# Patient Record
Sex: Female | Born: 1940
Health system: Southern US, Community
[De-identification: ages and names within clinical notes are randomized; demographics above are authoritative.]

## PROBLEM LIST (undated history)

## (undated) DIAGNOSIS — L570 Actinic keratosis: Secondary | ICD-10-CM

## (undated) DIAGNOSIS — IMO0001 Reserved for inherently not codable concepts without codable children: Secondary | ICD-10-CM

## (undated) DIAGNOSIS — R011 Cardiac murmur, unspecified: Secondary | ICD-10-CM

## (undated) DIAGNOSIS — Z794 Long term (current) use of insulin: Secondary | ICD-10-CM

## (undated) DIAGNOSIS — E119 Type 2 diabetes mellitus without complications: Secondary | ICD-10-CM

## (undated) DIAGNOSIS — D649 Anemia, unspecified: Secondary | ICD-10-CM

## (undated) DIAGNOSIS — E11319 Type 2 diabetes mellitus with unspecified diabetic retinopathy without macular edema: Secondary | ICD-10-CM

## (undated) DIAGNOSIS — M48062 Spinal stenosis, lumbar region with neurogenic claudication: Secondary | ICD-10-CM

## (undated) DIAGNOSIS — H409 Unspecified glaucoma: Secondary | ICD-10-CM

## (undated) DIAGNOSIS — I779 Disorder of arteries and arterioles, unspecified: Secondary | ICD-10-CM

## (undated) DIAGNOSIS — Z7982 Long term (current) use of aspirin: Secondary | ICD-10-CM

## (undated) DIAGNOSIS — D126 Benign neoplasm of colon, unspecified: Secondary | ICD-10-CM

## (undated) DIAGNOSIS — E669 Obesity, unspecified: Secondary | ICD-10-CM

## (undated) DIAGNOSIS — I700 Atherosclerosis of aorta: Secondary | ICD-10-CM

## (undated) DIAGNOSIS — E039 Hypothyroidism, unspecified: Secondary | ICD-10-CM

## (undated) DIAGNOSIS — Z952 Presence of prosthetic heart valve: Secondary | ICD-10-CM

## (undated) DIAGNOSIS — I350 Nonrheumatic aortic (valve) stenosis: Secondary | ICD-10-CM

## (undated) DIAGNOSIS — N189 Chronic kidney disease, unspecified: Secondary | ICD-10-CM

## (undated) DIAGNOSIS — K219 Gastro-esophageal reflux disease without esophagitis: Secondary | ICD-10-CM

## (undated) DIAGNOSIS — I10 Essential (primary) hypertension: Secondary | ICD-10-CM

## (undated) DIAGNOSIS — Z9842 Cataract extraction status, left eye: Secondary | ICD-10-CM

## (undated) DIAGNOSIS — E785 Hyperlipidemia, unspecified: Secondary | ICD-10-CM

## (undated) DIAGNOSIS — E1165 Type 2 diabetes mellitus with hyperglycemia: Secondary | ICD-10-CM

## (undated) DIAGNOSIS — L821 Other seborrheic keratosis: Secondary | ICD-10-CM

## (undated) DIAGNOSIS — M8580 Other specified disorders of bone density and structure, unspecified site: Secondary | ICD-10-CM

## (undated) HISTORY — DX: Type 2 diabetes mellitus with unspecified diabetic retinopathy without macular edema: E11.319

## (undated) HISTORY — DX: Obesity, unspecified: E66.9

## (undated) HISTORY — PX: DILATION AND CURETTAGE OF UTERUS: SHX78

## (undated) HISTORY — PX: CHOLECYSTECTOMY: SHX55

## (undated) HISTORY — PX: TUBAL LIGATION: SHX77

## (undated) HISTORY — DX: Hyperlipidemia, unspecified: E78.5

## (undated) HISTORY — PX: TONSILLECTOMY: SUR1361

## (undated) HISTORY — PX: LAPAROSCOPIC TUBAL LIGATION: SUR803

## (undated) HISTORY — DX: Anemia, unspecified: D64.9

## (undated) HISTORY — DX: Essential (primary) hypertension: I10

## (undated) HISTORY — DX: Unspecified glaucoma: H40.9

## (undated) HISTORY — PX: EYE SURGERY: SHX253

## (undated) HISTORY — DX: Hypothyroidism, unspecified: E03.9

## (undated) HISTORY — PX: CATARACT EXTRACTION: SUR2

---

## 1965-12-04 HISTORY — PX: CHOLECYSTECTOMY: SHX55

## 1972-12-04 HISTORY — PX: TONSILLECTOMY: SUR1361

## 2004-12-08 ENCOUNTER — Ambulatory Visit: Payer: Self-pay | Admitting: Unknown Physician Specialty

## 2005-04-21 ENCOUNTER — Ambulatory Visit: Payer: Self-pay | Admitting: Unknown Physician Specialty

## 2005-12-28 ENCOUNTER — Ambulatory Visit: Payer: Self-pay | Admitting: Unknown Physician Specialty

## 2007-01-04 ENCOUNTER — Ambulatory Visit: Payer: Self-pay | Admitting: Unknown Physician Specialty

## 2008-01-31 ENCOUNTER — Ambulatory Visit: Payer: Self-pay | Admitting: Unknown Physician Specialty

## 2008-08-01 ENCOUNTER — Emergency Department: Payer: Self-pay | Admitting: Emergency Medicine

## 2008-08-01 ENCOUNTER — Other Ambulatory Visit: Payer: Self-pay

## 2008-12-04 HISTORY — PX: CATARACT EXTRACTION: SUR2

## 2009-01-14 ENCOUNTER — Ambulatory Visit: Payer: Self-pay | Admitting: Family Medicine

## 2009-02-04 ENCOUNTER — Ambulatory Visit: Payer: Self-pay | Admitting: Unknown Physician Specialty

## 2009-06-30 ENCOUNTER — Emergency Department: Payer: Self-pay | Admitting: Emergency Medicine

## 2009-10-20 ENCOUNTER — Ambulatory Visit: Payer: Self-pay | Admitting: Ophthalmology

## 2010-03-04 ENCOUNTER — Ambulatory Visit: Payer: Self-pay | Admitting: Unknown Physician Specialty

## 2010-03-31 ENCOUNTER — Ambulatory Visit: Payer: Self-pay | Admitting: Family Medicine

## 2010-08-25 ENCOUNTER — Ambulatory Visit: Payer: Self-pay | Admitting: Family Medicine

## 2010-09-02 ENCOUNTER — Ambulatory Visit: Payer: Self-pay | Admitting: Unknown Physician Specialty

## 2010-09-02 LAB — HM COLONOSCOPY

## 2010-09-21 ENCOUNTER — Ambulatory Visit: Payer: Self-pay | Admitting: Family Medicine

## 2011-03-01 ENCOUNTER — Ambulatory Visit: Payer: Self-pay

## 2011-03-02 ENCOUNTER — Emergency Department: Payer: Self-pay | Admitting: Emergency Medicine

## 2011-03-28 ENCOUNTER — Ambulatory Visit: Payer: Self-pay | Admitting: Family Medicine

## 2011-05-11 ENCOUNTER — Emergency Department: Payer: Self-pay | Admitting: Unknown Physician Specialty

## 2011-08-18 ENCOUNTER — Ambulatory Visit: Payer: Self-pay | Admitting: Family Medicine

## 2013-09-18 ENCOUNTER — Ambulatory Visit: Payer: Self-pay | Admitting: Family Medicine

## 2014-02-17 DIAGNOSIS — E039 Hypothyroidism, unspecified: Secondary | ICD-10-CM | POA: Insufficient documentation

## 2014-09-24 LAB — BASIC METABOLIC PANEL
BUN: 18 mg/dL (ref 4–21)
Creatinine: 1.2 mg/dL — AB (ref 0.5–1.1)
GLUCOSE: 134 mg/dL
SODIUM: 140 mmol/L (ref 137–147)

## 2014-09-24 LAB — LIPID PANEL
Cholesterol: 147 mg/dL (ref 0–200)
HDL: 40 mg/dL (ref 35–70)
LDL CALC: 66 mg/dL
LDL/HDL RATIO: 1.7
Triglycerides: 203 mg/dL — AB (ref 40–160)

## 2014-09-24 LAB — TSH: TSH: 4.09 u[IU]/mL (ref 0.41–5.90)

## 2014-11-19 DIAGNOSIS — H3500 Unspecified background retinopathy: Secondary | ICD-10-CM | POA: Insufficient documentation

## 2014-11-19 DIAGNOSIS — N183 Chronic kidney disease, stage 3 unspecified: Secondary | ICD-10-CM | POA: Insufficient documentation

## 2014-11-19 DIAGNOSIS — Z794 Long term (current) use of insulin: Secondary | ICD-10-CM

## 2014-11-19 HISTORY — DX: Chronic kidney disease, stage 3 unspecified: N18.30

## 2014-11-19 HISTORY — DX: Long term (current) use of insulin: Z79.4

## 2014-11-19 HISTORY — DX: Unspecified background retinopathy: H35.00

## 2014-12-08 DIAGNOSIS — E11329 Type 2 diabetes mellitus with mild nonproliferative diabetic retinopathy without macular edema: Secondary | ICD-10-CM | POA: Diagnosis not present

## 2014-12-08 DIAGNOSIS — H4011X2 Primary open-angle glaucoma, moderate stage: Secondary | ICD-10-CM | POA: Diagnosis not present

## 2014-12-17 DIAGNOSIS — I10 Essential (primary) hypertension: Secondary | ICD-10-CM | POA: Diagnosis not present

## 2014-12-17 DIAGNOSIS — E782 Mixed hyperlipidemia: Secondary | ICD-10-CM | POA: Diagnosis not present

## 2014-12-17 DIAGNOSIS — I350 Nonrheumatic aortic (valve) stenosis: Secondary | ICD-10-CM | POA: Diagnosis not present

## 2014-12-17 DIAGNOSIS — R0602 Shortness of breath: Secondary | ICD-10-CM | POA: Diagnosis not present

## 2015-01-07 DIAGNOSIS — R0602 Shortness of breath: Secondary | ICD-10-CM | POA: Diagnosis not present

## 2015-01-07 DIAGNOSIS — E782 Mixed hyperlipidemia: Secondary | ICD-10-CM | POA: Diagnosis not present

## 2015-01-07 DIAGNOSIS — I10 Essential (primary) hypertension: Secondary | ICD-10-CM | POA: Diagnosis not present

## 2015-01-07 DIAGNOSIS — I350 Nonrheumatic aortic (valve) stenosis: Secondary | ICD-10-CM | POA: Diagnosis not present

## 2015-01-08 DIAGNOSIS — J40 Bronchitis, not specified as acute or chronic: Secondary | ICD-10-CM | POA: Diagnosis not present

## 2015-01-08 DIAGNOSIS — E119 Type 2 diabetes mellitus without complications: Secondary | ICD-10-CM | POA: Diagnosis not present

## 2015-01-13 ENCOUNTER — Ambulatory Visit: Payer: Self-pay | Admitting: Family Medicine

## 2015-01-13 DIAGNOSIS — R05 Cough: Secondary | ICD-10-CM | POA: Diagnosis not present

## 2015-01-13 DIAGNOSIS — I350 Nonrheumatic aortic (valve) stenosis: Secondary | ICD-10-CM | POA: Diagnosis not present

## 2015-01-13 DIAGNOSIS — J40 Bronchitis, not specified as acute or chronic: Secondary | ICD-10-CM | POA: Diagnosis not present

## 2015-01-13 DIAGNOSIS — E119 Type 2 diabetes mellitus without complications: Secondary | ICD-10-CM | POA: Diagnosis not present

## 2015-01-13 DIAGNOSIS — R062 Wheezing: Secondary | ICD-10-CM | POA: Diagnosis not present

## 2015-01-21 ENCOUNTER — Ambulatory Visit: Payer: Self-pay | Admitting: Internal Medicine

## 2015-01-21 DIAGNOSIS — E785 Hyperlipidemia, unspecified: Secondary | ICD-10-CM | POA: Diagnosis not present

## 2015-01-21 DIAGNOSIS — E039 Hypothyroidism, unspecified: Secondary | ICD-10-CM | POA: Diagnosis not present

## 2015-01-21 DIAGNOSIS — Z833 Family history of diabetes mellitus: Secondary | ICD-10-CM | POA: Diagnosis not present

## 2015-01-21 DIAGNOSIS — Z7982 Long term (current) use of aspirin: Secondary | ICD-10-CM | POA: Diagnosis not present

## 2015-01-21 DIAGNOSIS — E669 Obesity, unspecified: Secondary | ICD-10-CM | POA: Diagnosis not present

## 2015-01-21 DIAGNOSIS — I350 Nonrheumatic aortic (valve) stenosis: Secondary | ICD-10-CM | POA: Diagnosis not present

## 2015-01-21 DIAGNOSIS — Z885 Allergy status to narcotic agent status: Secondary | ICD-10-CM | POA: Diagnosis not present

## 2015-01-21 DIAGNOSIS — Z8249 Family history of ischemic heart disease and other diseases of the circulatory system: Secondary | ICD-10-CM | POA: Diagnosis not present

## 2015-01-21 DIAGNOSIS — E11319 Type 2 diabetes mellitus with unspecified diabetic retinopathy without macular edema: Secondary | ICD-10-CM | POA: Diagnosis not present

## 2015-01-21 DIAGNOSIS — Z818 Family history of other mental and behavioral disorders: Secondary | ICD-10-CM | POA: Diagnosis not present

## 2015-01-21 DIAGNOSIS — Z794 Long term (current) use of insulin: Secondary | ICD-10-CM | POA: Diagnosis not present

## 2015-01-21 DIAGNOSIS — I209 Angina pectoris, unspecified: Secondary | ICD-10-CM | POA: Diagnosis not present

## 2015-01-21 DIAGNOSIS — N289 Disorder of kidney and ureter, unspecified: Secondary | ICD-10-CM | POA: Diagnosis not present

## 2015-01-21 DIAGNOSIS — Z9049 Acquired absence of other specified parts of digestive tract: Secondary | ICD-10-CM | POA: Diagnosis not present

## 2015-01-21 DIAGNOSIS — Z881 Allergy status to other antibiotic agents status: Secondary | ICD-10-CM | POA: Diagnosis not present

## 2015-01-21 DIAGNOSIS — I10 Essential (primary) hypertension: Secondary | ICD-10-CM | POA: Diagnosis not present

## 2015-01-21 DIAGNOSIS — Z882 Allergy status to sulfonamides status: Secondary | ICD-10-CM | POA: Diagnosis not present

## 2015-01-21 DIAGNOSIS — Z79899 Other long term (current) drug therapy: Secondary | ICD-10-CM | POA: Diagnosis not present

## 2015-01-21 DIAGNOSIS — Z9849 Cataract extraction status, unspecified eye: Secondary | ICD-10-CM | POA: Diagnosis not present

## 2015-01-21 DIAGNOSIS — Z803 Family history of malignant neoplasm of breast: Secondary | ICD-10-CM | POA: Diagnosis not present

## 2015-01-26 DIAGNOSIS — I2510 Atherosclerotic heart disease of native coronary artery without angina pectoris: Secondary | ICD-10-CM

## 2015-01-26 HISTORY — DX: Atherosclerotic heart disease of native coronary artery without angina pectoris: I25.10

## 2015-02-01 DIAGNOSIS — I2510 Atherosclerotic heart disease of native coronary artery without angina pectoris: Secondary | ICD-10-CM | POA: Diagnosis not present

## 2015-02-01 DIAGNOSIS — E782 Mixed hyperlipidemia: Secondary | ICD-10-CM | POA: Diagnosis not present

## 2015-02-01 DIAGNOSIS — I350 Nonrheumatic aortic (valve) stenosis: Secondary | ICD-10-CM | POA: Diagnosis not present

## 2015-02-01 DIAGNOSIS — I10 Essential (primary) hypertension: Secondary | ICD-10-CM | POA: Diagnosis not present

## 2015-02-05 DIAGNOSIS — N183 Chronic kidney disease, stage 3 (moderate): Secondary | ICD-10-CM | POA: Diagnosis not present

## 2015-02-05 DIAGNOSIS — E1122 Type 2 diabetes mellitus with diabetic chronic kidney disease: Secondary | ICD-10-CM | POA: Diagnosis not present

## 2015-02-12 DIAGNOSIS — Z794 Long term (current) use of insulin: Secondary | ICD-10-CM | POA: Diagnosis not present

## 2015-02-12 DIAGNOSIS — E1122 Type 2 diabetes mellitus with diabetic chronic kidney disease: Secondary | ICD-10-CM | POA: Diagnosis not present

## 2015-02-12 DIAGNOSIS — N183 Chronic kidney disease, stage 3 (moderate): Secondary | ICD-10-CM | POA: Diagnosis not present

## 2015-02-25 DIAGNOSIS — Z23 Encounter for immunization: Secondary | ICD-10-CM | POA: Diagnosis not present

## 2015-02-25 DIAGNOSIS — Z0000 Encounter for general adult medical examination without abnormal findings: Secondary | ICD-10-CM | POA: Diagnosis not present

## 2015-03-18 DIAGNOSIS — N2581 Secondary hyperparathyroidism of renal origin: Secondary | ICD-10-CM | POA: Diagnosis not present

## 2015-03-18 DIAGNOSIS — E1122 Type 2 diabetes mellitus with diabetic chronic kidney disease: Secondary | ICD-10-CM | POA: Diagnosis not present

## 2015-03-18 DIAGNOSIS — N183 Chronic kidney disease, stage 3 (moderate): Secondary | ICD-10-CM | POA: Diagnosis not present

## 2015-03-18 DIAGNOSIS — D631 Anemia in chronic kidney disease: Secondary | ICD-10-CM | POA: Diagnosis not present

## 2015-03-18 DIAGNOSIS — I10 Essential (primary) hypertension: Secondary | ICD-10-CM | POA: Diagnosis not present

## 2015-04-02 DIAGNOSIS — H01001 Unspecified blepharitis right upper eyelid: Secondary | ICD-10-CM | POA: Diagnosis not present

## 2015-04-29 DIAGNOSIS — I10 Essential (primary) hypertension: Secondary | ICD-10-CM | POA: Insufficient documentation

## 2015-05-06 DIAGNOSIS — E782 Mixed hyperlipidemia: Secondary | ICD-10-CM | POA: Diagnosis not present

## 2015-05-06 DIAGNOSIS — I10 Essential (primary) hypertension: Secondary | ICD-10-CM | POA: Diagnosis not present

## 2015-05-06 DIAGNOSIS — I350 Nonrheumatic aortic (valve) stenosis: Secondary | ICD-10-CM | POA: Diagnosis not present

## 2015-05-06 DIAGNOSIS — I2510 Atherosclerotic heart disease of native coronary artery without angina pectoris: Secondary | ICD-10-CM | POA: Diagnosis not present

## 2015-05-10 DIAGNOSIS — I350 Nonrheumatic aortic (valve) stenosis: Secondary | ICD-10-CM | POA: Insufficient documentation

## 2015-05-11 ENCOUNTER — Other Ambulatory Visit: Payer: Self-pay | Admitting: *Deleted

## 2015-05-11 ENCOUNTER — Encounter: Payer: Self-pay | Admitting: Thoracic Surgery (Cardiothoracic Vascular Surgery)

## 2015-05-11 ENCOUNTER — Encounter: Payer: Self-pay | Admitting: *Deleted

## 2015-05-11 ENCOUNTER — Institutional Professional Consult (permissible substitution) (INDEPENDENT_AMBULATORY_CARE_PROVIDER_SITE_OTHER): Payer: Medicare Other | Admitting: Thoracic Surgery (Cardiothoracic Vascular Surgery)

## 2015-05-11 VITALS — BP 176/68 | HR 71 | Resp 16 | Ht 66.0 in | Wt 189.0 lb

## 2015-05-11 DIAGNOSIS — I350 Nonrheumatic aortic (valve) stenosis: Secondary | ICD-10-CM | POA: Diagnosis not present

## 2015-05-11 DIAGNOSIS — R0602 Shortness of breath: Secondary | ICD-10-CM

## 2015-05-11 NOTE — Patient Instructions (Signed)
The patient should continue all previous medications without changes at this time  Schedule echocardiogram, dobutamine stress echocardiogram and cardiac gated CT angiogram of the heart as soon as practical

## 2015-05-11 NOTE — Progress Notes (Signed)
[address]       [place] [id number]             [phone]     CARDIOTHORACIC SURGERY CONSULTATION REPORT  Referring Provider is Corey Skains, MD PCP is Wilhemena Durie, MD  Chief Complaint  Patient presents with  . Aortic Stenosis    Surgical eval, Cardiac Cath 01/21/15, ECHO 01/07/15    HPI:  Patient is a 74 year old moderately obese white female with aortic stenosis, hypertension, long-standing type II diabetes mellitus with complications, hyperlipidemia, and early stage II chronic kidney disease who has been referred to discuss treatment options for management of aortic stenosis.  The patient has been reported to have known about a heart murmur for many years. She has been followed for several years by Dr. Nehemiah Massed using serial transthoracic echocardiograms that have demonstrated the presence of aortic stenosis. Follow-up transthoracic echocardiogram performed 01/07/2015 revealed the presence of moderate to severe aortic stenosis. Peak velocity across the aortic valve was reported 3.3 m/s corresponding to a mean transvalvular gradient of 26 mmHg. There was moderate aortic insufficiency, moderate mitral regurgitation. The patient has moderate left ventricular hypertrophy with normal left ventricular systolic function and ejection fraction estimated greater than 55%. The patient reportedly underwent a stress test and was felt to have relatively poor exercise tolerance. Diagnostic cardiac catheterization was performed 01/26/2015 and was notable for the presence of diffuse mild nonobstructive coronary artery disease. Transvalvular gradient across the aortic valve was reported 30.1 mmHg corresponding to an estimated aortic valve area of 1.49 cm. The patient has been referred to discuss possible elective aortic valve replacement.  The patient is married and lives in [place] with her husband.  She has lived in the [place] area all of her life. 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 She has lived an independent lifestyle all of her life. She does not exercise regularly although she enjoys bowling once a week.  She reports no specific physical limitations and she states that her exercise tolerance has not changed from her perspective for several years. She admits to stable symptoms of chronic exertional shortness of breath and fatigue that seemed to be relatively mild, although she admittedly is not very active physically. She states that with normal activities around the house she experiences no significant dyspnea. She gets short of breath entire with more strenuous activity such as walking for prolonged periods of time or walking at a brisk pace. She denies any history of chest pain or chest tightness either with activity or at rest. She denies any history of PND, orthopnea, palpitations, or syncope. She sometimes feels dizzy when she bends over at the waist. She experiences no lower extremity edema.  Past Medical History  Diagnosis Date  . Diabetes mellitus   . Hyperlipidemia   . Hypertension   . Hypothyroidism   . Aortic stenosis   . DR (diabetic retinopathy)   . Glaucoma   . Obesity   . Long-term insulin use   . Microalbuminuria   . Renal insufficiency     Past Surgical History  Procedure Laterality Date  . Dilation and curettage of uterus    . Laparoscopic tubal ligation    . Cholecystectomy      1967  . Tonsillectomy      1974  . Cataract extraction      left 2010    Family History  Problem Relation Age of  Onset  . Alzheimer's disease Mother   . Heart attack Father   . Coronary artery disease Father   . Diabetes Father   . Breast cancer Sister     History   Social History  . Marital Status: Married    Spouse Name: N/A  . Number of Children: N/A  . Years of Education: N/A   Occupational History  . Not on file.   Social History Main Topics  . Smoking status:  Never Smoker   . Smokeless tobacco: Never Used  . Alcohol Use: No  . Drug Use: No  . Sexual Activity: Not on file   Other Topics Concern  . Not on file   Social History Narrative    Current Outpatient Prescriptions  Medication Sig Dispense Refill  . amitriptyline (ELAVIL) 10 MG tablet Take 10 mg by mouth at bedtime.    Marland Kitchen amLODipine (NORVASC) 5 MG tablet Take 5 mg by mouth daily.    Marland Kitchen aspirin 81 MG tablet Take 81 mg by mouth daily.    Marland Kitchen atorvastatin (LIPITOR) 20 MG tablet Take 20 mg by mouth daily. Received from external pharmacy.    . carvedilol (COREG) 25 MG tablet     . dorzolamide-timolol (COSOPT) 22.3-6.8 MG/ML ophthalmic solution     . insulin detemir (LEVEMIR) 100 UNIT/ML injection Inject into the skin 2 (two) times daily. 30 units in the am and 80 units at hs    . latanoprost (XALATAN) 0.005 % ophthalmic solution 1 drop at bedtime.    Marland Kitchen levothyroxine (SYNTHROID, LEVOTHROID) 100 MCG tablet Take 100 mcg by mouth daily before breakfast.    . losartan (COZAAR) 100 MG tablet Take 100 mg by mouth daily.    . metFORMIN (GLUCOPHAGE) 1000 MG tablet Take 2,500 mg by mouth at bedtime.     Marland Kitchen NOVOLOG 100 UNIT/ML injection 30 Units. Am and pm  5  . omeprazole (PRILOSEC) 20 MG capsule Take 20 mg by mouth daily.    . diphenhydrAMINE (BENADRYL) 25 MG tablet Take 25 mg by mouth every 6 (six) hours as needed.     No current facility-administered medications for this visit.    Allergies  Allergen Reactions  . Azithromycin   . Codeine   . Penicillins   . Sulfa Antibiotics       Review of Systems:   General:  normal appetite, normal energy, no weight gain, no weight loss, no fever  Cardiac:  no chest pain with exertion, no chest pain at rest, + SOB with strenuous exertion, no resting SOB, no PND, no orthopnea, no palpitations, no arrhythmia, no atrial fibrillation, no LE edema, occasional dizzy spells, no syncope  Respiratory:  no shortness of breath, no home oxygen, no productive  cough, intermittent dry cough in the mornings, no bronchitis, no wheezing, no hemoptysis, no asthma, no pain with inspiration or cough, no sleep apnea, no CPAP at night  GI:   no difficulty swallowing, + reflux, no frequent heartburn, no hiatal hernia, no abdominal pain, no constipation, no diarrhea, no hematochezia, no hematemesis, no melena  GU:   no dysuria,  no frequency, no urinary tract infection, no hematuria, no kidney stones, mild chronic kidney disease  Vascular:  no pain suggestive of claudication, + chronic pain in feet worse at night, no leg cramps, no varicose veins, no DVT, no non-healing foot ulcer  Neuro:   no stroke, no TIA's, no seizures, no headaches, no temporary blindness one eye,  no slurred speech, + peripheral neuropathy, +  chronic pain, no instability of gait, no memory/cognitive dysfunction  Musculoskeletal: no arthritis, no joint swelling, no myalgias, no difficulty walking, normal mobility   Skin:   no rash, no itching, no skin infections, no pressure sores or ulcerations  Psych:   no anxiety, no depression, no nervousness, no unusual recent stress  Eyes:   + blurry vision, no floaters, no recent vision changes, + wears glasses   ENT:   no hearing loss, no loose or painful teeth, no dentures, last saw dentist within the past year  Hematologic:  no easy bruising, no abnormal bleeding, no clotting disorder, no frequent epistaxis  Endocrine:  + diabetes, routinely checks CBG's at home, most recent Hgb a1c 7.4     Physical Exam:   BP 176/68 mmHg  Pulse 71  Resp 16  Ht 5\' 6"  (1.676 m)  Wt 189 lb (85.73 kg)  BMI 30.52 kg/m2  SpO2 98%  General:  Moderately obese but o/w  well-appearing  HEENT:  Unremarkable   Neck:   no JVD, no bruits, no adenopathy   Chest:   clear to auscultation, symmetrical breath sounds, no wheezes, no rhonchi   CV:   RRR, grade III/VI crescendo/decrescendo systolic murmur   Abdomen:  soft, non-tender, no masses   Extremities:  warm,  well-perfused, pulses diminished but palpable, no LE edema  Rectal/GU  Deferred  Neuro:   Grossly non-focal and symmetrical throughout  Skin:   Clean and dry, no rashes, no breakdown   Diagnostic Tests:  TRANSTHORACIC ECHOCARDIOGRAM  Both the report and images from transthoracic echocardiogram performed 01/07/2015 at the [facility] are reviewed. The patient is reported to have severe aortic valve stenosis. Peak velocity across the aortic valve was reported 3.34 m/s corresponding to a mean transvalvular gradient of 26 mmHg respectively.  Ejection fraction was reported greater than 55%. There was reported moderate aortic insufficiency and moderate mitral regurgitation.   CARDIAC CATHETERIZATION  Both the report and images from diagnostic cardiac catheterization performed 01/26/2015 are reviewed. The patient has diffuse nonobstructive coronary artery disease. There is left dominant coronary circulation.  The transvalvular gradient across the aortic valve was reported 30 mmHg corresponding to a calculated aortic valve area of 1.49 cm. Baseline thick cardiac output was 7.2 L/m. Pulmonary artery pressure measured 38/16 with a mean pulmonary capillary wedge pressure 17 and central venous pressure 9    Impression:  Patient has at least moderate and possibly severe aortic stenosis. I have personally reviewed the patient's most recent transthoracic echocardiogram and diagnostic cardiac catheterization. Images from the patient's most recent transthoracic echocardiogram are suboptimal due to marginal sound transmission.  However, there is some degree of thickening and fibrosis involving all 3 leaflets of the aortic valve. Leaflet mobility appears somewhat restricted, although in some views the leaflets seem to be moving modestly well. Peak velocity across the aortic valve measured between 3.3 and 3.5 m/s. After reviewing all of the transvalvular Doppler spectra the highest peak velocity was recorded  3.5 m/s corresponding to peak and mean transvalvular gradient estimated at 49 and 28 mmHg, respectively. The left ventricular output tract diameter was somewhat small, measuring 1.8 cm. The peak velocity across the left ventricular outflow tract was somewhat high, measured 0.86 m/s.  Although not reported, to my calculation the dimensionless velocity ratio from LV outflow tract to aortic valve would be 0.25. The patient has moderate left ventricular hypertrophy with normal left ventricular systolic function.  Cardiac catheterization is notable for the presence of diffuse mild nonobstructive  coronary artery disease. Transvalvular gradient across the aortic valve was reported 30 mmHg at catheterization with calculated aortic valve area 1.49 cm.    The patient describes stable symptoms of mild exertional shortness of breath and fatigue consistent with chronic diastolic congestive heart failure, New York Heart Association functional class I-II.  The patient reportedly demonstrated relatively poor exercise tolerance on recent stress test, although the official report from this test is not currently available for review.  I agree that the patient might benefit from elective aortic valve replacement. However, I am concerned by the fact that her transvalvular gradients are more consistent with moderate aortic stenosis and her left ventricular outflow tract diameter may be relatively small.  Replacement of her aortic valve using conventional or minimally invasive surgical techniques without aortic root enlargement might leave her without much improvement in her transvalvular gradient.    Plan:  The patient and her family were counseled at length regarding treatment alternatives for management of severe aortic stenosis including continued medical therapy versus proceeding with aortic valve replacement in the near future.  The natural history of aortic stenosis was reviewed, as was long term prognosis with medical  therapy alone.  Surgical options were discussed at length including conventional surgical aortic valve replacement using either a mechanical prosthesis or a bioprosthetic tissue valve through either a conventional median sternotomy or using minimally invasive techniques.  Other alternatives including transcatheter aortic valve replacement were discussed.    We will obtain a follow-up transthoracic echocardiogram and dobutamine stress echocardiogram to reevaluate the severity and functional significance of the patient's aortic stenosis. We will also obtain a cardiac gated CT angiogram of the heart to more precisely evaluate the size of the aortic root and aortic annulus.  The patient will return in 3 weeks to review the results of these tests and discuss treatment options further.   I spent in excess of 90 minutes during the conduct of this office consultation and >50% of this time involved direct face-to-face encounter with the patient for counseling and/or coordination of their care.   Valentina Gu. Roxy Manns, MD 05/11/2015 12:52 PM

## 2015-05-12 ENCOUNTER — Other Ambulatory Visit: Payer: Self-pay | Admitting: *Deleted

## 2015-05-12 DIAGNOSIS — I350 Nonrheumatic aortic (valve) stenosis: Secondary | ICD-10-CM | POA: Diagnosis not present

## 2015-05-12 DIAGNOSIS — I359 Nonrheumatic aortic valve disorder, unspecified: Secondary | ICD-10-CM | POA: Diagnosis not present

## 2015-05-12 DIAGNOSIS — N289 Disorder of kidney and ureter, unspecified: Secondary | ICD-10-CM | POA: Diagnosis not present

## 2015-05-13 ENCOUNTER — Other Ambulatory Visit: Payer: Self-pay | Admitting: *Deleted

## 2015-05-13 ENCOUNTER — Telehealth: Payer: Self-pay

## 2015-05-13 DIAGNOSIS — N289 Disorder of kidney and ureter, unspecified: Secondary | ICD-10-CM

## 2015-05-13 NOTE — Telephone Encounter (Signed)
Per Dr. Aundra Dubin, "Please arrange for this patient to get a full echo + low dose dobutamine stress echo for aortic stenosis. Would like it done by [facility]. Please have me paged to read echo when she is getting started with it."  Patient scheduled for ECHO 6/27 at 1400. Patient scheduled for dobutamine stress ECHO 6/27 at 1500. Lorriane Shire will do both. Will have her page Dr. Aundra Dubin to read when starting.

## 2015-05-14 DIAGNOSIS — N183 Chronic kidney disease, stage 3 (moderate): Secondary | ICD-10-CM | POA: Diagnosis not present

## 2015-05-14 DIAGNOSIS — E1122 Type 2 diabetes mellitus with diabetic chronic kidney disease: Secondary | ICD-10-CM | POA: Diagnosis not present

## 2015-05-15 LAB — COMPLETE METABOLIC PANEL WITH GFR
ALT: 15 U/L (ref 0–35)
AST: 18 U/L (ref 0–37)
Albumin: 3.9 g/dL (ref 3.5–5.2)
Alkaline Phosphatase: 72 U/L (ref 39–117)
BUN: 17 mg/dL (ref 6–23)
CHLORIDE: 105 meq/L (ref 96–112)
CO2: 19 meq/L (ref 19–32)
CREATININE: 1.08 mg/dL (ref 0.50–1.10)
Calcium: 9.7 mg/dL (ref 8.4–10.5)
GFR, EST AFRICAN AMERICAN: 58 mL/min — AB
GFR, EST NON AFRICAN AMERICAN: 50 mL/min — AB
Glucose, Bld: 138 mg/dL — ABNORMAL HIGH (ref 70–99)
Potassium: 4.7 mEq/L (ref 3.5–5.3)
SODIUM: 144 meq/L (ref 135–145)
Total Bilirubin: 0.3 mg/dL (ref 0.2–1.2)
Total Protein: 6.5 g/dL (ref 6.0–8.3)

## 2015-05-20 ENCOUNTER — Ambulatory Visit (HOSPITAL_COMMUNITY)
Admission: RE | Admit: 2015-05-20 | Discharge: 2015-05-20 | Disposition: A | Discharge status: Home / Self Care | Payer: Medicare Other | Source: Ambulatory Visit | Attending: Thoracic Surgery (Cardiothoracic Vascular Surgery) | Admitting: Thoracic Surgery (Cardiothoracic Vascular Surgery)

## 2015-05-20 ENCOUNTER — Other Ambulatory Visit (HOSPITAL_COMMUNITY): Payer: Medicare Other

## 2015-05-20 ENCOUNTER — Other Ambulatory Visit: Payer: Self-pay | Admitting: Cardiology

## 2015-05-20 VITALS — BP 191/52 | HR 62

## 2015-05-20 DIAGNOSIS — I350 Nonrheumatic aortic (valve) stenosis: Secondary | ICD-10-CM | POA: Diagnosis not present

## 2015-05-20 DIAGNOSIS — N289 Disorder of kidney and ureter, unspecified: Secondary | ICD-10-CM | POA: Diagnosis not present

## 2015-05-20 LAB — GLUCOSE, CAPILLARY: Glucose-Capillary: 159 mg/dL — ABNORMAL HIGH (ref 65–99)

## 2015-05-20 MED ORDER — SODIUM BICARBONATE 8.4 % IV SOLN
INTRAVENOUS | Status: DC
  Filled 2015-05-20: qty 500

## 2015-05-20 MED ORDER — SODIUM BICARBONATE BOLUS VIA INFUSION
INTRAVENOUS | Status: DC
  Filled 2015-05-20: qty 1

## 2015-05-21 DIAGNOSIS — E1122 Type 2 diabetes mellitus with diabetic chronic kidney disease: Secondary | ICD-10-CM | POA: Diagnosis not present

## 2015-05-21 DIAGNOSIS — R809 Proteinuria, unspecified: Secondary | ICD-10-CM | POA: Diagnosis not present

## 2015-05-21 DIAGNOSIS — I10 Essential (primary) hypertension: Secondary | ICD-10-CM | POA: Diagnosis not present

## 2015-05-21 DIAGNOSIS — H3500 Unspecified background retinopathy: Secondary | ICD-10-CM | POA: Diagnosis not present

## 2015-05-24 ENCOUNTER — Other Ambulatory Visit: Payer: Self-pay | Admitting: *Deleted

## 2015-05-24 DIAGNOSIS — N289 Disorder of kidney and ureter, unspecified: Secondary | ICD-10-CM

## 2015-05-24 DIAGNOSIS — I350 Nonrheumatic aortic (valve) stenosis: Secondary | ICD-10-CM

## 2015-05-24 MED ORDER — SODIUM BICARBONATE 8.4 % IV SOLN: INTRAVENOUS | Status: AC

## 2015-05-24 MED ORDER — SODIUM BICARBONATE BOLUS VIA INFUSION: INTRAVENOUS | Status: AC

## 2015-05-26 ENCOUNTER — Telehealth (HOSPITAL_COMMUNITY): Payer: Self-pay | Admitting: *Deleted

## 2015-05-26 NOTE — Telephone Encounter (Signed)
Left message on voicemail in reference to upcoming appointment scheduled for 05/31/15. Phone number given for a call back so details instructions can be given. Hubbard Robinson, RN

## 2015-05-28 ENCOUNTER — Other Ambulatory Visit: Payer: Self-pay | Admitting: Family Medicine

## 2015-05-28 ENCOUNTER — Telehealth (HOSPITAL_COMMUNITY): Payer: Self-pay

## 2015-05-28 NOTE — Telephone Encounter (Signed)
Left message on voicemail and spoke with husband in reference to upcoming appointment scheduled for 05-31-2015. Phone number given for a call back so details instructions can be given. Stacey Pierce, [name] A

## 2015-05-29 ENCOUNTER — Other Ambulatory Visit: Payer: Self-pay | Admitting: Family Medicine

## 2015-05-31 ENCOUNTER — Ambulatory Visit (HOSPITAL_COMMUNITY): Payer: Medicare Other | Attending: Thoracic Surgery (Cardiothoracic Vascular Surgery)

## 2015-05-31 ENCOUNTER — Ambulatory Visit (HOSPITAL_COMMUNITY): Payer: Medicare Other

## 2015-05-31 ENCOUNTER — Ambulatory Visit (HOSPITAL_BASED_OUTPATIENT_CLINIC_OR_DEPARTMENT_OTHER): Payer: Medicare Other

## 2015-05-31 ENCOUNTER — Other Ambulatory Visit: Payer: Self-pay

## 2015-05-31 DIAGNOSIS — I350 Nonrheumatic aortic (valve) stenosis: Secondary | ICD-10-CM

## 2015-05-31 DIAGNOSIS — R5383 Other fatigue: Secondary | ICD-10-CM | POA: Diagnosis not present

## 2015-05-31 DIAGNOSIS — I359 Nonrheumatic aortic valve disorder, unspecified: Secondary | ICD-10-CM | POA: Diagnosis not present

## 2015-05-31 DIAGNOSIS — R0602 Shortness of breath: Secondary | ICD-10-CM

## 2015-05-31 DIAGNOSIS — I2510 Atherosclerotic heart disease of native coronary artery without angina pectoris: Secondary | ICD-10-CM | POA: Insufficient documentation

## 2015-05-31 DIAGNOSIS — E669 Obesity, unspecified: Secondary | ICD-10-CM | POA: Diagnosis not present

## 2015-05-31 DIAGNOSIS — Z6830 Body mass index (BMI) 30.0-30.9, adult: Secondary | ICD-10-CM | POA: Insufficient documentation

## 2015-05-31 DIAGNOSIS — R42 Dizziness and giddiness: Secondary | ICD-10-CM | POA: Insufficient documentation

## 2015-05-31 DIAGNOSIS — I10 Essential (primary) hypertension: Secondary | ICD-10-CM | POA: Insufficient documentation

## 2015-05-31 DIAGNOSIS — E119 Type 2 diabetes mellitus without complications: Secondary | ICD-10-CM | POA: Insufficient documentation

## 2015-05-31 MED ORDER — DOBUTAMINE INFUSION FOR EP/ECHO/NUC (1000 MCG/ML)
20.0000 ug/kg/min | Freq: Once | INTRAVENOUS | Status: AC
  Administered 2015-05-31: 20 ug/kg/min via INTRAVENOUS

## 2015-06-02 ENCOUNTER — Ambulatory Visit: Payer: Medicare Other | Admitting: Thoracic Surgery (Cardiothoracic Vascular Surgery)

## 2015-06-03 ENCOUNTER — Ambulatory Visit: Payer: Medicare Other | Admitting: Thoracic Surgery (Cardiothoracic Vascular Surgery)

## 2015-06-03 ENCOUNTER — Ambulatory Visit (HOSPITAL_COMMUNITY)
Admission: RE | Admit: 2015-06-03 | Discharge: 2015-06-03 | Disposition: A | Discharge status: Home / Self Care | Payer: Medicare Other | Source: Ambulatory Visit | Attending: Cardiology | Admitting: Cardiology

## 2015-06-03 ENCOUNTER — Encounter (HOSPITAL_COMMUNITY)
Admission: RE | Admit: 2015-06-03 | Discharge: 2015-06-03 | Disposition: A | Discharge status: Home / Self Care | Payer: Medicare Other | Source: Ambulatory Visit | Attending: Thoracic Surgery (Cardiothoracic Vascular Surgery) | Admitting: Thoracic Surgery (Cardiothoracic Vascular Surgery)

## 2015-06-03 ENCOUNTER — Encounter (HOSPITAL_COMMUNITY): Payer: Self-pay

## 2015-06-03 ENCOUNTER — Other Ambulatory Visit: Payer: Self-pay | Admitting: Cardiology

## 2015-06-03 ENCOUNTER — Ambulatory Visit (HOSPITAL_COMMUNITY)
Admission: RE | Admit: 2015-06-03 | Discharge: 2015-06-03 | Disposition: A | Discharge status: Home / Self Care | Payer: Medicare Other | Source: Ambulatory Visit | Attending: Thoracic Surgery (Cardiothoracic Vascular Surgery) | Admitting: Thoracic Surgery (Cardiothoracic Vascular Surgery)

## 2015-06-03 VITALS — BP 171/54 | HR 62 | Resp 17

## 2015-06-03 DIAGNOSIS — I517 Cardiomegaly: Secondary | ICD-10-CM | POA: Insufficient documentation

## 2015-06-03 DIAGNOSIS — N289 Disorder of kidney and ureter, unspecified: Secondary | ICD-10-CM

## 2015-06-03 DIAGNOSIS — I350 Nonrheumatic aortic (valve) stenosis: Secondary | ICD-10-CM

## 2015-06-03 DIAGNOSIS — I739 Peripheral vascular disease, unspecified: Secondary | ICD-10-CM | POA: Diagnosis not present

## 2015-06-03 DIAGNOSIS — Q231 Congenital insufficiency of aortic valve: Secondary | ICD-10-CM | POA: Diagnosis not present

## 2015-06-03 MED ORDER — SODIUM BICARBONATE 8.4 % IV SOLN
INTRAVENOUS | Status: AC
  Administered 2015-06-03: 09:00:00 via INTRAVENOUS
  Filled 2015-06-03: qty 500

## 2015-06-03 MED ORDER — SODIUM BICARBONATE BOLUS VIA INFUSION
INTRAVENOUS | Status: DC
  Filled 2015-06-03: qty 1

## 2015-06-03 MED ORDER — IOHEXOL 350 MG/ML SOLN
80.0000 mL | Freq: Once | INTRAVENOUS | Status: AC | PRN
  Administered 2015-06-03: 80 mL via INTRAVENOUS

## 2015-06-03 MED ORDER — LIDOCAINE HCL 1 % IJ SOLN
INTRAMUSCULAR | Status: AC
  Filled 2015-06-03: qty 20

## 2015-06-03 NOTE — Procedures (Signed)
Successful ultrasound-guided right basilic vein approach vascular sheath placement for temporary venous access.  Vascular sheath is ready for immediate use.

## 2015-06-03 NOTE — Progress Notes (Signed)
Right upper arm vascular sheath removed intact; area unremarkable

## 2015-06-04 ENCOUNTER — Other Ambulatory Visit: Payer: Self-pay | Admitting: Family Medicine

## 2015-06-08 ENCOUNTER — Other Ambulatory Visit: Payer: Self-pay | Admitting: *Deleted

## 2015-06-08 DIAGNOSIS — N289 Disorder of kidney and ureter, unspecified: Secondary | ICD-10-CM

## 2015-06-08 DIAGNOSIS — I350 Nonrheumatic aortic (valve) stenosis: Secondary | ICD-10-CM

## 2015-06-08 DIAGNOSIS — I359 Nonrheumatic aortic valve disorder, unspecified: Secondary | ICD-10-CM | POA: Diagnosis not present

## 2015-06-08 LAB — COMPLETE METABOLIC PANEL WITH GFR
ALT: 17 U/L (ref 0–35)
AST: 16 U/L (ref 0–37)
Albumin: 4 g/dL (ref 3.5–5.2)
Alkaline Phosphatase: 67 U/L (ref 39–117)
BILIRUBIN TOTAL: 0.3 mg/dL (ref 0.2–1.2)
BUN: 20 mg/dL (ref 6–23)
CO2: 23 meq/L (ref 19–32)
CREATININE: 1.13 mg/dL — AB (ref 0.50–1.10)
Calcium: 9.3 mg/dL (ref 8.4–10.5)
Chloride: 102 mEq/L (ref 96–112)
GFR, EST NON AFRICAN AMERICAN: 48 mL/min — AB
GFR, Est African American: 55 mL/min — ABNORMAL LOW
GLUCOSE: 144 mg/dL — AB (ref 70–99)
Potassium: 4.2 mEq/L (ref 3.5–5.3)
Sodium: 135 mEq/L (ref 135–145)
Total Protein: 6.6 g/dL (ref 6.0–8.3)

## 2015-06-10 DIAGNOSIS — H4011X2 Primary open-angle glaucoma, moderate stage: Secondary | ICD-10-CM | POA: Diagnosis not present

## 2015-06-14 ENCOUNTER — Encounter: Payer: Self-pay | Admitting: Thoracic Surgery (Cardiothoracic Vascular Surgery)

## 2015-06-14 ENCOUNTER — Ambulatory Visit (INDEPENDENT_AMBULATORY_CARE_PROVIDER_SITE_OTHER): Payer: Medicare Other | Admitting: Thoracic Surgery (Cardiothoracic Vascular Surgery)

## 2015-06-14 VITALS — BP 152/70 | HR 62 | Resp 16 | Ht 66.0 in | Wt 189.0 lb

## 2015-06-14 DIAGNOSIS — I350 Nonrheumatic aortic (valve) stenosis: Secondary | ICD-10-CM | POA: Diagnosis not present

## 2015-06-14 NOTE — Patient Instructions (Signed)
The patient should continue all previous medications without changes at this time  The patient should call her cardiologist and schedule f/u appointment and echocardiogram in 6 months or sooner should she develop symptoms of shortness of breath or fatigue with exertion.  The patient is reminded to make every effort to keep their diabetes under very tight control.  They should follow up closely with their primary care physician or endocrinologist and strive to keep their hemoglobin A1c levels as low as possible, preferably near or below 6.0

## 2015-06-14 NOTE — Progress Notes (Signed)
[address]       [place] [address]             [phone]     CARDIOTHORACIC SURGERY OFFICE NOTE  Referring Provider is Corey Skains, MD PCP is Wilhemena Durie, MD   HPI:  Patient returns for follow-up of aortic stenosis. She was originally seen in consultation on 05/11/2015. Since then she underwent follow-up echocardiogram with dobutamine stress echocardiography and cardiac gated CT angiogram of the heart. She returns to the office today with her family to discuss the results of these tests and further discuss treatment options. She reports no new problems or complaints over the last month. Specifically, the patient continues to deny any significant symptoms of exertional shortness of breath, chest discomfort, or fatigue. She does not exert herself strenuously on a regular basis but overall she reports no developing problems with exercise intolerance or limitations to her day-to-day activities. She states that she only rarely gets short of breath when she bends over at the waist.  The remainder of her review of systems are unchanged from previously.   Current Outpatient Prescriptions  Medication Sig Dispense Refill  . amitriptyline (ELAVIL) 10 MG tablet Take 10 mg by mouth at bedtime.    Marland Kitchen amLODipine (NORVASC) 5 MG tablet 1 TABLET TABLET, ORAL, DAILY 30 tablet 5  . aspirin 81 MG tablet Take 81 mg by mouth daily.    Marland Kitchen atorvastatin (LIPITOR) 20 MG tablet Take 20 mg by mouth daily. Received from external pharmacy.    . carvedilol (COREG) 25 MG tablet     . diphenhydrAMINE (BENADRYL) 25 MG tablet Take 25 mg by mouth every 6 (six) hours as needed.    . dorzolamide-timolol (COSOPT) 22.3-6.8 MG/ML ophthalmic solution     . hydrochlorothiazide (HYDRODIURIL) 25 MG tablet Take 25 mg by mouth daily.    . insulin detemir (LEVEMIR) 100 UNIT/ML injection Inject into the skin 2 (two) times daily. 30 units in the am and 80 units at hs    . latanoprost (XALATAN)  0.005 % ophthalmic solution 1 drop at bedtime.    Marland Kitchen levothyroxine (SYNTHROID, LEVOTHROID) 100 MCG tablet TAKE ONE TABLET BY MOUTH ONCE DAILY 30 tablet 0  . losartan (COZAAR) 100 MG tablet Take 100 mg by mouth daily.    . metFORMIN (GLUCOPHAGE) 1000 MG tablet Take 2,500 mg by mouth at bedtime.     Marland Kitchen NOVOLOG 100 UNIT/ML injection 30 Units. Am and pm  5  . omeprazole (PRILOSEC) 20 MG capsule Take 20 mg by mouth daily.     No current facility-administered medications for this visit.      Physical Exam:   BP 152/70 mmHg  Pulse 62  Resp 16  Ht 5\' 6"  (1.676 m)  Wt 189 lb (85.73 kg)  BMI 30.52 kg/m2  SpO2 98%  General:  Well-appearing  Chest:   clear  CV:   Regular rate and rhythm with systolic murmur heard best along the sternal border  Incisions:  n/a  Abdomen:  Soft and nontender  Extremities:  Warm and well-perfused  Diagnostic Tests:  Transthoracic Echocardiography  Patient:  Stacey Pierce, Stacey Pierce MR #:    [id number] Study Date: 05/31/2015 Gender:   F Age:    74 Height:   167.6 cm Weight:   85.9 kg BSA:    2.03 m^2 Pt. Status: Room:  SONOGRAPHER Victorio Palm, RDCS ATTENDING  Darylene Price, M.D. ORDERING   Darylene Price, M.D. PERFORMING  [facility]  cc:  ------------------------------------------------------------------- LV EF: 55% -  60%  ------------------------------------------------------------------- Indications:   AVD (I35.9).  ------------------------------------------------------------------- History:  PMH: Acquired from the patient and from the patient&'s chart. Fatigue, exertional dyspnea, and dizziness. Borderline significant aortic stenosis. Risk factors: Hypertension. Diabetes mellitus. Obese.  ------------------------------------------------------------------- Study Conclusions  - Left ventricle: The cavity size was normal. There was moderate concentric hypertrophy. Systolic function was normal.  The estimated ejection fraction was in the range of 55% to 60%. Wall motion was normal; there were no regional wall motion abnormalities. - Aortic valve: There was moderate to severe stenosis. Mean gradient (S): 35 mm Hg. Peak gradient (S): 58 mm Hg. Valve area (VTI): 1.04 cm^2. Valve area (Vmax): 0.96 cm^2. Valve area (Vmean): 0.98 cm^2. - Mitral valve: Moderately calcified annulus. - Left atrium: The atrium was mildly dilated.  ------------------------------------------------------------------- Labs, prior tests, procedures, and surgery: Echocardiography (February 2016).  The mitral valve showed moderate regurgitation. The aortic valve showed moderate regurgitation. EF was 55%. Aortic valve: mean gradient of 26 mm Hg.  Catheterization (February 2016).  The study demonstrated non-obstructive coronary artery disease. Transvalvular gradient of 30.1 mmHg across the aortic valve. Transthoracic echocardiography. M-mode, complete 2D, spectral Doppler, and color Doppler. Birthdate: Patient birthdate: 07-17-1941. Age: Patient is 74 yr old. Sex: Gender: female. BMI: 30.6 kg/m^2. Blood pressure:   130/54 Patient status: Outpatient. Study date: Study date: 05/31/2015. Study time: 02:08 PM. Location: [facility]  -------------------------------------------------------------------  ------------------------------------------------------------------- Left ventricle: The cavity size was normal. There was moderate concentric hypertrophy. Systolic function was normal. The estimated ejection fraction was in the range of 55% to 60%. Wall motion was normal; there were no regional wall motion abnormalities. Abnormal relaxation with increased filling pressures.  ------------------------------------------------------------------- Aortic valve: Poorly visualized. Moderately thickened, moderately calcified leaflets. Mobility was not restricted. Doppler:   There was moderate to severe stenosis.  There was no significant regurgitation.  VTI ratio of LVOT to aortic valve: 0.29. Valve area (VTI): 1.04 cm^2. Indexed valve area (VTI): 0.51 cm^2/m^2. Peak velocity ratio of LVOT to aortic valve: 0.27. Valve area (Vmax): 0.96 cm^2. Indexed valve area (Vmax): 0.47 cm^2/m^2. Mean velocity ratio of LVOT to aortic valve: 0.27. Valve area (Vmean): 0.98 cm^2. Indexed valve area (Vmean): 0.48 cm^2/m^2.  Mean gradient (S): 35 mm Hg. Peak gradient (S): 58 mm Hg.  ------------------------------------------------------------------- Aorta: Aortic root: The aortic root was normal in size. Ascending aorta: The ascending aorta was normal in size.  ------------------------------------------------------------------- Mitral valve:  Moderately calcified annulus. Leaflet separation was normal. Mobility was not restricted. Doppler: Transvalvular velocity was within the normal range. There was no evidence for stenosis. There was no regurgitation.  Peak gradient (D): 5 mm Hg.  ------------------------------------------------------------------- Left atrium: The atrium was mildly dilated.  ------------------------------------------------------------------- Right ventricle: The cavity size was normal. Wall thickness was normal. Systolic function was normal.  ------------------------------------------------------------------- Pulmonic valve:  Poorly visualized. Doppler: Transvalvular velocity was within the normal range. There was no evidence for stenosis. There was no significant regurgitation.  ------------------------------------------------------------------- Tricuspid valve:  Structurally normal valve.  Leaflet separation was normal. Doppler: Transvalvular velocity was within the normal range. There was no regurgitation.  ------------------------------------------------------------------- Pulmonary artery:  The main pulmonary artery was  normal-sized. Systolic pressure could not be accurately estimated.  ------------------------------------------------------------------- Right atrium: The atrium was normal in size.  ------------------------------------------------------------------- Pericardium: There was no pericardial effusion.  ------------------------------------------------------------------- Systemic veins: Inferior vena cava: The vessel was normal in size. The respirophasic diameter changes were in the normal range (>= 50%),  consistent with normal central venous pressure.  ------------------------------------------------------------------- Measurements  Left ventricle              Value      Reference LV ID, ED, PLAX chordal     (L)   39.3  mm    43 - 52 LV ID, ES, PLAX chordal         28   mm    23 - 38 LV fx shortening, PLAX chordal      29   %    >=29 LV PW thickness, ED           15.8  mm    --------- IVS/LV PW ratio, ED           0.84      <=1.3 Stroke volume, 2D            80   ml    --------- Stroke volume/bsa, 2D          39   ml/m^2  --------- LV e&', lateral              4.93  cm/s   --------- LV E/e&', lateral             22.52      --------- LV e&', medial              4.72  cm/s   --------- LV E/e&', medial             23.52      --------- LV e&', average              4.83  cm/s   --------- LV E/e&', average             23.01      ---------  Ventricular septum            Value      Reference IVS thickness, ED            13.3  mm    ---------  LVOT                   Value      Reference LVOT ID, S                21.4  mm    --------- LVOT area                3.6  cm^2    --------- LVOT ID                 20   mm    --------- LVOT peak velocity, S          101.56 cm/s   --------- LVOT mean velocity, S          77.7  cm/s   --------- LVOT VTI, S               28.42 cm    --------- Stroke volume (SV), LVOT DP       102.2 ml    --------- Stroke index (SV/bsa), LVOT DP      50.4  ml/m^2  ---------  Aortic valve               Value      Reference Aortic valve peak velocity, S      382  cm/s   --------- Aortic valve mean velocity, S      285  cm/s   ---------  Aortic valve VTI, S           103  cm    --------- Aortic mean gradient, S         35   mm Hg  --------- Aortic peak gradient, S         58   mm Hg  --------- VTI ratio, LVOT/AV            0.29      --------- Aortic valve area, VTI          1.04  cm^2   --------- Aortic valve area/bsa, VTI        0.51  cm^2/m^2 --------- Velocity ratio, peak, LVOT/AV      0.27      --------- Aortic valve area, peak velocity     0.96  cm^2   --------- Aortic valve area/bsa, peak       0.47  cm^2/m^2 --------- velocity Velocity ratio, mean, LVOT/AV      0.27      --------- Aortic valve area, mean velocity     0.98  cm^2   --------- Aortic valve area/bsa, mean       0.48  cm^2/m^2 --------- velocity  Aorta                  Value      Reference Aortic root ID, ED            32   mm    --------- Ascending aorta ID, A-P, S        33   mm    ---------  Left atrium               Value      Reference LA ID, A-P, ES              42   mm    --------- LA ID/bsa, A-P              2.07  cm/m^2  <=2.2 LA volume, S               63    ml    --------- LA volume/bsa, S             31.1  ml/m^2  --------- LA volume, ES, 1-p A4C          60   ml    --------- LA volume/bsa, ES, 1-p A4C        29.6  ml/m^2  --------- LA volume, ES, 1-p A2C          65   ml    --------- LA volume/bsa, ES, 1-p A2C        32.1  ml/m^2  ---------  Mitral valve               Value      Reference Mitral E-wave peak velocity       111  cm/s   --------- Mitral A-wave peak velocity       128  cm/s   --------- Mitral deceleration time     (H)   261  ms    150 - 230 Mitral peak gradient, D         5   mm Hg  --------- Mitral E/A ratio, peak          0.9       ---------  Right ventricle             Value  Reference RV s&', lateral, S            12.1  cm/s   ---------  Legend: (L) and (H) mark values outside specified reference range.  ------------------------------------------------------------------- Prepared and Electronically Authenticated by  Sanda Klein, MD 2016-06-27T18:18:34    Stress Echocardiography  Patient:  Stacey Pierce, Stacey Pierce MR #:    [id number] Study Date: 05/31/2015 Gender:   F Age:    43 Height:   167.6 cm Weight:   85.9 kg BSA:    2.03 m^2 Pt. Status: Room:  SONOGRAPHER Victorio Palm, RDCS ATTENDING  Darylene Price, M.D. ORDERING   Darylene Price, M.D. PERFORMING  [facility]  cc:  -------------------------------------------------------------------  ------------------------------------------------------------------- Indications:   AVD (I35.9).  ------------------------------------------------------------------- History:  PMH: Acquired from the patient and from the patient&'s chart. Fatigue, exertional dyspnea, and dizziness. Borderline significant aortic stenosis. Risk  factors: Hypertension. Diabetes mellitus. Obese.  ------------------------------------------------------------------- Study Conclusions  - Aortic valve: Valve area (VTI): 0.85 cm^2. Valve area (Vmax): 0.85 cm^2. Valve area (Vmean): 0.89 cm^2. - Stress ECG conclusions: The stress ECG was negative for ischemia.  Impressions:  - At baseline the mean pressure gradient across the stenotic aortic valve was 35 mm Hg and the stroke volume was 72 ml.  At dobutamine of 5 mcg/kg the mean aortic valve gradient was 33 mm Hg and the stroke volume was 73 ml  At dobutamine of 10 mcg/kg the mean aortic valve gradient was 42 mmHg and the stroke volume was 78 ml  At dobutamine of 20 mcg/kg the mean aortic valve gradient was 48 mmHg and the stroke volume was 91 ml  With recovery the mean aortic gradient was 34 mmHg and the stroke volume was 73 ml  Impression: Minimal change in gradient with dobutamine stress. Moderate to severe Aortic Stenosis.  ------------------------------------------------------------------- Labs, prior tests, procedures, and surgery: Echocardiography (05/31/2015).  Dobutamine. Stress echocardiography. 2D. Birthdate: Patient birthdate: 1941/08/02. Age: Patient is 74 yr old. Sex: Gender: female.  BMI: 30.6 kg/m^2. Blood pressure:   130/54 Patient status: Outpatient. Study date: Study date: 05/31/2015. Study time: 02:47 PM.  -------------------------------------------------------------------  ------------------------------------------------------------------- Aortic valve:  Doppler:   VTI ratio of LVOT to aortic valve: 0.27. Valve area (VTI): 0.85 cm^2. Indexed valve area (VTI): 0.42 cm^2/m^2. Peak velocity ratio of LVOT to aortic valve: 0.27. Valve area (Vmax): 0.85 cm^2. Indexed valve area (Vmax): 0.42 cm^2/m^2. Mean velocity ratio of LVOT to aortic valve: 0.28. Valve area (Vmean): 0.89 cm^2. Indexed valve area (Vmean):  0.44 cm^2/m^2. Mean gradient (S): 35 mm Hg. Peak gradient (S): 62 mm Hg.  ------------------------------------------------------------------- Stress protocol:  +-----------------------+--+-------------+---+--------+ Stage         HRBP (mmHg)  SatSymptoms +-----------------------+--+-------------+---+--------+ Baseline        67130/54 (79) 94%None   +-----------------------+--+-------------+---+--------+ Dobutamine 5 ug/kg/min 68-------------92%None   +-----------------------+--+-------------+---+--------+ Dobutamine 10 ug/kg/min66159/69 (99) 93%None   +-----------------------+--+-------------+---+--------+ Dobutamine 15 ug/kg/min---------------92%None   +-----------------------+--+-------------+---+--------+ Dobutamine 20 ug/kg/min71177/71 (106) 94%None   +-----------------------+--+-------------+---+--------+ Recovery; 1 min    81-------------95%None   +-----------------------+--+-------------+---+--------+ Recovery; 2 min    82-------------94%None   +-----------------------+--+-------------+---+--------+ Recovery; 3 min    79-------------94%None   +-----------------------+--+-------------+---+--------+ Recovery; 4 min    75-------------94%None   +-----------------------+--+-------------+---+--------+ Recovery; 5 min    76-------------94%None   +-----------------------+--+-------------+---+--------+ Recovery; 6 min    [id number] (systolic)92%None   +-----------------------+--+-------------+---+--------+ Recovery; 7 min    71-------------92%None   +-----------------------+--+-------------+---+--------+  ------------------------------------------------------------------- Stress results:  Maximal heart rate during stress was 82 bpm (57% of maximal predicted heart rate). The maximal predicted heart rate was 145  bpm.The target heart rate  was achieved. The heart rate response to stress was normal. There was a normal resting blood pressure. Normal blood pressure response to dobutamine. The rate-pressure product for the peak heart rate and blood pressure was 12567 mm Hg/min. The patient experienced no chest pain during stress.  Functional capacity was normal.  ------------------------------------------------------------------- Stress ECG:  Normal sinus rhythm. The stress ECG was negative for ischemia.  Maximal heart rate achieved was 82 which represents 57% of PMHR.  ------------------------------------------------------------------- Baseline:  - LV size was normal. - LV global systolic function was normal.  Stroke volume: 72 ml. Low dose: LV global systolic function was normal. Stroke volume: 73 ml. Peak stress: LV global systolic function was normal. Stroke volume: 91 ml. Recovery: LV global systolic function was normal. Stroke volume: 73 ml.  ------------------------------------------------------------------- Measurements  Left ventricle              Value Stroke volume, 2D            73  ml Stroke volume/bsa, 2D          36  ml/m^2  LVOT                   Value LVOT ID, S                20  mm LVOT area                3.14 cm^2 LVOT peak velocity, S          107  cm/s LVOT mean velocity, S          79.5 cm/s LVOT VTI, S               23.1 cm LVOT peak gradient, S          5   mm Hg  Aortic valve               Value Aortic valve peak velocity, S      393  cm/s Aortic valve mean velocity, S      281  cm/s Aortic valve VTI, S           85.7 cm Aortic mean gradient, S         35  mm Hg Aortic peak gradient, S         62  mm Hg VTI ratio, LVOT/AV            0.27 Aortic  valve area, VTI          0.85 cm^2 Aortic valve area/bsa, VTI        0.42 cm^2/m^2 Velocity ratio, peak, LVOT/AV      0.27 Aortic valve area, peak velocity     0.85 cm^2 Aortic valve area/bsa, peak velocity   0.42 cm^2/m^2 Velocity ratio, mean, LVOT/AV      0.28 Aortic valve area, mean velocity     0.89 cm^2 Aortic valve area/bsa, mean velocity   0.44 cm^2/m^2  Legend: (L) and (H) mark values outside specified reference range.  ------------------------------------------------------------------- Prepared and Electronically Authenticated by  Darlin Coco 2016-06-27T20:13:01   Cardiac TAVR CT  TECHNIQUE: The patient was scanned on a Philips 256 scanner. A 120 kV retrospective scan was triggered in the descending thoracic aorta at 111 HU's. Gantry rotation speed was 270 msecs and collimation was .9 mm. No beta blockade or nitro were given. The 3D data set was reconstructed in 5% intervals of the R-R cycle.  Systolic and diastolic phases were analyzed on a dedicated work station using MPR, MIP and VRT modes. The patient received 80 cc of contrast.  FINDINGS: Aortic Valve: Bicuspid aortic valve is moderately thickened and calcified with restricted leaflet opening. Minimal subvalvular calcifications.  Aorta: Normal caliber. No dissection. There are minimal calcifications in the ascending aorta and moderate diffuse calcifications in the aortic arch and predominantly in the descending aorta.  Sinotubular Junction: 30 x 30 mm  Ascending Thoracic Aorta: 36 x 36 mm  Aortic Arch: 26 x 25 mm  Descending Thoracic Aorta: 20 x 19 mm  Sinus of Valsalva Measurements: 33 x 28 mm  Virtual Basal Annulus Measurements:  Maximum/Minimum Diameter: 24 x 21 mm  Perimeter: 85 mm  Area: 423 mm2  Coronary Artery Height above Annulus:  Left Main: 14 mm  Right Coronary: 11 mm  Optimum Fluoroscopic  Angle for Delivery: LAO 13 CRA 0  Other findings: Normal pulmonary veins drainage into the left atrium (3 on the right, 2 on the left).  Mildly dilated left atrium, large left atrial appendage with no filling defect.  Moderate concentric left ventricular hypertrophy.  Mitral annular calcifications.  No ASD or VSD seen.  IMPRESSION: 1. Bicuspid aortic valve with normal size of the aortic root and ascending aorta. The annular measurements suitable for deployment of 23 mm Edward-SAPIEN 3 TAVR valve.  2. Sufficient annulus to coronary distance.  3. Optimum Fluoroscopic Angle for Delivery: LAO 53 CRA 0  Ena Dawley   Electronically Signed  By: Ena Dawley  On: 06/03/2015 18:46   STS Risk Calculator  Procedure    AVR  Risk of Mortality   3.0% Morbidity or Mortality  16.2% Prolonged LOS   7.2% Short LOS    31.3% Permanent Stroke   1.5% Prolonged Vent Support  10.0% DSW Infection    0.5% Renal Failure    4.9% Reoperation    7.0%    Impression:  The patient has what appears to be a bicuspid aortic valve with stage C1 severe aortic stenosis.  The patient reports that she remains asymptomatic, although she admits that she does not exercise much on a regular basis.  She reportedly only exercised for 3-4 minutes during a standard Bruce protocol exercise treadmill test recently performed at Dr. Alveria Apley office, although there apparently was no associated drop in blood pressure during exercise.  I have personally reviewed the patient's recent transthoracic echocardiogram, dobutamine stress echocardiogram, and cardiac gated CT angiogram of the heart.  By all criteria the patient has aortic stenosis that is bordering on severe aortic stenosis with peak velocity across aortic valve measuring 3.7 to 3.8 m/s and calculated aortic valve area estimated less than 1 m/s.  The patient had normal blood pressure response with stress during dobutamine  echocardiography, and both stroke volume and mean transvalvular gradient increased appropriately during dobutamine infusion, reaching 48 mmHg at peak stress.  These findings are affected by the presence of relatively small size aortic annulus, although the dimensionless velocity ratio between LVOT and aortic root measures 0.27.  I think it would be reasonable to proceed with elective aortic valve replacement in the near future.  Risks associated with conventional surgical aortic valve replacement would only be mild to moderately elevated, although aortic root enlargement might be required to avoid the development of patient prosthesis mismatch.  Alternatively, continued medical therapy with very close follow-up would be reasonable as well.   Plan:  The patient and her family were counseled at length regarding treatment  alternatives for management of severe aortic stenosis including continued medical therapy versus proceeding with aortic valve replacement in the near future.  The natural history of aortic stenosis was reviewed, as was long term prognosis with medical therapy alone.  Surgical options were discussed at length including conventional surgical aortic valve replacement using either a mechanical prosthesis or a bioprosthetic tissue valve through either a conventional median sternotomy or using minimally invasive techniques.  Other alternatives including transcatheter aortic valve replacement were discussed.  At present the patient desires to hold off on proceeding with surgery in the immediate future. She understands that close medical follow-up will be necessary. She has been instructed to contact Dr. Alveria Apley office to make certain that follow-up appointment will be scheduled within the next 6 months. We will plan to see her back after she has been seen by Dr. Nehemiah Massed.  All of her questions have been addressed.   I spent in excess of 30 minutes during the conduct of this office consultation and  >50% of this time involved direct face-to-face encounter with the patient for counseling and/or coordination of their care.    Valentina Gu. Roxy Manns, MD 06/14/2015 10:20 AM

## 2015-07-13 ENCOUNTER — Other Ambulatory Visit: Payer: Self-pay | Admitting: Emergency Medicine

## 2015-07-13 ENCOUNTER — Telehealth: Payer: Self-pay | Admitting: Family Medicine

## 2015-07-13 MED ORDER — LEVOTHYROXINE SODIUM 100 MCG PO TABS
100.0000 ug | ORAL_TABLET | Freq: Every day | ORAL | Status: DC
Start: 2015-07-13 — End: 2016-07-28

## 2015-07-13 NOTE — Telephone Encounter (Signed)
Med sent in, LM for pt.

## 2015-07-13 NOTE — Telephone Encounter (Signed)
Pt contacted office for refill request on the following medications: levothyroxine (SYNTHROID, LEVOTHROID) 100 MCG tablet be sent to [facility]. It looks like last refill was 06/04/15 and last OV was 02/25/15. Pt is requesting a 90 day supply to be sent in. Thanks TNP

## 2015-07-26 DIAGNOSIS — Z86010 Personal history of colonic polyps: Secondary | ICD-10-CM | POA: Diagnosis not present

## 2015-08-20 DIAGNOSIS — E1122 Type 2 diabetes mellitus with diabetic chronic kidney disease: Secondary | ICD-10-CM | POA: Diagnosis not present

## 2015-08-20 DIAGNOSIS — N183 Chronic kidney disease, stage 3 (moderate): Secondary | ICD-10-CM | POA: Diagnosis not present

## 2015-08-26 ENCOUNTER — Encounter: Payer: Self-pay | Admitting: Family Medicine

## 2015-08-26 ENCOUNTER — Ambulatory Visit (INDEPENDENT_AMBULATORY_CARE_PROVIDER_SITE_OTHER): Payer: Medicare Other | Admitting: Family Medicine

## 2015-08-26 VITALS — BP 120/58 | HR 62 | Temp 97.6°F | Resp 16 | Wt 193.0 lb

## 2015-08-26 DIAGNOSIS — J309 Allergic rhinitis, unspecified: Secondary | ICD-10-CM | POA: Insufficient documentation

## 2015-08-26 DIAGNOSIS — R011 Cardiac murmur, unspecified: Secondary | ICD-10-CM | POA: Insufficient documentation

## 2015-08-26 DIAGNOSIS — Z23 Encounter for immunization: Secondary | ICD-10-CM | POA: Diagnosis not present

## 2015-08-26 DIAGNOSIS — I10 Essential (primary) hypertension: Secondary | ICD-10-CM

## 2015-08-26 DIAGNOSIS — I2510 Atherosclerotic heart disease of native coronary artery without angina pectoris: Secondary | ICD-10-CM

## 2015-08-26 DIAGNOSIS — E118 Type 2 diabetes mellitus with unspecified complications: Secondary | ICD-10-CM

## 2015-08-26 DIAGNOSIS — G8929 Other chronic pain: Secondary | ICD-10-CM | POA: Insufficient documentation

## 2015-08-26 DIAGNOSIS — J45909 Unspecified asthma, uncomplicated: Secondary | ICD-10-CM | POA: Insufficient documentation

## 2015-08-26 DIAGNOSIS — H409 Unspecified glaucoma: Secondary | ICD-10-CM | POA: Insufficient documentation

## 2015-08-26 DIAGNOSIS — M5137 Other intervertebral disc degeneration, lumbosacral region: Secondary | ICD-10-CM | POA: Insufficient documentation

## 2015-08-26 DIAGNOSIS — M51379 Other intervertebral disc degeneration, lumbosacral region without mention of lumbar back pain or lower extremity pain: Secondary | ICD-10-CM

## 2015-08-26 DIAGNOSIS — E119 Type 2 diabetes mellitus without complications: Secondary | ICD-10-CM | POA: Insufficient documentation

## 2015-08-26 DIAGNOSIS — E785 Hyperlipidemia, unspecified: Secondary | ICD-10-CM

## 2015-08-26 DIAGNOSIS — K219 Gastro-esophageal reflux disease without esophagitis: Secondary | ICD-10-CM | POA: Insufficient documentation

## 2015-08-26 DIAGNOSIS — E669 Obesity, unspecified: Secondary | ICD-10-CM | POA: Insufficient documentation

## 2015-08-26 DIAGNOSIS — M549 Dorsalgia, unspecified: Secondary | ICD-10-CM

## 2015-08-26 HISTORY — DX: Other intervertebral disc degeneration, lumbosacral region without mention of lumbar back pain or lower extremity pain: M51.379

## 2015-08-26 HISTORY — DX: Allergic rhinitis, unspecified: J30.9

## 2015-08-26 NOTE — Progress Notes (Signed)
Patient ID: Stacey Pierce, female   DOB: 1941-04-29, 74 y.o.   MRN: [id number]    Subjective:  HPI  Hypertension, follow-up:  BP Readings from Last 3 Encounters:  08/26/15 120/58  02/25/15 138/80  06/14/15 152/70    She was last seen for hypertension 6 months ago.  BP at that visit was 138/60. Management since that visit includes Pt reports that Dr. Gabriel Carina put her HCTZ because her BP was elevated. She reports good compliance with treatment. She is not having side effects.  She is exercising. Bowling, once a week.  She is adherent to low salt diet.   Outside blood pressures are not being checked. She is experiencing none.   Wt Readings from Last 3 Encounters:  08/26/15 193 lb (87.544 kg)  02/25/15 194 lb (87.998 kg)  06/14/15 189 lb (85.73 kg)   ------------------------------------------------------------------------   Pt reports that she is having some shortness of breath when she bends over but says that it is due to a heart valve that needs to be be replaced. She is seeing Dr. Nehemiah Massed and a surgeon in [place], Dr. Ricard Dillon.  Prior to Admission medications   Medication Sig Start Date End Date Taking? Authorizing Provider  amitriptyline (ELAVIL) 10 MG tablet Take 10 mg by mouth at bedtime.   Yes Historical Provider, MD  amLODipine (NORVASC) 5 MG tablet 1 TABLET TABLET, ORAL, DAILY 05/29/15  Yes Jerrol Banana., MD  aspirin 81 MG tablet Take 81 mg by mouth daily.   Yes Historical Provider, MD  aspirin 81 MG tablet Take by mouth. 09/30/11  Yes Historical Provider, MD  atorvastatin (LIPITOR) 20 MG tablet Take 20 mg by mouth daily. Received from external pharmacy.   Yes Historical Provider, MD  carvedilol (COREG) 25 MG tablet  03/22/15  Yes Historical Provider, MD  diphenhydrAMINE (BENADRYL) 25 MG tablet Take 25 mg by mouth every 6 (six) hours as needed.   Yes Historical Provider, MD  dorzolamide-timolol (COSOPT) 22.3-6.8 MG/ML ophthalmic solution  05/10/15  Yes Historical  Provider, MD  hydrochlorothiazide (HYDRODIURIL) 25 MG tablet Take 25 mg by mouth daily.   Yes Historical Provider, MD  insulin detemir (LEVEMIR) 100 UNIT/ML injection Inject into the skin 2 (two) times daily. 30 units in the am and 80 units at hs   Yes Historical Provider, MD  latanoprost (XALATAN) 0.005 % ophthalmic solution 1 drop at bedtime.   Yes Historical Provider, MD  levothyroxine (SYNTHROID, LEVOTHROID) 100 MCG tablet Take 1 tablet (100 mcg total) by mouth daily. 07/13/15  Yes Ireland Chagnon Maceo Pro., MD  losartan (COZAAR) 100 MG tablet Take 100 mg by mouth daily.   Yes Historical Provider, MD  metFORMIN (GLUCOPHAGE) 1000 MG tablet Take 2,500 mg by mouth at bedtime.    Yes Historical Provider, MD  MULTIPLE VITAMIN PO Take by mouth.   Yes Historical Provider, MD  NOVOLOG 100 UNIT/ML injection 30 Units. Am and pm 02/12/15  Yes Historical Provider, MD  omeprazole (PRILOSEC OTC) 20 MG tablet Take by mouth. 09/30/11  Yes Historical Provider, MD  timolol (TIMOPTIC) 0.25 % ophthalmic solution Apply to eye. 09/30/11  Yes Historical Provider, MD  traMADol Veatrice Bourbon) 50 MG tablet Take by mouth. 09/18/13  Yes Historical Provider, MD    Patient Active Problem List   Diagnosis Date Noted  . Allergic rhinitis 08/26/2015  . Airway hyperreactivity 08/26/2015  . Back pain, chronic 08/26/2015  . Diabetes 08/26/2015  . Degeneration of lumbar or lumbosacral intervertebral disc 08/26/2015  . Acid reflux  08/26/2015  . Glaucoma 08/26/2015  . Cardiac murmur 08/26/2015  . HLD (hyperlipidemia) 08/26/2015  . Adiposity 08/26/2015  . Severe aortic stenosis 05/10/2015  . Benign essential HTN 04/29/2015  . Arteriosclerosis of coronary artery 01/26/2015  . Retinopathy 11/19/2014  . Chronic kidney disease (CKD), stage III (moderate) 11/19/2014    Past Medical History  Diagnosis Date  . Hyperlipidemia   . Hypertension   . Hypothyroidism   . Aortic stenosis   . DR (diabetic retinopathy)   . Glaucoma   .  Obesity   . Long-term insulin use   . Microalbuminuria   . Diabetes mellitus     Takes metformin  . Renal insufficiency     diabetic related, "starting to fail"-per patient.(6.30.16)  . Airway hyperreactivity 08/26/2015    Social History   Social History  . Marital Status: Married    Spouse Name: N/A  . Number of Children: N/A  . Years of Education: N/A   Occupational History  . Not on file.   Social History Main Topics  . Smoking status: Never Smoker   . Smokeless tobacco: Never Used  . Alcohol Use: No  . Drug Use: No  . Sexual Activity: Not on file   Other Topics Concern  . Not on file   Social History Narrative    Allergies  Allergen Reactions  . Azithromycin   . Codeine   . Penicillins   . Sulfa Antibiotics   . Erythromycin Rash    Review of Systems  Constitutional: Negative.   HENT: Negative.   Eyes: Negative.   Respiratory: Positive for shortness of breath (Pt has a heart valve that needs replacement. Dr. Nehemiah Massed and Dr. Roxy Manns in [place].).   Cardiovascular: Negative.   Gastrointestinal: Negative.   Genitourinary: Negative.   Musculoskeletal: Negative.   Skin: Negative.   Neurological: Negative.   Endo/Heme/Allergies: Negative.   Psychiatric/Behavioral: Negative.     Immunization History  Administered Date(s) Administered  . Pneumococcal Conjugate-13 02/25/2015  . Pneumococcal Polysaccharide-23 10/06/2013   Objective:  BP 120/58 mmHg  Pulse 62  Temp(Src) 97.6 F (36.4 C) (Oral)  Resp 16  Wt 193 lb (87.544 kg)  Physical Exam  Constitutional: She is oriented to person, place, and time and well-developed, well-nourished, and in no distress.  HENT:  Head: Normocephalic and atraumatic.  Right Ear: External ear normal.  Left Ear: External ear normal.  Nose: Nose normal.  Eyes: Conjunctivae are normal.  Neck: Neck supple.  Cardiovascular: Normal rate, regular rhythm and normal heart sounds.   3/6 systolic murmur at the right upper  sternal border  Pulmonary/Chest: Effort normal and breath sounds normal.  Abdominal: Soft.  Neurological: She is alert and oriented to person, place, and time.  Skin: Skin is warm and dry.  Psychiatric: Mood, memory, affect and judgment normal.    Lab Results  Component Value Date   GLUCOSE 144* 06/08/2015   CHOL 147 09/24/2014   TRIG 203* 09/24/2014   HDL 40 09/24/2014   LDLCALC 66 09/24/2014   TSH 4.09 09/24/2014    CMP     Component Value Date/Time   NA 135 06/08/2015 1442   NA 140 09/24/2014   K 4.2 06/08/2015 1442   CL 102 06/08/2015 1442   CO2 23 06/08/2015 1442   GLUCOSE 144* 06/08/2015 1442   BUN 20 06/08/2015 1442   BUN 18 09/24/2014   CREATININE 1.13* 06/08/2015 1442   CREATININE 1.2* 09/24/2014   CALCIUM 9.3 06/08/2015 1442   PROT 6.6 06/08/2015  1442   ALBUMIN 4.0 06/08/2015 1442   AST 16 06/08/2015 1442   ALT 17 06/08/2015 1442   ALKPHOS 67 06/08/2015 1442   BILITOT 0.3 06/08/2015 1442   GFRNONAA 48* 06/08/2015 1442   GFRAA 55* 06/08/2015 1442    Assessment and Plan :  Benign essential HTN - Plan: CBC with Differential/Platelet, TSH  Type 2 diabetes mellitus with complication - Plan: Comprehensive metabolic panel  HLD (hyperlipidemia) - Plan: Lipid Panel With LDL/HDL Ratio, Comprehensive metabolic panel  Arteriosclerosis of coronary artery  Need for influenza vaccination - Plan: Flu vaccine HIGH DOSE PF  Aortic Valve Disease/Stenosis   [facility] 08/26/2015 11:16 AM

## 2015-08-27 DIAGNOSIS — I10 Essential (primary) hypertension: Secondary | ICD-10-CM | POA: Diagnosis not present

## 2015-08-27 DIAGNOSIS — E1122 Type 2 diabetes mellitus with diabetic chronic kidney disease: Secondary | ICD-10-CM | POA: Diagnosis not present

## 2015-08-27 DIAGNOSIS — R809 Proteinuria, unspecified: Secondary | ICD-10-CM | POA: Diagnosis not present

## 2015-08-27 DIAGNOSIS — N183 Chronic kidney disease, stage 3 (moderate): Secondary | ICD-10-CM | POA: Diagnosis not present

## 2015-08-27 DIAGNOSIS — Z794 Long term (current) use of insulin: Secondary | ICD-10-CM | POA: Diagnosis not present

## 2015-09-02 DIAGNOSIS — I10 Essential (primary) hypertension: Secondary | ICD-10-CM | POA: Diagnosis not present

## 2015-09-02 DIAGNOSIS — E118 Type 2 diabetes mellitus with unspecified complications: Secondary | ICD-10-CM | POA: Diagnosis not present

## 2015-09-02 DIAGNOSIS — E785 Hyperlipidemia, unspecified: Secondary | ICD-10-CM | POA: Diagnosis not present

## 2015-09-03 LAB — CBC WITH DIFFERENTIAL/PLATELET
BASOS: 0 %
Basophils Absolute: 0 10*3/uL (ref 0.0–0.2)
EOS (ABSOLUTE): 0.3 10*3/uL (ref 0.0–0.4)
Eos: 5 %
Hematocrit: 32.2 % — ABNORMAL LOW (ref 34.0–46.6)
Hemoglobin: 10.6 g/dL — ABNORMAL LOW (ref 11.1–15.9)
Immature Grans (Abs): 0 10*3/uL (ref 0.0–0.1)
Immature Granulocytes: 0 %
Lymphocytes Absolute: 2 10*3/uL (ref 0.7–3.1)
Lymphs: 30 %
MCH: 28 pg (ref 26.6–33.0)
MCHC: 32.9 g/dL (ref 31.5–35.7)
MCV: 85 fL (ref 79–97)
MONOS ABS: 0.6 10*3/uL (ref 0.1–0.9)
Monocytes: 9 %
NEUTROS ABS: 3.7 10*3/uL (ref 1.4–7.0)
Neutrophils: 56 %
PLATELETS: 254 10*3/uL (ref 150–379)
RBC: 3.79 x10E6/uL (ref 3.77–5.28)
RDW: 14.3 % (ref 12.3–15.4)
WBC: 6.7 10*3/uL (ref 3.4–10.8)

## 2015-09-03 LAB — LIPID PANEL WITH LDL/HDL RATIO
Cholesterol, Total: 145 mg/dL (ref 100–199)
HDL: 41 mg/dL (ref >39)
LDL Calculated: 61 mg/dL (ref 0–99)
LDl/HDL Ratio: 1.5 ratio units (ref 0.0–3.2)
Triglycerides: 217 mg/dL — ABNORMAL HIGH (ref 0–149)
VLDL Cholesterol Cal: 43 mg/dL — ABNORMAL HIGH (ref 5–40)

## 2015-09-03 LAB — COMPREHENSIVE METABOLIC PANEL
ALT: 19 IU/L (ref 0–32)
AST: 16 IU/L (ref 0–40)
Albumin/Globulin Ratio: 1.9 (ref 1.1–2.5)
Albumin: 4.1 g/dL (ref 3.5–4.8)
Alkaline Phosphatase: 79 IU/L (ref 39–117)
BUN/Creatinine Ratio: 15 (ref 11–26)
BUN: 16 mg/dL (ref 8–27)
CALCIUM: 9.4 mg/dL (ref 8.7–10.3)
CHLORIDE: 100 mmol/L (ref 97–108)
CO2: 21 mmol/L (ref 18–29)
Creatinine, Ser: 1.09 mg/dL — ABNORMAL HIGH (ref 0.57–1.00)
GFR, EST AFRICAN AMERICAN: 57 mL/min/{1.73_m2} — AB (ref >59)
GFR, EST NON AFRICAN AMERICAN: 50 mL/min/{1.73_m2} — AB (ref >59)
GLUCOSE: 206 mg/dL — AB (ref 65–99)
Globulin, Total: 2.2 g/dL (ref 1.5–4.5)
Potassium: 4.8 mmol/L (ref 3.5–5.2)
Sodium: 138 mmol/L (ref 134–144)
TOTAL PROTEIN: 6.3 g/dL (ref 6.0–8.5)

## 2015-09-03 LAB — TSH: TSH: 1.63 u[IU]/mL (ref 0.450–4.500)

## 2015-09-07 ENCOUNTER — Telehealth: Payer: Self-pay

## 2015-09-07 NOTE — Telephone Encounter (Signed)
-----   Message from Jerrol Banana., MD sent at 09/07/2015  8:40 AM EDT ----- Mildly anemic. Would repeat CBC  in 3-4 weeks. Please print out any CBC from 2015 or 2014 labs. Thank you

## 2015-09-07 NOTE — Telephone Encounter (Signed)
Advised patient as below. CBC from 2014 was printed and placed on your desk. Thanks!

## 2015-09-13 ENCOUNTER — Other Ambulatory Visit: Payer: Self-pay | Admitting: Family Medicine

## 2015-09-16 DIAGNOSIS — N183 Chronic kidney disease, stage 3 (moderate): Secondary | ICD-10-CM | POA: Diagnosis not present

## 2015-09-16 DIAGNOSIS — D631 Anemia in chronic kidney disease: Secondary | ICD-10-CM | POA: Diagnosis not present

## 2015-09-16 DIAGNOSIS — I10 Essential (primary) hypertension: Secondary | ICD-10-CM | POA: Diagnosis not present

## 2015-09-16 DIAGNOSIS — E1122 Type 2 diabetes mellitus with diabetic chronic kidney disease: Secondary | ICD-10-CM | POA: Diagnosis not present

## 2015-09-24 ENCOUNTER — Other Ambulatory Visit: Payer: Self-pay

## 2015-09-24 ENCOUNTER — Other Ambulatory Visit: Payer: Self-pay | Admitting: Family Medicine

## 2015-09-24 DIAGNOSIS — D649 Anemia, unspecified: Secondary | ICD-10-CM | POA: Diagnosis not present

## 2015-09-25 LAB — CBC WITH DIFFERENTIAL/PLATELET
BASOS ABS: 0 10*3/uL (ref 0.0–0.2)
BASOS: 1 %
EOS (ABSOLUTE): 0.3 10*3/uL (ref 0.0–0.4)
Eos: 4 %
Hematocrit: 32.4 % — ABNORMAL LOW (ref 34.0–46.6)
Hemoglobin: 10.9 g/dL — ABNORMAL LOW (ref 11.1–15.9)
IMMATURE GRANS (ABS): 0 10*3/uL (ref 0.0–0.1)
Immature Granulocytes: 0 %
LYMPHS ABS: 2.4 10*3/uL (ref 0.7–3.1)
LYMPHS: 32 %
MCH: 28.5 pg (ref 26.6–33.0)
MCHC: 33.6 g/dL (ref 31.5–35.7)
MCV: 85 fL (ref 79–97)
MONOCYTES: 10 %
Monocytes Absolute: 0.8 10*3/uL (ref 0.1–0.9)
NEUTROS ABS: 4.1 10*3/uL (ref 1.4–7.0)
Neutrophils: 53 %
Platelets: 281 10*3/uL (ref 150–379)
RBC: 3.83 x10E6/uL (ref 3.77–5.28)
RDW: 14.1 % (ref 12.3–15.4)
WBC: 7.5 10*3/uL (ref 3.4–10.8)

## 2015-09-27 DIAGNOSIS — H2511 Age-related nuclear cataract, right eye: Secondary | ICD-10-CM | POA: Diagnosis not present

## 2015-10-07 DIAGNOSIS — H2511 Age-related nuclear cataract, right eye: Secondary | ICD-10-CM | POA: Diagnosis not present

## 2015-10-11 ENCOUNTER — Encounter: Payer: Self-pay | Admitting: *Deleted

## 2015-10-12 NOTE — Discharge Instructions (Signed)

## 2015-10-13 ENCOUNTER — Ambulatory Visit: Payer: Medicare Other | Admitting: Anesthesiology

## 2015-10-13 ENCOUNTER — Encounter
Admission: RE | Disposition: A | Discharge status: Home / Self Care | Payer: Self-pay | Source: Ambulatory Visit | Attending: Ophthalmology

## 2015-10-13 ENCOUNTER — Ambulatory Visit
Admission: RE | Admit: 2015-10-13 | Discharge: 2015-10-13 | Disposition: A | Discharge status: Home / Self Care | Payer: Medicare Other | Source: Ambulatory Visit | Attending: Ophthalmology | Admitting: Ophthalmology

## 2015-10-13 DIAGNOSIS — Z794 Long term (current) use of insulin: Secondary | ICD-10-CM | POA: Insufficient documentation

## 2015-10-13 DIAGNOSIS — E785 Hyperlipidemia, unspecified: Secondary | ICD-10-CM | POA: Insufficient documentation

## 2015-10-13 DIAGNOSIS — E039 Hypothyroidism, unspecified: Secondary | ICD-10-CM | POA: Diagnosis not present

## 2015-10-13 DIAGNOSIS — M5136 Other intervertebral disc degeneration, lumbar region: Secondary | ICD-10-CM | POA: Insufficient documentation

## 2015-10-13 DIAGNOSIS — H5703 Miosis: Secondary | ICD-10-CM | POA: Diagnosis not present

## 2015-10-13 DIAGNOSIS — I1310 Hypertensive heart and chronic kidney disease without heart failure, with stage 1 through stage 4 chronic kidney disease, or unspecified chronic kidney disease: Secondary | ICD-10-CM | POA: Insufficient documentation

## 2015-10-13 DIAGNOSIS — I350 Nonrheumatic aortic (valve) stenosis: Secondary | ICD-10-CM | POA: Diagnosis not present

## 2015-10-13 DIAGNOSIS — H409 Unspecified glaucoma: Secondary | ICD-10-CM | POA: Diagnosis not present

## 2015-10-13 DIAGNOSIS — E11319 Type 2 diabetes mellitus with unspecified diabetic retinopathy without macular edema: Secondary | ICD-10-CM | POA: Insufficient documentation

## 2015-10-13 DIAGNOSIS — Z8371 Family history of colonic polyps: Secondary | ICD-10-CM | POA: Insufficient documentation

## 2015-10-13 DIAGNOSIS — N183 Chronic kidney disease, stage 3 (moderate): Secondary | ICD-10-CM | POA: Diagnosis not present

## 2015-10-13 DIAGNOSIS — H2511 Age-related nuclear cataract, right eye: Secondary | ICD-10-CM | POA: Diagnosis not present

## 2015-10-13 DIAGNOSIS — K219 Gastro-esophageal reflux disease without esophagitis: Secondary | ICD-10-CM | POA: Insufficient documentation

## 2015-10-13 DIAGNOSIS — Z6830 Body mass index (BMI) 30.0-30.9, adult: Secondary | ICD-10-CM | POA: Insufficient documentation

## 2015-10-13 DIAGNOSIS — Z803 Family history of malignant neoplasm of breast: Secondary | ICD-10-CM | POA: Insufficient documentation

## 2015-10-13 DIAGNOSIS — J309 Allergic rhinitis, unspecified: Secondary | ICD-10-CM | POA: Diagnosis not present

## 2015-10-13 DIAGNOSIS — Z9851 Tubal ligation status: Secondary | ICD-10-CM | POA: Insufficient documentation

## 2015-10-13 DIAGNOSIS — Z8249 Family history of ischemic heart disease and other diseases of the circulatory system: Secondary | ICD-10-CM | POA: Insufficient documentation

## 2015-10-13 DIAGNOSIS — T7840XA Allergy, unspecified, initial encounter: Secondary | ICD-10-CM | POA: Diagnosis not present

## 2015-10-13 DIAGNOSIS — Z9049 Acquired absence of other specified parts of digestive tract: Secondary | ICD-10-CM | POA: Diagnosis not present

## 2015-10-13 DIAGNOSIS — Z833 Family history of diabetes mellitus: Secondary | ICD-10-CM | POA: Diagnosis not present

## 2015-10-13 DIAGNOSIS — E1122 Type 2 diabetes mellitus with diabetic chronic kidney disease: Secondary | ICD-10-CM | POA: Diagnosis not present

## 2015-10-13 DIAGNOSIS — Z9842 Cataract extraction status, left eye: Secondary | ICD-10-CM | POA: Diagnosis not present

## 2015-10-13 DIAGNOSIS — I2510 Atherosclerotic heart disease of native coronary artery without angina pectoris: Secondary | ICD-10-CM | POA: Diagnosis not present

## 2015-10-13 DIAGNOSIS — Z818 Family history of other mental and behavioral disorders: Secondary | ICD-10-CM | POA: Insufficient documentation

## 2015-10-13 DIAGNOSIS — E669 Obesity, unspecified: Secondary | ICD-10-CM | POA: Insufficient documentation

## 2015-10-13 DIAGNOSIS — H269 Unspecified cataract: Secondary | ICD-10-CM | POA: Diagnosis present

## 2015-10-13 HISTORY — DX: Gastro-esophageal reflux disease without esophagitis: K21.9

## 2015-10-13 HISTORY — PX: CATARACT EXTRACTION W/PHACO: SHX586

## 2015-10-13 LAB — GLUCOSE, CAPILLARY
Glucose-Capillary: 199 mg/dL — ABNORMAL HIGH (ref 65–99)
Glucose-Capillary: 193 mg/dL — ABNORMAL HIGH (ref 65–99)

## 2015-10-13 SURGERY — PHACOEMULSIFICATION, CATARACT, WITH IOL INSERTION
Anesthesia: Monitor Anesthesia Care | Laterality: Right | Wound class: Clean

## 2015-10-13 MED ORDER — NA HYALUR & NA CHOND-NA HYALUR 0.4-0.35 ML IO KIT
PACK | INTRAOCULAR | Status: DC | PRN
  Administered 2015-10-13: 1 mL via INTRAOCULAR

## 2015-10-13 MED ORDER — TIMOLOL MALEATE 0.5 % OP SOLN
OPHTHALMIC | Status: DC | PRN
  Administered 2015-10-13: 1 [drp] via OPHTHALMIC

## 2015-10-13 MED ORDER — EPINEPHRINE HCL 1 MG/ML IJ SOLN
INTRAOCULAR | Status: DC | PRN
  Administered 2015-10-13: 86 mL via OPHTHALMIC

## 2015-10-13 MED ORDER — POVIDONE-IODINE 5 % OP SOLN
1.0000 "application " | OPHTHALMIC | Status: DC | PRN
  Administered 2015-10-13: 1 via OPHTHALMIC

## 2015-10-13 MED ORDER — CEFUROXIME OPHTHALMIC INJECTION 1 MG/0.1 ML
INJECTION | OPHTHALMIC | Status: DC | PRN
  Administered 2015-10-13: 0.1 mL via OPHTHALMIC

## 2015-10-13 MED ORDER — FENTANYL CITRATE (PF) 100 MCG/2ML IJ SOLN
INTRAMUSCULAR | Status: DC | PRN
  Administered 2015-10-13: 50 ug via INTRAVENOUS

## 2015-10-13 MED ORDER — MIDAZOLAM HCL 2 MG/2ML IJ SOLN
INTRAMUSCULAR | Status: DC | PRN
  Administered 2015-10-13: 2 mg via INTRAVENOUS

## 2015-10-13 MED ORDER — ARMC OPHTHALMIC DILATING GEL
1.0000 "application " | OPHTHALMIC | Status: DC | PRN
  Administered 2015-10-13 (×2): 1 via OPHTHALMIC

## 2015-10-13 MED ORDER — TETRACAINE HCL 0.5 % OP SOLN
1.0000 [drp] | OPHTHALMIC | Status: DC | PRN
  Administered 2015-10-13: 1 [drp] via OPHTHALMIC

## 2015-10-13 MED ORDER — BRIMONIDINE TARTRATE 0.2 % OP SOLN
OPHTHALMIC | Status: DC | PRN
  Administered 2015-10-13: 1 [drp] via OPHTHALMIC

## 2015-10-13 MED ORDER — BALANCED SALT IO SOLN
INTRAOCULAR | Status: DC | PRN
  Administered 2015-10-13: 1 mL via OPHTHALMIC

## 2015-10-13 SURGICAL SUPPLY — 26 items
CANNULA ANT/CHMB 27GA (MISCELLANEOUS) ×2 IMPLANT
GLOVE SURG LX 7.5 STRW (GLOVE) ×1
GLOVE SURG LX STRL 7.5 STRW (GLOVE) ×1 IMPLANT
GLOVE SURG TRIUMPH 8.0 PF LTX (GLOVE) ×2 IMPLANT
GOWN STRL REUS W/ TWL LRG LVL3 (GOWN DISPOSABLE) ×2 IMPLANT
GOWN STRL REUS W/TWL LRG LVL3 (GOWN DISPOSABLE) ×2
LENS IOL ACRSF IQ PC 20.5 (Intraocular Lens) ×1 IMPLANT
LENS IOL ACRYSOF IQ POST 20.5 (Intraocular Lens) ×2 IMPLANT
MARKER SKIN SURG W/RULER VIO (MISCELLANEOUS) ×2 IMPLANT
NDL RETROBULBAR .5 NSTRL (NEEDLE) IMPLANT
NEEDLE FILTER BLUNT 18X 1/2SAF (NEEDLE) ×1
NEEDLE FILTER BLUNT 18X1 1/2 (NEEDLE) ×1 IMPLANT
PACK CATARACT BRASINGTON (MISCELLANEOUS) ×2 IMPLANT
PACK EYE AFTER SURG (MISCELLANEOUS) ×2 IMPLANT
PACK OPTHALMIC (MISCELLANEOUS) ×2 IMPLANT
RING MALYGIN 7.0 (MISCELLANEOUS) ×2 IMPLANT
SUT ETHILON 10-0 CS-B-6CS-B-6 (SUTURE)
SUT VICRYL  9 0 (SUTURE)
SUT VICRYL 9 0 (SUTURE) IMPLANT
SUTURE EHLN 10-0 CS-B-6CS-B-6 (SUTURE) IMPLANT
SYR 3ML LL SCALE MARK (SYRINGE) ×2 IMPLANT
SYR 5ML LL (SYRINGE) IMPLANT
SYR TB 1ML LUER SLIP (SYRINGE) ×2 IMPLANT
WATER STERILE IRR 250ML POUR (IV SOLUTION) ×2 IMPLANT
WATER STERILE IRR 500ML POUR (IV SOLUTION) IMPLANT
WIPE NON LINTING 3.25X3.25 (MISCELLANEOUS) ×2 IMPLANT

## 2015-10-13 NOTE — Anesthesia Postprocedure Evaluation (Signed)
  Anesthesia Post-op Note  Patient: Stacey Pierce  Procedure(s) Performed: Procedure(s) with comments: CATARACT EXTRACTION PHACO AND INTRAOCULAR LENS PLACEMENT (IOC) (Right) - DIABETIC - insulin and oral meds [place]  Anesthesia type:MAC  Patient location: PACU  Post pain: Pain level controlled  Post assessment: Post-op Vital signs reviewed, Patient's Cardiovascular Status Stable, Respiratory Function Stable, Patent Airway and No signs of Nausea or vomiting  Post vital signs: Reviewed and stable  Last Vitals:  Filed Vitals:   10/13/15 0815  BP: 131/45  Pulse: 66  Temp:   Resp: 12    Level of consciousness: awake, alert  and patient cooperative  Complications: No apparent anesthesia complications

## 2015-10-13 NOTE — H&P (Signed)
  The History and Physical notes are on paper, have been signed, and are to be scanned. The patient remains stable and unchanged from the H&P.   Previous H&P reviewed, patient examined, and there are no changes.  Stacey Pierce 10/13/2015 7:35 AM

## 2015-10-13 NOTE — Transfer of Care (Signed)
Immediate Anesthesia Transfer of Care Note  Patient: Stacey Pierce  Procedure(s) Performed: Procedure(s) with comments: CATARACT EXTRACTION PHACO AND INTRAOCULAR LENS PLACEMENT (IOC) (Right) - DIABETIC - insulin and oral meds [place]  Patient Location: PACU  Anesthesia Type: MAC  Level of Consciousness: awake, alert  and patient cooperative  Airway and Oxygen Therapy: Patient Spontanous Breathing and Patient connected to supplemental oxygen  Post-op Assessment: Post-op Vital signs reviewed, Patient's Cardiovascular Status Stable, Respiratory Function Stable, Patent Airway and No signs of Nausea or vomiting  Post-op Vital Signs: Reviewed and stable  Complications: No apparent anesthesia complications

## 2015-10-13 NOTE — Op Note (Signed)
OPERATIVE NOTE  Stacey Pierce [id number] 10/13/2015   PREOPERATIVE DIAGNOSIS:    Nuclear Sclerotic Cataract Right eye with miotic pupil.        H25.11  POSTOPERATIVE DIAGNOSIS: Nuclear Sclerotic Cataract Right eye with miotic pupil.          PROCEDURE:  Phacoemusification with posterior chamber intraocular lens placement of the right eye which required pupil stretching with the Malyugin pupil expansion device.  LENS:   Implant Name Type Inv. Item Serial No. Manufacturer Lot No. LRB No. Used  IMPLANT LENS - [id number] Intraocular Lens IMPLANT LENS [id number] ALCON   Right 1    [id number] 20.5 D PCIOL   ULTRASOUND TIME: 10 % of 2 minutes 6 seconds, CDE 12.6  SURGEON:  Wyonia Hough, MD   ANESTHESIA:  Topical with tetracaine drops and 2% Xylocaine jelly, augmented with 1% preservative-free intracameral lidocaine.   COMPLICATIONS:  None.   DESCRIPTION OF PROCEDURE:  The patient was identified in the holding room and transported to the operating room and placed in the supine position under the operating microscope. Theright eye was identified as the operative eye and it was prepped and draped in the usual sterile ophthalmic fashion.   A 1 millimeter clear-corneal paracentesis was made at the 12:00 position.  0.5 ml of preservative-free 1% lidocaine was injected into the anterior chamber. The anterior chamber was filled with Viscoat viscoelastic.  A 2.4 millimeter keratome was used to make a near-clear corneal incision at the 9:00 position. A Malyugin pupil expander was then placed through the main incision and into the anterior chamber of the eye.  The edge of the iris was secured on the lip of the pupil expander and it was released, thereby expanding the pupil to approximately 7 millimeters for completion of the cataract surgery.  Additional Viscoat was placed in the anterior chamber.  A cystotome and capsulorrhexis forceps were used to make a curvilinear capsulorrhexis.   Balanced  salt solution was used to hydrodissect and hydrodelineate the lens nucleus.   Phacoemulsification was used in stop and chop fashion to remove the lens, nucleus and epinucleus.  The remaining cortex was aspirated using the irrigation aspiration handpiece.  Additional Provisc was placed into the eye to distend the capsular bag for lens placement.  A lens was then injected into the capsular bag.  The pupil expanding ring was removed using a Kuglen hook and insertion device. The remaining viscoelastic was aspirated from the capsular bag and the anterior chamber.  The anterior chamber was filled with balanced salt solution to inflate to a physiologic pressure.  Wounds were hydrated with balanced salt solution.  The anterior chamber was inflated to a physiologic pressure with balanced salt solution.  No wound leaks were noted.Cefuroxime 0.1 ml of a 10mg /ml solution was injected into the anterior chamber for a dose of 1 mg of intracameral antibiotic at the completion of the case. Timolol and Brimonidine drops were applied to the eye.  The patient was taken to the recovery room in stable condition without complications of anesthesia or surgery.  Stacey Pierce 10/13/2015, 8:09 AM

## 2015-10-13 NOTE — Anesthesia Preprocedure Evaluation (Addendum)
Anesthesia Evaluation  Patient identified by MRN, date of birth, ID band Patient awake    Reviewed: Allergy & Precautions, NPO status , Patient's Chart, lab work & pertinent test results, reviewed documented beta blocker date and time   History of Anesthesia Complications Negative for: history of anesthetic complications  Airway Mallampati: II  TM Distance: >3 FB Neck ROM: Full    Dental no notable dental hx.    Pulmonary neg pulmonary ROS,    Pulmonary exam normal        Cardiovascular hypertension, + CAD  + Valvular Problems/Murmurs AS  Rhythm:Regular Rate:Normal + Systolic murmurs TTE [id number]:  - Left ventricle: The cavity size was normal. There was moderate concentric hypertrophy. Systolic function was normal. The estimated ejection fraction was in the range of 55% to 60%. Wall motion was normal; there were no regional wall motion abnormalities. - Aortic valve: There was moderate to severe stenosis. Mean gradient (S): 35 mm Hg. Peak gradient (S): 58 mm Hg. Valve area (VTI): 1.04 cm^2. Valve area (Vmax): 0.96 cm^2. Valve area (Vmean): 0.98 cm^2. - Mitral valve: Moderately calcified annulus. - Left atrium: The atrium was mildly dilated.    Neuro/Psych negative neurological ROS  negative psych ROS   GI/Hepatic Neg liver ROS, GERD  ,  Endo/Other  diabetes, Insulin DependentHypothyroidism   Renal/GU CRFRenal disease     Musculoskeletal  (+) Arthritis , Osteoarthritis,    Abdominal   Peds  Hematology negative hematology ROS (+)   Anesthesia Other Findings   Reproductive/Obstetrics                            Anesthesia Physical Anesthesia Plan  ASA: III  Anesthesia Plan: MAC   Post-op Pain Management:    Induction: Intravenous  Airway Management Planned:   Additional Equipment:   Intra-op Plan:   Post-operative Plan:   Informed Consent: I have reviewed the  patients History and Physical, chart, labs and discussed the procedure including the risks, benefits and alternatives for the proposed anesthesia with the patient or authorized representative who has indicated his/her understanding and acceptance.     Plan Discussed with: CRNA  Anesthesia Plan Comments:         Anesthesia Quick Evaluation

## 2015-10-14 ENCOUNTER — Encounter: Payer: Self-pay | Admitting: Ophthalmology

## 2015-10-25 ENCOUNTER — Other Ambulatory Visit: Payer: Self-pay | Admitting: Family Medicine

## 2015-10-27 ENCOUNTER — Other Ambulatory Visit: Payer: Self-pay | Admitting: Family Medicine

## 2015-11-19 DIAGNOSIS — I350 Nonrheumatic aortic (valve) stenosis: Secondary | ICD-10-CM | POA: Diagnosis not present

## 2015-11-25 DIAGNOSIS — Z961 Presence of intraocular lens: Secondary | ICD-10-CM | POA: Diagnosis not present

## 2015-12-20 ENCOUNTER — Encounter: Payer: Medicare Other | Admitting: Thoracic Surgery (Cardiothoracic Vascular Surgery)

## 2015-12-20 ENCOUNTER — Ambulatory Visit: Payer: Medicare Other | Admitting: Thoracic Surgery (Cardiothoracic Vascular Surgery)

## 2015-12-21 ENCOUNTER — Other Ambulatory Visit: Payer: Self-pay | Admitting: Family Medicine

## 2015-12-24 DIAGNOSIS — N183 Chronic kidney disease, stage 3 (moderate): Secondary | ICD-10-CM | POA: Diagnosis not present

## 2015-12-24 DIAGNOSIS — E1122 Type 2 diabetes mellitus with diabetic chronic kidney disease: Secondary | ICD-10-CM | POA: Diagnosis not present

## 2015-12-24 DIAGNOSIS — Z794 Long term (current) use of insulin: Secondary | ICD-10-CM | POA: Diagnosis not present

## 2015-12-27 ENCOUNTER — Encounter: Payer: Self-pay | Admitting: Thoracic Surgery (Cardiothoracic Vascular Surgery)

## 2015-12-27 ENCOUNTER — Ambulatory Visit (INDEPENDENT_AMBULATORY_CARE_PROVIDER_SITE_OTHER): Payer: Medicare Other | Admitting: Thoracic Surgery (Cardiothoracic Vascular Surgery)

## 2015-12-27 VITALS — BP 166/69 | HR 77 | Resp 16 | Ht 66.0 in | Wt 189.0 lb

## 2015-12-27 DIAGNOSIS — I350 Nonrheumatic aortic (valve) stenosis: Secondary | ICD-10-CM

## 2015-12-27 NOTE — Patient Instructions (Signed)

## 2015-12-27 NOTE — Progress Notes (Signed)
[address]       [place] [address]             [phone]     CARDIOTHORACIC SURGERY OFFICE NOTE  Referring Provider is Corey Skains, MD PCP is Wilhemena Durie, MD   HPI:  Patient is a 75 year old moderately obese female with aortic stenosis, hypertension, type 2 diabetes mellitus, hyperlipidemia, and early stage II chronic kidney disease who returns to the office today for follow-up of aortic stenosis. She was originally seen in consultation on 05/11/2015.  Prior to that an echocardiogram performed 01/07/2015 was notable for the presence of aortic stenosis with peak velocity across the aortic valve measured 3.34 m/s corresponding to a mean transvalvular gradient estimated at 26 mmHg. Left ventricular systolic function remained normal with ejection fraction estimated 55%. Diagnostic cardiac catheterization performed several 23 2016 was notable for the presence of diffuse nonobstructive coronary artery disease. Mean transvalvular gradient measured across the aortic valve was reported 30 mmHg corresponding to aortic valve area estimated 1.49 cm.  Follow-up echocardiogram performed 05/31/2015 revealed similar findings, with peak velocity across the aortic valve measured 3.8 m/s corresponding to mean transvalvular gradient estimated 35 mmHg. The dimension was velocity ratio was measured 0.27.  Left ventricular ejection fraction was again estimated 55%. The patient underwent dobutamine stress echocardiogram which demonstrated a normal blood pressure response with stress and increasing mean transvalvular gradient which reached 48 mmHg at peak stress. At the time the patient claimed to remain entirely asymptomatic and she elected to hold off on any plans for aortic valve replacement as long as possible.  She returns for office for routine follow-up today. She states that over the past 6 months she has felt essentially the same. She does admit that if she goes up a  flight of stairs she will be short of breath by the time she gets to the top step. She states that she simply doesn't go up any stairs anymore. With normal activities around the house she denies any symptoms of exertional shortness of breath or chest discomfort. She admits that she lives a very sedentary lifestyle and she chooses to avoid any sort of physical activity. She states that if she bends over at the waist she will feel dizzy when she stands up. She otherwise denies any dizzy spells or syncope. She has not had any exertional chest discomfort. The remainder of her review of systems is unchanged.   Current Outpatient Prescriptions  Medication Sig Dispense Refill  . amitriptyline (ELAVIL) 10 MG tablet Take 10 mg by mouth at bedtime.    Marland Kitchen amLODipine (NORVASC) 5 MG tablet TAKE 1 TABLET BY MOUTH DAILY 30 tablet 12  . aspirin 81 MG tablet Take by mouth.    Marland Kitchen atorvastatin (LIPITOR) 40 MG tablet TAKE 1 TABLET BY MOUTH DAILY 30 tablet 11  . carvedilol (COREG) 25 MG tablet TAKE ONE TABLET BY MOUTH ONCE DAILY 90 tablet 3  . diphenhydrAMINE (BENADRYL) 25 MG tablet Take 25 mg by mouth every 6 (six) hours as needed.    . dorzolamide-timolol (COSOPT) 22.3-6.8 MG/ML ophthalmic solution     . hydrochlorothiazide (HYDRODIURIL) 25 MG tablet Take 25 mg by mouth daily.    . insulin detemir (LEVEMIR) 100 UNIT/ML injection Inject into the skin 2 (two) times daily. 20 units in the am and 60 units at hs    . latanoprost (XALATAN) 0.005 % ophthalmic solution 1 drop at bedtime.    Marland Kitchen levothyroxine (SYNTHROID,  LEVOTHROID) 100 MCG tablet Take 1 tablet (100 mcg total) by mouth daily. 90 tablet 3  . losartan (COZAAR) 100 MG tablet TAKE 1 TABLET BY MOUTH EVERY DAY 90 tablet 2  . metFORMIN (GLUCOPHAGE) 1000 MG tablet Take 2,500 mg by mouth at bedtime.     . MULTIPLE VITAMIN PO Take by mouth.    Marland Kitchen NOVOLOG 100 UNIT/ML injection 30 Units. 35 units am(breakfast) and 25 units pm(dinner)  5  . omeprazole (PRILOSEC OTC) 20 MG  tablet Take by mouth.    . timolol (TIMOPTIC) 0.25 % ophthalmic solution Apply to eye.    . traMADol (ULTRAM) 50 MG tablet Take by mouth.     No current facility-administered medications for this visit.      Physical Exam:   BP 166/69 mmHg  Pulse 77  Resp 16  Ht 5\' 6"  (1.676 m)  Wt 189 lb (85.73 kg)  BMI 30.52 kg/m2  SpO2 98%  General:  Obese but well appearing  Chest:   Clear to auscultation  CV:   Regular rate and rhythm with grade III/VI crescendo/decrescendo systolic murmur  Incisions:  n/a  Abdomen:  Soft nontender  Extremities:  Warm and well-perfused  Diagnostic Tests:  TRANSTHORACIC ECHOCARDIOGRAM  Both the report and images from follow-up transthoracic echocardiogram performed 11/19/2015 at the current nodal clinic are reviewed. The patient has at least moderate aortic stenosis with peak velocity across the aortic valve measured 3.7 m/s corresponding to mean transvalvular gradient estimated 31.6 mmHg. Left ventricular systolic function remains preserved with ejection fraction estimated 55%. No new abnormalities are appreciated. These results are similar in comparison with echocardiograms performed previously.   Impression:  Patient has functionally bicuspid aortic valve with aortic stenosis. She admits to occasional symptoms of shortness of breath with more strenuous physical exertion, such as going up a flight of stairs. Transthoracic echocardiograms demonstrate stable findings consistent with aortic stenosis that is at least moderate and probably bordering on severe. Left ventricular systolic function remains normal. Options include proceeding with aortic valve replacement in the near future versus continued close observation with follow-up. Risks associated with conventional surgical aortic valve replacement would be moderately elevated because of the patient's age and associated comorbid medical problems. Transcatheter aortic valve replacement could be considered as a  less invasive and somewhat less risky alternative.  Plan:  The patient and her husband were again counseled at length regarding treatment alternatives for management of severe symptomatically aortic stenosis including continued medical therapy with close follow-up versus proceeding with aortic valve replacement in the near future. The natural history of aortic stenosis was reviewed as well as long-term prognosis with medical therapy alone. Surgical options were discussed at length including both conventional surgical aortic valve replacement or transcatheter aortic valve replacement. The patient does not wish to proceed with surgery in the near future and wants to hold off as long as possible. I have stressed to her the importance of close medical follow-up as well as the concept that it would be a mistake to continue to progressively decrease any attempts at regular physical activity in effort to avoid symptoms. As she becomes less active and less mobile her functional status will begin to decline. All of her questions have been addressed. The patient will return in 6 months for routine follow-up with repeat echocardiogram to be performed by Dr. Nehemiah Massed prior to her visit.  I spent in excess of 15 minutes during the conduct of this office consultation and >50% of this time involved direct  face-to-face encounter with the patient for counseling and/or coordination of their care.    Valentina Gu. Roxy Manns, MD 12/27/2015 12:34 PM

## 2015-12-31 DIAGNOSIS — E113293 Type 2 diabetes mellitus with mild nonproliferative diabetic retinopathy without macular edema, bilateral: Secondary | ICD-10-CM | POA: Diagnosis not present

## 2015-12-31 DIAGNOSIS — I10 Essential (primary) hypertension: Secondary | ICD-10-CM | POA: Diagnosis not present

## 2015-12-31 DIAGNOSIS — N183 Chronic kidney disease, stage 3 (moderate): Secondary | ICD-10-CM | POA: Diagnosis not present

## 2015-12-31 DIAGNOSIS — E1122 Type 2 diabetes mellitus with diabetic chronic kidney disease: Secondary | ICD-10-CM | POA: Diagnosis not present

## 2015-12-31 DIAGNOSIS — R809 Proteinuria, unspecified: Secondary | ICD-10-CM | POA: Diagnosis not present

## 2016-02-03 DIAGNOSIS — Z1211 Encounter for screening for malignant neoplasm of colon: Secondary | ICD-10-CM | POA: Diagnosis not present

## 2016-02-03 DIAGNOSIS — Z803 Family history of malignant neoplasm of breast: Secondary | ICD-10-CM | POA: Diagnosis not present

## 2016-02-03 DIAGNOSIS — Z01419 Encounter for gynecological examination (general) (routine) without abnormal findings: Secondary | ICD-10-CM | POA: Diagnosis not present

## 2016-02-17 ENCOUNTER — Ambulatory Visit (INDEPENDENT_AMBULATORY_CARE_PROVIDER_SITE_OTHER): Payer: Medicare Other | Admitting: Family Medicine

## 2016-02-17 VITALS — BP 160/84 | HR 76 | Temp 97.6°F | Resp 16 | Wt 195.0 lb

## 2016-02-17 DIAGNOSIS — I10 Essential (primary) hypertension: Secondary | ICD-10-CM | POA: Diagnosis not present

## 2016-02-17 MED ORDER — CARVEDILOL 25 MG PO TABS: 25.0000 mg | ORAL_TABLET | Freq: Two times a day (BID) | ORAL | Status: DC

## 2016-02-17 NOTE — Progress Notes (Signed)
Patient ID: Stacey Pierce, female   DOB: 03-18-1941, 75 y.o.   MRN: [id number]   JAIDE DEVEAUX  MRN: [id number] DOB: [birth year]  Subjective:  HPI  The patient is a 75 year old female who presents for 6 month follow up.  She was last seen in September and no management changes were made at that time.  She was seen recently by her Amado Coe and had her Mammogram and Pap Smear.  She sees Endocrinology-[facility] for her diabetes, Nephrology-[facility]  For her kidney disease and Cardiology-[facility] for her heart disease and she is now in the process of work and preparation for Aortic valve replacent by her surgeon- Darylene Price.  Her eye doctor is [place]  Patient Active Problem List   Diagnosis Date Noted  . Allergic rhinitis 08/26/2015  . Airway hyperreactivity 08/26/2015  . Back pain, chronic 08/26/2015  . Diabetes ([place]) 08/26/2015  . Degeneration of lumbar or lumbosacral intervertebral disc 08/26/2015  . Acid reflux 08/26/2015  . Glaucoma 08/26/2015  . Cardiac murmur 08/26/2015  . HLD (hyperlipidemia) 08/26/2015  . Adiposity 08/26/2015  . Severe aortic stenosis 05/10/2015  . Benign essential HTN 04/29/2015  . Arteriosclerosis of coronary artery 01/26/2015  . Retinopathy 11/19/2014  . Chronic kidney disease (CKD), stage III (moderate) 11/19/2014    Past Medical History  Diagnosis Date  . Hyperlipidemia   . Hypertension   . Hypothyroidism   . Aortic stenosis   . DR (diabetic retinopathy) ([place])   . Glaucoma   . Obesity   . Long-term insulin use ([place])   . Microalbuminuria   . Diabetes mellitus ([place])     Takes metformin  . Renal insufficiency     diabetic related, "starting to fail"-per patient.(6.30.16)  . Airway hyperreactivity 08/26/2015  . GERD (gastroesophageal reflux disease)   . Environmental allergies     Social History   Social History  . Marital Status: Married    Spouse Name: N/A  . Number of Children: N/A  . Years of Education: N/A   Occupational  History  . Not on file.   Social History Main Topics  . Smoking status: Never Smoker   . Smokeless tobacco: Never Used  . Alcohol Use: No  . Drug Use: No  . Sexual Activity: Not on file   Other Topics Concern  . Not on file   Social History Narrative    Outpatient Prescriptions Prior to Visit  Medication Sig Dispense Refill  . amitriptyline (ELAVIL) 10 MG tablet Take 10 mg by mouth at bedtime.    Marland Kitchen amLODipine (NORVASC) 5 MG tablet TAKE 1 TABLET BY MOUTH DAILY 30 tablet 12  . aspirin 81 MG tablet Take by mouth.    Marland Kitchen atorvastatin (LIPITOR) 40 MG tablet TAKE 1 TABLET BY MOUTH DAILY 30 tablet 11  . carvedilol (COREG) 25 MG tablet TAKE ONE TABLET BY MOUTH ONCE DAILY 90 tablet 3  . diphenhydrAMINE (BENADRYL) 25 MG tablet Take 25 mg by mouth every 6 (six) hours as needed.    . hydrochlorothiazide (HYDRODIURIL) 25 MG tablet Take 25 mg by mouth daily.    . insulin detemir (LEVEMIR) 100 UNIT/ML injection Inject into the skin 2 (two) times daily. 20 units in the am and 60 units at hs    . latanoprost (XALATAN) 0.005 % ophthalmic solution 1 drop at bedtime.    Marland Kitchen levothyroxine (SYNTHROID, LEVOTHROID) 100 MCG tablet Take 1 tablet (100 mcg total) by mouth daily. 90 tablet 3  . losartan (COZAAR) 100 MG tablet  TAKE 1 TABLET BY MOUTH EVERY DAY 90 tablet 2  . metFORMIN (GLUCOPHAGE) 1000 MG tablet Take 2,500 mg by mouth at bedtime.     Marland Kitchen omeprazole (PRILOSEC OTC) 20 MG tablet Take by mouth.    . timolol (TIMOPTIC) 0.25 % ophthalmic solution Apply to eye.    . dorzolamide-timolol (COSOPT) 22.3-6.8 MG/ML ophthalmic solution     . MULTIPLE VITAMIN PO Take by mouth.    Marland Kitchen NOVOLOG 100 UNIT/ML injection 30 Units. 35 units am(breakfast) and 25 units pm(dinner)  5  . traMADol (ULTRAM) 50 MG tablet Take by mouth.     No facility-administered medications prior to visit.   Outpatient Encounter Prescriptions as of 02/17/2016  Medication Sig Note  . amitriptyline (ELAVIL) 10 MG tablet Take 10 mg by mouth at  bedtime.   Marland Kitchen amLODipine (NORVASC) 5 MG tablet TAKE 1 TABLET BY MOUTH DAILY   . aspirin 81 MG tablet Take by mouth. 08/26/2015: Received from: [facility]  . atorvastatin (LIPITOR) 40 MG tablet TAKE 1 TABLET BY MOUTH DAILY   . carvedilol (COREG) 25 MG tablet TAKE ONE TABLET BY MOUTH ONCE DAILY   . diphenhydrAMINE (BENADRYL) 25 MG tablet Take 25 mg by mouth every 6 (six) hours as needed.   . hydrochlorothiazide (HYDRODIURIL) 25 MG tablet Take 25 mg by mouth daily.   . insulin detemir (LEVEMIR) 100 UNIT/ML injection Inject into the skin 2 (two) times daily. 20 units in the am and 60 units at hs   . insulin lispro (HUMALOG) 100 UNIT/ML injection Inject into the skin 3 (three) times daily before meals.   . latanoprost (XALATAN) 0.005 % ophthalmic solution 1 drop at bedtime.   Marland Kitchen levothyroxine (SYNTHROID, LEVOTHROID) 100 MCG tablet Take 1 tablet (100 mcg total) by mouth daily.   Marland Kitchen losartan (COZAAR) 100 MG tablet TAKE 1 TABLET BY MOUTH EVERY DAY   . metFORMIN (GLUCOPHAGE) 1000 MG tablet Take 2,500 mg by mouth at bedtime.    Marland Kitchen omeprazole (PRILOSEC OTC) 20 MG tablet Take by mouth. 08/26/2015: Received from: [facility]  . timolol (TIMOPTIC) 0.25 % ophthalmic solution Apply to eye. 08/26/2015: Received from: [facility]  . [DISCONTINUED] dorzolamide-timolol (COSOPT) 22.3-6.8 MG/ML ophthalmic solution  05/11/2015: Received from: External Pharmacy  . [DISCONTINUED] MULTIPLE VITAMIN PO Take by mouth. 08/26/2015: Received from: [facility]  . [DISCONTINUED] NOVOLOG 100 UNIT/ML injection 30 Units. 35 units am(breakfast) and 25 units pm(dinner) 05/11/2015: Received from: External Pharmacy  . [DISCONTINUED] traMADol (ULTRAM) 50 MG tablet Take by mouth. 08/26/2015: Received from: [facility]   No facility-administered encounter medications on file as of 02/17/2016.      Allergies  Allergen Reactions  . Codeine Other (See  Comments)    colic  . Sulfa Antibiotics Other (See Comments)    colic  . Azithromycin Rash  . Erythromycin Rash  . Penicillins Rash    Review of Systems  Constitutional: Negative for fever and malaise/fatigue.  Eyes: Negative.   Respiratory: Positive for cough (allergy related). Negative for shortness of breath and wheezing.   Cardiovascular: Negative for chest pain, palpitations, orthopnea and leg swelling.  Gastrointestinal: Negative.   Skin: Negative.   Neurological: Negative for dizziness, weakness and headaches.   Objective:  BP 160/84 mmHg  Pulse 76  Temp(Src) 97.6 F (36.4 C) (Oral)  Resp 16  Wt 195 lb (88.451 kg)  Physical Exam  Constitutional: She is oriented to person, place, and time and well-developed, well-nourished, and in no distress.  Mildly obese white female in no acute distress.  HENT:  Head: Normocephalic and atraumatic.  Right Ear: External ear normal.  Left Ear: External ear normal.  Nose: Nose normal.  Eyes: Conjunctivae are normal.  Neck: Neck supple. Thyromegaly present.  Cardiovascular: Normal rate, regular rhythm and intact distal pulses.   Murmur (II/VI RUSB) heard. Pulmonary/Chest: Effort normal and breath sounds normal.  Abdominal: Soft.  Lymphadenopathy:    She has no cervical adenopathy.  Neurological: She is alert and oriented to person, place, and time.  Skin: Skin is warm and dry.  Psychiatric: Mood, memory, affect and judgment normal.    Assessment and Plan :   1. Benign essential HTN  - carvedilol (COREG) 25 MG tablet; Take 1 tablet (25 mg total) by mouth 2 (two) times daily with a meal.  Dispense: 180 tablet; Refill: 3 2.Obesity Diet and exercise changes discussed. 3.Type 2 diabetes Per endocrinology. 4. Valvular heart disease Patient will need surgery for this problem later on this year.  Miguel Aschoff MD [facility] 02/17/2016 11:48 AM

## 2016-02-24 DIAGNOSIS — H401132 Primary open-angle glaucoma, bilateral, moderate stage: Secondary | ICD-10-CM | POA: Diagnosis not present

## 2016-03-02 DIAGNOSIS — E113293 Type 2 diabetes mellitus with mild nonproliferative diabetic retinopathy without macular edema, bilateral: Secondary | ICD-10-CM | POA: Diagnosis not present

## 2016-03-03 ENCOUNTER — Encounter: Payer: Self-pay | Admitting: Family Medicine

## 2016-03-16 DIAGNOSIS — N183 Chronic kidney disease, stage 3 (moderate): Secondary | ICD-10-CM | POA: Diagnosis not present

## 2016-03-16 DIAGNOSIS — I10 Essential (primary) hypertension: Secondary | ICD-10-CM | POA: Diagnosis not present

## 2016-03-16 DIAGNOSIS — E1122 Type 2 diabetes mellitus with diabetic chronic kidney disease: Secondary | ICD-10-CM | POA: Diagnosis not present

## 2016-04-13 ENCOUNTER — Ambulatory Visit (INDEPENDENT_AMBULATORY_CARE_PROVIDER_SITE_OTHER): Payer: Medicare Other | Admitting: Family Medicine

## 2016-04-13 ENCOUNTER — Encounter: Payer: Self-pay | Admitting: Family Medicine

## 2016-04-13 VITALS — BP 118/48 | HR 58 | Temp 98.3°F | Resp 14 | Wt 197.0 lb

## 2016-04-13 DIAGNOSIS — E669 Obesity, unspecified: Secondary | ICD-10-CM

## 2016-04-13 DIAGNOSIS — I10 Essential (primary) hypertension: Secondary | ICD-10-CM

## 2016-04-13 DIAGNOSIS — E118 Type 2 diabetes mellitus with unspecified complications: Secondary | ICD-10-CM

## 2016-04-13 NOTE — Progress Notes (Signed)
Patient ID: Stacey Pierce, female   DOB: 1941/03/06, 75 y.o.   MRN: [id number]    Subjective:  HPI  Patient is here for 2 months for HTN.  We increased Carvedilol to 1 tablet twice daily in march, in April or around there she went to see Dr .Juleen China for regular check up and felt swimmy headed but states she has been battling a cold also and her b/p then was 91/55. After that visit she stopped taking medication twice daily and went back to 1 tablet daily until about 2 to 3 weeks ago she re started twice daily again and so far felt fine. She has not been checking her b/p. She feels fine today no cardiac symptoms. BP Readings from Last 3 Encounters:  04/13/16 118/48  02/17/16 160/84  12/27/15 166/69    Prior to Admission medications   Medication Sig Start Date End Date Taking? Authorizing Provider  amitriptyline (ELAVIL) 10 MG tablet Take 10 mg by mouth at bedtime.   Yes Historical Provider, MD  amLODipine (NORVASC) 5 MG tablet TAKE 1 TABLET BY MOUTH DAILY 12/21/15  Yes Jerrol Banana., MD  aspirin 81 MG tablet Take by mouth. 09/30/11  Yes Historical Provider, MD  atorvastatin (LIPITOR) 40 MG tablet TAKE 1 TABLET BY MOUTH DAILY 10/25/15  Yes Shiva Sahagian Maceo Pro., MD  carvedilol (COREG) 25 MG tablet Take 1 tablet (25 mg total) by mouth 2 (two) times daily with a meal. 02/17/16  Yes Jerrol Banana., MD  diphenhydrAMINE (BENADRYL) 25 MG tablet Take 25 mg by mouth every 6 (six) hours as needed.   Yes Historical Provider, MD  hydrochlorothiazide (HYDRODIURIL) 25 MG tablet Take 25 mg by mouth daily.   Yes Historical Provider, MD  insulin detemir (LEVEMIR) 100 UNIT/ML injection Inject into the skin 2 (two) times daily. 20 units in the am and 60 units at hs   Yes Historical Provider, MD  insulin lispro (HUMALOG) 100 UNIT/ML injection Inject into the skin 3 (three) times daily before meals.   Yes Historical Provider, MD  latanoprost (XALATAN) 0.005 % ophthalmic solution 1 drop at bedtime.    Yes Historical Provider, MD  levothyroxine (SYNTHROID, LEVOTHROID) 100 MCG tablet Take 1 tablet (100 mcg total) by mouth daily. 07/13/15  Yes Noa Galvao Maceo Pro., MD  losartan (COZAAR) 100 MG tablet TAKE 1 TABLET BY MOUTH EVERY DAY 10/27/15  Yes Jerrol Banana., MD  metFORMIN (GLUCOPHAGE) 1000 MG tablet Take 2,500 mg by mouth at bedtime.    Yes Historical Provider, MD  omeprazole (PRILOSEC OTC) 20 MG tablet Take by mouth. 09/30/11  Yes Historical Provider, MD  timolol (TIMOPTIC) 0.25 % ophthalmic solution Apply to eye. 09/30/11  Yes Historical Provider, MD    Patient Active Problem List   Diagnosis Date Noted  . Allergic rhinitis 08/26/2015  . Airway hyperreactivity 08/26/2015  . Back pain, chronic 08/26/2015  . Diabetes ([place]) 08/26/2015  . Degeneration of lumbar or lumbosacral intervertebral disc 08/26/2015  . Acid reflux 08/26/2015  . Glaucoma 08/26/2015  . Cardiac murmur 08/26/2015  . HLD (hyperlipidemia) 08/26/2015  . Adiposity 08/26/2015  . Severe aortic stenosis 05/10/2015  . Benign essential HTN 04/29/2015  . Arteriosclerosis of coronary artery 01/26/2015  . Retinopathy 11/19/2014  . Chronic kidney disease (CKD), stage III (moderate) 11/19/2014    Past Medical History  Diagnosis Date  . Hyperlipidemia   . Hypertension   . Hypothyroidism   . Aortic stenosis   . DR (diabetic retinopathy) ([place])   .  Glaucoma   . Obesity   . Long-term insulin use ([place])   . Microalbuminuria   . Diabetes mellitus ([place])     Takes metformin  . Renal insufficiency     diabetic related, "starting to fail"-per patient.(6.30.16)  . Airway hyperreactivity 08/26/2015  . GERD (gastroesophageal reflux disease)   . Environmental allergies     Social History   Social History  . Marital Status: Married    Spouse Name: N/A  . Number of Children: N/A  . Years of Education: N/A   Occupational History  . Not on file.   Social History Main Topics  . Smoking status: Never Smoker   .  Smokeless tobacco: Never Used  . Alcohol Use: No  . Drug Use: No  . Sexual Activity: Not on file   Other Topics Concern  . Not on file   Social History Narrative    Allergies  Allergen Reactions  . Codeine Other (See Comments)    colic  . Sulfa Antibiotics Other (See Comments)    colic  . Azithromycin Rash  . Erythromycin Rash  . Penicillins Rash    Review of Systems  Constitutional: Negative.   Respiratory: Negative.   Cardiovascular: Negative.   Gastrointestinal: Negative.   Musculoskeletal: Negative.   Neurological: Negative.     Immunization History  Administered Date(s) Administered  . Influenza, High Dose Seasonal PF 08/26/2015  . Pneumococcal Conjugate-13 02/25/2015  . Pneumococcal Polysaccharide-23 10/06/2013   Objective:  BP 118/48 mmHg  Pulse 58  Temp(Src) 98.3 F (36.8 C)  Resp 14  Wt 197 lb (89.359 kg)  Physical Exam  Constitutional: She is oriented to person, place, and time and well-developed, well-nourished, and in no distress.  HENT:  Head: Normocephalic and atraumatic.  Eyes: Conjunctivae are normal. Pupils are equal, round, and reactive to light.  Neck: Normal range of motion. Neck supple.  Cardiovascular: Normal rate, regular rhythm and intact distal pulses.   Murmur heard. Systolic murmurs: right upper border.  Pulmonary/Chest: Effort normal and breath sounds normal. No respiratory distress. She has no wheezes.  Musculoskeletal: She exhibits no edema or tenderness.  Neurological: She is alert and oriented to person, place, and time.  Psychiatric: Mood, memory, affect and judgment normal.    Lab Results  Component Value Date   WBC 7.5 09/24/2015   HCT 32.4* 09/24/2015   PLT 281 09/24/2015   GLUCOSE 206* 09/02/2015   CHOL 145 09/02/2015   TRIG 217* 09/02/2015   HDL 41 09/02/2015   LDLCALC 61 09/02/2015   TSH 1.630 09/02/2015    CMP     Component Value Date/Time   NA 138 09/02/2015 0931   NA 135 06/08/2015 1442   K 4.8  09/02/2015 0931   CL 100 09/02/2015 0931   CO2 21 09/02/2015 0931   GLUCOSE 206* 09/02/2015 0931   GLUCOSE 144* 06/08/2015 1442   BUN 16 09/02/2015 0931   BUN 20 06/08/2015 1442   CREATININE 1.09* 09/02/2015 0931   CREATININE 1.13* 06/08/2015 1442   CREATININE 1.2* 09/24/2014   CALCIUM 9.4 09/02/2015 0931   PROT 6.3 09/02/2015 0931   PROT 6.6 06/08/2015 1442   ALBUMIN 4.1 09/02/2015 0931   ALBUMIN 4.0 06/08/2015 1442   AST 16 09/02/2015 0931   ALT 19 09/02/2015 0931   ALKPHOS 79 09/02/2015 0931   BILITOT <0.2 09/02/2015 0931   BILITOT 0.3 06/08/2015 1442   GFRNONAA 50* 09/02/2015 0931   GFRNONAA 48* 06/08/2015 1442   GFRAA 57* 09/02/2015 0931  GFRAA 55* 06/08/2015 1442    Assessment and Plan :  1. Benign essential HTN Better. Patient advised when she had an episode of hypotension and dizziness was due to the cold symptoms she was having then too. Will follow and advised patient to stay with Carvedilol at twice daily unless she has issues with dizziness again.  2. Adiposity  3. Type 2 diabetes mellitus with complication, unspecified long term insulin use status ([place]) 4.Aortic Valve Disease 5.HLD 6.Obesity Patient was seen and examined by Dr. Eulas Post and note was scribed by Theressa Millard, RMA.    Miguel Aschoff MD [facility] 04/13/2016 11:35 AM

## 2016-04-28 DIAGNOSIS — Z794 Long term (current) use of insulin: Secondary | ICD-10-CM | POA: Diagnosis not present

## 2016-04-28 DIAGNOSIS — N183 Chronic kidney disease, stage 3 (moderate): Secondary | ICD-10-CM | POA: Diagnosis not present

## 2016-04-28 DIAGNOSIS — E1122 Type 2 diabetes mellitus with diabetic chronic kidney disease: Secondary | ICD-10-CM | POA: Diagnosis not present

## 2016-05-05 DIAGNOSIS — R809 Proteinuria, unspecified: Secondary | ICD-10-CM | POA: Diagnosis not present

## 2016-05-05 DIAGNOSIS — I10 Essential (primary) hypertension: Secondary | ICD-10-CM | POA: Diagnosis not present

## 2016-05-05 DIAGNOSIS — N183 Chronic kidney disease, stage 3 (moderate): Secondary | ICD-10-CM | POA: Diagnosis not present

## 2016-05-05 DIAGNOSIS — E113293 Type 2 diabetes mellitus with mild nonproliferative diabetic retinopathy without macular edema, bilateral: Secondary | ICD-10-CM | POA: Diagnosis not present

## 2016-05-05 DIAGNOSIS — E1122 Type 2 diabetes mellitus with diabetic chronic kidney disease: Secondary | ICD-10-CM | POA: Diagnosis not present

## 2016-05-18 DIAGNOSIS — R600 Localized edema: Secondary | ICD-10-CM | POA: Diagnosis not present

## 2016-05-18 DIAGNOSIS — I350 Nonrheumatic aortic (valve) stenosis: Secondary | ICD-10-CM | POA: Diagnosis not present

## 2016-05-18 DIAGNOSIS — N183 Chronic kidney disease, stage 3 (moderate): Secondary | ICD-10-CM | POA: Diagnosis not present

## 2016-05-18 DIAGNOSIS — I2510 Atherosclerotic heart disease of native coronary artery without angina pectoris: Secondary | ICD-10-CM | POA: Diagnosis not present

## 2016-05-18 DIAGNOSIS — Z0000 Encounter for general adult medical examination without abnormal findings: Secondary | ICD-10-CM | POA: Diagnosis not present

## 2016-05-30 ENCOUNTER — Other Ambulatory Visit: Payer: Self-pay | Admitting: Family Medicine

## 2016-06-01 DIAGNOSIS — I350 Nonrheumatic aortic (valve) stenosis: Secondary | ICD-10-CM | POA: Diagnosis not present

## 2016-06-12 ENCOUNTER — Other Ambulatory Visit: Payer: Self-pay

## 2016-06-12 MED ORDER — ATORVASTATIN CALCIUM 40 MG PO TABS: 40.0000 mg | ORAL_TABLET | Freq: Every day | ORAL | Status: DC

## 2016-06-15 DIAGNOSIS — R600 Localized edema: Secondary | ICD-10-CM | POA: Diagnosis not present

## 2016-06-15 DIAGNOSIS — I350 Nonrheumatic aortic (valve) stenosis: Secondary | ICD-10-CM | POA: Diagnosis not present

## 2016-06-15 DIAGNOSIS — I10 Essential (primary) hypertension: Secondary | ICD-10-CM | POA: Diagnosis not present

## 2016-06-19 ENCOUNTER — Ambulatory Visit (INDEPENDENT_AMBULATORY_CARE_PROVIDER_SITE_OTHER): Payer: Medicare Other | Admitting: Thoracic Surgery (Cardiothoracic Vascular Surgery)

## 2016-06-19 ENCOUNTER — Encounter: Payer: Self-pay | Admitting: Thoracic Surgery (Cardiothoracic Vascular Surgery)

## 2016-06-19 VITALS — BP 119/66 | HR 64 | Resp 16 | Ht 66.0 in | Wt 189.0 lb

## 2016-06-19 DIAGNOSIS — I350 Nonrheumatic aortic (valve) stenosis: Secondary | ICD-10-CM | POA: Diagnosis not present

## 2016-06-19 NOTE — Patient Instructions (Signed)
Continue all previous medications without any changes at this time  Call and return for appointment if you develop worsening shortness of breath, chest discomfort, or dizzy spells

## 2016-06-19 NOTE — Progress Notes (Signed)
[address]       [place] [address]             [phone]     CARDIOTHORACIC SURGERY OFFICE NOTE  Referring Provider is Corey Skains, MD PCP is Wilhemena Durie, MD   HPI:  Patient is a 75 year old moderately obese female with aortic stenosis, hypertension, type 2 diabetes mellitus, hyperlipidemia, and chronic kidney disease who presents to the office today for follow-up of aortic stenosis. She was originally seen in consultation on 05/11/2015 and she was last seen here in our office on 12/27/2015. She has been followed for several years with aortic stenosis by Dr. Nehemiah Massed in [place].  Transthoracic echocardiograms have revealed aortic stenosis that is at least moderate and bordering on severe. She returns to our office today for routine follow-up having recently been seen by Dr. Nehemiah Massed who performed a routine follow-up echocardiogram.  The patient states that she has done well over the past 6 months. 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 She bowls once a week. She only gets short of breath with more strenuous activity and she has not limited at all by symptoms of exertional shortness of breath. She has never had any chest pain or chest tightness. She gets some swelling around her ankles when she spends an entire day on her feet. She has not had any dizzy spells or syncope. She has not had any palpitations.    Current Outpatient Prescriptions  Medication Sig Dispense Refill  . amitriptyline (ELAVIL) 10 MG tablet TAKE ONE TABLET BY MOUTH AT BEDTIME 90 tablet 3  . amLODipine (NORVASC) 5 MG tablet TAKE 1 TABLET BY MOUTH DAILY 30 tablet 12  . aspirin 81 MG tablet Take by mouth.    Marland Kitchen atorvastatin (LIPITOR) 40 MG tablet Take 1 tablet (40 mg total) by mouth daily. 90 tablet 3  . carvedilol (COREG) 25 MG tablet Take 1 tablet (25 mg total) by mouth 2 (two) times daily with a meal. 180 tablet 3  . diphenhydrAMINE (BENADRYL) 25 MG tablet Take 25 mg  by mouth every 6 (six) hours as needed.    . hydrochlorothiazide (HYDRODIURIL) 25 MG tablet Take 25 mg by mouth daily.    . insulin detemir (LEVEMIR) 100 UNIT/ML injection Inject into the skin 2 (two) times daily. 20 units in the am and 60 units at hs    . insulin lispro (HUMALOG) 100 UNIT/ML injection Inject into the skin 3 (three) times daily before meals.    . latanoprost (XALATAN) 0.005 % ophthalmic solution 1 drop at bedtime.    Marland Kitchen levothyroxine (SYNTHROID, LEVOTHROID) 100 MCG tablet Take 1 tablet (100 mcg total) by mouth daily. 90 tablet 3  . losartan (COZAAR) 100 MG tablet TAKE 1 TABLET BY MOUTH EVERY DAY 90 tablet 2  . metFORMIN (GLUCOPHAGE) 1000 MG tablet Take 2,500 mg by mouth at bedtime. TAKING 2000 MG    . omeprazole (PRILOSEC OTC) 20 MG tablet Take by mouth.    . timolol (TIMOPTIC) 0.25 % ophthalmic solution Apply to eye.     No current facility-administered medications for this visit.      Physical Exam:   BP 119/66 mmHg  Pulse 64  Resp 16  Ht 5\' 6"  (1.676 m)  Wt 189 lb (85.73 kg)  BMI 30.52 kg/m2  SpO2 98%  General:  Moderately obese but well appearing  Chest:   Clear to auscultation with symmetrical breath sounds  CV:  Regular rate and rhythm with grade 3/6 systolic murmur heard best along the lower sternal border  Incisions:  n/a  Abdomen:  Soft and nontender  Extremities:  Warm and well-perfused  Diagnostic Tests:  TRANSTHORACIC ECHOCARDIOGRAM  Report from recent echocardiogram performed 06/01/2016 at Dr. Alveria Apley office is reviewed. By report the patient's left ventricular function and aortic valve disease appeared stable. The patient has normal systolic function with ejection fraction greater than 55%. Peak velocity across the aortic valve was reported 3.7 m/s corresponding to mean transvalvular gradient estimated 31 mmHg. This is unchanged over the past 6 months. There is mild aortic insufficiency and mild mitral regurgitation. There is moderate left  ventricular hypertrophy. No other significant abnormalities were noted.   Impression:  The patient has a functionally bicuspid aortic valve with at least moderate aortic stenosis, bordering on severe. By report the patient's recent echocardiogram remains unchanged over the past 6 months. Clinically she remains stable with occasional symptoms of shortness of breath that occur only with more strenuous physical exertion.    Plan:  I agree with plans previously outlined by Dr. Nehemiah Massed to continue to follow the patient closely with serial echocardiography. The patient plans to return to see Dr. Nehemiah Massed in 6 months. We will have her return to our office in one year. She will call and return sooner if she develops worsening symptoms of exertional shortness of breath, chest discomfort, dizzy spells, or syncope. She will also call and return if follow-up echocardiogram performed in Dr. Alveria Apley office demonstrates significant progression of disease. All of her questions have been addressed.  I spent in excess of 15 minutes during the conduct of this office consultation and >50% of this time involved direct face-to-face encounter with the patient for counseling and/or coordination of their care.   Valentina Gu. Roxy Manns, MD 06/19/2016 2:15 PM

## 2016-06-30 ENCOUNTER — Other Ambulatory Visit: Payer: Self-pay

## 2016-06-30 MED ORDER — AMLODIPINE BESYLATE 5 MG PO TABS: 5.0000 mg | ORAL_TABLET | Freq: Every day | ORAL | 3 refills | Status: DC

## 2016-07-26 DIAGNOSIS — E162 Hypoglycemia, unspecified: Secondary | ICD-10-CM | POA: Diagnosis not present

## 2016-07-27 ENCOUNTER — Encounter: Payer: Self-pay | Admitting: Emergency Medicine

## 2016-07-27 ENCOUNTER — Emergency Department: Payer: Medicare Other

## 2016-07-27 ENCOUNTER — Observation Stay
Admission: EM | Admit: 2016-07-27 | Discharge: 2016-07-28 | Disposition: A | Discharge status: Home / Self Care | Payer: Medicare Other | Attending: Internal Medicine | Admitting: Internal Medicine

## 2016-07-27 DIAGNOSIS — Z961 Presence of intraocular lens: Secondary | ICD-10-CM | POA: Insufficient documentation

## 2016-07-27 DIAGNOSIS — E162 Hypoglycemia, unspecified: Secondary | ICD-10-CM | POA: Diagnosis present

## 2016-07-27 DIAGNOSIS — Z7982 Long term (current) use of aspirin: Secondary | ICD-10-CM | POA: Insufficient documentation

## 2016-07-27 DIAGNOSIS — E039 Hypothyroidism, unspecified: Secondary | ICD-10-CM | POA: Insufficient documentation

## 2016-07-27 DIAGNOSIS — E10649 Type 1 diabetes mellitus with hypoglycemia without coma: Secondary | ICD-10-CM | POA: Diagnosis not present

## 2016-07-27 DIAGNOSIS — E11649 Type 2 diabetes mellitus with hypoglycemia without coma: Principal | ICD-10-CM | POA: Insufficient documentation

## 2016-07-27 DIAGNOSIS — Z794 Long term (current) use of insulin: Secondary | ICD-10-CM | POA: Diagnosis not present

## 2016-07-27 DIAGNOSIS — Z803 Family history of malignant neoplasm of breast: Secondary | ICD-10-CM | POA: Insufficient documentation

## 2016-07-27 DIAGNOSIS — Z818 Family history of other mental and behavioral disorders: Secondary | ICD-10-CM | POA: Insufficient documentation

## 2016-07-27 DIAGNOSIS — E11319 Type 2 diabetes mellitus with unspecified diabetic retinopathy without macular edema: Secondary | ICD-10-CM | POA: Diagnosis not present

## 2016-07-27 DIAGNOSIS — Z9889 Other specified postprocedural states: Secondary | ICD-10-CM | POA: Insufficient documentation

## 2016-07-27 DIAGNOSIS — N289 Disorder of kidney and ureter, unspecified: Secondary | ICD-10-CM | POA: Diagnosis not present

## 2016-07-27 DIAGNOSIS — S0003XA Contusion of scalp, initial encounter: Secondary | ICD-10-CM | POA: Insufficient documentation

## 2016-07-27 DIAGNOSIS — Z881 Allergy status to other antibiotic agents status: Secondary | ICD-10-CM | POA: Insufficient documentation

## 2016-07-27 DIAGNOSIS — I2510 Atherosclerotic heart disease of native coronary artery without angina pectoris: Secondary | ICD-10-CM | POA: Diagnosis not present

## 2016-07-27 DIAGNOSIS — Z882 Allergy status to sulfonamides status: Secondary | ICD-10-CM | POA: Diagnosis not present

## 2016-07-27 DIAGNOSIS — Z8371 Family history of colonic polyps: Secondary | ICD-10-CM | POA: Insufficient documentation

## 2016-07-27 DIAGNOSIS — E785 Hyperlipidemia, unspecified: Secondary | ICD-10-CM | POA: Diagnosis not present

## 2016-07-27 DIAGNOSIS — X58XXXA Exposure to other specified factors, initial encounter: Secondary | ICD-10-CM | POA: Insufficient documentation

## 2016-07-27 DIAGNOSIS — Z8249 Family history of ischemic heart disease and other diseases of the circulatory system: Secondary | ICD-10-CM | POA: Insufficient documentation

## 2016-07-27 DIAGNOSIS — K219 Gastro-esophageal reflux disease without esophagitis: Secondary | ICD-10-CM | POA: Diagnosis not present

## 2016-07-27 DIAGNOSIS — Z9841 Cataract extraction status, right eye: Secondary | ICD-10-CM | POA: Insufficient documentation

## 2016-07-27 DIAGNOSIS — T68XXXA Hypothermia, initial encounter: Secondary | ICD-10-CM | POA: Insufficient documentation

## 2016-07-27 DIAGNOSIS — Z6832 Body mass index (BMI) 32.0-32.9, adult: Secondary | ICD-10-CM | POA: Insufficient documentation

## 2016-07-27 DIAGNOSIS — I350 Nonrheumatic aortic (valve) stenosis: Secondary | ICD-10-CM | POA: Diagnosis not present

## 2016-07-27 DIAGNOSIS — Z9049 Acquired absence of other specified parts of digestive tract: Secondary | ICD-10-CM | POA: Insufficient documentation

## 2016-07-27 DIAGNOSIS — F329 Major depressive disorder, single episode, unspecified: Secondary | ICD-10-CM | POA: Diagnosis not present

## 2016-07-27 DIAGNOSIS — Z885 Allergy status to narcotic agent status: Secondary | ICD-10-CM | POA: Insufficient documentation

## 2016-07-27 DIAGNOSIS — E1139 Type 2 diabetes mellitus with other diabetic ophthalmic complication: Secondary | ICD-10-CM | POA: Diagnosis not present

## 2016-07-27 DIAGNOSIS — W19XXXA Unspecified fall, initial encounter: Secondary | ICD-10-CM | POA: Diagnosis not present

## 2016-07-27 DIAGNOSIS — H409 Unspecified glaucoma: Secondary | ICD-10-CM | POA: Insufficient documentation

## 2016-07-27 DIAGNOSIS — Z833 Family history of diabetes mellitus: Secondary | ICD-10-CM | POA: Insufficient documentation

## 2016-07-27 DIAGNOSIS — E669 Obesity, unspecified: Secondary | ICD-10-CM | POA: Insufficient documentation

## 2016-07-27 DIAGNOSIS — Z880 Allergy status to penicillin: Secondary | ICD-10-CM | POA: Insufficient documentation

## 2016-07-27 DIAGNOSIS — S069X9A Unspecified intracranial injury with loss of consciousness of unspecified duration, initial encounter: Secondary | ICD-10-CM | POA: Diagnosis not present

## 2016-07-27 DIAGNOSIS — Z9842 Cataract extraction status, left eye: Secondary | ICD-10-CM | POA: Diagnosis not present

## 2016-07-27 LAB — URINALYSIS COMPLETE WITH MICROSCOPIC (ARMC ONLY)
Bilirubin Urine: NEGATIVE
Glucose, UA: 150 mg/dL — AB
HGB URINE DIPSTICK: NEGATIVE
KETONES UR: NEGATIVE mg/dL
Nitrite: NEGATIVE
PH: 5 (ref 5.0–8.0)
PROTEIN: 30 mg/dL — AB
Specific Gravity, Urine: 1.012 (ref 1.005–1.030)

## 2016-07-27 LAB — GLUCOSE, CAPILLARY
Glucose-Capillary: 92 mg/dL (ref 65–99)
Glucose-Capillary: 98 mg/dL (ref 65–99)
Glucose-Capillary: 137 mg/dL — ABNORMAL HIGH (ref 65–99)
Glucose-Capillary: 185 mg/dL — ABNORMAL HIGH (ref 65–99)
Glucose-Capillary: 355 mg/dL — ABNORMAL HIGH (ref 65–99)
Glucose-Capillary: 344 mg/dL — ABNORMAL HIGH (ref 65–99)
Glucose-Capillary: 278 mg/dL — ABNORMAL HIGH (ref 65–99)
Glucose-Capillary: 214 mg/dL — ABNORMAL HIGH (ref 65–99)
Glucose-Capillary: 286 mg/dL — ABNORMAL HIGH (ref 65–99)

## 2016-07-27 LAB — CBC
HCT: 33.7 % — ABNORMAL LOW (ref 35.0–47.0)
Hemoglobin: 11.5 g/dL — ABNORMAL LOW (ref 12.0–16.0)
MCH: 29.3 pg (ref 26.0–34.0)
MCHC: 34.2 g/dL (ref 32.0–36.0)
MCV: 85.6 fL (ref 80.0–100.0)
PLATELETS: 243 10*3/uL (ref 150–440)
RBC: 3.93 MIL/uL (ref 3.80–5.20)
RDW: 13.4 % (ref 11.5–14.5)
WBC: 9.1 10*3/uL (ref 3.6–11.0)

## 2016-07-27 LAB — COMPREHENSIVE METABOLIC PANEL
ALT: 18 U/L (ref 14–54)
AST: 20 U/L (ref 15–41)
Albumin: 4.1 g/dL (ref 3.5–5.0)
Alkaline Phosphatase: 66 U/L (ref 38–126)
Anion gap: 8 (ref 5–15)
BILIRUBIN TOTAL: 0.3 mg/dL (ref 0.3–1.2)
BUN: 22 mg/dL — AB (ref 6–20)
CO2: 23 mmol/L (ref 22–32)
Calcium: 9.1 mg/dL (ref 8.9–10.3)
Chloride: 108 mmol/L (ref 101–111)
Creatinine, Ser: 1.13 mg/dL — ABNORMAL HIGH (ref 0.44–1.00)
GFR calc Af Amer: 53 mL/min — ABNORMAL LOW (ref >60)
GFR calc non Af Amer: 46 mL/min — ABNORMAL LOW (ref >60)
GLUCOSE: 141 mg/dL — AB (ref 65–99)
POTASSIUM: 3.3 mmol/L — AB (ref 3.5–5.1)
SODIUM: 139 mmol/L (ref 135–145)
TOTAL PROTEIN: 7.2 g/dL (ref 6.5–8.1)

## 2016-07-27 LAB — TROPONIN I: Troponin I: <0.03 ng/mL (ref <0.03)

## 2016-07-27 LAB — TSH: TSH: 7.443 u[IU]/mL — ABNORMAL HIGH (ref 0.350–4.500)

## 2016-07-27 LAB — HEMOGLOBIN A1C: Hgb A1c MFr Bld: 7.5 % — ABNORMAL HIGH (ref 4.0–6.0)

## 2016-07-27 MED ORDER — TIMOLOL MALEATE 0.25 % OP SOLN
1.0000 [drp] | Freq: Every day | OPHTHALMIC | Status: DC
  Administered 2016-07-27 – 2016-07-28 (×2): 1 [drp] via OPHTHALMIC
  Filled 2016-07-27: qty 5

## 2016-07-27 MED ORDER — DEXTROSE 10 % IV SOLN
INTRAVENOUS | Status: DC
  Administered 2016-07-27: 01:00:00 via INTRAVENOUS

## 2016-07-27 MED ORDER — CARVEDILOL 6.25 MG PO TABS
25.0000 mg | ORAL_TABLET | Freq: Two times a day (BID) | ORAL | Status: DC
  Administered 2016-07-27 – 2016-07-28 (×3): 25 mg via ORAL
  Filled 2016-07-27 (×3): qty 4

## 2016-07-27 MED ORDER — ACETAMINOPHEN 325 MG PO TABS: 650.0000 mg | ORAL_TABLET | Freq: Four times a day (QID) | ORAL | Status: DC | PRN

## 2016-07-27 MED ORDER — SODIUM CHLORIDE 0.9% FLUSH
3.0000 mL | Freq: Two times a day (BID) | INTRAVENOUS | Status: DC
  Administered 2016-07-27 (×2): 3 mL via INTRAVENOUS

## 2016-07-27 MED ORDER — ADULT MULTIVITAMIN W/MINERALS CH
1.0000 | ORAL_TABLET | Freq: Every day | ORAL | Status: DC
  Administered 2016-07-27 – 2016-07-28 (×2): 1 via ORAL
  Filled 2016-07-27 (×2): qty 1

## 2016-07-27 MED ORDER — LEVOTHYROXINE SODIUM 50 MCG PO TABS
100.0000 ug | ORAL_TABLET | Freq: Every day | ORAL | Status: DC
  Administered 2016-07-27: 100 ug via ORAL
  Filled 2016-07-27: qty 2

## 2016-07-27 MED ORDER — POTASSIUM CHLORIDE 20 MEQ PO PACK
40.0000 meq | PACK | ORAL | Status: AC
  Administered 2016-07-27 (×2): 40 meq via ORAL
  Filled 2016-07-27 (×2): qty 2

## 2016-07-27 MED ORDER — ONDANSETRON HCL 4 MG/2ML IJ SOLN: 4.0000 mg | Freq: Four times a day (QID) | INTRAMUSCULAR | Status: DC | PRN

## 2016-07-27 MED ORDER — DEXTROSE 50 % IV SOLN
25.0000 mL | Freq: Once | INTRAVENOUS | Status: AC
  Administered 2016-07-27: 25 mL via INTRAVENOUS

## 2016-07-27 MED ORDER — AMLODIPINE BESYLATE 5 MG PO TABS
5.0000 mg | ORAL_TABLET | Freq: Every day | ORAL | Status: DC
  Administered 2016-07-27 – 2016-07-28 (×2): 5 mg via ORAL
  Filled 2016-07-27 (×2): qty 1

## 2016-07-27 MED ORDER — ACETAMINOPHEN 650 MG RE SUPP: 650.0000 mg | Freq: Four times a day (QID) | RECTAL | Status: DC | PRN

## 2016-07-27 MED ORDER — AMITRIPTYLINE HCL 10 MG PO TABS
10.0000 mg | ORAL_TABLET | Freq: Every day | ORAL | Status: DC
  Administered 2016-07-27: 10 mg via ORAL
  Filled 2016-07-27: qty 1

## 2016-07-27 MED ORDER — OMEPRAZOLE MAGNESIUM 20 MG PO TBEC: 20.0000 mg | DELAYED_RELEASE_TABLET | Freq: Every day | ORAL | Status: DC

## 2016-07-27 MED ORDER — INSULIN DETEMIR 100 UNIT/ML ~~LOC~~ SOLN
18.0000 [IU] | Freq: Two times a day (BID) | SUBCUTANEOUS | Status: DC
  Administered 2016-07-27 – 2016-07-28 (×2): 18 [IU] via SUBCUTANEOUS
  Filled 2016-07-27 (×3): qty 0.18

## 2016-07-27 MED ORDER — INSULIN ASPART 100 UNIT/ML ~~LOC~~ SOLN
0.0000 [IU] | Freq: Every day | SUBCUTANEOUS | Status: DC
  Administered 2016-07-27: 3 [IU] via SUBCUTANEOUS
  Filled 2016-07-27: qty 8

## 2016-07-27 MED ORDER — INSULIN ASPART 100 UNIT/ML ~~LOC~~ SOLN
0.0000 [IU] | Freq: Three times a day (TID) | SUBCUTANEOUS | Status: DC
  Administered 2016-07-27 – 2016-07-28 (×2): 5 [IU] via SUBCUTANEOUS
  Administered 2016-07-28: 11 [IU] via SUBCUTANEOUS
  Filled 2016-07-27 (×2): qty 5
  Filled 2016-07-27: qty 11

## 2016-07-27 MED ORDER — LOSARTAN POTASSIUM 50 MG PO TABS
100.0000 mg | ORAL_TABLET | Freq: Every day | ORAL | Status: DC
  Administered 2016-07-27 – 2016-07-28 (×2): 100 mg via ORAL
  Filled 2016-07-27 (×2): qty 2

## 2016-07-27 MED ORDER — LATANOPROST 0.005 % OP SOLN
1.0000 [drp] | Freq: Every day | OPHTHALMIC | Status: DC
  Filled 2016-07-27: qty 2.5

## 2016-07-27 MED ORDER — ATORVASTATIN CALCIUM 20 MG PO TABS
40.0000 mg | ORAL_TABLET | Freq: Every day | ORAL | Status: DC
  Administered 2016-07-27 – 2016-07-28 (×2): 40 mg via ORAL
  Filled 2016-07-27 (×2): qty 2

## 2016-07-27 MED ORDER — DEXTROSE 50 % IV SOLN: 1.0000 | Freq: Once | INTRAVENOUS | Status: DC

## 2016-07-27 MED ORDER — HYDROCHLOROTHIAZIDE 25 MG PO TABS
25.0000 mg | ORAL_TABLET | Freq: Every day | ORAL | Status: DC
  Administered 2016-07-27 – 2016-07-28 (×2): 25 mg via ORAL
  Filled 2016-07-27 (×2): qty 1

## 2016-07-27 MED ORDER — PANTOPRAZOLE SODIUM 40 MG PO TBEC
40.0000 mg | DELAYED_RELEASE_TABLET | Freq: Every day | ORAL | Status: DC
  Administered 2016-07-27 – 2016-07-28 (×2): 40 mg via ORAL
  Filled 2016-07-27 (×2): qty 1

## 2016-07-27 MED ORDER — LEVOTHYROXINE SODIUM 125 MCG PO TABS
125.0000 ug | ORAL_TABLET | Freq: Every day | ORAL | Status: DC
  Administered 2016-07-28: 125 ug via ORAL
  Filled 2016-07-27: qty 1

## 2016-07-27 MED ORDER — ENOXAPARIN SODIUM 40 MG/0.4ML ~~LOC~~ SOLN
40.0000 mg | SUBCUTANEOUS | Status: DC
  Filled 2016-07-27: qty 0.4

## 2016-07-27 MED ORDER — ASPIRIN 81 MG PO CHEW
81.0000 mg | CHEWABLE_TABLET | Freq: Every day | ORAL | Status: DC
  Administered 2016-07-27: 81 mg via ORAL
  Filled 2016-07-27: qty 1

## 2016-07-27 MED ORDER — ONDANSETRON HCL 4 MG PO TABS: 4.0000 mg | ORAL_TABLET | Freq: Four times a day (QID) | ORAL | Status: DC | PRN

## 2016-07-27 MED ORDER — TRAMADOL HCL 50 MG PO TABS: 50.0000 mg | ORAL_TABLET | Freq: Four times a day (QID) | ORAL | Status: DC | PRN

## 2016-07-27 NOTE — Progress Notes (Signed)
Pt is refusing lovenox. Dr Jannifer Franklin notified. Received order to discontinue lovenox, but pt must wear  SCD's

## 2016-07-27 NOTE — ED Notes (Signed)
Dr. Owens Shark at bedside, amp d50 given

## 2016-07-27 NOTE — ED Notes (Signed)
BG 92, Dr. Owens Shark, informed

## 2016-07-27 NOTE — H&P (Signed)
Stacey Pierce is an 75 y.o. female.   Chief Complaint: Fall HPI: The patient with a past medical history of diabetes and hypothyroidism presents to the emergency department after suffering a fall. She was found to have extremely low blood sugar and given 1 amp of D50 by EMS. She was given another half amp of D50 in the emergency department but continued to have low blood sugar. She was placed on D10 infusion which gradually improve her blood sugar. Notably, the patient was found to have a core temperature of 92.65F as well. Due to persistent hypoglycemia and hypothermia emergency department staff called the hospitalist service for admission.  Past Medical History:  Diagnosis Date  . Airway hyperreactivity 08/26/2015  . Aortic stenosis   . Diabetes mellitus ([place])    Takes metformin  . DR (diabetic retinopathy) ([place])   . Environmental allergies   . GERD (gastroesophageal reflux disease)   . Glaucoma   . Hyperlipidemia   . Hypertension   . Hypothyroidism   . Long-term insulin use ([place])   . Microalbuminuria   . Obesity   . Renal insufficiency    diabetic related, "starting to fail"-per patient.(6.30.16)    Past Surgical History:  Procedure Laterality Date  . CATARACT EXTRACTION     left 2010  . CATARACT EXTRACTION W/PHACO Right 10/13/2015   Procedure: CATARACT EXTRACTION PHACO AND INTRAOCULAR LENS PLACEMENT (IOC);  Surgeon: Leandrew Koyanagi, MD;  Location: [facility];  Service: Ophthalmology;  Laterality: Right;  DIABETIC - insulin and oral meds MALYUGIN SHUGARCAINE  . CHOLECYSTECTOMY     1967  . DILATION AND CURETTAGE OF UTERUS    . LAPAROSCOPIC TUBAL LIGATION    . TONSILLECTOMY     1974    Family History  Problem Relation Age of Onset  . Alzheimer's disease Mother   . Colon polyps Mother   . Hypertension Mother   . Heart attack Father   . Coronary artery disease Father   . Diabetes Father   . Breast cancer Sister   . Colon polyps Sister    Social History:   reports that she has never smoked. She has never used smokeless tobacco. She reports that she does not drink alcohol or use drugs.  Allergies:  Allergies  Allergen Reactions  . Codeine Other (See Comments)    colic  . Sulfa Antibiotics Other (See Comments)    colic  . Azithromycin Rash  . Erythromycin Rash  . Penicillins Rash    Medications Prior to Admission  Medication Sig Dispense Refill  . amitriptyline (ELAVIL) 10 MG tablet TAKE ONE TABLET BY MOUTH AT BEDTIME 90 tablet 3  . amLODipine (NORVASC) 5 MG tablet Take 1 tablet (5 mg total) by mouth daily. 90 tablet 3  . aspirin 81 MG tablet Take 81 mg by mouth at bedtime.     Marland Kitchen atorvastatin (LIPITOR) 40 MG tablet Take 1 tablet (40 mg total) by mouth daily. 90 tablet 3  . carvedilol (COREG) 25 MG tablet Take 1 tablet (25 mg total) by mouth 2 (two) times daily with a meal. 180 tablet 3  . hydrochlorothiazide (HYDRODIURIL) 25 MG tablet Take 25 mg by mouth daily.    . insulin detemir (LEVEMIR) 100 UNIT/ML injection Inject into the skin 2 (two) times daily. 20 units in the am and 60 units at hs    . insulin lispro (HUMALOG) 100 UNIT/ML injection Inject 25-35 Units into the skin 2 (two) times daily. 35 units qam  25 at supper    .  latanoprost (XALATAN) 0.005 % ophthalmic solution 1 drop at bedtime.    Marland Kitchen levothyroxine (SYNTHROID, LEVOTHROID) 100 MCG tablet Take 1 tablet (100 mcg total) by mouth daily. 90 tablet 3  . losartan (COZAAR) 100 MG tablet TAKE 1 TABLET BY MOUTH EVERY DAY 90 tablet 2  . metFORMIN (GLUCOPHAGE) 1000 MG tablet Take 2,000 mg by mouth daily with supper. TAKING 2000 MG    . Multiple Vitamin (MULTIVITAMIN) tablet Take 1 tablet by mouth daily.    Marland Kitchen omeprazole (PRILOSEC OTC) 20 MG tablet Take 20-40 mg by mouth daily.     . timolol (TIMOPTIC) 0.25 % ophthalmic solution Apply to eye.      Results for orders placed or performed during the hospital encounter of 07/27/16 (from the past 48 hour(s))  Glucose, capillary     Status:  None   Collection Time: 07/27/16  1:01 AM  Result Value Ref Range   Glucose-Capillary 92 65 - 99 mg/dL  CBC     Status: Abnormal   Collection Time: 07/27/16  1:12 AM  Result Value Ref Range   WBC 9.1 3.6 - 11.0 K/uL   RBC 3.93 3.80 - 5.20 MIL/uL   Hemoglobin 11.5 (L) 12.0 - 16.0 g/dL   HCT 33.7 (L) 35.0 - 47.0 %   MCV 85.6 80.0 - 100.0 fL   MCH 29.3 26.0 - 34.0 pg   MCHC 34.2 32.0 - 36.0 g/dL   RDW 13.4 11.5 - 14.5 %   Platelets 243 150 - 440 K/uL  Comprehensive metabolic panel     Status: Abnormal   Collection Time: 07/27/16  1:12 AM  Result Value Ref Range   Sodium 139 135 - 145 mmol/L   Potassium 3.3 (L) 3.5 - 5.1 mmol/L   Chloride 108 101 - 111 mmol/L   CO2 23 22 - 32 mmol/L   Glucose, Bld 141 (H) 65 - 99 mg/dL   BUN 22 (H) 6 - 20 mg/dL   Creatinine, Ser 1.13 (H) 0.44 - 1.00 mg/dL   Calcium 9.1 8.9 - 10.3 mg/dL   Total Protein 7.2 6.5 - 8.1 g/dL   Albumin 4.1 3.5 - 5.0 g/dL   AST 20 15 - 41 U/L   ALT 18 14 - 54 U/L   Alkaline Phosphatase 66 38 - 126 U/L   Total Bilirubin 0.3 0.3 - 1.2 mg/dL   GFR calc non Af Amer 46 (L) >60 mL/min   GFR calc Af Amer 53 (L) >60 mL/min    Comment: (NOTE) The eGFR has been calculated using the CKD EPI equation. This calculation has not been validated in all clinical situations. eGFR's persistently <60 mL/min signify possible Chronic Kidney Disease.    Anion gap 8 5 - 15  Troponin I     Status: None   Collection Time: 07/27/16  1:12 AM  Result Value Ref Range   Troponin I <0.03 <0.03 ng/mL  Glucose, capillary     Status: None   Collection Time: 07/27/16  2:05 AM  Result Value Ref Range   Glucose-Capillary 98 65 - 99 mg/dL  Glucose, capillary     Status: Abnormal   Collection Time: 07/27/16  3:04 AM  Result Value Ref Range   Glucose-Capillary 137 (H) 65 - 99 mg/dL  Urinalysis complete, with microscopic ([facility] only)     Status: Abnormal   Collection Time: 07/27/16  3:12 AM  Result Value Ref Range   Color, Urine STRAW (A) YELLOW    APPearance CLEAR (A) CLEAR   Glucose,  UA 150 (A) NEGATIVE mg/dL   Bilirubin Urine NEGATIVE NEGATIVE   Ketones, ur NEGATIVE NEGATIVE mg/dL   Specific Gravity, Urine 1.012 1.005 - 1.030   Hgb urine dipstick NEGATIVE NEGATIVE   pH 5.0 5.0 - 8.0   Protein, ur 30 (A) NEGATIVE mg/dL   Nitrite NEGATIVE NEGATIVE   Leukocytes, UA 1+ (A) NEGATIVE   RBC / HPF 0-5 0 - 5 RBC/hpf   WBC, UA 0-5 0 - 5 WBC/hpf   Bacteria, UA RARE (A) NONE SEEN   Squamous Epithelial / LPF 0-5 (A) NONE SEEN  Glucose, capillary     Status: Abnormal   Collection Time: 07/27/16  4:02 AM  Result Value Ref Range   Glucose-Capillary 185 (H) 65 - 99 mg/dL   Ct Head Wo Contrast  Result Date: 07/27/2016 CLINICAL DATA:  Hypoglycemic syncope with head injury. EXAM: CT HEAD WITHOUT CONTRAST TECHNIQUE: Contiguous axial images were obtained from the base of the skull through the vertex without intravenous contrast. COMPARISON:  Head CT 06/30/2009 FINDINGS: Brain: No evidence of acute infarction, hemorrhage, hydrocephalus, extra-axial collection or mass lesion/mass effect. Age related atrophy and chronic small vessel ischemia. Vascular: No hyperdense vessel or unexpected calcification. Atherosclerosis of skullbase vasculature. Skull: Right parietal scalp hematoma without subjacent fracture. No focal lesion. Sinuses/Orbits: No acute finding. IMPRESSION: Right scalp hematoma. No fracture or acute intracranial abnormality. Electronically Signed   By: Jeb Levering M.D.   On: 07/27/2016 02:13    Review of Systems  Constitutional: Negative for chills and fever.  HENT: Negative for sore throat and tinnitus.   Eyes: Negative for blurred vision and redness.  Respiratory: Negative for cough and shortness of breath.   Cardiovascular: Negative for chest pain, palpitations, orthopnea and PND.  Gastrointestinal: Negative for abdominal pain, diarrhea, nausea and vomiting.  Genitourinary: Negative for dysuria, frequency and urgency.   Musculoskeletal: Positive for falls. Negative for joint pain and myalgias.  Skin: Negative for rash.       No lesions  Neurological: Positive for weakness. Negative for speech change and focal weakness.  Endo/Heme/Allergies: Does not bruise/bleed easily.       No temperature intolerance  Psychiatric/Behavioral: Negative for depression and suicidal ideas.    Blood pressure (!) 161/58, pulse 73, temperature 97.7 F (36.5 C), temperature source Oral, resp. rate 17, height 5' 5"  (1.651 m), weight 91.6 kg (202 lb), SpO2 98 %. Physical Exam  Vitals reviewed. Constitutional: She is oriented to person, place, and time. She appears well-developed and well-nourished. No distress.  Bearhugger are in place  HENT:  Head: Normocephalic and atraumatic.  Mouth/Throat: Oropharynx is clear and moist.  Eyes: Conjunctivae and EOM are normal. Pupils are equal, round, and reactive to light. No scleral icterus.  Neck: Normal range of motion. Neck supple. No JVD present. No tracheal deviation present. No thyromegaly present.  Cardiovascular: Normal rate and regular rhythm.  Exam reveals no gallop and no friction rub.   Murmur heard.  Systolic murmur is present with a grade of 3/6  Heard best over aortic valve area  Respiratory: Effort normal and breath sounds normal.  GI: Soft. Bowel sounds are normal. She exhibits no distension. There is no tenderness.  Genitourinary:  Genitourinary Comments: Deferred  Musculoskeletal: Normal range of motion. She exhibits no edema.  Lymphadenopathy:    She has no cervical adenopathy.  Neurological: She is alert and oriented to person, place, and time. No cranial nerve deficit. She exhibits normal muscle tone.  Skin: Skin is warm and dry. No  rash noted. No erythema.  Psychiatric: She has a normal mood and affect. Her behavior is normal. Judgment and thought content normal.     Assessment/Plan This is a 75 year old female admitted for persistent hypoglycemia and  hypothermia. 1. Hypoglycemia: The patient has mixed up her long and short acting insulin in the past. I suspect this is what occurred tonight although she reports to EMS and emergency department staff that she did not confuse her insulin. However, the patient is also unclear about certain aspects of her past medical history including her heart murmur consistent with aortic stenosis. Her family members also not in agreement to the likelihood of switched insulin. Continue IV hydration with supplemental dextrose. I have held all the patient's diabetic medications and she will be allowed to eat a regular diet until her capillary glucose is stable. 2. Hypothermia: The patient does not have significant environmental exposure that would cause such low body temperature. Her laboratory workup and remainder of her vital signs is reassuring. I do not think the patient has sepsis. Hypothermia is an expected consequence of hypoglycemia. We'll continue to warm the patient and monitor for further signs or symptoms of sepsis. Blood pressure is stable. 3. Diabetes mellitus type 2: Hold insulin until blood sugars are stabilized. Check fingerstick blood sugar every 6 hours. 4. Hypothyroidism: Check TSH. Continue Synthroid 5. Hypertension: Controlled; continue carvedilol, losartan and amlodipine 6. Hyperlipidemia: Continue statin therapy 7. Depression: Continue amitriptyline 8. DVT prophylaxis: Lovenox 9. GI prophylaxis: Pantoprazole per home regimen The patient is a full code. Time spent on admission orders and patient care approximately 45 minutes  Harrie Foreman, MD 07/27/2016, 6:17 AM

## 2016-07-27 NOTE — Care Management Obs Status (Signed)
[facility] NOTIFICATION   Patient Details  Name: DAE URIOSTE MRN: [id number] Date of Birth: 09-30-1941   Medicare Observation Status Notification Given:  Yes    Alvie Heidelberg, RN 07/27/2016, 2:19 PM

## 2016-07-27 NOTE — ED Provider Notes (Signed)
[facility] Emergency Department Provider Note    First MD Initiated Contact with Patient 07/27/16 0109     (approximate)  I have reviewed the triage vital signs and the nursing notes.   HISTORY  Chief Complaint Hypoglycemia    HPI Stacey Pierce is a 75 y.o. female with history of aortic stenosis diabetes mellitus presents via EMS with history of hypoglycemia. Per EMS on their arrival the patient was found face down on the kitchen floor by her spouse. Blood glucose level on their arrival 28 as such 1 amp of D50 was given with resultant glucose 258. EMS states that there continued to the ED was 15 minutes on their arrival to the emergency department patient's glucose 92. EMS states that the patient's husband gave the patient orange juice however she vomited following given him doing so.   Past Medical History:  Diagnosis Date  . Airway hyperreactivity 08/26/2015  . Aortic stenosis   . Diabetes mellitus ([place])    Takes metformin  . DR (diabetic retinopathy) ([place])   . Environmental allergies   . GERD (gastroesophageal reflux disease)   . Glaucoma   . Hyperlipidemia   . Hypertension   . Hypothyroidism   . Long-term insulin use ([place])   . Microalbuminuria   . Obesity   . Renal insufficiency    diabetic related, "starting to fail"-per patient.(6.30.16)    Patient Active Problem List   Diagnosis Date Noted  . Hypoglycemia 07/27/2016  . Allergic rhinitis 08/26/2015  . Airway hyperreactivity 08/26/2015  . Back pain, chronic 08/26/2015  . Diabetes ([place]) 08/26/2015  . Degeneration of lumbar or lumbosacral intervertebral disc 08/26/2015  . Acid reflux 08/26/2015  . Glaucoma 08/26/2015  . Cardiac murmur 08/26/2015  . HLD (hyperlipidemia) 08/26/2015  . Adiposity 08/26/2015  . Severe aortic stenosis 05/10/2015  . Benign essential HTN 04/29/2015  . Arteriosclerosis of coronary artery 01/26/2015  . Retinopathy 11/19/2014  . Chronic kidney disease  (CKD), stage III (moderate) 11/19/2014  . Adult hypothyroidism 02/17/2014    Past Surgical History:  Procedure Laterality Date  . CATARACT EXTRACTION     left 2010  . CATARACT EXTRACTION W/PHACO Right 10/13/2015   Procedure: CATARACT EXTRACTION PHACO AND INTRAOCULAR LENS PLACEMENT (IOC);  Surgeon: Leandrew Koyanagi, MD;  Location: [facility];  Service: Ophthalmology;  Laterality: Right;  DIABETIC - insulin and oral meds MALYUGIN SHUGARCAINE  . CHOLECYSTECTOMY     1967  . DILATION AND CURETTAGE OF UTERUS    . LAPAROSCOPIC TUBAL LIGATION    . TONSILLECTOMY     1974    Prior to Admission medications   Medication Sig Start Date End Date Taking? Authorizing Provider  amitriptyline (ELAVIL) 10 MG tablet TAKE ONE TABLET BY MOUTH AT BEDTIME 05/30/16  Yes Richard Maceo Pro., MD  amLODipine (NORVASC) 5 MG tablet Take 1 tablet (5 mg total) by mouth daily. 06/30/16  Yes Richard Maceo Pro., MD  aspirin 81 MG tablet Take 81 mg by mouth at bedtime.  09/30/11  Yes Historical Provider, MD  atorvastatin (LIPITOR) 40 MG tablet Take 1 tablet (40 mg total) by mouth daily. 06/12/16  Yes Richard Maceo Pro., MD  carvedilol (COREG) 25 MG tablet Take 1 tablet (25 mg total) by mouth 2 (two) times daily with a meal. 02/17/16  Yes Jerrol Banana., MD  hydrochlorothiazide (HYDRODIURIL) 25 MG tablet Take 25 mg by mouth daily.   Yes Historical Provider, MD  insulin detemir (LEVEMIR) 100 UNIT/ML injection  Inject into the skin 2 (two) times daily. 20 units in the am and 60 units at hs   Yes Historical Provider, MD  insulin lispro (HUMALOG) 100 UNIT/ML injection Inject 25-35 Units into the skin 2 (two) times daily. 35 units qam  25 at supper   Yes Historical Provider, MD  latanoprost (XALATAN) 0.005 % ophthalmic solution 1 drop at bedtime.   Yes Historical Provider, MD  levothyroxine (SYNTHROID, LEVOTHROID) 100 MCG tablet Take 1 tablet (100 mcg total) by mouth daily. 07/13/15  Yes Richard Maceo Pro., MD  losartan (COZAAR) 100 MG tablet TAKE 1 TABLET BY MOUTH EVERY DAY 10/27/15  Yes Jerrol Banana., MD  metFORMIN (GLUCOPHAGE) 1000 MG tablet Take 2,000 mg by mouth daily with supper. TAKING 2000 MG   Yes Historical Provider, MD  Multiple Vitamin (MULTIVITAMIN) tablet Take 1 tablet by mouth daily.   Yes Historical Provider, MD  omeprazole (PRILOSEC OTC) 20 MG tablet Take 20-40 mg by mouth daily.  09/30/11  Yes Historical Provider, MD  timolol (TIMOPTIC) 0.25 % ophthalmic solution Apply to eye. 09/30/11  Yes Historical Provider, MD    Allergies Codeine; Sulfa antibiotics; Azithromycin; Erythromycin; and Penicillins  Family History  Problem Relation Age of Onset  . Alzheimer's disease Mother   . Colon polyps Mother   . Hypertension Mother   . Heart attack Father   . Coronary artery disease Father   . Diabetes Father   . Breast cancer Sister   . Colon polyps Sister     Social History Social History  Substance Use Topics  . Smoking status: Never Smoker  . Smokeless tobacco: Never Used  . Alcohol use No    Review of Systems Constitutional: No fever/chills Eyes: No visual changes. ENT: No sore throat. Cardiovascular: Denies chest pain. Respiratory: Denies shortness of breath. Gastrointestinal: No abdominal pain.  No nausea, no vomiting.  No diarrhea.  No constipation. Genitourinary: Negative for dysuria. Musculoskeletal: Negative for back pain. Skin: Negative for rash. Neurological: Negative for headaches, focal weakness or numbness.  10-point ROS otherwise negative.  ____________________________________________   PHYSICAL EXAM:  VITAL SIGNS: ED Triage Vitals  Enc Vitals Group     BP 07/27/16 0107 (!) 163/62     Pulse Rate 07/27/16 0107 66     Resp 07/27/16 0107 15     Temp --      Temp src --      SpO2 07/27/16 0107 98 %     Weight 07/27/16 0108 202 lb (91.6 kg)     Height 07/27/16 0108 5\' 5"  (1.651 m)     Head Circumference --      Peak Flow --       Pain Score 07/27/16 0108 0     Pain Loc --      Pain Edu? --      Excl. in [place]? --     Constitutional: Somnolent but easily arousable. Eyes: Conjunctivae are normal. PERRL. EOMI. Head: Atraumatic. Mouth/Throat: Mucous membranes are moist.  Oropharynx non-erythematous. Neck: No stridor.  No meningeal signs.  Cardiovascular: Normal rate, regular rhythm. Good peripheral circulation. Systolic ejection murmur  Respiratory: Normal respiratory effort.  No retractions. Lungs CTAB. Gastrointestinal: Soft and nontender. No distention.  Musculoskeletal: No lower extremity tenderness nor edema. No gross deformities of extremities. Neurologic:  Normal speech and language. No gross focal neurologic deficits are appreciated.  Skin:  Skin is warm, dry and intact. No rash noted. Psychiatric: Mood and affect are normal. Speech and behavior  are normal.  ____________________________________________   LABS (all labs ordered are listed, but only abnormal results are displayed)  Labs Reviewed  CBC - Abnormal; Notable for the following:       Result Value   Hemoglobin 11.5 (*)    HCT 33.7 (*)    All other components within normal limits  COMPREHENSIVE METABOLIC PANEL - Abnormal; Notable for the following:    Potassium 3.3 (*)    Glucose, Bld 141 (*)    BUN 22 (*)    Creatinine, Ser 1.13 (*)    GFR calc non Af Amer 46 (*)    GFR calc Af Amer 53 (*)    All other components within normal limits  GLUCOSE, CAPILLARY - Abnormal; Notable for the following:    Glucose-Capillary 137 (*)    All other components within normal limits  URINALYSIS COMPLETEWITH MICROSCOPIC ([facility] ONLY) - Abnormal; Notable for the following:    Color, Urine STRAW (*)    APPearance CLEAR (*)    Glucose, UA 150 (*)    Protein, ur 30 (*)    Leukocytes, UA 1+ (*)    Bacteria, UA RARE (*)    Squamous Epithelial / LPF 0-5 (*)    All other components within normal limits  GLUCOSE, CAPILLARY - Abnormal; Notable for the following:     Glucose-Capillary 185 (*)    All other components within normal limits  CULTURE, BLOOD (ROUTINE X 2)  CULTURE, BLOOD (ROUTINE X 2)  GLUCOSE, CAPILLARY  TROPONIN I  GLUCOSE, CAPILLARY  TSH  HEMOGLOBIN A1C   ____________________________________________  EKG  ED ECG REPORT I, [place] N BROWN, the attending physician, personally viewed and interpreted this ECG.   Date: 07/27/2016  EKG Time: 1:06 AM  Rate: 66  Rhythm: Normal sinus rhythm  Axis: Normal  Intervals: Normal  ST&T Change: None  ____________________________________________  RADIOLOGY I, [place] N BROWN, personally viewed and evaluated these images (plain radiographs) as part of my medical decision making, as well as reviewing the written report by the radiologist.  Ct Head Wo Contrast  Result Date: 07/27/2016 CLINICAL DATA:  Hypoglycemic syncope with head injury. EXAM: CT HEAD WITHOUT CONTRAST TECHNIQUE: Contiguous axial images were obtained from the base of the skull through the vertex without intravenous contrast. COMPARISON:  Head CT 06/30/2009 FINDINGS: Brain: No evidence of acute infarction, hemorrhage, hydrocephalus, extra-axial collection or mass lesion/mass effect. Age related atrophy and chronic small vessel ischemia. Vascular: No hyperdense vessel or unexpected calcification. Atherosclerosis of skullbase vasculature. Skull: Right parietal scalp hematoma without subjacent fracture. No focal lesion. Sinuses/Orbits: No acute finding. IMPRESSION: Right scalp hematoma. No fracture or acute intracranial abnormality. Electronically Signed   By: Jeb Levering M.D.   On: 07/27/2016 02:13     Procedures      INITIAL IMPRESSION / ASSESSMENT AND PLAN / ED COURSE  Pertinent labs & imaging results that were available during my care of the patient were reviewed by me and considered in my medical decision making (see chart for details).  She given a half amp D50 on arrival to the emergency department D10 normal  saline at 75 ML's per hour started however patient remains persistently hypoglycemic as such patient was discussed with Dr. Marcille Blanco for hospital admission for further evaluation and management    Clinical Course    ____________________________________________  FINAL CLINICAL IMPRESSION(S) / ED DIAGNOSES  Final diagnoses:  None  Hypoglycemia  MEDICATIONS GIVEN DURING THIS VISIT:  Medications  dextrose 10 % infusion ( Intravenous Stopped 07/27/16  0114)  dextrose 10 % infusion ( Intravenous Restarted 07/27/16 0512)  latanoprost (XALATAN) 0.005 % ophthalmic solution 1 drop (not administered)  hydrochlorothiazide (HYDRODIURIL) tablet 25 mg (not administered)  levothyroxine (SYNTHROID, LEVOTHROID) tablet 100 mcg (not administered)  timolol (TIMOPTIC) 0.25 % ophthalmic solution 1 drop (not administered)  aspirin chewable tablet 81 mg (not administered)  losartan (COZAAR) tablet 100 mg (not administered)  carvedilol (COREG) tablet 25 mg (not administered)  amitriptyline (ELAVIL) tablet 10 mg (not administered)  atorvastatin (LIPITOR) tablet 40 mg (not administered)  amLODipine (NORVASC) tablet 5 mg (not administered)  multivitamin with minerals tablet 1 tablet (not administered)  enoxaparin (LOVENOX) injection 40 mg (not administered)  sodium chloride flush (NS) 0.9 % injection 3 mL (not administered)  acetaminophen (TYLENOL) tablet 650 mg (not administered)    Or  acetaminophen (TYLENOL) suppository 650 mg (not administered)  traMADol (ULTRAM) tablet 50 mg (not administered)  ondansetron (ZOFRAN) tablet 4 mg (not administered)    Or  ondansetron (ZOFRAN) injection 4 mg (not administered)  pantoprazole (PROTONIX) EC tablet 40 mg (not administered)  dextrose 50 % solution 25 mL (25 mLs Intravenous Given 07/27/16 0102)     NEW OUTPATIENT MEDICATIONS STARTED DURING THIS VISIT:  Current Discharge Medication List        Note:  This document was prepared using Dragon voice  recognition software and may include unintentional dictation errors.    Gregor Hams, MD 07/27/16 0600

## 2016-07-27 NOTE — ED Triage Notes (Signed)
Pt to rm 25 from home, EMS report pt hypoglycemic bg 28, given d50 bg 250, last bg 104.  Pt responsive to stimulation.

## 2016-07-27 NOTE — Progress Notes (Signed)
Inpatient Diabetes Program Recommendations  AACE/ADA: New Consensus Statement on Inpatient Glycemic Control (2015)  Target Ranges:  Prepandial:   less than 140 mg/dL      Peak postprandial:   less than 180 mg/dL (1-2 hours)      Critically ill patients:  140 - 180 mg/dL   Results for Stacey Pierce, Stacey Pierce (MRN [id number]) as of 07/27/2016 09:25  Ref. Range 07/27/2016 01:01 07/27/2016 02:05 07/27/2016 03:04 07/27/2016 04:02 07/27/2016 09:07  Glucose-Capillary Latest Ref Range: 65 - 99 mg/dL 92 98 137 (H) 185 (H) 355 (H)   Review of Glycemic Control  Diabetes history: DM2 Outpatient Diabetes medications: Levemir 20 units QAM, Levemir 60 units QHS, Humalog 35 units QAM, Humalog 25 units QPM, Metformin 2000 mg with supper Current orders for Inpatient glycemic control: CBGs ACHS  Inpatient Diabetes Program Recommendations: IV fluids: Please consider discontinuing dextrose from IV fluids. Insulin - Basal: Patient admitted initially with hypoglycemia which has resolved. Glucose up to 355 mg/dl at 9:07 am. Please consider ordering at least Levemir 18 units BID (based on 91 kg x 0.4 units). Correction (SSI): Please consider ordering CBGs with Novolog correction scale ACHS. Diet: Please change diet from Regular to Carb Modified. Outpatient Referral: Recommend patient follow up with Dr. Gabriel Carina regarding hypoglycemia.   NOTE: Patient is followed by Dr. Gabriel Carina and last seen Dr. Gabriel Carina on 05/05/16 (per Care Everywhere Tab).   Thanks, Barnie Alderman, RN, MSN, CDE Diabetes Coordinator Inpatient Diabetes Program [phone] (Team Pager from [place] to [place]) [phone] ([place] office) [phone] [facility] office) [phone] [facility] office)

## 2016-07-27 NOTE — ED Notes (Signed)
MD notified of BG and temp, per MD order, increased d10 to 125 and bear hugger applied

## 2016-07-27 NOTE — ED Notes (Signed)
Pt taken to CT.

## 2016-07-27 NOTE — Progress Notes (Signed)
Patient alert and oriented. IV fluids infusing. PO fluids tolerated well no complaints. Cardiac monitoring placed. Family at bedside.

## 2016-07-27 NOTE — Care Management Note (Signed)
Case Management Note  Patient Details  Name: Stacey Pierce MRN: [id number] Date of Birth: 1940/12/22  Subjective/Objective:   Spoke with patient who is from home with husband and independent, patient stated that she does not use and equipment at home and is still working. Husband is independent as well.   Both still drive. No difficulty filling Rx.                Action/Plan: No CM needs identified. Home with self care.   Expected Discharge Date:                  Expected Discharge Plan:  Home/Self Care  In-House Referral:     Discharge planning Services  CM Consult  Post Acute Care Choice:    Choice offered to:     DME Arranged:    DME Agency:     HH Arranged:    HH Agency:     Status of Service:     If discussed at [employer] of Stay Meetings, dates discussed:    Additional Comments:  Alvie Heidelberg, RN 07/27/2016, 2:19 PM

## 2016-07-28 DIAGNOSIS — E162 Hypoglycemia, unspecified: Secondary | ICD-10-CM | POA: Diagnosis not present

## 2016-07-28 DIAGNOSIS — T68XXXA Hypothermia, initial encounter: Secondary | ICD-10-CM | POA: Diagnosis not present

## 2016-07-28 DIAGNOSIS — E039 Hypothyroidism, unspecified: Secondary | ICD-10-CM | POA: Diagnosis not present

## 2016-07-28 DIAGNOSIS — W19XXXA Unspecified fall, initial encounter: Secondary | ICD-10-CM | POA: Diagnosis not present

## 2016-07-28 LAB — BASIC METABOLIC PANEL
Anion gap: 7 (ref 5–15)
BUN: 18 mg/dL (ref 6–20)
CALCIUM: 9.5 mg/dL (ref 8.9–10.3)
CO2: 25 mmol/L (ref 22–32)
CREATININE: 1.05 mg/dL — AB (ref 0.44–1.00)
Chloride: 107 mmol/L (ref 101–111)
GFR, EST AFRICAN AMERICAN: 58 mL/min — AB (ref >60)
GFR, EST NON AFRICAN AMERICAN: 50 mL/min — AB (ref >60)
Glucose, Bld: 219 mg/dL — ABNORMAL HIGH (ref 65–99)
Potassium: 4.9 mmol/L (ref 3.5–5.1)
SODIUM: 139 mmol/L (ref 135–145)

## 2016-07-28 LAB — GLUCOSE, CAPILLARY
Glucose-Capillary: 221 mg/dL — ABNORMAL HIGH (ref 65–99)
Glucose-Capillary: 304 mg/dL — ABNORMAL HIGH (ref 65–99)

## 2016-07-28 LAB — MAGNESIUM: Magnesium: 1.6 mg/dL — ABNORMAL LOW (ref 1.7–2.4)

## 2016-07-28 MED ORDER — INSULIN DETEMIR 100 UNIT/ML ~~LOC~~ SOLN: 18.0000 [IU] | Freq: Two times a day (BID) | SUBCUTANEOUS | 11 refills | Status: DC

## 2016-07-28 MED ORDER — INSULIN LISPRO 100 UNIT/ML ~~LOC~~ SOLN: 5.0000 [IU] | Freq: Two times a day (BID) | SUBCUTANEOUS | 11 refills | Status: DC

## 2016-07-28 MED ORDER — LEVOTHYROXINE SODIUM 100 MCG PO TABS: 100.0000 ug | ORAL_TABLET | Freq: Every day | ORAL | 3 refills | Status: DC

## 2016-07-28 NOTE — Progress Notes (Signed)
[place] at [place] NAME: Stacey Pierce    MR#:  [id number]  DATE OF BIRTH:  28-Aug-1941  SUBJECTIVE:  CHIEF COMPLAINT:   Chief Complaint  Patient presents with  . Hypoglycemia      Brought with hypoglycemia and hypothermia.   Short period of D10 drip required, now off, and BS is > 300.   Pt Confirms taking her insulin on time as prescribed and no recent changes.  REVIEW OF SYSTEMS:  CONSTITUTIONAL: No fever, fatigue or weakness.  EYES: No blurred or double vision.  EARS, NOSE, AND THROAT: No tinnitus or ear pain.  RESPIRATORY: No cough, shortness of breath, wheezing or hemoptysis.  CARDIOVASCULAR: No chest pain, orthopnea, edema.  GASTROINTESTINAL: No nausea, vomiting, diarrhea or abdominal pain.  GENITOURINARY: No dysuria, hematuria.  ENDOCRINE: No polyuria, nocturia,  HEMATOLOGY: No anemia, easy bruising or bleeding SKIN: No rash or lesion. MUSCULOSKELETAL: No joint pain or arthritis.   NEUROLOGIC: No tingling, numbness, weakness.  PSYCHIATRY: No anxiety or depression.   ROS  DRUG ALLERGIES:   Allergies  Allergen Reactions  . Codeine Other (See Comments)    colic  . Sulfa Antibiotics Other (See Comments)    colic  . Azithromycin Rash  . Erythromycin Rash  . Penicillins Rash    VITALS:  Blood pressure (!) 135/45, pulse 70, temperature 98.1 F (36.7 C), temperature source Oral, resp. rate 19, height 5\' 5"  (1.651 m), weight 89.6 kg (197 lb 9.8 oz), SpO2 96 %.  PHYSICAL EXAMINATION:  GENERAL:  75 y.o.-year-old patient lying in the bed with no acute distress.  EYES: Pupils equal, round, reactive to light and accommodation. No scleral icterus. Extraocular muscles intact.  HEENT: Head atraumatic, normocephalic. Oropharynx and nasopharynx clear.  NECK:  Supple, no jugular venous distention. No thyroid enlargement, no tenderness.  LUNGS: Normal breath sounds bilaterally, no wheezing, rales,rhonchi or crepitation. No use of  accessory muscles of respiration.  CARDIOVASCULAR: S1, S2 normal. No murmurs, rubs, or gallops.  ABDOMEN: Soft, nontender, nondistended. Bowel sounds present. No organomegaly or mass.  EXTREMITIES: No pedal edema, cyanosis, or clubbing.  NEUROLOGIC: Cranial nerves II through XII are intact. Muscle strength 5/5 in all extremities. Sensation intact. Gait not checked.  PSYCHIATRIC: The patient is alert and oriented x 3.  SKIN: No obvious rash, lesion, or ulcer.   Physical Exam LABORATORY PANEL:   CBC  Recent Labs Lab 07/27/16 0112  WBC 9.1  HGB 11.5*  HCT 33.7*  PLT 243   ------------------------------------------------------------------------------------------------------------------  Chemistries   Recent Labs Lab 07/27/16 0112 07/28/16 0541  NA 139 139  K 3.3* 4.9  CL 108 107  CO2 23 25  GLUCOSE 141* 219*  BUN 22* 18  CREATININE 1.13* 1.05*  CALCIUM 9.1 9.5  MG  --  1.6*  AST 20  --   ALT 18  --   ALKPHOS 66  --   BILITOT 0.3  --    ------------------------------------------------------------------------------------------------------------------  Cardiac Enzymes  Recent Labs Lab 07/27/16 0112  TROPONINI <0.03   ------------------------------------------------------------------------------------------------------------------  RADIOLOGY:  Ct Head Wo Contrast  Result Date: 07/27/2016 CLINICAL DATA:  Hypoglycemic syncope with head injury. EXAM: CT HEAD WITHOUT CONTRAST TECHNIQUE: Contiguous axial images were obtained from the base of the skull through the vertex without intravenous contrast. COMPARISON:  Head CT 06/30/2009 FINDINGS: Brain: No evidence of acute infarction, hemorrhage, hydrocephalus, extra-axial collection or mass lesion/mass effect. Age related atrophy and chronic small vessel ischemia. Vascular: No hyperdense vessel or unexpected calcification.  Atherosclerosis of skullbase vasculature. Skull: Right parietal scalp hematoma without subjacent  fracture. No focal lesion. Sinuses/Orbits: No acute finding. IMPRESSION: Right scalp hematoma. No fracture or acute intracranial abnormality. Electronically Signed   By: Jeb Levering M.D.   On: 07/27/2016 02:13    ASSESSMENT AND PLAN:   Active Problems:   Hypoglycemia  * hypoglycemia   Pt was on very high dose of Levemir and novolog.   As now BS is stable- will resume with lower dose of insulin.    She follows with Dr. Gabriel Carina in office.   I advised to make an appointment next week, so she can adjust the doses.   We will discharge with lower insulin doses after monitoring for 24 hrs for stability.  * Hypothermia   Likely due to hypoglycemia   Also found to have high TSH.    Increased dose of synthroid.   Follow with Dr. Gabriel Carina.  * Hypothyroidism   Increased dose , as above.  * Hypertension    COntrolled, Continued home meds.  * Hyperlipidemia   COntinue statin.  * depression    Cont amitriptyline.      All the records are reviewed and case discussed with Care Management/Social Workerr. Management plans discussed with the patient, family and they are in agreement.  CODE STATUS: Full  TOTAL TIME TAKING CARE OF THIS PATIENT: 35 minutes.   Pt's husband was present in room during my visit.  POSSIBLE D/C IN 1-2 DAYS, DEPENDING ON CLINICAL CONDITION.   Vaughan Basta M.D on 07/28/2016   Between 7am to 6pm - Pager - [phone]  After 6pm go to [email or web address] - password [email or web address] [place] [facility]  Office  [phone]  CC: Primary care physician; Wilhemena Durie, MD  Note: This dictation was prepared with Dragon dictation along with smaller phrase technology. Any transcriptional errors that result from this process are unintentional.

## 2016-07-28 NOTE — Progress Notes (Signed)
Inpatient Diabetes Program Recommendations  AACE/ADA: New Consensus Statement on Inpatient Glycemic Control (2015)  Target Ranges:  Prepandial:   less than 140 mg/dL      Peak postprandial:   less than 180 mg/dL (1-2 hours)      Critically ill patients:  140 - 180 mg/dL   Results for ANQUANETTE, BIONDO (MRN [id number]) as of 07/28/2016 12:41  Ref. Range 07/27/2016 04:02 07/27/2016 09:07 07/27/2016 11:27 07/27/2016 14:53 07/27/2016 17:00 07/27/2016 21:11 07/28/2016 07:46 07/28/2016 12:03  Glucose-Capillary Latest Ref Range: 65 - 99 mg/dL 185 (H) 355 (H) 344 (H) 278 (H) 214 (H) 286 (H) 221 (H) 304 (H)   Review of Glycemic Control  Diabetes history: DM2 Outpatient Diabetes medications: Levemir 20 units QAM, Levemir 60 units QHS, Humalog 35 units QAM, Humalog 25 units QPM, Metformin 2000 mg with supper Current orders for Inpatient glycemic control: Levemir 18 units BID, Novolog 0-15 units TID with meals, Novolog 0-5 units QHS  Inpatient Diabetes Program Recommendations: Insulin - Basal: Please consider increasing Levemir to 22 units BID (based on 89 kg x 0.5 units). Insulin - Meal Coverage: Please consider ordering Novolog 5 units TID with meals for meal coverage if patient eats at least 50% of meal. Diet: Please change diet from Regular to Carb Modified.  Thanks, Barnie Alderman, RN, MSN, CDE Diabetes Coordinator Inpatient Diabetes Program [phone] (Team Pager from [place] to [place]) [phone] ([place] office) [phone] [facility] office) [phone] [facility] office)

## 2016-07-28 NOTE — Progress Notes (Signed)
DISCHARGE NOTE:  Pt given discharge instructions and prescriptions. Pt verbalized understanding. Pt wheeled to car.  

## 2016-08-01 DIAGNOSIS — R809 Proteinuria, unspecified: Secondary | ICD-10-CM | POA: Diagnosis not present

## 2016-08-01 DIAGNOSIS — E113293 Type 2 diabetes mellitus with mild nonproliferative diabetic retinopathy without macular edema, bilateral: Secondary | ICD-10-CM | POA: Diagnosis not present

## 2016-08-01 DIAGNOSIS — Z794 Long term (current) use of insulin: Secondary | ICD-10-CM | POA: Diagnosis not present

## 2016-08-01 DIAGNOSIS — E11649 Type 2 diabetes mellitus with hypoglycemia without coma: Secondary | ICD-10-CM | POA: Diagnosis not present

## 2016-08-01 DIAGNOSIS — E1122 Type 2 diabetes mellitus with diabetic chronic kidney disease: Secondary | ICD-10-CM | POA: Diagnosis not present

## 2016-08-01 LAB — CULTURE, BLOOD (ROUTINE X 2)
CULTURE: NO GROWTH
Culture: NO GROWTH

## 2016-08-02 ENCOUNTER — Other Ambulatory Visit: Payer: Self-pay | Admitting: Family Medicine

## 2016-08-09 NOTE — Discharge Summary (Signed)
[place] at [place] NAME: Stacey Pierce    MR#:  [id number]  DATE OF BIRTH:  1941-03-09  DATE OF ADMISSION:  07/27/2016 ADMITTING PHYSICIAN: Harrie Foreman, MD  DATE OF DISCHARGE: 07/28/2016  2:00 PM  PRIMARY CARE PHYSICIAN: Wilhemena Durie, MD    ADMISSION DIAGNOSIS:  diabetic  DISCHARGE DIAGNOSIS:  Active Problems:   Hypoglycemia   SECONDARY DIAGNOSIS:   Past Medical History:  Diagnosis Date  . Airway hyperreactivity 08/26/2015  . Aortic stenosis   . Diabetes mellitus ([place])    Takes metformin  . DR (diabetic retinopathy) ([place])   . Environmental allergies   . GERD (gastroesophageal reflux disease)   . Glaucoma   . Hyperlipidemia   . Hypertension   . Hypothyroidism   . Long-term insulin use ([place])   . Microalbuminuria   . Obesity   . Renal insufficiency    diabetic related, "starting to fail"-per patient.(6.30.16)    HOSPITAL COURSE:   * hypoglycemia   Pt was on very high dose of Levemir and novolog.   As now BS was stable- resume with lower dose of insulin.    She follows with Dr. Gabriel Carina in office.   I advised to make an appointment next week, so she can adjust the doses.   We discharged with lower insulin doses after monitoring for 24 hrs for stability.  * Hypothermia   Likely due to hypoglycemia   Also found to have high TSH.    Increased dose of synthroid.   Follow with Dr. Gabriel Carina.  * Hypothyroidism   Increased dose , as above.  * Hypertension    COntrolled, Continued home meds.  * Hyperlipidemia   COntinue statin.  * depression    Cont amitriptyline.   DISCHARGE CONDITIONS:   Stable.  CONSULTS OBTAINED:    DRUG ALLERGIES:   Allergies  Allergen Reactions  . Codeine Other (See Comments)    colic  . Sulfa Antibiotics Other (See Comments)    colic  . Azithromycin Rash  . Erythromycin Rash  . Penicillins Rash    DISCHARGE MEDICATIONS:   Discharge Medication List as of  07/28/2016 11:45 AM    CONTINUE these medications which have CHANGED   Details  insulin detemir (LEVEMIR) 100 UNIT/ML injection Inject 0.18 mLs (18 Units total) into the skin 2 (two) times daily., Starting Fri 07/28/2016, Normal    insulin lispro (HUMALOG) 100 UNIT/ML injection Inject 0.05 mLs (5 Units total) into the skin 2 (two) times daily. 35 units qam  25 at supper, Starting Fri 07/28/2016, Normal    levothyroxine (SYNTHROID, LEVOTHROID) 100 MCG tablet Take 1 tablet (100 mcg total) by mouth daily. Take early morning EMPTY stomach., Starting Fri 07/28/2016, Normal      CONTINUE these medications which have NOT CHANGED   Details  amitriptyline (ELAVIL) 10 MG tablet TAKE ONE TABLET BY MOUTH AT BEDTIME, Normal    amLODipine (NORVASC) 5 MG tablet Take 1 tablet (5 mg total) by mouth daily., Starting Fri 06/30/2016, Normal    aspirin 81 MG tablet Take 81 mg by mouth at bedtime. , Starting Sat 09/30/2011, Historical Med    atorvastatin (LIPITOR) 40 MG tablet Take 1 tablet (40 mg total) by mouth daily., Starting Mon 06/12/2016, Normal    carvedilol (COREG) 25 MG tablet Take 1 tablet (25 mg total) by mouth 2 (two) times daily with a meal., Starting Thu 02/17/2016, Normal    hydrochlorothiazide (HYDRODIURIL) 25 MG tablet Take 25  mg by mouth daily., Historical Med    latanoprost (XALATAN) 0.005 % ophthalmic solution 1 drop at bedtime., Historical Med    Multiple Vitamin (MULTIVITAMIN) tablet Take 1 tablet by mouth daily., Historical Med    omeprazole (PRILOSEC OTC) 20 MG tablet Take 20-40 mg by mouth daily. , Starting Sat 09/30/2011, Historical Med    timolol (TIMOPTIC) 0.25 % ophthalmic solution Apply to eye., Starting Sat 09/30/2011, Historical Med    losartan (COZAAR) 100 MG tablet TAKE 1 TABLET BY MOUTH EVERY DAY, Normal      STOP taking these medications     metFORMIN (GLUCOPHAGE) 1000 MG tablet          DISCHARGE INSTRUCTIONS:    Follow with Dr. Gabriel Carina in 1-2 weeks.  If you  experience worsening of your admission symptoms, develop shortness of breath, life threatening emergency, suicidal or homicidal thoughts you must seek medical attention immediately by calling 911 or calling your MD immediately  if symptoms less severe.  You Must read complete instructions/literature along with all the possible adverse reactions/side effects for all the Medicines you take and that have been prescribed to you. Take any new Medicines after you have completely understood and accept all the possible adverse reactions/side effects.   Please note  You were cared for by a hospitalist during your hospital stay. If you have any questions about your discharge medications or the care you received while you were in the hospital after you are discharged, you can call the unit and asked to speak with the hospitalist on call if the hospitalist that took care of you is not available. Once you are discharged, your primary care physician will handle any further medical issues. Please note that NO REFILLS for any discharge medications will be authorized once you are discharged, as it is imperative that you return to your primary care physician (or establish a relationship with a primary care physician if you do not have one) for your aftercare needs so that they can reassess your need for medications and monitor your lab values.    Today   CHIEF COMPLAINT:   Chief Complaint  Patient presents with  . Hypoglycemia    HISTORY OF PRESENT ILLNESS:  Stacey Pierce  is a 75 y.o. female with a known history of diabetes and hypothyroidism presents to the emergency department after suffering a fall. She was found to have extremely low blood sugar and given 1 amp of D50 by EMS. She was given another half amp of D50 in the emergency department but continued to have low blood sugar. She was placed on D10 infusion which gradually improve her blood sugar. Notably, the patient was found to have a core temperature of  92.29F as well. Due to persistent hypoglycemia and hypothermia emergency department staff called the hospitalist service for admission   VITAL SIGNS:  Blood pressure (!) 143/48, pulse 60, temperature 97.9 F (36.6 C), temperature source Oral, resp. rate 18, height 5\' 5"  (1.651 m), weight 89.6 kg (197 lb 9.8 oz), SpO2 97 %.  I/O:  No intake or output data in the 24 hours ending 08/09/16 1016  PHYSICAL EXAMINATION:   GENERAL:  75 y.o.-year-old patient lying in the bed with no acute distress.  EYES: Pupils equal, round, reactive to light and accommodation. No scleral icterus. Extraocular muscles intact.  HEENT: Head atraumatic, normocephalic. Oropharynx and nasopharynx clear.  NECK:  Supple, no jugular venous distention. No thyroid enlargement, no tenderness.  LUNGS: Normal breath sounds bilaterally, no wheezing, rales,rhonchi  or crepitation. No use of accessory muscles of respiration.  CARDIOVASCULAR: S1, S2 normal. No murmurs, rubs, or gallops.  ABDOMEN: Soft, nontender, nondistended. Bowel sounds present. No organomegaly or mass.  EXTREMITIES: No pedal edema, cyanosis, or clubbing.  NEUROLOGIC: Cranial nerves II through XII are intact. Muscle strength 5/5 in all extremities. Sensation intact. Gait not checked.  PSYCHIATRIC: The patient is alert and oriented x 3.  SKIN: No obvious rash, lesion, or ulcer.   DATA REVIEW:   CBC No results for input(s): WBC, HGB, HCT, PLT in the last 168 hours.  Chemistries  No results for input(s): NA, K, CL, CO2, GLUCOSE, BUN, CREATININE, CALCIUM, MG, AST, ALT, ALKPHOS, BILITOT in the last 168 hours.  Invalid input(s): GFRCGP  Cardiac Enzymes No results for input(s): TROPONINI in the last 168 hours.  Microbiology Results  Results for orders placed or performed during the hospital encounter of 07/27/16  Blood culture (routine x 2)     Status: None   Collection Time: 07/27/16  2:25 AM  Result Value Ref Range Status   Specimen Description BLOOD  LEFT HAND  Final   Special Requests   Final    BOTTLES DRAWN AEROBIC AND ANAEROBIC 6CC AERO [place] ANA   Culture NO GROWTH 5 DAYS  Final   Report Status 08/01/2016 FINAL  Final  Blood culture (routine x 2)     Status: None   Collection Time: 07/27/16  2:25 AM  Result Value Ref Range Status   Specimen Description BLOOD RIGHT WRIST  Final   Special Requests BOTTLES DRAWN AEROBIC AND ANAEROBIC [place]  Final   Culture NO GROWTH 5 DAYS  Final   Report Status 08/01/2016 FINAL  Final    RADIOLOGY:  No results found.  EKG:   Orders placed or performed during the hospital encounter of 07/27/16  . EKG 12-Lead  . EKG 12-Lead      Management plans discussed with the patient, family and they are in agreement.  CODE STATUS:  Code Status History    Date Active Date Inactive Code Status Order ID Comments User Context   07/27/2016  5:11 AM 07/28/2016  5:13 PM Full Code [id number]  Harrie Foreman, MD Inpatient      TOTAL TIME TAKING CARE OF THIS PATIENT: 35 minutes.    Vaughan Basta M.D on 08/09/2016 at 10:16 AM  Between 7am to 6pm - Pager - [phone]  After 6pm go to [email or web address] - password [email or web address] [place] [facility]  Office  [phone]  CC: Primary care physician; Wilhemena Durie, MD   Note: This dictation was prepared with Dragon dictation along with smaller phrase technology. Any transcriptional errors that result from this process are unintentional.

## 2016-08-24 ENCOUNTER — Encounter: Payer: Medicare Other | Attending: Internal Medicine | Admitting: *Deleted

## 2016-08-24 ENCOUNTER — Encounter: Payer: Self-pay | Admitting: *Deleted

## 2016-08-24 VITALS — BP 108/66 | Ht 65.0 in | Wt 191.9 lb

## 2016-08-24 DIAGNOSIS — Z794 Long term (current) use of insulin: Secondary | ICD-10-CM

## 2016-08-24 DIAGNOSIS — Z713 Dietary counseling and surveillance: Secondary | ICD-10-CM | POA: Diagnosis not present

## 2016-08-24 DIAGNOSIS — E119 Type 2 diabetes mellitus without complications: Secondary | ICD-10-CM | POA: Insufficient documentation

## 2016-08-24 DIAGNOSIS — E1165 Type 2 diabetes mellitus with hyperglycemia: Secondary | ICD-10-CM

## 2016-08-24 NOTE — Progress Notes (Signed)
Diabetes Self-Management Education  Visit Type: First/Initial  Appt. Start Time: 1325 Appt. End Time: 1430  08/24/2016  [name], identified by name and date of birth, is a 75 y.o. female with a diagnosis of Diabetes: Type 2.   ASSESSMENT  Blood pressure 108/66, height 5\' 5"  (1.651 m), weight 191 lb 14.4 oz (87 kg). Body mass index is 31.93 kg/m.      Diabetes Self-Management Education - 08/24/16 1446      Visit Information   Visit Type First/Initial     Initial Visit   Diabetes Type Type 2   Are you currently following a meal plan? No   Are you taking your medications as prescribed? Yes   Date Diagnosed 35 years ago     Health Coping   How would you rate your overall health? Fair     Psychosocial Assessment   Patient Belief/Attitude about Diabetes Other (comment)  "had it so long"   Self-care barriers None   Self-management support Doctor's office   Patient Concerns Nutrition/Meal planning;Glycemic Control;Weight Control;Healthy Lifestyle   Special Needs None   Preferred Learning Style Auditory   Learning Readiness Ready   How often do you need to have someone help you when you read instructions, pamphlets, or other written materials from your doctor or pharmacy? 1 - Never   What is the last grade level you completed in school? GED     Pre-Education Assessment   Patient understands the diabetes disease and treatment process. Needs Review   Patient understands incorporating nutritional management into lifestyle. Needs Instruction   Patient undertands incorporating physical activity into lifestyle. Needs Review   Patient understands using medications safely. Needs Review   Patient understands monitoring blood glucose, interpreting and using results Needs Review   Patient understands prevention, detection, and treatment of acute complications. Needs Review   Patient understands prevention, detection, and treatment of chronic complications. Needs Review   Patient understands how to develop strategies to address psychosocial issues. Needs Instruction   Patient understands how to develop strategies to promote health/change behavior. Needs Instruction     Complications   Last HgB A1C per patient/outside source 7.5 %  07/27/16   How often do you check your blood sugar? 3-4 times/day   Fasting Blood glucose range (mg/dL) >200;180-200  Pt reports FBG's high 100's - 200's mg/dL with reading of 238 mg/dL today.    Postprandial Blood glucose range (mg/dL) --  Pt reports she can't remember some readings before supper but bedtimes are usually 200's mg/dL.    Number of hypoglycemic episodes per month 1  Pt was in the hospital for observation last month after she passed out at home from a low sugar.    Can you tell when your blood sugar is low? Yes   What do you do if your blood sugar is low? drink some orange juice and eat peanut butter   Have you had a dilated eye exam in the past 12 months? Yes   Have you had a dental exam in the past 12 months? Yes   Are you checking your feet? No     Dietary Intake   Breakfast cereal with milk   Lunch peanut butter crackers   Dinner pizza, spaghetti, bacon and eggs   Beverage(s) water, green tea - diet     Exercise   Exercise Type Light (walking / raking leaves)  bowling   How many days per week to you exercise? 1   How many minutes  per day do you exercise? 150   Total minutes per week of exercise 150     Patient Education   Previous Diabetes Education Yes (please comment)  35 years ago   Disease state  Explored patient's options for treatment of their diabetes   Nutrition management  Food label reading, portion sizes and measuring food.;Meal timing in regards to the patients' current diabetes medication.   Physical activity and exercise  Role of exercise on diabetes management, blood pressure control and cardiac health.   Medications Taught/reviewed insulin injection, site rotation, insulin storage and  needle disposal.;Reviewed patients medication for diabetes, action, purpose, timing of dose and side effects.   Monitoring Purpose and frequency of SMBG.;Identified appropriate SMBG and/or A1C goals.   Acute complications Taught treatment of hypoglycemia - the 15 rule.   Chronic complications Relationship between chronic complications and blood glucose control   Psychosocial adjustment Identified and addressed patients feelings and concerns about diabetes     Individualized Goals (developed by patient)   Reducing Risk Improve blood sugars Lose weight Lead a healthier lifestyle     Outcomes   Expected Outcomes Demonstrated interest in learning. Expect positive outcomes   Future DMSE Other (comment)  3 weeks      Individualized Plan for Diabetes Self-Management Training:   Learning Objective:  Patient will have a greater understanding of diabetes self-management. Patient education plan is to attend individual and/or group sessions per assessed needs and concerns.   Plan:   Patient Instructions  Check blood sugars 3 x day before each breakfast, supper and before bed every day Exercise:  Continue bowling  for   2 1/2 hrs weekly  Walk as tolerated Eat 3 meals day, 0-1 snacks a day Space meals 4-6 hours apart Limit foods high in fat Complete 3 Day Food Record and bring to next appt Bring blood sugar records to the next appointment Carry fast acting glucose and a snack at all times Rotate injection sites Return for appointment on:  Thursday September 14, 2016 at 3:00 pm with Pam (dietitian)   Expected Outcomes:  Demonstrated interest in learning. Expect positive outcomes  Education material provided:  General Meal Planning Guidelines Simple Meal Plan 3 Day Food Record Symptoms, causes and treatments of Hypoglycemia  If problems or questions, patient to contact team via:  Johny Drilling, [place], [place], CDE [phone]  Future DSME appointment: (3 weeks) Thursday September 14, 2016  at 3:00 pm with dietitian

## 2016-08-24 NOTE — Patient Instructions (Addendum)
Check blood sugars 3 x day before each breakfast, supper and before bed every day  Exercise:  Continue bowling  for   2 1/2 hrs weekly  Walk as tolerated  Eat 3 meals day, 0-1 snacks a day Space meals 4-6 hours apart Limit foods high in fat Complete 3 Day Food Record and bring to next appt  Bring blood sugar records to the next appointment  Carry fast acting glucose and a snack at all times Rotate injection sites  Return for appointment on:  Thursday September 14, 2016 at 3:00 pm with [facility] (dietitian)

## 2016-08-31 DIAGNOSIS — H401132 Primary open-angle glaucoma, bilateral, moderate stage: Secondary | ICD-10-CM | POA: Diagnosis not present

## 2016-09-01 DIAGNOSIS — N183 Chronic kidney disease, stage 3 (moderate): Secondary | ICD-10-CM | POA: Diagnosis not present

## 2016-09-01 DIAGNOSIS — E1122 Type 2 diabetes mellitus with diabetic chronic kidney disease: Secondary | ICD-10-CM | POA: Diagnosis not present

## 2016-09-01 DIAGNOSIS — Z794 Long term (current) use of insulin: Secondary | ICD-10-CM | POA: Diagnosis not present

## 2016-09-08 DIAGNOSIS — E1122 Type 2 diabetes mellitus with diabetic chronic kidney disease: Secondary | ICD-10-CM | POA: Diagnosis not present

## 2016-09-08 DIAGNOSIS — E1165 Type 2 diabetes mellitus with hyperglycemia: Secondary | ICD-10-CM | POA: Diagnosis not present

## 2016-09-08 DIAGNOSIS — N183 Chronic kidney disease, stage 3 (moderate): Secondary | ICD-10-CM | POA: Diagnosis not present

## 2016-09-08 DIAGNOSIS — E1129 Type 2 diabetes mellitus with other diabetic kidney complication: Secondary | ICD-10-CM | POA: Diagnosis not present

## 2016-09-08 DIAGNOSIS — E11319 Type 2 diabetes mellitus with unspecified diabetic retinopathy without macular edema: Secondary | ICD-10-CM | POA: Diagnosis not present

## 2016-09-14 ENCOUNTER — Encounter: Payer: Medicare Other | Attending: Internal Medicine | Admitting: Dietician

## 2016-09-14 ENCOUNTER — Encounter: Payer: Self-pay | Admitting: Dietician

## 2016-09-14 VITALS — BP 108/64 | Ht 65.5 in | Wt 192.8 lb

## 2016-09-14 DIAGNOSIS — E119 Type 2 diabetes mellitus without complications: Secondary | ICD-10-CM | POA: Diagnosis not present

## 2016-09-14 DIAGNOSIS — E1165 Type 2 diabetes mellitus with hyperglycemia: Secondary | ICD-10-CM

## 2016-09-14 DIAGNOSIS — Z794 Long term (current) use of insulin: Secondary | ICD-10-CM

## 2016-09-14 DIAGNOSIS — Z713 Dietary counseling and surveillance: Secondary | ICD-10-CM | POA: Diagnosis not present

## 2016-09-14 NOTE — Progress Notes (Signed)
Diabetes Self-Management Education  Visit Type:  Follow-up  Appt. Start Time: 1500 Appt. End Time: 1600  09/14/2016  [name], identified by name and date of birth, is a 75 y.o. female with a diagnosis of Diabetes:  .   ASSESSMENT  Blood pressure 108/64, height 5' 5.5" (1.664 m), weight 192 lb 12.8 oz (87.5 kg). Body mass index is 31.6 kg/m.       Diabetes Self-Management Education - 56/25/63 8937      Complications   How often do you check your blood sugar? 1-2 times/day  2 to occasionally 3 times daily   Fasting Blood glucose range (mg/dL) 70-129;130-179;180-200;>200   Postprandial Blood glucose range (mg/dL) >200;180-200;130-179;70-129   Number of hypoglycemic episodes per month --  none since 08/24/16   Can you tell when your blood sugar is low? Yes   What do you do if your blood sugar is low? orange juice   Have you had a dilated eye exam in the past 12 months? Yes   Have you had a dental exam in the past 12 months? Yes   Are you checking your feet? No  MD checks at appointments; patient denies any problems     Dietary Intake   Breakfast 3 meals and 1-2 snacks daily   Lunch peanut butter crackers during workdays, not hungry     Exercise   Exercise Type Light (walking / raking leaves)  bowling   How many days per week to you exercise? 1   How many minutes per day do you exercise? 150   Total minutes per week of exercise 150     Patient Education   Nutrition management  Role of diet in the treatment of diabetes and the relationship between the three main macronutrients and blood glucose level;Food label reading, portion sizes and measuring food.;Meal timing in regards to the patients' current diabetes medication.;Meal options for control of blood glucose level and chronic complications.;Other (comment)  imp of consistent CHO intake + protein with each meal   Physical activity and exercise  Role of exercise on diabetes management, blood pressure control and  cardiac health.   Monitoring Taught/discussed recording of test results and interpretation of SMBG.   Acute complications Taught treatment of hypoglycemia - the 15 rule.     Post-Education Assessment   Patient understands the diabetes disease and treatment process. Demonstrates understanding / competency   Patient understands incorporating nutritional management into lifestyle. Demonstrates understanding / competency   Patient undertands incorporating physical activity into lifestyle. Demonstrates understanding / competency   Patient understands using medications safely. Demonstrates understanding / competency   Patient understands monitoring blood glucose, interpreting and using results Demonstrates understanding / competency   Patient understands prevention, detection, and treatment of acute complications. Demonstrates understanding / competency   Patient understands prevention, detection, and treatment of chronic complications. Demonstrates understanding / competency   Patient understands how to develop strategies to address psychosocial issues. Needs Review   Patient understands how to develop strategies to promote health/change behavior. Needs Review     Outcomes   Program Status Completed      Learning Objective:  Patient will have a greater understanding of diabetes self-management. Patient education plan is to attend individual and/or group sessions per assessed needs and concerns.  Patient's food diary indicates some inconsistent carbohydrate intake. Some meals and snacks are also likely low in protein. Discussed options for adding protein and ensuring consistent carb intake -- for more consistent blood sugars.   Plan:  Patient Instructions   Try adding a small portion of a protein or nutrition drink (low sugar) with your lunch to help keep blood sugar steady.   Include a small amount of protein, such as nuts or lowfat cheese with a fruit for a bedtime snack to prevent low  blood sugars.   Aim for 2-3 servings of carbohydrate foods with each meal.     Expected Outcomes:  Demonstrated interest in learning. Expect positive outcomes  Education material provided: Planning a Balanced Meal; Quick and Healthy Meal Ideas; Smart Snacking; sample of low sugar Boost drink for use as lunch when not hungry or in a hurry.   If problems or questions, patient to contact team via:  Phone  Future DSME appointment: -

## 2016-09-14 NOTE — Patient Instructions (Signed)
   Try adding a small portion of a protein or nutrition drink (low sugar) with your lunch to help keep blood sugar steady.   Include a small amount of protein, such as nuts or lowfat cheese with a fruit for a bedtime snack to prevent low blood sugars.   Aim for 2-3 servings of carbohydrate foods with each meal.

## 2016-09-17 ENCOUNTER — Emergency Department: Payer: Medicare Other

## 2016-09-17 ENCOUNTER — Emergency Department
Admission: EM | Admit: 2016-09-17 | Discharge: 2016-09-17 | Disposition: A | Discharge status: Home / Self Care | Payer: Medicare Other | Attending: Emergency Medicine | Admitting: Emergency Medicine

## 2016-09-17 ENCOUNTER — Other Ambulatory Visit: Payer: Self-pay

## 2016-09-17 ENCOUNTER — Encounter: Payer: Self-pay | Admitting: Emergency Medicine

## 2016-09-17 DIAGNOSIS — E1122 Type 2 diabetes mellitus with diabetic chronic kidney disease: Secondary | ICD-10-CM | POA: Insufficient documentation

## 2016-09-17 DIAGNOSIS — Z7982 Long term (current) use of aspirin: Secondary | ICD-10-CM | POA: Insufficient documentation

## 2016-09-17 DIAGNOSIS — I129 Hypertensive chronic kidney disease with stage 1 through stage 4 chronic kidney disease, or unspecified chronic kidney disease: Secondary | ICD-10-CM | POA: Insufficient documentation

## 2016-09-17 DIAGNOSIS — E039 Hypothyroidism, unspecified: Secondary | ICD-10-CM | POA: Insufficient documentation

## 2016-09-17 DIAGNOSIS — Z79899 Other long term (current) drug therapy: Secondary | ICD-10-CM | POA: Insufficient documentation

## 2016-09-17 DIAGNOSIS — Z794 Long term (current) use of insulin: Secondary | ICD-10-CM | POA: Insufficient documentation

## 2016-09-17 DIAGNOSIS — M79602 Pain in left arm: Secondary | ICD-10-CM

## 2016-09-17 DIAGNOSIS — R079 Chest pain, unspecified: Secondary | ICD-10-CM | POA: Diagnosis not present

## 2016-09-17 DIAGNOSIS — I2510 Atherosclerotic heart disease of native coronary artery without angina pectoris: Secondary | ICD-10-CM | POA: Insufficient documentation

## 2016-09-17 DIAGNOSIS — N183 Chronic kidney disease, stage 3 (moderate): Secondary | ICD-10-CM | POA: Diagnosis not present

## 2016-09-17 LAB — CBC
HEMATOCRIT: 34.7 % — AB (ref 35.0–47.0)
HEMOGLOBIN: 11.7 g/dL — AB (ref 12.0–16.0)
MCH: 29.2 pg (ref 26.0–34.0)
MCHC: 33.7 g/dL (ref 32.0–36.0)
MCV: 86.6 fL (ref 80.0–100.0)
PLATELETS: 258 10*3/uL (ref 150–440)
RBC: 4 MIL/uL (ref 3.80–5.20)
RDW: 13.1 % (ref 11.5–14.5)
WBC: 8.2 10*3/uL (ref 3.6–11.0)

## 2016-09-17 LAB — BASIC METABOLIC PANEL
ANION GAP: 6 (ref 5–15)
BUN: 24 mg/dL — ABNORMAL HIGH (ref 6–20)
CHLORIDE: 107 mmol/L (ref 101–111)
CO2: 24 mmol/L (ref 22–32)
CREATININE: 1.24 mg/dL — AB (ref 0.44–1.00)
Calcium: 9.6 mg/dL (ref 8.9–10.3)
GFR calc non Af Amer: 41 mL/min — ABNORMAL LOW (ref >60)
GFR, EST AFRICAN AMERICAN: 48 mL/min — AB (ref >60)
Glucose, Bld: 216 mg/dL — ABNORMAL HIGH (ref 65–99)
POTASSIUM: 4.2 mmol/L (ref 3.5–5.1)
SODIUM: 137 mmol/L (ref 135–145)

## 2016-09-17 LAB — TROPONIN I: Troponin I: <0.03 ng/mL (ref <0.03)

## 2016-09-17 NOTE — ED Provider Notes (Signed)
[facility] Emergency Department Provider Note   ____________________________________________    I have reviewed the triage vital signs and the nursing notes.   HISTORY  Chief Complaint Arm Pain and Chest Pain     HPI Stacey Pierce is a 75 y.o. female who presents with complaints of left arm pain.Patient reports intermittently over the last several weeks she has had pain in her left lateral arm/tricep area. She has not been concerned about it but today her sons found out about it and became concerned given her history of aortic stenosis. She states that she has had a fluttering in her chest "occasionally "over the last few months. She feels well today and has no chest pain. No shortness of breath. She is followed closely by cardiology. no injury to her arm   Past Medical History:  Diagnosis Date  . Airway hyperreactivity 08/26/2015  . Aortic stenosis   . Diabetes mellitus ([place])    Takes metformin  . DR (diabetic retinopathy) ([place])   . Environmental allergies   . GERD (gastroesophageal reflux disease)   . Glaucoma   . Hyperlipidemia   . Hypertension   . Hypothyroidism   . Long-term insulin use ([place])   . Microalbuminuria   . Obesity   . Renal insufficiency    diabetic related, "starting to fail"-per patient.(6.30.16)    Patient Active Problem List   Diagnosis Date Noted  . Hypoglycemia 07/27/2016  . Allergic rhinitis 08/26/2015  . Airway hyperreactivity 08/26/2015  . Back pain, chronic 08/26/2015  . Diabetes ([place]) 08/26/2015  . Degeneration of lumbar or lumbosacral intervertebral disc 08/26/2015  . Acid reflux 08/26/2015  . Glaucoma 08/26/2015  . Cardiac murmur 08/26/2015  . HLD (hyperlipidemia) 08/26/2015  . Adiposity 08/26/2015  . Severe aortic stenosis 05/10/2015  . Benign essential HTN 04/29/2015  . Arteriosclerosis of coronary artery 01/26/2015  . Retinopathy 11/19/2014  . Chronic kidney disease (CKD), stage III (moderate)  11/19/2014  . Adult hypothyroidism 02/17/2014    Past Surgical History:  Procedure Laterality Date  . CATARACT EXTRACTION     left 2010  . CATARACT EXTRACTION W/PHACO Right 10/13/2015   Procedure: CATARACT EXTRACTION PHACO AND INTRAOCULAR LENS PLACEMENT (IOC);  Surgeon: Leandrew Koyanagi, MD;  Location: [facility];  Service: Ophthalmology;  Laterality: Right;  DIABETIC - insulin and oral meds MALYUGIN SHUGARCAINE  . CHOLECYSTECTOMY     1967  . DILATION AND CURETTAGE OF UTERUS    . LAPAROSCOPIC TUBAL LIGATION    . TONSILLECTOMY     1974    Prior to Admission medications   Medication Sig Start Date End Date Taking? Authorizing Provider  amitriptyline (ELAVIL) 10 MG tablet TAKE ONE TABLET BY MOUTH AT BEDTIME 05/30/16   Richard Maceo Pro., MD  amLODipine (NORVASC) 5 MG tablet Take 1 tablet (5 mg total) by mouth daily. 06/30/16   Jerrol Banana., MD  aspirin 81 MG tablet Take 81 mg by mouth at bedtime.  09/30/11   Historical Provider, MD  atorvastatin (LIPITOR) 40 MG tablet Take 1 tablet (40 mg total) by mouth daily. 06/12/16   Richard Maceo Pro., MD  carvedilol (COREG) 25 MG tablet Take 1 tablet (25 mg total) by mouth 2 (two) times daily with a meal. 02/17/16   Jerrol Banana., MD  diphenhydrAMINE (BENADRYL) 25 mg capsule Take 25 mg by mouth daily.    Historical Provider, MD  dorzolamide-timolol (COSOPT) 22.3-6.8 MG/ML ophthalmic solution Place 1 drop into both eyes  2 (two) times daily. 07/10/16   Historical Provider, MD  hydrochlorothiazide (HYDRODIURIL) 25 MG tablet Take 25 mg by mouth daily.    Historical Provider, MD  insulin detemir (LEVEMIR) 100 UNIT/ML injection Inject 0.18 mLs (18 Units total) into the skin 2 (two) times daily. Patient taking differently: Inject into the skin 2 (two) times daily. 20 units in the morning 50 units in the evening 07/28/16   Vaughan Basta, MD  insulin lispro (HUMALOG) 100 UNIT/ML injection Inject 0.05 mLs (5 Units total)  into the skin 2 (two) times daily. 35 units qam  25 at supper Patient taking differently: Inject into the skin 2 (two) times daily. 30 units before breakfast 20 unts before supper 07/28/16   Vaughan Basta, MD  latanoprost (XALATAN) 0.005 % ophthalmic solution Place 1 drop into both eyes at bedtime.     Historical Provider, MD  levothyroxine (SYNTHROID, LEVOTHROID) 100 MCG tablet Take 1 tablet (100 mcg total) by mouth daily. Take early morning EMPTY stomach. 07/28/16   Vaughan Basta, MD  losartan (COZAAR) 100 MG tablet TAKE 1 TABLET BY MOUTH EVERY DAY 08/02/16   Jerrol Banana., MD  metFORMIN (GLUCOPHAGE) 1000 MG tablet Take 2,000 mg by mouth daily after supper. 12/31/15   Historical Provider, MD  Multiple Vitamin (MULTIVITAMIN) tablet Take 1 tablet by mouth daily.    Historical Provider, MD  omeprazole (PRILOSEC OTC) 20 MG tablet Take 20-40 mg by mouth daily.  09/30/11   Historical Provider, MD     Allergies Codeine; Sulfa antibiotics; Azithromycin; Erythromycin; and Penicillins  Family History  Problem Relation Age of Onset  . Alzheimer's disease Mother   . Colon polyps Mother   . Hypertension Mother   . Heart attack Father   . Coronary artery disease Father   . Diabetes Father   . Breast cancer Sister   . Colon polyps Sister   . Diabetes Sister   . Diabetes Sister   . Diabetes Sister   . Diabetes Sister     Social History Social History  Substance Use Topics  . Smoking status: Never Smoker  . Smokeless tobacco: Never Used  . Alcohol use No    Review of Systems  Constitutional: No fever/chills Eyes: No visual changes.  ENT: No sore throat. CardiovascularAs above Respiratory: Denies shortness of breath. Gastrointestinal: No abdominal pain.   Genitourinary: Negative for dysuria. Musculoskeletal: As above Skin: Negative for rash. Neurological: Negative for headaches or weakness  10-point ROS otherwise  negative.  ____________________________________________   PHYSICAL EXAM:  VITAL SIGNS: ED Triage Vitals  Enc Vitals Group     BP 09/17/16 1943 (!) 190/82     Pulse Rate 09/17/16 1943 66     Resp 09/17/16 1943 18     Temp 09/17/16 1943 97.6 F (36.4 C)     Temp Source 09/17/16 1943 Oral     SpO2 09/17/16 1943 98 %     Weight 09/17/16 2000 192 lb (87.1 kg)     Height 09/17/16 2000 5' 5.5" (1.664 m)     Head Circumference --      Peak Flow --      Pain Score 09/17/16 2000 0     Pain Loc --      Pain Edu? --      Excl. in [place]? --     Constitutional: Alert and oriented. No acute distress. Pleasant and interactive Eyes: Conjunctivae are normal.  Head: Atraumatic. Nose: No congestion/rhinnorhea. Mouth/Throat: Mucous membranes are moist.   Neck:  Painless ROM, No vertebral tenderness to palpation Cardiovascular: Normal rate, regular rhythm. Grossly normal heart sounds.  Good peripheral circulation. Respiratory: Normal respiratory effort.  No retractions. Lungs CTAB. Gastrointestinal: Soft and nontender. No distention.  No CVA tenderness. Genitourinary: deferred Musculoskeletal: Upper extremities Warm and well perfused. Left arm exam is normal, no swelling or erythema or tenderness to palpation. Normal range of motion. 2+ distal pulses. Neurologic:  Normal speech and language. No gross focal neurologic deficits are appreciated.  Skin:  Skin is warm, dry and intact. No rash noted. Psychiatric: Mood and affect are normal. Speech and behavior are normal.  ____________________________________________   LABS (all labs ordered are listed, but only abnormal results are displayed)  Labs Reviewed  BASIC METABOLIC PANEL - Abnormal; Notable for the following:       Result Value   Glucose, Bld 216 (*)    BUN 24 (*)    Creatinine, Ser 1.24 (*)    GFR calc non Af Amer 41 (*)    GFR calc Af Amer 48 (*)    All other components within normal limits  CBC - Abnormal; Notable for the  following:    Hemoglobin 11.7 (*)    HCT 34.7 (*)    All other components within normal limits  TROPONIN I   ____________________________________________  EKG  ED ECG REPORT I, Lavonia Drafts, the attending physician, personally viewed and interpreted this ECG.  Date: 09/17/2016 EKG Time: 7:49 PM Rate: 69 Rhythm: normal sinus rhythm QRS Axis: normal Intervals: normal ST/T Wave abnormalities: normal Conduction Disturbances: none Narrative Interpretation: unremarkable  ____________________________________________  RADIOLOGY  Normal chest x-ray ____________________________________________   PROCEDURES  Procedure(s) performed: No    Critical Care performed: No ____________________________________________   INITIAL IMPRESSION / ASSESSMENT AND PLAN / ED COURSE  Pertinent labs & imaging results that were available during my care of the patient were reviewed by me and considered in my medical decision making (see chart for details).  Patient well-appearing and in no distress. Her exam is benign. EKG is unremarkable, her lab work is reassuring. Feel conservative management is appropriate this time. She will follow-up with her PCP as needed. Return precautions discussed.  Clinical Course   ____________________________________________   FINAL CLINICAL IMPRESSION(S) / ED DIAGNOSES  Final diagnoses:  Left arm pain      NEW MEDICATIONS STARTED DURING THIS VISIT:  Discharge Medication List as of 09/17/2016  9:00 PM       Note:  This document was prepared using Dragon voice recognition software and may include unintentional dictation errors.    Lavonia Drafts, MD 09/17/16 [phone]

## 2016-09-17 NOTE — ED Triage Notes (Signed)
Pt c/o left upper arm pain intermittently for several weeks; denies injury; intermittent sharp pain to arm today; pt adds intermittent "discomfort" to the center of her chest for several weeks as well, says the last time her chest bothered her was about a week ago; pt awake and alert; talking in complete coherent sentences;

## 2016-09-21 ENCOUNTER — Telehealth: Payer: Self-pay | Admitting: Family Medicine

## 2016-09-21 NOTE — Telephone Encounter (Signed)
LM to see if pt. Can come in at 1pm on Nov 30 for AWV before her 2pm CPE scheduled.  Kreg Shropshire

## 2016-09-22 ENCOUNTER — Other Ambulatory Visit: Payer: Self-pay | Admitting: Family Medicine

## 2016-09-25 DIAGNOSIS — L821 Other seborrheic keratosis: Secondary | ICD-10-CM | POA: Diagnosis not present

## 2016-09-25 DIAGNOSIS — L820 Inflamed seborrheic keratosis: Secondary | ICD-10-CM | POA: Diagnosis not present

## 2016-09-25 DIAGNOSIS — L578 Other skin changes due to chronic exposure to nonionizing radiation: Secondary | ICD-10-CM | POA: Diagnosis not present

## 2016-09-26 ENCOUNTER — Other Ambulatory Visit: Payer: Self-pay

## 2016-09-26 MED ORDER — LEVOTHYROXINE SODIUM 100 MCG PO TABS: 100.0000 ug | ORAL_TABLET | Freq: Every day | ORAL | 3 refills | Status: DC

## 2016-10-31 DIAGNOSIS — I129 Hypertensive chronic kidney disease with stage 1 through stage 4 chronic kidney disease, or unspecified chronic kidney disease: Secondary | ICD-10-CM | POA: Diagnosis not present

## 2016-10-31 DIAGNOSIS — N183 Chronic kidney disease, stage 3 (moderate): Secondary | ICD-10-CM | POA: Diagnosis not present

## 2016-10-31 DIAGNOSIS — R809 Proteinuria, unspecified: Secondary | ICD-10-CM | POA: Diagnosis not present

## 2016-10-31 DIAGNOSIS — E1122 Type 2 diabetes mellitus with diabetic chronic kidney disease: Secondary | ICD-10-CM | POA: Diagnosis not present

## 2016-11-02 ENCOUNTER — Ambulatory Visit (INDEPENDENT_AMBULATORY_CARE_PROVIDER_SITE_OTHER): Payer: Medicare Other | Admitting: Family Medicine

## 2016-11-02 VITALS — BP 120/50 | HR 68 | Temp 98.0°F | Resp 16 | Ht 64.0 in | Wt 196.0 lb

## 2016-11-02 DIAGNOSIS — Z23 Encounter for immunization: Secondary | ICD-10-CM

## 2016-11-02 DIAGNOSIS — E118 Type 2 diabetes mellitus with unspecified complications: Secondary | ICD-10-CM

## 2016-11-02 DIAGNOSIS — I10 Essential (primary) hypertension: Secondary | ICD-10-CM | POA: Diagnosis not present

## 2016-11-02 DIAGNOSIS — E039 Hypothyroidism, unspecified: Secondary | ICD-10-CM | POA: Diagnosis not present

## 2016-11-02 DIAGNOSIS — E785 Hyperlipidemia, unspecified: Secondary | ICD-10-CM

## 2016-11-02 DIAGNOSIS — M79602 Pain in left arm: Secondary | ICD-10-CM | POA: Diagnosis not present

## 2016-11-02 NOTE — Progress Notes (Signed)
Patient: Stacey Pierce, Female    DOB: May 11, 1941, 75 y.o.   MRN: [id number] Visit Date: 11/02/2016  Today's Provider: Wilhemena Durie, MD   Chief Complaint  Patient presents with  . Annual Exam   Subjective:   Stacey Pierce is a 75 y.o. female who presents today for her Subsequent Annual Wellness Visit. She feels well. She reports exercising none. She reports she is sleeping well. Overall she feels pretty well. She has some chronic left upper arm pain Which seems to be a little bit more uncomfortable with abduction. It is not exertional. See review of systems otherwise. 02/03/2016 Mammogram 09/02/2010 Colonoscopy Pap. [facility]  Immunization History  Administered Date(s) Administered  . Influenza, High Dose Seasonal PF 08/26/2015  . Pneumococcal Conjugate-13 02/25/2015  . Pneumococcal Polysaccharide-23 10/06/2013     Review of Systems  Constitutional: Negative.   HENT: Negative.   Eyes: Positive for visual disturbance.  Respiratory: Negative.   Cardiovascular: Negative.   Gastrointestinal: Negative.   Endocrine: Negative.   Genitourinary: Negative.   Musculoskeletal: Positive for back pain.       She has chronic left arm pain that is not exertional. As in the deltoid/upper triceps region. No known trauma or injury.  Skin: Negative.   Allergic/Immunologic: Positive for environmental allergies.  Neurological: Positive for dizziness.  Hematological: Negative.   Psychiatric/Behavioral: Negative.     Patient Active Problem List   Diagnosis Date Noted  . Hypoglycemia 07/27/2016  . Allergic rhinitis 08/26/2015  . Airway hyperreactivity 08/26/2015  . Back pain, chronic 08/26/2015  . Diabetes ([place]) 08/26/2015  . Degeneration of lumbar or lumbosacral intervertebral disc 08/26/2015  . Acid reflux 08/26/2015  . Glaucoma 08/26/2015  . Cardiac murmur 08/26/2015  . HLD (hyperlipidemia) 08/26/2015  . Adiposity 08/26/2015  . Severe aortic stenosis 05/10/2015  . Benign  essential HTN 04/29/2015  . Arteriosclerosis of coronary artery 01/26/2015  . Retinopathy 11/19/2014  . Chronic kidney disease (CKD), stage III (moderate) 11/19/2014  . Adult hypothyroidism 02/17/2014    Social History   Social History  . Marital status: Married    Spouse name: N/A  . Number of children: N/A  . Years of education: N/A   Occupational History  . Not on file.   Social History Main Topics  . Smoking status: Never Smoker  . Smokeless tobacco: Never Used  . Alcohol use No  . Drug use: No  . Sexual activity: Not on file   Other Topics Concern  . Not on file   Social History Narrative  . No narrative on file    Past Surgical History:  Procedure Laterality Date  . CATARACT EXTRACTION     left 2010  . CATARACT EXTRACTION W/PHACO Right 10/13/2015   Procedure: CATARACT EXTRACTION PHACO AND INTRAOCULAR LENS PLACEMENT (IOC);  Surgeon: Leandrew Koyanagi, MD;  Location: [facility];  Service: Ophthalmology;  Laterality: Right;  DIABETIC - insulin and oral meds MALYUGIN SHUGARCAINE  . CHOLECYSTECTOMY     1967  . DILATION AND CURETTAGE OF UTERUS    . LAPAROSCOPIC TUBAL LIGATION    . TONSILLECTOMY     1974    Her family history includes Alzheimer's disease in her mother; Breast cancer in her sister; Colon polyps in her mother and sister; Coronary artery disease in her father; Diabetes in her father, sister, sister, sister, and sister; Heart attack in her father; Hypertension in her mother.     Outpatient Encounter Prescriptions as of 11/02/2016  Medication Sig Note  . amitriptyline (  ELAVIL) 10 MG tablet TAKE ONE TABLET BY MOUTH AT BEDTIME   . amLODipine (NORVASC) 5 MG tablet Take 1 tablet (5 mg total) by mouth daily.   Marland Kitchen aspirin 81 MG tablet Take 81 mg by mouth at bedtime.    Marland Kitchen atorvastatin (LIPITOR) 40 MG tablet Take 1 tablet (40 mg total) by mouth daily.   . carvedilol (COREG) 25 MG tablet Take 1 tablet (25 mg total) by mouth 2 (two) times daily with a  meal.   . diphenhydrAMINE (BENADRYL) 25 mg capsule Take 25 mg by mouth daily. 08/24/2016: Received from: [place]: Take 25 mg by mouth every 6 (six) hours as needed for Itching.  . dorzolamide-timolol (COSOPT) 22.3-6.8 MG/ML ophthalmic solution Place 1 drop into both eyes 2 (two) times daily. 08/24/2016: Received from: External Pharmacy Received Sig: INSTILL ONE DROP TWICE A DAY TO BOTH EYES  . hydrochlorothiazide (HYDRODIURIL) 25 MG tablet Take 25 mg by mouth daily.   . insulin detemir (LEVEMIR) 100 UNIT/ML injection Inject 0.18 mLs (18 Units total) into the skin 2 (two) times daily. (Patient taking differently: Inject into the skin 2 (two) times daily. 20 units in the morning 50 units in the evening)   . insulin lispro (HUMALOG) 100 UNIT/ML injection Inject 0.05 mLs (5 Units total) into the skin 2 (two) times daily. 35 units qam  25 at supper (Patient taking differently: Inject into the skin 2 (two) times daily. 30 units before breakfast 20 unts before supper)   . latanoprost (XALATAN) 0.005 % ophthalmic solution Place 1 drop into both eyes at bedtime.    Marland Kitchen levothyroxine (SYNTHROID, LEVOTHROID) 100 MCG tablet Take 1 tablet (100 mcg total) by mouth daily. Take early morning EMPTY stomach.   . losartan (COZAAR) 100 MG tablet TAKE 1 TABLET BY MOUTH EVERY DAY   . metFORMIN (GLUCOPHAGE) 1000 MG tablet Take 2,000 mg by mouth daily after supper. 08/24/2016: Received from: [place]: Take 2 tablets (2,000 mg total) by mouth daily with dinner.  . Multiple Vitamin (MULTIVITAMIN) tablet Take 1 tablet by mouth daily.   Marland Kitchen omeprazole (PRILOSEC OTC) 20 MG tablet Take 20-40 mg by mouth daily.     No facility-administered encounter medications on file as of 11/02/2016.     Allergies  Allergen Reactions  . Codeine Other (See Comments)    colic  . Sulfa Antibiotics Other (See Comments)    colic  . Azithromycin Rash  . Erythromycin Rash  .  Penicillins Rash    Patient Care Team: Jerrol Banana., MD as PCP - General (Family Medicine) Corey Skains, MD as Consulting Physician (Internal Medicine) Rexene Alberts, MD as Consulting Physician (Cardiothoracic Surgery)   Objective:   Vitals: There were no vitals filed for this visit.  Physical Exam  Constitutional: She is oriented to person, place, and time. She appears well-developed and well-nourished.  HENT:  Head: Normocephalic and atraumatic.  Right Ear: External ear normal.  Left Ear: External ear normal.  Nose: Nose normal.  Mouth/Throat: Oropharynx is clear and moist.  Eyes: Conjunctivae are normal. Pupils are equal, round, and reactive to light.  Neck: Normal range of motion. Neck supple.  Cardiovascular: Normal rate, regular rhythm and intact distal pulses.   Murmur heard. Harsh 3/6 murmur at the right upper sternal border  Pulmonary/Chest: Effort normal and breath sounds normal.  Abdominal: Soft. Bowel sounds are normal.  Musculoskeletal: Normal range of motion.  He appears to have full  range of motion left shoulder  Neurological: She is alert and oriented to person, place, and time.  Skin: Skin is warm and dry.  Psychiatric: She has a normal mood and affect. Her behavior is normal. Judgment and thought content normal.    Activities of Daily Living In your present state of health, do you have any difficulty performing the following activities: 11/02/2016  Hearing? N  Vision? Y  Difficulty concentrating or making decisions? N  Walking or climbing stairs? Y  Dressing or bathing? N  Doing errands, shopping? N  Some recent data might be hidden    Fall Risk Assessment Fall Risk  11/02/2016 09/14/2016 08/24/2016 08/26/2015  Falls in the past year? Yes (No Data) Yes No  Number falls in past yr: 1 - 1 -  Injury with Fall? No - Yes -  Risk Factor Category  - - High Fall Risk -  Risk for fall due to : - - Medication side effect -  Follow up - -  Education provided;Falls prevention discussed -     Depression Screen PHQ 2/9 Scores 11/02/2016 08/24/2016 08/26/2015  PHQ - 2 Score 0 0 0   Current Exercise Habits: Home exercise routine, Type of exercise: Other - see comments (bowling), Frequency (Times/Week): 1    Cognitive Testing - 6-CIT    Year: 0 4 points  Month: 0 3 points  Memorize "Pia Mau, [address] [place]"  Time (within 1 hour:) 0 3 points  Count backwards from 20: 0 2 4 points  Name months of year: 0 2 4 points  Repeat Address: 0 2 4 6 8 10  points   Total Score: 0/28  Interpretation : Normal (0-7) Abnormal (8-28)    Assessment & Plan:     Annual Wellness Visit  Reviewed patient's Family Medical History Reviewed and updated list of patient's medical providers Assessment of cognitive impairment was done Assessed patient's functional ability Established a written schedule for health screening [place] Completed and Reviewed  Exercise Activities and Dietary recommendations Goals    None      Immunization History  Administered Date(s) Administered  . Influenza, High Dose Seasonal PF 08/26/2015  . Pneumococcal Conjugate-13 02/25/2015  . Pneumococcal Polysaccharide-23 10/06/2013    Health Maintenance  Topic Date Due  . FOOT EXAM  12/11/1949  . OPHTHALMOLOGY EXAM  12/11/1949  . TETANUS/TDAP  12/11/1958  . ZOSTAVAX  12/12/1999  . DEXA SCAN  12/11/2004  . INFLUENZA VACCINE  07/04/2016  . HEMOGLOBIN A1C  01/27/2017  . PNA vac Low Risk Adult  Completed    Appropriate diet and exercise discussed. Gynecologic care per [facility] side Discussed health benefits of physical activity, and encouraged her to engage in regular exercise appropriate for her age and condition.   Type 2 diabetes Per endocrinology. Hypertension Obesity Significant aortic stenosis Hypothyroidism Hyperlipidemia Check labs. Diabetic nephropathy Left upper arm pain I think this is muscular of  musculoskeletal. I do not think this is angina. May need orthopedic referral. Doubt cervical radiculopathy. Use Tylenol, avoid nonsteroidals.  I have done the exam and reviewed the chart and it is accurate to the best of my knowledge. Development worker, community has been used and  any errors in dictation or transcription are unintentional. Miguel Aschoff M.D. [facility]

## 2016-11-07 DIAGNOSIS — E039 Hypothyroidism, unspecified: Secondary | ICD-10-CM | POA: Diagnosis not present

## 2016-11-07 DIAGNOSIS — E118 Type 2 diabetes mellitus with unspecified complications: Secondary | ICD-10-CM | POA: Diagnosis not present

## 2016-11-07 DIAGNOSIS — E785 Hyperlipidemia, unspecified: Secondary | ICD-10-CM | POA: Diagnosis not present

## 2016-11-07 DIAGNOSIS — I10 Essential (primary) hypertension: Secondary | ICD-10-CM | POA: Diagnosis not present

## 2016-11-08 ENCOUNTER — Telehealth: Payer: Self-pay

## 2016-11-08 LAB — LIPID PANEL WITH LDL/HDL RATIO
Cholesterol, Total: 152 mg/dL (ref 100–199)
HDL: 43 mg/dL (ref >39)
LDL Calculated: 69 mg/dL (ref 0–99)
LDL/HDL RATIO: 1.6 ratio (ref 0.0–3.2)
Triglycerides: 198 mg/dL — ABNORMAL HIGH (ref 0–149)
VLDL Cholesterol Cal: 40 mg/dL (ref 5–40)

## 2016-11-08 LAB — COMPREHENSIVE METABOLIC PANEL
ALBUMIN: 4.1 g/dL (ref 3.5–4.8)
ALK PHOS: 78 IU/L (ref 39–117)
ALT: 15 IU/L (ref 0–32)
AST: 12 IU/L (ref 0–40)
Albumin/Globulin Ratio: 1.7 (ref 1.2–2.2)
BUN / CREAT RATIO: 19 (ref 12–28)
BUN: 23 mg/dL (ref 8–27)
CHLORIDE: 102 mmol/L (ref 96–106)
CO2: 22 mmol/L (ref 18–29)
Calcium: 9.3 mg/dL (ref 8.7–10.3)
Creatinine, Ser: 1.22 mg/dL — ABNORMAL HIGH (ref 0.57–1.00)
GFR calc Af Amer: 50 mL/min/{1.73_m2} — ABNORMAL LOW (ref >59)
GFR calc non Af Amer: 43 mL/min/{1.73_m2} — ABNORMAL LOW (ref >59)
GLOBULIN, TOTAL: 2.4 g/dL (ref 1.5–4.5)
Glucose: 206 mg/dL — ABNORMAL HIGH (ref 65–99)
POTASSIUM: 4.7 mmol/L (ref 3.5–5.2)
SODIUM: 137 mmol/L (ref 134–144)
Total Protein: 6.5 g/dL (ref 6.0–8.5)

## 2016-11-08 LAB — TSH: TSH: 2.83 u[IU]/mL (ref 0.450–4.500)

## 2016-11-08 LAB — HEMOGLOBIN A1C
Est. average glucose Bld gHb Est-mCnc: 169 mg/dL
HEMOGLOBIN A1C: 7.5 % — AB (ref 4.8–5.6)

## 2016-11-08 NOTE — Telephone Encounter (Signed)
-----   Message from Jerrol Banana., MD sent at 11/08/2016  7:37 AM EST ----- Labs stable.

## 2016-11-08 NOTE — Telephone Encounter (Signed)
Patient advised.

## 2016-12-11 ENCOUNTER — Other Ambulatory Visit: Payer: Self-pay | Admitting: Family Medicine

## 2016-12-14 DIAGNOSIS — I350 Nonrheumatic aortic (valve) stenosis: Secondary | ICD-10-CM | POA: Diagnosis not present

## 2016-12-14 DIAGNOSIS — I10 Essential (primary) hypertension: Secondary | ICD-10-CM | POA: Diagnosis not present

## 2016-12-14 DIAGNOSIS — M79602 Pain in left arm: Secondary | ICD-10-CM | POA: Diagnosis not present

## 2016-12-14 DIAGNOSIS — I2510 Atherosclerotic heart disease of native coronary artery without angina pectoris: Secondary | ICD-10-CM | POA: Diagnosis not present

## 2017-01-08 DIAGNOSIS — N183 Chronic kidney disease, stage 3 (moderate): Secondary | ICD-10-CM | POA: Diagnosis not present

## 2017-01-08 DIAGNOSIS — Z794 Long term (current) use of insulin: Secondary | ICD-10-CM | POA: Diagnosis not present

## 2017-01-08 DIAGNOSIS — E1122 Type 2 diabetes mellitus with diabetic chronic kidney disease: Secondary | ICD-10-CM | POA: Diagnosis not present

## 2017-01-17 DIAGNOSIS — Z794 Long term (current) use of insulin: Secondary | ICD-10-CM | POA: Diagnosis not present

## 2017-01-17 DIAGNOSIS — N183 Chronic kidney disease, stage 3 (moderate): Secondary | ICD-10-CM | POA: Diagnosis not present

## 2017-01-17 DIAGNOSIS — E11319 Type 2 diabetes mellitus with unspecified diabetic retinopathy without macular edema: Secondary | ICD-10-CM | POA: Diagnosis not present

## 2017-01-17 DIAGNOSIS — E1129 Type 2 diabetes mellitus with other diabetic kidney complication: Secondary | ICD-10-CM | POA: Diagnosis not present

## 2017-01-17 DIAGNOSIS — E1122 Type 2 diabetes mellitus with diabetic chronic kidney disease: Secondary | ICD-10-CM | POA: Diagnosis not present

## 2017-02-13 ENCOUNTER — Other Ambulatory Visit: Payer: Self-pay

## 2017-02-13 MED ORDER — ATORVASTATIN CALCIUM 40 MG PO TABS: 40.0000 mg | ORAL_TABLET | Freq: Every day | ORAL | 3 refills | Status: DC

## 2017-02-20 ENCOUNTER — Ambulatory Visit (INDEPENDENT_AMBULATORY_CARE_PROVIDER_SITE_OTHER): Payer: Medicare Other | Admitting: Family Medicine

## 2017-02-20 ENCOUNTER — Ambulatory Visit
Admission: RE | Admit: 2017-02-20 | Discharge: 2017-02-20 | Disposition: A | Discharge status: Home / Self Care | Payer: Medicare Other | Source: Ambulatory Visit | Attending: Family Medicine | Admitting: Family Medicine

## 2017-02-20 ENCOUNTER — Encounter: Payer: Self-pay | Admitting: Family Medicine

## 2017-02-20 VITALS — BP 128/58 | HR 72 | Temp 97.5°F | Resp 16 | Wt 194.0 lb

## 2017-02-20 DIAGNOSIS — I700 Atherosclerosis of aorta: Secondary | ICD-10-CM | POA: Diagnosis not present

## 2017-02-20 DIAGNOSIS — M160 Bilateral primary osteoarthritis of hip: Secondary | ICD-10-CM | POA: Diagnosis not present

## 2017-02-20 DIAGNOSIS — M5136 Other intervertebral disc degeneration, lumbar region: Secondary | ICD-10-CM | POA: Insufficient documentation

## 2017-02-20 DIAGNOSIS — M5431 Sciatica, right side: Secondary | ICD-10-CM

## 2017-02-20 DIAGNOSIS — I739 Peripheral vascular disease, unspecified: Secondary | ICD-10-CM | POA: Insufficient documentation

## 2017-02-20 DIAGNOSIS — M545 Low back pain: Secondary | ICD-10-CM | POA: Diagnosis not present

## 2017-02-20 DIAGNOSIS — M47816 Spondylosis without myelopathy or radiculopathy, lumbar region: Secondary | ICD-10-CM | POA: Diagnosis not present

## 2017-02-20 MED ORDER — METHYLPREDNISOLONE ACETATE 80 MG/ML IJ SUSP
80.0000 mg | Freq: Once | INTRAMUSCULAR | Status: AC
  Administered 2017-02-20: 80 mg via INTRAMUSCULAR

## 2017-02-20 NOTE — Patient Instructions (Addendum)
Heating pad and call back if you would like referral to physical therapy.

## 2017-02-20 NOTE — Progress Notes (Signed)
Subjective:  HPI Pt is here today for right hip pain. She reports that it has been bothering her for some time now but the last 2 days have been a lot worse. Pt reports that it hurts so bad that it takes her breath, and she can not move. She says it is better when she standing and walking around than it is when she is sitting. She denies any known injury to her hip. Denies numbness or tingling. It does not hurt when she lays down on the hip. She has taken tylenol for the pain and seems to help it some.   Prior to Admission medications   Medication Sig Start Date End Date Taking? Authorizing Provider  amitriptyline (ELAVIL) 10 MG tablet TAKE ONE TABLET BY MOUTH AT BEDTIME 05/30/16   Richard Maceo Pro., MD  amLODipine (NORVASC) 5 MG tablet Take 1 tablet (5 mg total) by mouth daily. 06/30/16   Jerrol Banana., MD  aspirin 81 MG tablet Take 81 mg by mouth at bedtime.  09/30/11   Historical Provider, MD  atorvastatin (LIPITOR) 40 MG tablet Take 1 tablet (40 mg total) by mouth daily. 06/12/16   Richard Maceo Pro., MD  atorvastatin (LIPITOR) 40 MG tablet Take 1 tablet (40 mg total) by mouth daily. 02/13/17   Richard Maceo Pro., MD  carvedilol (COREG) 25 MG tablet Take 1 tablet (25 mg total) by mouth 2 (two) times daily with a meal. 02/17/16   Jerrol Banana., MD  diphenhydrAMINE (BENADRYL) 25 mg capsule Take 25 mg by mouth daily.    Historical Provider, MD  dorzolamide-timolol (COSOPT) 22.3-6.8 MG/ML ophthalmic solution Place 1 drop into both eyes 2 (two) times daily. 07/10/16   Historical Provider, MD  hydrochlorothiazide (HYDRODIURIL) 25 MG tablet Take 25 mg by mouth daily.    Historical Provider, MD  insulin aspart (NOVOLOG) 100 UNIT/ML injection Inject into the skin 3 (three) times daily before meals.    Historical Provider, MD  insulin detemir (LEVEMIR) 100 UNIT/ML injection Inject 0.18 mLs (18 Units total) into the skin 2 (two) times daily. Patient taking differently: Inject into  the skin 2 (two) times daily. 20 units in the morning 50 units in the evening 07/28/16   Vaughan Basta, MD  latanoprost (XALATAN) 0.005 % ophthalmic solution Place 1 drop into both eyes at bedtime.     Historical Provider, MD  levothyroxine (SYNTHROID, LEVOTHROID) 100 MCG tablet Take 1 tablet (100 mcg total) by mouth daily. Take early morning EMPTY stomach. 09/26/16   Jerrol Banana., MD  losartan (COZAAR) 100 MG tablet TAKE 1 TABLET BY MOUTH EVERY DAY 08/02/16   Jerrol Banana., MD  metFORMIN (GLUCOPHAGE) 1000 MG tablet Take 2,000 mg by mouth daily after supper. 12/31/15   Historical Provider, MD  Multiple Vitamin (MULTIVITAMIN) tablet Take 1 tablet by mouth daily.    Historical Provider, MD  omeprazole (PRILOSEC OTC) 20 MG tablet Take 20-40 mg by mouth daily.  09/30/11   Historical Provider, MD    Patient Active Problem List   Diagnosis Date Noted  . Hypoglycemia 07/27/2016  . Allergic rhinitis 08/26/2015  . Airway hyperreactivity 08/26/2015  . Back pain, chronic 08/26/2015  . Diabetes ([place]) 08/26/2015  . Degeneration of lumbar or lumbosacral intervertebral disc 08/26/2015  . Acid reflux 08/26/2015  . Glaucoma 08/26/2015  . Cardiac murmur 08/26/2015  . HLD (hyperlipidemia) 08/26/2015  . Adiposity 08/26/2015  . Severe aortic stenosis 05/10/2015  . Benign essential  HTN 04/29/2015  . Arteriosclerosis of coronary artery 01/26/2015  . Retinopathy 11/19/2014  . Chronic kidney disease (CKD), stage III (moderate) 11/19/2014  . Adult hypothyroidism 02/17/2014    Past Medical History:  Diagnosis Date  . Airway hyperreactivity 08/26/2015  . Aortic stenosis   . Diabetes mellitus ([place])    Takes metformin  . DR (diabetic retinopathy) ([place])   . Environmental allergies   . GERD (gastroesophageal reflux disease)   . Glaucoma   . Hyperlipidemia   . Hypertension   . Hypothyroidism   . Long-term insulin use ([place])   . Microalbuminuria   . Obesity   . Renal insufficiency      diabetic related, "starting to fail"-per patient.(6.30.16)    Social History   Social History  . Marital status: Married    Spouse name: N/A  . Number of children: N/A  . Years of education: N/A   Occupational History  . Not on file.   Social History Main Topics  . Smoking status: Never Smoker  . Smokeless tobacco: Never Used  . Alcohol use No  . Drug use: No  . Sexual activity: Not on file   Other Topics Concern  . Not on file   Social History Narrative  . No narrative on file    Allergies  Allergen Reactions  . Codeine Other (See Comments)    colic  . Sulfa Antibiotics Other (See Comments)    colic  . Azithromycin Rash  . Erythromycin Rash  . Penicillins Rash    Review of Systems  Constitutional: Negative.   Eyes: Negative.   Respiratory: Negative.   Cardiovascular: Negative.   Gastrointestinal: Negative.   Genitourinary: Negative.   Musculoskeletal: Positive for joint pain.  Skin: Negative.   Neurological: Negative.   Endo/Heme/Allergies: Negative.   Psychiatric/Behavioral: Negative.     Immunization History  Administered Date(s) Administered  . Influenza, High Dose Seasonal PF 08/26/2015, 11/02/2016  . Pneumococcal Conjugate-13 02/25/2015  . Pneumococcal Polysaccharide-23 10/06/2013    Objective:  BP (!) 128/58 (BP Location: Left Arm, Patient Position: Sitting, Cuff Size: Large)   Pulse 72   Temp 97.5 F (36.4 C) (Oral)   Resp 16   Wt 194 lb (88 kg)   BMI 33.30 kg/m   Physical Exam  Constitutional: She is oriented to person, place, and time and well-developed, well-nourished, and in no distress.  Eyes: Conjunctivae and EOM are normal. Pupils are equal, round, and reactive to light.  Neck: Normal range of motion. Neck supple.  Cardiovascular: Normal rate, regular rhythm, normal heart sounds and intact distal pulses.   Pulmonary/Chest: Effort normal and breath sounds normal.  Musculoskeletal: Normal range of motion.  straight leg raises  and figure 4 normal.   Neurological: She is alert and oriented to person, place, and time. She has normal reflexes. Gait normal. GCS score is 15.  Skin: Skin is warm and dry.  Psychiatric: Mood, memory, affect and judgment normal.    Lab Results  Component Value Date   WBC 8.2 09/17/2016   HGB 11.7 (L) 09/17/2016   HCT 34.7 (L) 09/17/2016   PLT 258 09/17/2016   GLUCOSE 206 (H) 11/07/2016   CHOL 152 11/07/2016   TRIG 198 (H) 11/07/2016   HDL 43 11/07/2016   LDLCALC 69 11/07/2016   TSH 2.830 11/07/2016   HGBA1C 7.5 (H) 11/07/2016    CMP     Component Value Date/Time   NA 137 11/07/2016 0911   K 4.7 11/07/2016 0911   CL 102  11/07/2016 0911   CO2 22 11/07/2016 0911   GLUCOSE 206 (H) 11/07/2016 0911   GLUCOSE 216 (H) 09/17/2016 2001   BUN 23 11/07/2016 0911   CREATININE 1.22 (H) 11/07/2016 0911   CREATININE 1.13 (H) 06/08/2015 1442   CALCIUM 9.3 11/07/2016 0911   PROT 6.5 11/07/2016 0911   ALBUMIN 4.1 11/07/2016 0911   AST 12 11/07/2016 0911   ALT 15 11/07/2016 0911   ALKPHOS 78 11/07/2016 0911   BILITOT <0.2 11/07/2016 0911   GFRNONAA 43 (L) 11/07/2016 0911   GFRNONAA 48 (L) 06/08/2015 1442   GFRAA 50 (L) 11/07/2016 0911   GFRAA 55 (L) 06/08/2015 1442    Assessment and Plan :  1. Acute right-sided low back pain, with sciatica presence unspecified  - methylPREDNISolone acetate (DEPO-MEDROL) injection 80 mg; Inject 1 mL (80 mg total) into the muscle once. - DG Lumbar Spine Complete; Future - DG HIP UNILAT WITH PELVIS 2-3 VIEWS RIGHT; Future  2. Sciatica of right side Start with supportive care.  HPI, Exam, and A&P Transcribed under the direction and in the presence of Richard L. Cranford Mon, MD  Electronically Signed: Katina Dung, CMA I have done the exam and reviewed the above chart and it is accurate to the best of my knowledge. Development worker, community has been used in this note in any air is in the dictation or transcription are unintentional.  [facility] 02/20/2017 4:01 PM

## 2017-02-23 ENCOUNTER — Telehealth: Payer: Self-pay | Admitting: Family Medicine

## 2017-02-23 NOTE — Telephone Encounter (Signed)
Pt is returning call.  PP#[phone]/DL

## 2017-02-23 NOTE — Progress Notes (Signed)
Advised  ED 

## 2017-02-27 NOTE — Telephone Encounter (Signed)
Try physical therapy referral.

## 2017-02-27 NOTE — Telephone Encounter (Signed)
Pt stated that she had an OV with Dr. Rosanna Randy on 02/20/17 for her back pain. Pt stated that she got a shot that day but her back pain hasn't gotten any better. Pt stated that she would like a call back to advise her of what she can try now. Please advise. Thanks TNP

## 2017-02-27 NOTE — Telephone Encounter (Signed)
Dr. Darnell Level, She got Depo Medrol on 02/20/17

## 2017-03-01 DIAGNOSIS — H401132 Primary open-angle glaucoma, bilateral, moderate stage: Secondary | ICD-10-CM | POA: Diagnosis not present

## 2017-03-08 DIAGNOSIS — H401132 Primary open-angle glaucoma, bilateral, moderate stage: Secondary | ICD-10-CM | POA: Diagnosis not present

## 2017-03-08 LAB — HM DIABETES EYE EXAM

## 2017-03-21 ENCOUNTER — Other Ambulatory Visit: Payer: Self-pay | Admitting: Family Medicine

## 2017-03-21 DIAGNOSIS — I10 Essential (primary) hypertension: Secondary | ICD-10-CM

## 2017-04-12 ENCOUNTER — Other Ambulatory Visit: Payer: Self-pay | Admitting: Obstetrics and Gynecology

## 2017-04-12 DIAGNOSIS — Z1231 Encounter for screening mammogram for malignant neoplasm of breast: Secondary | ICD-10-CM

## 2017-04-12 DIAGNOSIS — Z124 Encounter for screening for malignant neoplasm of cervix: Secondary | ICD-10-CM | POA: Diagnosis not present

## 2017-04-13 DIAGNOSIS — Z794 Long term (current) use of insulin: Secondary | ICD-10-CM | POA: Diagnosis not present

## 2017-04-13 DIAGNOSIS — E1122 Type 2 diabetes mellitus with diabetic chronic kidney disease: Secondary | ICD-10-CM | POA: Diagnosis not present

## 2017-04-13 DIAGNOSIS — N183 Chronic kidney disease, stage 3 (moderate): Secondary | ICD-10-CM | POA: Diagnosis not present

## 2017-04-19 DIAGNOSIS — I350 Nonrheumatic aortic (valve) stenosis: Secondary | ICD-10-CM | POA: Diagnosis not present

## 2017-04-19 DIAGNOSIS — I2510 Atherosclerotic heart disease of native coronary artery without angina pectoris: Secondary | ICD-10-CM | POA: Diagnosis not present

## 2017-04-19 DIAGNOSIS — E782 Mixed hyperlipidemia: Secondary | ICD-10-CM | POA: Diagnosis not present

## 2017-04-19 DIAGNOSIS — I10 Essential (primary) hypertension: Secondary | ICD-10-CM | POA: Diagnosis not present

## 2017-04-20 DIAGNOSIS — E1122 Type 2 diabetes mellitus with diabetic chronic kidney disease: Secondary | ICD-10-CM | POA: Diagnosis not present

## 2017-04-20 DIAGNOSIS — E1129 Type 2 diabetes mellitus with other diabetic kidney complication: Secondary | ICD-10-CM | POA: Diagnosis not present

## 2017-04-20 DIAGNOSIS — E11649 Type 2 diabetes mellitus with hypoglycemia without coma: Secondary | ICD-10-CM | POA: Diagnosis not present

## 2017-04-20 DIAGNOSIS — Z794 Long term (current) use of insulin: Secondary | ICD-10-CM | POA: Diagnosis not present

## 2017-04-20 DIAGNOSIS — E11319 Type 2 diabetes mellitus with unspecified diabetic retinopathy without macular edema: Secondary | ICD-10-CM | POA: Diagnosis not present

## 2017-05-03 ENCOUNTER — Ambulatory Visit: Payer: Medicare Other

## 2017-05-03 ENCOUNTER — Ambulatory Visit: Payer: Medicare Other | Admitting: Family Medicine

## 2017-05-17 ENCOUNTER — Ambulatory Visit (INDEPENDENT_AMBULATORY_CARE_PROVIDER_SITE_OTHER): Payer: Medicare Other

## 2017-05-17 ENCOUNTER — Encounter: Payer: Self-pay | Admitting: Family Medicine

## 2017-05-17 ENCOUNTER — Ambulatory Visit
Admission: RE | Admit: 2017-05-17 | Discharge: 2017-05-17 | Disposition: A | Discharge status: Home / Self Care | Payer: Medicare Other | Source: Ambulatory Visit | Attending: Obstetrics and Gynecology | Admitting: Obstetrics and Gynecology

## 2017-05-17 ENCOUNTER — Ambulatory Visit (INDEPENDENT_AMBULATORY_CARE_PROVIDER_SITE_OTHER): Payer: Medicare Other | Admitting: Family Medicine

## 2017-05-17 VITALS — BP 132/54 | HR 64 | Temp 98.7°F | Wt 197.0 lb

## 2017-05-17 VITALS — BP 132/54 | HR 64 | Temp 98.7°F | Ht 64.0 in | Wt 197.4 lb

## 2017-05-17 DIAGNOSIS — I10 Essential (primary) hypertension: Secondary | ICD-10-CM | POA: Diagnosis not present

## 2017-05-17 DIAGNOSIS — E785 Hyperlipidemia, unspecified: Secondary | ICD-10-CM | POA: Diagnosis not present

## 2017-05-17 DIAGNOSIS — E118 Type 2 diabetes mellitus with unspecified complications: Secondary | ICD-10-CM | POA: Diagnosis not present

## 2017-05-17 DIAGNOSIS — Z0000 Encounter for general adult medical examination without abnormal findings: Secondary | ICD-10-CM | POA: Diagnosis not present

## 2017-05-17 DIAGNOSIS — E039 Hypothyroidism, unspecified: Secondary | ICD-10-CM

## 2017-05-17 DIAGNOSIS — Z1231 Encounter for screening mammogram for malignant neoplasm of breast: Secondary | ICD-10-CM | POA: Insufficient documentation

## 2017-05-17 NOTE — Progress Notes (Signed)
Subjective:   Stacey Pierce is a 76 y.o. female who presents for Medicare Annual (Subsequent) preventive examination.  Review of Systems:  N/A  Cardiac Risk Factors include: advanced age (>22mn, >>34women);diabetes mellitus;dyslipidemia;hypertension;obesity (BMI >30kg/m2)     Objective:     Vitals: BP (!) 132/54 (BP Location: Right Arm)   Pulse 64   Temp 98.7 F (37.1 C) (Oral)   Ht 5' 4"  (1.626 m)   Wt 197 lb 6.4 oz (89.5 kg)   BMI 33.88 kg/m   Body mass index is 33.88 kg/m.   Tobacco History  Smoking Status  . Never Smoker  Smokeless Tobacco  . Never Used     Counseling given: Not Answered   Past Medical History:  Diagnosis Date  . Airway hyperreactivity 08/26/2015  . Aortic stenosis   . Diabetes mellitus ([facility]    Takes metformin  . DR (diabetic retinopathy) ([place]   . Environmental allergies   . GERD (gastroesophageal reflux disease)   . Glaucoma   . Hyperlipidemia   . Hypertension   . Hypothyroidism   . [place]term insulin use ([place]   . Microalbuminuria   . Obesity   . Renal insufficiency    diabetic related, "starting to fail"-per patient.(6.30.16)   Past Surgical History:  Procedure Laterality Date  . CATARACT EXTRACTION     left 2010  . CATARACT EXTRACTION W/PHACO Right 10/13/2015   Procedure: CATARACT EXTRACTION PHACO AND INTRAOCULAR LENS PLACEMENT (IOC);  Surgeon: CLeandrew Koyanagi MD;  [facility]  Service: Ophthalmology;  Laterality: Right;  DIABETIC - insulin and oral meds MALYUGIN SHUGARCAINE  . CHOLECYSTECTOMY     1967  . DILATION AND CURETTAGE OF UTERUS    . LAPAROSCOPIC TUBAL LIGATION    . TONSILLECTOMY     1974   Family History  Problem Relation Age of Onset  . Alzheimer's disease Mother   . Colon polyps Mother   . Hypertension Mother   . Heart attack Father   . Coronary artery disease Father   . Diabetes Father   . Breast cancer Sister   . Colon polyps Sister   . Diabetes Sister   . Diabetes Sister    . Diabetes Sister   . Diabetes Sister    History  Sexual Activity  . Sexual activity: Not on file    Outpatient Encounter Prescriptions as of 05/17/2017  Medication Sig  . amitriptyline (ELAVIL) 10 MG tablet TAKE ONE TABLET BY MOUTH AT BEDTIME  . amLODipine (NORVASC) 5 MG tablet Take 1 tablet (5 mg total) by mouth daily.  .Marland Kitchenaspirin 81 MG tablet Take 81 mg by mouth at bedtime.   .Marland Kitchenatorvastatin (LIPITOR) 40 MG tablet Take 1 tablet (40 mg total) by mouth daily.  . Blood Glucose Monitoring Suppl (FIFTY50 GLUCOSE METER 2.0) w/Device KIT Use as directed.  . carvedilol (COREG) 25 MG tablet TAKE 1 TABLET BY MOUTH 2 TIMES DAILY WITH A MEAL.  . diphenhydrAMINE (BENADRYL) 25 mg capsule Take 25 mg by mouth daily.  . dorzolamide-timolol (COSOPT) 22.3-6.8 MG/ML ophthalmic solution Place 1 drop into both eyes 2 (two) times daily.  .Marland Kitchenglucose blood test strip Use three test strips daily as instructed.  . hydrochlorothiazide (HYDRODIURIL) 25 MG tablet Take 25 mg by mouth daily.  . insulin aspart (NOVOLOG) 100 UNIT/ML injection Inject into the skin 2 (two) times daily.   . insulin detemir (LEVEMIR) 100 UNIT/ML injection Inject 0.18 mLs (18 Units total) into the skin 2 (two) times daily. (Patient  taking differently: Inject into the skin 2 (two) times daily. 20 units in the morning 50 units in the evening)  . latanoprost (XALATAN) 0.005 % ophthalmic solution Place 1 drop into both eyes at bedtime.   Marland Kitchen levothyroxine (SYNTHROID, LEVOTHROID) 100 MCG tablet Take 1 tablet (100 mcg total) by mouth daily. Take early morning EMPTY stomach.  . losartan (COZAAR) 100 MG tablet TAKE 1 TABLET BY MOUTH EVERY DAY  . metFORMIN (GLUCOPHAGE) 1000 MG tablet Take 2,000 mg by mouth daily after supper.  . Multiple Vitamin (MULTIVITAMIN) tablet Take 1 tablet by mouth daily.  Marland Kitchen omeprazole (PRILOSEC OTC) 20 MG tablet Take 20-40 mg by mouth daily.    No facility-administered encounter medications on file as of 05/17/2017.      Activities of Daily Living In your present state of health, do you have any difficulty performing the following activities: 05/17/2017 11/02/2016  Hearing? N N  Vision? Y Y  Difficulty concentrating or making decisions? N N  Walking or climbing stairs? Y Y  Dressing or bathing? N N  Doing errands, shopping? N N  Preparing Food and eating ? N -  Using the Toilet? N -  In the past six months, have you accidently leaked urine? N -  Do you have problems with loss of bowel control? N -  Managing your Medications? N -  Managing your Finances? N -  Housekeeping or managing your Housekeeping? N -  Some recent data might be hidden    Patient Care Team: Jerrol Banana., MD as PCP - General (Family Medicine) Corey Skains, MD as Consulting Physician (Internal Medicine) Rexene Alberts, MD as Consulting Physician (Cardiothoracic Surgery) Leandrew Koyanagi, MD as Referring Physician (Ophthalmology) Gabriel Carina Betsey Holiday, MD as Physician Assistant (Endocrinology) Benjaman Kindler, MD as Consulting Physician (Obstetrics and Gynecology) Lavonia Dana, MD as Consulting Physician (Internal Medicine)    Assessment:     Exercise Activities and Dietary recommendations Current Exercise Habits: The patient does not participate in regular exercise at present, Exercise limited by: None identified  Goals    . Increase water intake          Recommend increasing water intake to 3 glasses a day.       Fall Risk Fall Risk  05/17/2017 11/02/2016 09/14/2016 08/24/2016 08/26/2015  Falls in the past year? No Yes (No Data) Yes No  Number falls in past yr: - 1 - 1 -  Injury with Fall? - No - Yes -  Risk Factor Category  - - - High Fall Risk -  Risk for fall due to : - - - Medication side effect -  Follow up - - - Education provided;Falls prevention discussed -   Depression Screen PHQ 2/9 Scores 05/17/2017 05/17/2017 11/02/2016 08/24/2016  PHQ - 2 Score 0 0 0 0  PHQ- 9 Score 3 - - -      Cognitive Function     6CIT Screen 05/17/2017 11/02/2016  What Year? 0 points 0 points  What month? 0 points 0 points  What time? 0 points 0 points  Count back from 20 0 points 0 points  Months in reverse 0 points 0 points  Repeat phrase 2 points 0 points  Total Score 2 0    Immunization History  Administered Date(s) Administered  . Influenza, High Dose Seasonal PF 08/26/2015, 11/02/2016  . Pneumococcal Conjugate-13 02/25/2015  . Pneumococcal Polysaccharide-23 10/06/2013   Screening Tests Health Maintenance  Topic Date Due  . HEMOGLOBIN A1C  05/08/2017  .  TETANUS/TDAP  12/04/2026 (Originally 12/11/1959)  . INFLUENZA VACCINE  07/04/2017  . OPHTHALMOLOGY EXAM  03/08/2018  . FOOT EXAM  04/20/2018  . DEXA SCAN  Completed  . PNA vac Low Risk Adult  Completed      Plan:  I have personally reviewed and addressed the Medicare Annual Wellness questionnaire and have noted the following in the patient's chart:  A. Medical and social history B. Use of alcohol, tobacco or illicit drugs  C. Current medications and supplements D. Functional ability and status E.  Nutritional status F.  Physical activity G. Advance directives H. List of other physicians I.  Hospitalizations, surgeries, and ER visits in previous 12 months J.  [place] such as hearing and vision if needed, cognitive and depression L. Referrals and appointments - none  In addition, I have reviewed and discussed with patient certain preventive protocols, quality metrics, and best practice recommendations. A written personalized care plan for preventive services as well as general preventive health recommendations were provided to patient.  See attached scanned questionnaire for additional information.   Signed,  Fabio Neighbors, LPN Nurse Health Advisor   MD Recommendations: None, pt declined tetanus today.

## 2017-05-17 NOTE — Progress Notes (Signed)
Subjective:  HPI  Hypertension, follow-up:  BP Readings from Last 3 Encounters:  05/17/17 (!) 132/54  05/17/17 (!) 132/54  02/20/17 (!) 128/58    She was last seen for hypertension 6 months ago.  BP at that visit was 128/58. Management since that visit includes none. She reports good compliance with treatment. She is not having side effects.  She is not exercising. She is adherent to low salt diet.   Outside blood pressures are not being checked. Wt Readings from Last 3 Encounters:  05/17/17 197 lb (89.4 kg)  05/17/17 197 lb 6.4 oz (89.5 kg)  02/20/17 194 lb (88 kg)   ------------------------------------------------------------------------ Pt reports that she saw her cardiac surgeon in May and was told that her aortic valve issue was getting worse.   Prior to Admission medications   Medication Sig Start Date End Date Taking? Authorizing Provider  amitriptyline (ELAVIL) 10 MG tablet TAKE ONE TABLET BY MOUTH AT BEDTIME 05/30/16   Jerrol Banana., MD  amLODipine (NORVASC) 5 MG tablet Take 1 tablet (5 mg total) by mouth daily. 06/30/16   Jerrol Banana., MD  aspirin 81 MG tablet Take 81 mg by mouth at bedtime.  09/30/11   [provider]  atorvastatin (LIPITOR) 40 MG tablet Take 1 tablet (40 mg total) by mouth daily. 02/13/17   Jerrol Banana., MD  Blood Glucose Monitoring Suppl (FIFTY50 GLUCOSE METER 2.0) w/Device KIT Use as directed. 04/20/17 04/20/18  [provider]  carvedilol (COREG) 25 MG tablet TAKE 1 TABLET BY MOUTH 2 TIMES DAILY WITH A MEAL. 03/21/17   Jerrol Banana., MD  diphenhydrAMINE (BENADRYL) 25 mg capsule Take 25 mg by mouth daily.    [provider]  dorzolamide-timolol (COSOPT) 22.3-6.8 MG/ML ophthalmic solution Place 1 drop into both eyes 2 (two) times daily. 07/10/16   [provider]  glucose blood test strip Use three test strips daily as instructed. 04/20/17   [provider]    hydrochlorothiazide (HYDRODIURIL) 25 MG tablet Take 25 mg by mouth daily.    [provider]  insulin aspart (NOVOLOG) 100 UNIT/ML injection Inject into the skin 2 (two) times daily.     [provider]  insulin detemir (LEVEMIR) 100 UNIT/ML injection Inject 0.18 mLs (18 Units total) into the skin 2 (two) times daily. Patient taking differently: Inject into the skin 2 (two) times daily. 20 units in the morning 50 units in the evening 07/28/16   Vaughan Basta, MD  latanoprost (XALATAN) 0.005 % ophthalmic solution Place 1 drop into both eyes at bedtime.     [provider]  levothyroxine (SYNTHROID, LEVOTHROID) 100 MCG tablet Take 1 tablet (100 mcg total) by mouth daily. Take early morning EMPTY stomach. 09/26/16   Jerrol Banana., MD  losartan (COZAAR) 100 MG tablet TAKE 1 TABLET BY MOUTH EVERY DAY 08/02/16   Jerrol Banana., MD  metFORMIN (GLUCOPHAGE) 1000 MG tablet Take 2,000 mg by mouth daily after supper. 12/31/15   [provider]  Multiple Vitamin (MULTIVITAMIN) tablet Take 1 tablet by mouth daily.    [provider]  omeprazole (PRILOSEC OTC) 20 MG tablet Take 20-40 mg by mouth daily.  09/30/11   [provider]    Patient Active Problem List   Diagnosis Date Noted  . Hypoglycemia 07/27/2016  . Allergic rhinitis 08/26/2015  . Airway hyperreactivity 08/26/2015  . Back pain, chronic 08/26/2015  . Diabetes ([place]) 08/26/2015  . Degeneration  of lumbar or lumbosacral intervertebral disc 08/26/2015  . Acid reflux 08/26/2015  . Glaucoma 08/26/2015  . Cardiac murmur 08/26/2015  . HLD (hyperlipidemia) 08/26/2015  . Adiposity 08/26/2015  . Severe aortic stenosis 05/10/2015  . Benign essential HTN 04/29/2015  . Arteriosclerosis of coronary artery 01/26/2015  . Retinopathy 11/19/2014  . Chronic kidney disease (CKD), stage III (moderate) 11/19/2014  . Adult hypothyroidism 02/17/2014    Past Medical History:   Diagnosis Date  . Airway hyperreactivity 08/26/2015  . Aortic stenosis   . Diabetes mellitus ([place])    Takes metformin  . DR (diabetic retinopathy) ([place])   . Environmental allergies   . GERD (gastroesophageal reflux disease)   . Glaucoma   . Hyperlipidemia   . Hypertension   . Hypothyroidism   . Long-term insulin use ([place])   . Microalbuminuria   . Obesity   . Renal insufficiency    diabetic related, "starting to fail"-per patient.(6.30.16)    Social History   Social History  . Marital status: Married    Spouse name: N/A  . Number of children: N/A  . Years of education: N/A   Occupational History  . Not on file.   Social History Main Topics  . Smoking status: Never Smoker  . Smokeless tobacco: Never Used  . Alcohol use No  . Drug use: No  . Sexual activity: Not on file   Other Topics Concern  . Not on file   Social History Narrative  . No narrative on file    Allergies  Allergen Reactions  . Codeine Other (See Comments)    colic  . Sulfa Antibiotics Other (See Comments)    colic  . Azithromycin Rash  . Erythromycin Rash  . Penicillins Rash    Review of Systems  Constitutional: Negative.   HENT: Negative.   Eyes: Negative.   Respiratory: Positive for shortness of breath (when walking, but nothing new for pt.).   Cardiovascular: Negative.   Gastrointestinal: Negative.   Genitourinary: Negative.   Musculoskeletal: Negative.   Skin: Negative.   Neurological: Positive for dizziness (lightheaded when bending over and standing back up. ).  Endo/Heme/Allergies: Negative.   Psychiatric/Behavioral: Negative.     Immunization History  Administered Date(s) Administered  . Influenza, High Dose Seasonal PF 08/26/2015, 11/02/2016  . Pneumococcal Conjugate-13 02/25/2015  . Pneumococcal Polysaccharide-23 10/06/2013    Objective:  BP (!) 132/54   Pulse 64   Temp 98.7 F (37.1 C) (Oral)   Wt 197 lb (89.4 kg)   BMI 33.81 kg/m   Physical Exam   Constitutional: She is oriented to person, place, and time and well-developed, well-nourished, and in no distress.  HENT:  Head: Normocephalic and atraumatic.  Right Ear: External ear normal.  Left Ear: External ear normal.  Nose: Nose normal.  Eyes: Conjunctivae and EOM are normal. Pupils are equal, round, and reactive to light.  Neck: Normal range of motion. Neck supple.  Cardiovascular: Normal rate and regular rhythm.   Murmur (3/6 sysolic murmur of the right upper sternal boarder.) heard. Pulmonary/Chest: Effort normal and breath sounds normal.  Abdominal: Soft.  Musculoskeletal: Normal range of motion.  Neurological: She is alert and oriented to person, place, and time. She has normal reflexes. Gait normal. GCS score is 15.  Skin: Skin is warm and dry.  Psychiatric: Mood, memory, affect and judgment normal.    Lab Results  Component Value Date   WBC 8.2 09/17/2016   HGB 11.7 (L) 09/17/2016   HCT 34.7 (L) 09/17/2016  PLT 258 09/17/2016   GLUCOSE 206 (H) 11/07/2016   CHOL 152 11/07/2016   TRIG 198 (H) 11/07/2016   HDL 43 11/07/2016   LDLCALC 69 11/07/2016   TSH 2.830 11/07/2016   HGBA1C 7.5 (H) 11/07/2016    CMP     Component Value Date/Time   NA 137 11/07/2016 0911   K 4.7 11/07/2016 0911   CL 102 11/07/2016 0911   CO2 22 11/07/2016 0911   GLUCOSE 206 (H) 11/07/2016 0911   GLUCOSE 216 (H) 09/17/2016 2001   BUN 23 11/07/2016 0911   CREATININE 1.22 (H) 11/07/2016 0911   CREATININE 1.13 (H) 06/08/2015 1442   CALCIUM 9.3 11/07/2016 0911   PROT 6.5 11/07/2016 0911   ALBUMIN 4.1 11/07/2016 0911   AST 12 11/07/2016 0911   ALT 15 11/07/2016 0911   ALKPHOS 78 11/07/2016 0911   BILITOT <0.2 11/07/2016 0911   GFRNONAA 43 (L) 11/07/2016 0911   GFRNONAA 48 (L) 06/08/2015 1442   GFRAA 50 (L) 11/07/2016 0911   GFRAA 55 (L) 06/08/2015 1442    Assessment and Plan :  1. Benign essential HTN Stable.   2. Hyperlipidemia, unspecified hyperlipidemia type  3. Type 2  diabetes mellitus with complication, unspecified whether long term insulin use ([place]) Followed by endocrinology  4. Adult hypothyroidism 5.Aortic Valve Disease   HPI, Exam, and A&P Transcribed under the direction and in the presence of Richard L. Cranford Mon, MD  Electronically Signed: Katina Dung, [place] MD [facility] 05/17/2017 3:56 PM

## 2017-05-17 NOTE — Patient Instructions (Signed)
Stacey Pierce , Thank you for taking time to come for your Medicare Wellness Visit. I appreciate your ongoing commitment to your health goals. Please review the following plan we discussed and let me know if I can assist you in the future.   Screening recommendations/referrals: Colonoscopy: completed 09/02/10 Mammogram: completed today 05/17/17 Bone Density: completed > 10 years ago per patient Recommended yearly ophthalmology/optometry visit for glaucoma screening and checkup Recommended yearly dental visit for hygiene and checkup  Vaccinations: Influenza vaccine: up to date, due 08/2017 Pneumococcal vaccine: completed series Tdap vaccine: declined Shingles vaccine: declined  Advanced directives: Advance directive discussed with you today. I have provided a copy for you to complete at home and have notarized. Once this is complete please bring a copy in to our office so we can scan it into your chart.  Conditions/risks identified: Recommend increasing water intake to 3 glasses a day.   Next appointment: None, need to schedule 1 year AWV   Preventive Care 45 Years and Older, Female Preventive care refers to lifestyle choices and visits with your health care provider that can promote health and wellness. What does preventive care include?  A yearly physical exam. This is also called an annual well check.  Dental exams once or twice a year.  Routine eye exams. Ask your health care provider how often you should have your eyes checked.  Personal lifestyle choices, including:  Daily care of your teeth and gums.  Regular physical activity.  Eating a healthy diet.  Avoiding tobacco and drug use.  Limiting alcohol use.  Practicing safe sex.  Taking low-dose aspirin every day.  Taking vitamin and mineral supplements as recommended by your health care provider. What happens during an annual well check? The services and screenings done by your health care provider during your  annual well check will depend on your age, overall health, lifestyle risk factors, and family history of disease. Counseling  Your health care provider may ask you questions about your:  Alcohol use.  Tobacco use.  Drug use.  Emotional well-being.  Home and relationship well-being.  Sexual activity.  Eating habits.  History of falls.  Memory and ability to understand (cognition).  Work and work Statistician.  Reproductive health. Screening  You may have the following tests or measurements:  Height, weight, and BMI.  Blood pressure.  Lipid and cholesterol levels. These may be checked every 5 years, or more frequently if you are over 57 years old.  Skin check.  Lung cancer screening. You may have this screening every year starting at age 66 if you have a 30-pack-year history of smoking and currently smoke or have quit within the past 15 years.  Fecal occult blood test (FOBT) of the stool. You may have this test every year starting at age 51.  Flexible sigmoidoscopy or colonoscopy. You may have a sigmoidoscopy every 5 years or a colonoscopy every 10 years starting at age 54.  Hepatitis C blood test.  Hepatitis B blood test.  Sexually transmitted disease (STD) testing.  Diabetes screening. This is done by checking your blood sugar (glucose) after you have not eaten for a while (fasting). You may have this done every 1-3 years.  Bone density scan. This is done to screen for osteoporosis. You may have this done starting at age 33.  Mammogram. This may be done every 1-2 years. Talk to your health care provider about how often you should have regular mammograms. Talk with your health care provider about your test  results, treatment options, and if necessary, the need for more tests. Vaccines  Your health care provider may recommend certain vaccines, such as:  Influenza vaccine. This is recommended every year.  Tetanus, diphtheria, and acellular pertussis (Tdap, Td)  vaccine. You may need a Td booster every 10 years.  Zoster vaccine. You may need this after age 23.  Pneumococcal 13-valent conjugate (PCV13) vaccine. One dose is recommended after age 39.  Pneumococcal polysaccharide (PPSV23) vaccine. One dose is recommended after age 64. Talk to your health care provider about which screenings and vaccines you need and how often you need them. This information is not intended to replace advice given to you by your health care provider. Make sure you discuss any questions you have with your health care provider. Document Released: 12/17/2015 Document Revised: 08/09/2016 Document Reviewed: 09/21/2015 Elsevier Interactive Patient Education  2017 [place] Prevention in the Home Falls can cause injuries. They can happen to people of all ages. There are many things you can do to make your home safe and to help prevent falls. What can I do on the outside of my home?  Regularly fix the edges of walkways and driveways and fix any cracks.  Remove anything that might make you trip as you walk through a door, such as a raised step or threshold.  Trim any bushes or trees on the path to your home.  Use bright outdoor lighting.  Clear any walking paths of anything that might make someone trip, such as rocks or tools.  Regularly check to see if handrails are loose or broken. Make sure that both sides of any steps have handrails.  Any raised decks and porches should have guardrails on the edges.  Have any leaves, snow, or ice cleared regularly.  Use sand or salt on walking paths during winter.  Clean up any spills in your garage right away. This includes oil or grease spills. What can I do in the bathroom?  Use night lights.  Install grab bars by the toilet and in the tub and shower. Do not use towel bars as grab bars.  Use non-skid mats or decals in the tub or shower.  If you need to sit down in the shower, use a plastic, non-slip  stool.  Keep the floor dry. Clean up any water that spills on the floor as soon as it happens.  Remove soap buildup in the tub or shower regularly.  Attach bath mats securely with double-sided non-slip rug tape.  Do not have throw rugs and other things on the floor that can make you trip. What can I do in the bedroom?  Use night lights.  Make sure that you have a light by your bed that is easy to reach.  Do not use any sheets or blankets that are too big for your bed. They should not hang down onto the floor.  Have a firm chair that has side arms. You can use this for support while you get dressed.  Do not have throw rugs and other things on the floor that can make you trip. What can I do in the kitchen?  Clean up any spills right away.  Avoid walking on wet floors.  Keep items that you use a lot in easy-to-reach places.  If you need to reach something above you, use a strong step stool that has a grab bar.  Keep electrical cords out of the way.  Do not use floor polish or wax that makes  floors slippery. If you must use wax, use non-skid floor wax.  Do not have throw rugs and other things on the floor that can make you trip. What can I do with my stairs?  Do not leave any items on the stairs.  Make sure that there are handrails on both sides of the stairs and use them. Fix handrails that are broken or loose. Make sure that handrails are as long as the stairways.  Check any carpeting to make sure that it is firmly attached to the stairs. Fix any carpet that is loose or worn.  Avoid having throw rugs at the top or bottom of the stairs. If you do have throw rugs, attach them to the floor with carpet tape.  Make sure that you have a light switch at the top of the stairs and the bottom of the stairs. If you do not have them, ask someone to add them for you. What else can I do to help prevent falls?  Wear shoes that:  Do not have high heels.  Have rubber bottoms.  Are  comfortable and fit you well.  Are closed at the toe. Do not wear sandals.  If you use a stepladder:  Make sure that it is fully opened. Do not climb a closed stepladder.  Make sure that both sides of the stepladder are locked into place.  Ask someone to hold it for you, if possible.  Clearly mark and make sure that you can see:  Any grab bars or handrails.  First and last steps.  Where the edge of each step is.  Use tools that help you move around (mobility aids) if they are needed. These include:  Canes.  Walkers.  Scooters.  Crutches.  Turn on the lights when you go into a dark area. Replace any light bulbs as soon as they burn out.  Set up your furniture so you have a clear path. Avoid moving your furniture around.  If any of your floors are uneven, fix them.  If there are any pets around you, be aware of where they are.  Review your medicines with your doctor. Some medicines can make you feel dizzy. This can increase your chance of falling. Ask your doctor what other things that you can do to help prevent falls. This information is not intended to replace advice given to you by your health care provider. Make sure you discuss any questions you have with your health care provider. Document Released: 09/16/2009 Document Revised: 04/27/2016 Document Reviewed: 12/25/2014 Elsevier Interactive Patient Education  2017 [employer].

## 2017-05-24 DIAGNOSIS — N183 Chronic kidney disease, stage 3 (moderate): Secondary | ICD-10-CM | POA: Diagnosis not present

## 2017-05-24 DIAGNOSIS — E1122 Type 2 diabetes mellitus with diabetic chronic kidney disease: Secondary | ICD-10-CM | POA: Diagnosis not present

## 2017-05-24 DIAGNOSIS — R809 Proteinuria, unspecified: Secondary | ICD-10-CM | POA: Diagnosis not present

## 2017-05-24 DIAGNOSIS — I129 Hypertensive chronic kidney disease with stage 1 through stage 4 chronic kidney disease, or unspecified chronic kidney disease: Secondary | ICD-10-CM | POA: Diagnosis not present

## 2017-05-24 LAB — BASIC METABOLIC PANEL
BUN: 31 — AB (ref 4–21)
CREATININE: 1.6 — AB (ref 0.5–1.1)
Glucose: 138
Potassium: 4.9 (ref 3.4–5.3)
Sodium: 140 (ref 137–147)

## 2017-06-05 ENCOUNTER — Encounter: Payer: Self-pay | Admitting: Family Medicine

## 2017-06-05 ENCOUNTER — Ambulatory Visit (INDEPENDENT_AMBULATORY_CARE_PROVIDER_SITE_OTHER): Payer: Medicare Other | Admitting: Family Medicine

## 2017-06-05 VITALS — BP 128/62 | HR 65 | Temp 97.5°F | Wt 195.8 lb

## 2017-06-05 DIAGNOSIS — M5137 Other intervertebral disc degeneration, lumbosacral region: Secondary | ICD-10-CM

## 2017-06-05 DIAGNOSIS — M160 Bilateral primary osteoarthritis of hip: Secondary | ICD-10-CM

## 2017-06-05 MED ORDER — METHYLPREDNISOLONE ACETATE 80 MG/ML IJ SUSP
80.0000 mg | Freq: Once | INTRAMUSCULAR | Status: AC
  Administered 2017-06-05: 80 mg via INTRAMUSCULAR

## 2017-06-05 NOTE — Patient Instructions (Signed)
Hip Pain The hip is the joint between the upper legs and the lower pelvis. The bones, cartilage, tendons, and muscles of your hip joint support your body and allow you to move around. Hip pain can range from a minor ache to severe pain in one or both of your hips. The pain may be felt on the inside of the hip joint near the groin, or the outside near the buttocks and upper thigh. You may also have swelling or stiffness. Follow these instructions at home: Managing pain, stiffness, and swelling   If directed, apply ice to the injured area.  Put ice in a plastic bag.  Place a towel between your skin and the bag.  Leave the ice on for 20 minutes, 2-3 times a day  Sleep with a pillow between your legs on your most comfortable side.  Avoid any activities that cause pain. General instructions   Take over-the-counter and prescription medicines only as told by your health care provider.  Do any exercises as told by your health care provider.  Record the following:  How often you have hip pain.  The location of your pain.  What the pain feels like.  What makes the pain worse.  Keep all follow-up visits as told by your health care provider. This is important. Contact a health care provider if:  You cannot put weight on your leg.  Your pain or swelling continues or gets worse after one week.  It gets harder to walk.  You have a fever. Get help right away if:  You fall.  You have a sudden increase in pain and swelling in your hip.  Your hip is red or swollen or very tender to touch. Summary  Hip pain can range from a minor ache to severe pain in one or both of your hips.  The pain may be felt on the inside of the hip joint near the groin, or the outside near the buttocks and upper thigh.  Avoid any activities that cause pain.  Record how often you have hip pain, the location of the pain, what makes it worse and what it feels like. This information is not intended to  replace advice given to you by your health care provider. Make sure you discuss any questions you have with your health care provider. Document Released: 05/10/2010 Document Revised: 10/23/2016 Document Reviewed: 10/23/2016 Elsevier Interactive Patient Education  2017 [employer] Inc[employer]  

## 2017-06-05 NOTE — Progress Notes (Signed)
Patient: Stacey Pierce Female    DOB: 04-09-1941   76 y.o.   MRN: [id number] Visit Date: 06/05/2017  Today's Provider: Vernie Murders, PA   Chief Complaint  Patient presents with  . Hip Pain   Subjective:    Hip Pain   There was no injury mechanism. The pain is present in the right hip. The quality of the pain is described as aching. The pain has been constant since onset. She reports no foreign bodies present. Exacerbated by: arthritis. Treatments tried: Dr. Rosanna Randy gave a injection in right hip a couple weeks ago. Patient has a history of degeneration of lumbar spine, chronic back pain , and arthritis  The treatment provided significant relief.   Patient Active Problem List   Diagnosis Date Noted  . Hypoglycemia 07/27/2016  . Allergic rhinitis 08/26/2015  . Airway hyperreactivity 08/26/2015  . Back pain, chronic 08/26/2015  . Diabetes ([place]) 08/26/2015  . Degeneration of lumbar or lumbosacral intervertebral disc 08/26/2015  . Acid reflux 08/26/2015  . Glaucoma 08/26/2015  . Cardiac murmur 08/26/2015  . HLD (hyperlipidemia) 08/26/2015  . Adiposity 08/26/2015  . Severe aortic stenosis 05/10/2015  . Benign essential HTN 04/29/2015  . Arteriosclerosis of coronary artery 01/26/2015  . Retinopathy 11/19/2014  . Chronic kidney disease (CKD), stage III (moderate) 11/19/2014  . Adult hypothyroidism 02/17/2014   Past Surgical History:  Procedure Laterality Date  . CATARACT EXTRACTION     left 2010  . CATARACT EXTRACTION W/PHACO Right 10/13/2015   Procedure: CATARACT EXTRACTION PHACO AND INTRAOCULAR LENS PLACEMENT (IOC);  Surgeon: Leandrew Koyanagi, MD;  Location: [facility];  Service: Ophthalmology;  Laterality: Right;  DIABETIC - insulin and oral meds MALYUGIN SHUGARCAINE  . CHOLECYSTECTOMY     1967  . DILATION AND CURETTAGE OF UTERUS    . LAPAROSCOPIC TUBAL LIGATION    . TONSILLECTOMY     1974   Family History  Problem Relation Age of Onset  . Alzheimer's  disease Mother   . Colon polyps Mother   . Hypertension Mother   . Heart attack Father   . Coronary artery disease Father   . Diabetes Father   . Breast cancer Sister   . Colon polyps Sister   . Diabetes Sister   . Diabetes Sister   . Diabetes Sister   . Diabetes Sister    Allergies  Allergen Reactions  . Codeine Other (See Comments)    colic  . Sulfa Antibiotics Other (See Comments)    colic  . Azithromycin Rash  . Erythromycin Rash  . Penicillins Rash     Previous Medications   AMITRIPTYLINE (ELAVIL) 10 MG TABLET    TAKE ONE TABLET BY MOUTH AT BEDTIME   AMLODIPINE (NORVASC) 5 MG TABLET    Take 1 tablet (5 mg total) by mouth daily.   ASPIRIN 81 MG TABLET    Take 81 mg by mouth at bedtime.    ATORVASTATIN (LIPITOR) 40 MG TABLET    Take 1 tablet (40 mg total) by mouth daily.   BLOOD GLUCOSE MONITORING SUPPL (FIFTY50 GLUCOSE METER 2.0) W/DEVICE KIT    Use as directed.   CARVEDILOL (COREG) 25 MG TABLET    TAKE 1 TABLET BY MOUTH 2 TIMES DAILY WITH A MEAL.   DIPHENHYDRAMINE (BENADRYL) 25 MG CAPSULE    Take 25 mg by mouth daily.   DORZOLAMIDE-TIMOLOL (COSOPT) 22.3-6.8 MG/ML OPHTHALMIC SOLUTION    Place 1 drop into both eyes 2 (two) times daily.   GLUCOSE BLOOD TEST  STRIP    Use three test strips daily as instructed.   HYDROCHLOROTHIAZIDE (HYDRODIURIL) 25 MG TABLET    Take 25 mg by mouth daily.   INSULIN ASPART (NOVOLOG) 100 UNIT/ML INJECTION    Inject into the skin 2 (two) times daily.    INSULIN DETEMIR (LEVEMIR) 100 UNIT/ML INJECTION    Inject 0.18 mLs (18 Units total) into the skin 2 (two) times daily.   LATANOPROST (XALATAN) 0.005 % OPHTHALMIC SOLUTION    Place 1 drop into both eyes at bedtime.    LEVOTHYROXINE (SYNTHROID, LEVOTHROID) 100 MCG TABLET    Take 1 tablet (100 mcg total) by mouth daily. Take early morning EMPTY stomach.   LOSARTAN (COZAAR) 100 MG TABLET    TAKE 1 TABLET BY MOUTH EVERY DAY   METFORMIN (GLUCOPHAGE) 1000 MG TABLET    Take 2,000 mg by mouth daily after  supper.   MULTIPLE VITAMIN (MULTIVITAMIN) TABLET    Take 1 tablet by mouth daily.   OMEPRAZOLE (PRILOSEC OTC) 20 MG TABLET    Take 20-40 mg by mouth daily.     Review of Systems  Constitutional: Negative.   Cardiovascular: Negative.   Musculoskeletal: Positive for arthralgias (hip pain) and back pain.    Social History  Substance Use Topics  . Smoking status: Never Smoker  . Smokeless tobacco: Never Used  . Alcohol use No   Objective:   BP 128/62 (BP Location: Right Arm, Patient Position: Sitting, Cuff Size: Normal)   Pulse 65   Temp (!) 97.5 F (36.4 C) (Oral)   Wt 195 lb 12.8 oz (88.8 kg)   SpO2 96%   BMI 33.61 kg/m    Physical Exam  Constitutional: She is oriented to person, place, and time. She appears well-developed and well-nourished.  HENT:  Head: Normocephalic.  Left Ear: External ear normal.  Eyes: Conjunctivae and EOM are normal. Pupils are equal, round, and reactive to light.  Neck: Normal range of motion. Neck supple.  Cardiovascular: Normal rate, regular rhythm and intact distal pulses.   Murmur heard. Pulmonary/Chest: Effort normal and breath sounds normal.  Abdominal: Soft.  Musculoskeletal:  Right hip/sciatic pain without radiation or tenderness. Decreased rotation of both hips due to arthritis documented on x-rays of 02-20-17. Good pulses and strength.  Neurological: She is alert and oriented to person, place, and time.  Psychiatric: She has a normal mood and affect.      Assessment & Plan:     1. Bilateral hip joint arthritis Recent recurrence of pain in the right hip. Had Depo-Medrol injection (IM) 02-20-17 that seemed to help a great deal. X-rays at that time confirmed degenerative arthritis in both hips and lumbar spine. Afraid to consider NSAID's due to chronic kidney disease. Will give another injection and a handicap placard form for DMV. If pains recur frequently, may need referral to an orthopedist. - methylPREDNISolone acetate (DEPO-MEDROL)  injection 80 mg; Inject 1 mL (80 mg total) into the muscle once.  2. Degeneration of lumbar or lumbosacral intervertebral disc No radiation of pain from hips to legs or back today. X-rays on 02-20-17 confirmed degenerative disease. Apply ice pack or moist heat prn.

## 2017-06-07 ENCOUNTER — Encounter: Payer: Self-pay | Admitting: Family Medicine

## 2017-06-20 ENCOUNTER — Other Ambulatory Visit: Payer: Self-pay | Admitting: Family Medicine

## 2017-06-25 ENCOUNTER — Ambulatory Visit: Payer: Medicare Other | Admitting: Thoracic Surgery (Cardiothoracic Vascular Surgery)

## 2017-07-20 DIAGNOSIS — E11649 Type 2 diabetes mellitus with hypoglycemia without coma: Secondary | ICD-10-CM | POA: Diagnosis not present

## 2017-07-20 DIAGNOSIS — Z794 Long term (current) use of insulin: Secondary | ICD-10-CM | POA: Diagnosis not present

## 2017-07-23 ENCOUNTER — Ambulatory Visit (INDEPENDENT_AMBULATORY_CARE_PROVIDER_SITE_OTHER): Payer: Medicare Other | Admitting: Thoracic Surgery (Cardiothoracic Vascular Surgery)

## 2017-07-23 ENCOUNTER — Encounter: Payer: Self-pay | Admitting: Thoracic Surgery (Cardiothoracic Vascular Surgery)

## 2017-07-23 VITALS — BP 139/49 | HR 65 | Resp 16 | Ht 64.0 in | Wt 190.0 lb

## 2017-07-23 DIAGNOSIS — I350 Nonrheumatic aortic (valve) stenosis: Secondary | ICD-10-CM

## 2017-07-23 NOTE — Progress Notes (Signed)
Stacey Pierce       [place] [address]             [id number]     CARDIOTHORACIC SURGERY OFFICE NOTE  Referring Provider is Corey Skains, MD PCP is Jerrol Banana., MD   HPI:  Patient is a 76 year old obese female with history of aortic stenosis, hypertension, type 2 diabetes mellitus with complications, chronic kidney disease, and hyperlipidemia who returns to the office today for routine follow-up of aortic stenosis. She was originally seen in consultation on 05/11/2015. At that time the patient claimed to be essentially asymptomatic, and echocardiogram revealed normal left ventricular systolic function with hemodynamic findings c/w moderate aortic stenosis. She was last seen here in our office on 06/19/2016 and more recently she was seen by Dr. Nehemiah Massed on 04/20/2017. Follow-up echocardiogram performed at that time demonstrated significant progression of disease with peak velocity across the aortic valve reported 3.8 m/s corresponding to peak and mean transvalvular gradients estimated 58 and 35.1 mmHg, respectively. Left ventricular systolic function remained normal with ejection fraction estimated 60%. The DVI was not reported. There was moderate aortic insufficiency and moderate mitral regurgitation reported. The patient returns to our office today for follow-up. She states that over the past year she has "slowed down" considerably. She states that she "gives out" when she is walking.  She gets some shortness of breath with activity but she denies any resting shortness of breath. She has had some dizzy spells without syncope. She gets fatigued easily. She has not had any chest pain or chest tightness although she was seen in the emergency department not too long ago with an episode of discomfort in her left shoulder that was unrelated to physical activity.   Current Outpatient Prescriptions  Medication Sig Dispense Refill  . amitriptyline (ELAVIL) 10 MG  tablet TAKE ONE TABLET BY MOUTH AT BEDTIME 90 tablet 3  . amLODipine (NORVASC) 5 MG tablet Take 1 tablet (5 mg total) by mouth daily. 90 tablet 3  . aspirin 81 MG tablet Take 81 mg by mouth at bedtime.     Marland Kitchen atorvastatin (LIPITOR) 40 MG tablet Take 1 tablet (40 mg total) by mouth daily. 90 tablet 3  . Blood Glucose Monitoring Suppl (FIFTY50 GLUCOSE METER 2.0) w/Device KIT Use as directed.    . carvedilol (COREG) 25 MG tablet TAKE 1 TABLET BY MOUTH 2 TIMES DAILY WITH A MEAL. 180 tablet 3  . diphenhydrAMINE (BENADRYL) 25 mg capsule Take 25 mg by mouth daily.    . dorzolamide-timolol (COSOPT) 22.3-6.8 MG/ML ophthalmic solution Place 1 drop into both eyes 2 (two) times daily.  5  . glucose blood test strip Use three test strips daily as instructed.    . insulin aspart (NOVOLOG) 100 UNIT/ML injection Inject into the skin 2 (two) times daily.     . insulin detemir (LEVEMIR) 100 UNIT/ML injection Inject 0.18 mLs (18 Units total) into the skin 2 (two) times daily. (Patient taking differently: Inject into the skin 2 (two) times daily. 20 units in the morning 50 units in the evening) 10 mL 11  . latanoprost (XALATAN) 0.005 % ophthalmic solution Place 1 drop into both eyes at bedtime.     Marland Kitchen levothyroxine (SYNTHROID, LEVOTHROID) 100 MCG tablet Take 1 tablet (100 mcg total) by mouth daily. Take early morning EMPTY stomach. 90 tablet 3  . losartan (COZAAR) 100 MG tablet TAKE 1 TABLET BY MOUTH EVERY DAY 90 tablet 3  .  metFORMIN (GLUCOPHAGE) 1000 MG tablet Take 2,000 mg by mouth daily after supper.    . Multiple Vitamin (MULTIVITAMIN) tablet Take 1 tablet by mouth daily.    Marland Kitchen omeprazole (PRILOSEC OTC) 20 MG tablet Take 20-40 mg by mouth daily.      No current facility-administered medications for this visit.       Physical Exam:   BP (!) 139/49 (BP Location: Right Arm, Patient Position: Sitting, Cuff Size: Large)   Pulse 65   Resp 16   Ht 5' 4"  (1.626 m)   Wt 190 lb (86.2 kg)   SpO2 97%   BMI 32.61  kg/m   General:  Obese but well appearing  Chest:   Clear to auscultation  CV:   Regular rate and rhythm with harsh systolic murmur heard best along the right sternal border  Incisions:  n/a  Abdomen:  Soft nontender  Extremities:  Warm and well-perfused  Diagnostic Tests:  TRANSTHORACIC ECHOCARDIOGRAM  Report from transthoracic echocardiogram performed 04/20/2017 at the [facility] is reviewed. Images from this echocardiogram are not currently available for review.  By report there was moderate thickening of the aortic valve leaflets with peak velocity across the aortic valve measured 3.8 m/s. This is increased from 3.3 m/s on echocardiogram performed 01/07/2015. DVI was not reported. There was moderate aortic insufficiency and moderate mitral regurgitation. Left ventricular systolic function was reportedly normal with ejection fraction had smooth 60%.   Impression:  Patient has developed stage D severe symptomatic aortic stenosis. Recent follow-up echocardiogram demonstrates significant progression of disease although left ventricular systolic function remains normal. I think it is probably time to consider elective surgical intervention. Previous diagnostic catheterization performed more than 2 years ago revealed diffuse coronary artery disease but no significant flow limiting lesions. Given the patient's history of diabetes repeat catheterization probably should be performed.  Risks associated with surgical intervention using conventional surgical techniques would certainly be at least moderately elevated because of the patient's age, comorbid medical conditions, and the degree of physical deconditioning. In the absence of significant coronary artery disease, the patient might be a reasonably good candidate for transcatheter aortic valve replacement.    Plan:  I discussed matters at length with the patient and her family in the office today. The indications for surgical intervention  at been discussed as have been long-term prognosis with continued medical therapy. Alternative surgical approaches have been discussed including a comparison between conventional surgical techniques and transcatheter aortic valve replacement. The patient is interested in proceeding with further diagnostic testing to consider intervention in the near future.  We will contact Dr. Alveria Apley office to consider proceeding with follow-up left and right heart catheterization in the near future.  Depending upon findings she will subsequently undergo CT angiography to further characterize the feasibility of transcatheter aortic valve replacement as an alternative to conventional surgery. The patient will return to our office in 3-4 weeks to review the results of these tests and discuss options further. He understands that with each administration of IV contrast there is some risk of nephrotoxicity, and measures will be taken to minimize at risk.  I spent in excess of 15 minutes during the conduct of this office consultation and >50% of this time involved direct face-to-face encounter with the patient for counseling and/or coordination of their care.    Valentina Gu. Roxy Manns, MD 07/23/2017 11:56 AM

## 2017-07-23 NOTE — Patient Instructions (Signed)
Continue all previous medications without any changes at this time  

## 2017-07-27 DIAGNOSIS — N183 Chronic kidney disease, stage 3 (moderate): Secondary | ICD-10-CM | POA: Diagnosis not present

## 2017-07-27 DIAGNOSIS — Z794 Long term (current) use of insulin: Secondary | ICD-10-CM | POA: Diagnosis not present

## 2017-07-27 DIAGNOSIS — E1122 Type 2 diabetes mellitus with diabetic chronic kidney disease: Secondary | ICD-10-CM | POA: Diagnosis not present

## 2017-07-27 DIAGNOSIS — I350 Nonrheumatic aortic (valve) stenosis: Secondary | ICD-10-CM | POA: Diagnosis not present

## 2017-07-27 DIAGNOSIS — Z01818 Encounter for other preprocedural examination: Secondary | ICD-10-CM | POA: Diagnosis not present

## 2017-07-27 DIAGNOSIS — R809 Proteinuria, unspecified: Secondary | ICD-10-CM | POA: Diagnosis not present

## 2017-07-27 DIAGNOSIS — I10 Essential (primary) hypertension: Secondary | ICD-10-CM | POA: Diagnosis not present

## 2017-07-30 DIAGNOSIS — I350 Nonrheumatic aortic (valve) stenosis: Secondary | ICD-10-CM

## 2017-08-02 ENCOUNTER — Other Ambulatory Visit: Payer: Self-pay

## 2017-08-02 ENCOUNTER — Encounter: Payer: Self-pay | Admitting: *Deleted

## 2017-08-02 ENCOUNTER — Other Ambulatory Visit: Payer: Self-pay | Admitting: Family Medicine

## 2017-08-02 ENCOUNTER — Ambulatory Visit
Admission: RE | Admit: 2017-08-02 | Discharge: 2017-08-02 | Disposition: A | Discharge status: Home / Self Care | Payer: Medicare Other | Source: Ambulatory Visit | Attending: Internal Medicine | Admitting: Internal Medicine

## 2017-08-02 ENCOUNTER — Encounter
Admission: RE | Disposition: A | Discharge status: Home / Self Care | Payer: Self-pay | Source: Ambulatory Visit | Attending: Internal Medicine

## 2017-08-02 DIAGNOSIS — Z7982 Long term (current) use of aspirin: Secondary | ICD-10-CM | POA: Insufficient documentation

## 2017-08-02 DIAGNOSIS — E11319 Type 2 diabetes mellitus with unspecified diabetic retinopathy without macular edema: Secondary | ICD-10-CM | POA: Insufficient documentation

## 2017-08-02 DIAGNOSIS — E669 Obesity, unspecified: Secondary | ICD-10-CM | POA: Diagnosis not present

## 2017-08-02 DIAGNOSIS — E785 Hyperlipidemia, unspecified: Secondary | ICD-10-CM | POA: Insufficient documentation

## 2017-08-02 DIAGNOSIS — Z6831 Body mass index (BMI) 31.0-31.9, adult: Secondary | ICD-10-CM | POA: Insufficient documentation

## 2017-08-02 DIAGNOSIS — E039 Hypothyroidism, unspecified: Secondary | ICD-10-CM | POA: Diagnosis not present

## 2017-08-02 DIAGNOSIS — I10 Essential (primary) hypertension: Secondary | ICD-10-CM | POA: Insufficient documentation

## 2017-08-02 DIAGNOSIS — I2510 Atherosclerotic heart disease of native coronary artery without angina pectoris: Secondary | ICD-10-CM | POA: Diagnosis not present

## 2017-08-02 DIAGNOSIS — Z880 Allergy status to penicillin: Secondary | ICD-10-CM | POA: Diagnosis not present

## 2017-08-02 DIAGNOSIS — I350 Nonrheumatic aortic (valve) stenosis: Secondary | ICD-10-CM

## 2017-08-02 DIAGNOSIS — Z794 Long term (current) use of insulin: Secondary | ICD-10-CM | POA: Diagnosis not present

## 2017-08-02 DIAGNOSIS — Z882 Allergy status to sulfonamides status: Secondary | ICD-10-CM | POA: Diagnosis not present

## 2017-08-02 HISTORY — PX: RIGHT/LEFT HEART CATH AND CORONARY ANGIOGRAPHY: CATH118266

## 2017-08-02 SURGERY — RIGHT/LEFT HEART CATH AND CORONARY ANGIOGRAPHY
Anesthesia: Moderate Sedation

## 2017-08-02 MED ORDER — SODIUM CHLORIDE 0.9% FLUSH: 3.0000 mL | Freq: Two times a day (BID) | INTRAVENOUS | Status: DC

## 2017-08-02 MED ORDER — SODIUM CHLORIDE 0.9% FLUSH: 3.0000 mL | INTRAVENOUS | Status: DC | PRN

## 2017-08-02 MED ORDER — MIDAZOLAM HCL 2 MG/2ML IJ SOLN
INTRAMUSCULAR | Status: AC
  Filled 2017-08-02: qty 2

## 2017-08-02 MED ORDER — SODIUM CHLORIDE 0.9 % WEIGHT BASED INFUSION: 1.0000 mL/kg/h | INTRAVENOUS | Status: DC

## 2017-08-02 MED ORDER — ASPIRIN 81 MG PO CHEW
81.0000 mg | CHEWABLE_TABLET | ORAL | Status: AC
  Administered 2017-08-02: 81 mg via ORAL

## 2017-08-02 MED ORDER — ACETAMINOPHEN 325 MG PO TABS: 650.0000 mg | ORAL_TABLET | ORAL | Status: DC | PRN

## 2017-08-02 MED ORDER — SODIUM CHLORIDE 0.9 % WEIGHT BASED INFUSION
3.0000 mL/kg/h | INTRAVENOUS | Status: AC
  Administered 2017-08-02: 3 mL/kg/h via INTRAVENOUS

## 2017-08-02 MED ORDER — HEPARIN (PORCINE) IN NACL 2-0.9 UNIT/ML-% IJ SOLN
INTRAMUSCULAR | Status: AC
  Filled 2017-08-02: qty 500

## 2017-08-02 MED ORDER — SODIUM CHLORIDE 0.9 % IV SOLN: 250.0000 mL | INTRAVENOUS | Status: DC | PRN

## 2017-08-02 MED ORDER — LIDOCAINE HCL (PF) 1 % IJ SOLN
INTRAMUSCULAR | Status: AC
  Filled 2017-08-02: qty 30

## 2017-08-02 MED ORDER — FENTANYL CITRATE (PF) 100 MCG/2ML IJ SOLN
INTRAMUSCULAR | Status: DC | PRN
  Administered 2017-08-02: 25 ug via INTRAVENOUS

## 2017-08-02 MED ORDER — FENTANYL CITRATE (PF) 100 MCG/2ML IJ SOLN
INTRAMUSCULAR | Status: AC
  Filled 2017-08-02: qty 2

## 2017-08-02 MED ORDER — ONDANSETRON HCL 4 MG/2ML IJ SOLN: 4.0000 mg | Freq: Four times a day (QID) | INTRAMUSCULAR | Status: DC | PRN

## 2017-08-02 MED ORDER — IOPAMIDOL (ISOVUE-300) INJECTION 61%
INTRAVENOUS | Status: DC | PRN
  Administered 2017-08-02: 150 mL via INTRA_ARTERIAL

## 2017-08-02 MED ORDER — ASPIRIN 81 MG PO CHEW
CHEWABLE_TABLET | ORAL | Status: AC
  Filled 2017-08-02: qty 1

## 2017-08-02 MED ORDER — MIDAZOLAM HCL 2 MG/2ML IJ SOLN
INTRAMUSCULAR | Status: DC | PRN
  Administered 2017-08-02: 1 mg via INTRAVENOUS

## 2017-08-02 SURGICAL SUPPLY — 16 items
CATH 5FR JL4 DIAGNOSTIC (CATHETERS) ×2 IMPLANT
CATH INFINITI 5 FR MPA2 (CATHETERS) ×2 IMPLANT
CATH INFINITI 5FR AL1 (CATHETERS) ×2 IMPLANT
CATH INFINITI 5FR ANG PIGTAIL (CATHETERS) ×2 IMPLANT
CATH INFINITI JR4 5F (CATHETERS) ×2 IMPLANT
CATH SWANZ 7F THERMO (CATHETERS) ×2 IMPLANT
DEVICE CLOSURE MYNXGRIP 6/7F (Vascular Products) ×4 IMPLANT
KIT MANI 3VAL PERCEP (MISCELLANEOUS) ×2 IMPLANT
KIT RIGHT HEART (MISCELLANEOUS) ×2 IMPLANT
NEEDLE PERC 18GX7CM (NEEDLE) ×2 IMPLANT
PACK CARDIAC CATH (CUSTOM PROCEDURE TRAY) ×2 IMPLANT
SHEATH PINNACLE 5F 10CM (SHEATH) IMPLANT
SHEATH PINNACLE 6F 10CM (SHEATH) ×2 IMPLANT
SHEATH PINNACLE 7F 10CM (SHEATH) ×2 IMPLANT
WIRE EMERALD 3MM-J .035X150CM (WIRE) ×2 IMPLANT
WIRE EMERALD ST .035X150CM (WIRE) ×2 IMPLANT

## 2017-08-03 ENCOUNTER — Other Ambulatory Visit: Payer: Self-pay | Admitting: *Deleted

## 2017-08-03 DIAGNOSIS — N289 Disorder of kidney and ureter, unspecified: Secondary | ICD-10-CM

## 2017-08-03 DIAGNOSIS — I350 Nonrheumatic aortic (valve) stenosis: Secondary | ICD-10-CM

## 2017-08-07 ENCOUNTER — Telehealth: Payer: Self-pay | Admitting: Cardiovascular Disease

## 2017-08-07 NOTE — Telephone Encounter (Signed)
New message   Pt is calling with questions about instructions before procedure. Please call

## 2017-08-07 NOTE — Telephone Encounter (Signed)
I spoke with the pt and she wanted to make me aware that previously in 2016 when she had a cardiac CT radiology had difficulty getting an IV started.  The pt wanted to make the hospital aware of this information.  I advised the pt that I will follow-up with radiology in regards to this issue. I asked if the pt received her pre TAVR testing instructions in the mail and she did get them today and did not have any additional questions.

## 2017-08-08 ENCOUNTER — Other Ambulatory Visit: Payer: Self-pay | Admitting: Family Medicine

## 2017-08-08 NOTE — Telephone Encounter (Signed)
I have discussed this pt with the Radiology department at [facility] and they will notify IR to put the patient on the schedule.

## 2017-08-09 ENCOUNTER — Encounter (HOSPITAL_COMMUNITY): Payer: Medicare Other

## 2017-08-13 ENCOUNTER — Encounter (HOSPITAL_COMMUNITY): Payer: Self-pay

## 2017-08-13 ENCOUNTER — Ambulatory Visit (HOSPITAL_COMMUNITY)
Admission: RE | Admit: 2017-08-13 | Discharge: 2017-08-13 | Disposition: A | Discharge status: Home / Self Care | Payer: Medicare Other | Source: Ambulatory Visit | Attending: Cardiovascular Disease | Admitting: Cardiovascular Disease

## 2017-08-13 ENCOUNTER — Ambulatory Visit (HOSPITAL_COMMUNITY): Payer: Medicare Other

## 2017-08-13 DIAGNOSIS — Z952 Presence of prosthetic heart valve: Secondary | ICD-10-CM | POA: Diagnosis not present

## 2017-08-13 DIAGNOSIS — I350 Nonrheumatic aortic (valve) stenosis: Secondary | ICD-10-CM

## 2017-08-13 DIAGNOSIS — N1330 Unspecified hydronephrosis: Secondary | ICD-10-CM | POA: Diagnosis not present

## 2017-08-13 DIAGNOSIS — N289 Disorder of kidney and ureter, unspecified: Secondary | ICD-10-CM

## 2017-08-13 MED ORDER — IOPAMIDOL (ISOVUE-370) INJECTION 76%
INTRAVENOUS | Status: AC
  Administered 2017-08-13: 100 mL
  Filled 2017-08-13: qty 100

## 2017-08-13 MED ORDER — IOPAMIDOL (ISOVUE-370) INJECTION 76%
INTRAVENOUS | Status: AC
  Filled 2017-08-13: qty 50

## 2017-08-13 MED ORDER — SODIUM BICARBONATE BOLUS VIA INFUSION
INTRAVENOUS | Status: AC
  Administered 2017-08-13: 1 meq via INTRAVENOUS
  Filled 2017-08-13: qty 1

## 2017-08-13 MED ORDER — SODIUM BICARBONATE 8.4 % IV SOLN
INTRAVENOUS | Status: AC
  Administered 2017-08-13: 16:00:00 via INTRAVENOUS
  Filled 2017-08-13: qty 500

## 2017-08-13 NOTE — Progress Notes (Signed)
Pt arrived from CT.  Bicarb drip started for 2 hour post infusion per order. VSS

## 2017-08-14 ENCOUNTER — Ambulatory Visit (HOSPITAL_COMMUNITY)
Admission: RE | Admit: 2017-08-14 | Discharge: 2017-08-14 | Disposition: A | Discharge status: Home / Self Care | Payer: Medicare Other | Source: Ambulatory Visit | Attending: Cardiovascular Disease | Admitting: Cardiovascular Disease

## 2017-08-14 ENCOUNTER — Ambulatory Visit (HOSPITAL_COMMUNITY): Payer: Medicare Other

## 2017-08-14 ENCOUNTER — Ambulatory Visit (INDEPENDENT_AMBULATORY_CARE_PROVIDER_SITE_OTHER): Payer: Medicare Other | Admitting: Thoracic Surgery (Cardiothoracic Vascular Surgery)

## 2017-08-14 ENCOUNTER — Encounter: Payer: Self-pay | Admitting: Thoracic Surgery (Cardiothoracic Vascular Surgery)

## 2017-08-14 ENCOUNTER — Encounter: Payer: Self-pay | Admitting: Physical Therapy

## 2017-08-14 ENCOUNTER — Ambulatory Visit: Payer: Medicare Other | Attending: Cardiovascular Disease | Admitting: Physical Therapy

## 2017-08-14 VITALS — BP 132/60 | HR 64 | Resp 16 | Ht 64.0 in | Wt 190.0 lb

## 2017-08-14 DIAGNOSIS — I350 Nonrheumatic aortic (valve) stenosis: Secondary | ICD-10-CM

## 2017-08-14 DIAGNOSIS — M6281 Muscle weakness (generalized): Secondary | ICD-10-CM | POA: Diagnosis not present

## 2017-08-14 DIAGNOSIS — R2689 Other abnormalities of gait and mobility: Secondary | ICD-10-CM | POA: Insufficient documentation

## 2017-08-14 DIAGNOSIS — R293 Abnormal posture: Secondary | ICD-10-CM | POA: Diagnosis not present

## 2017-08-14 DIAGNOSIS — I6523 Occlusion and stenosis of bilateral carotid arteries: Secondary | ICD-10-CM | POA: Diagnosis not present

## 2017-08-14 LAB — VAS US CAROTID
LCCAPDIAS: 13 cm/s
LEFT ECA DIAS: 0 cm/s
LEFT VERTEBRAL DIAS: -13 cm/s
LICADDIAS: -29 cm/s
LICAPDIAS: -22 cm/s
LICAPSYS: -79 cm/s
Left CCA dist dias: 14 cm/s
Left CCA dist sys: 76 cm/s
Left CCA prox sys: 86 cm/s
Left ICA dist sys: -102 cm/s
RIGHT ECA DIAS: 0 cm/s
RIGHT VERTEBRAL DIAS: 11 cm/s
Right CCA prox dias: 18 cm/s
Right CCA prox sys: 120 cm/s
Right cca dist sys: -107 cm/s

## 2017-08-14 LAB — PULMONARY FUNCTION TEST
DL/VA % PRED: 80 %
DL/VA: 3.97 ml/min/mmHg/L
DLCO unc % pred: 52 %
DLCO unc: 13.42 ml/min/mmHg
FEF 25-75 POST: 0.87 L/s
FEF 25-75 Pre: 1.64 L/sec
FEF2575-%CHANGE-POST: -46 %
FEF2575-%PRED-PRE: 103 %
FEF2575-%Pred-Post: 55 %
FEV1-%Change-Post: -13 %
FEV1-%Pred-Post: 63 %
FEV1-%Pred-Pre: 73 %
FEV1-PRE: 1.56 L
FEV1-Post: 1.35 L
FEV1FVC-%CHANGE-POST: -4 %
FEV1FVC-%Pred-Pre: 111 %
FEV6-%CHANGE-POST: -14 %
FEV6-%Pred-Post: 59 %
FEV6-%Pred-Pre: 69 %
FEV6-PRE: 1.88 L
FEV6-Post: 1.61 L
FEV6FVC-%PRED-PRE: 105 %
FEV6FVC-%Pred-Post: 105 %
FVC-%CHANGE-POST: -9 %
FVC-%PRED-PRE: 66 %
FVC-%Pred-Post: 60 %
FVC-PRE: 1.88 L
FVC-Post: 1.71 L
POST FEV1/FVC RATIO: 79 %
PRE FEV1/FVC RATIO: 83 %
Post FEV6/FVC ratio: 100 %
Pre FEV6/FVC Ratio: 100 %
RV % pred: 104 %
RV: 2.49 L
TLC % pred: 88 %
TLC: 4.58 L

## 2017-08-14 MED ORDER — ALBUTEROL SULFATE (2.5 MG/3ML) 0.083% IN NEBU
2.5000 mg | INHALATION_SOLUTION | Freq: Once | RESPIRATORY_TRACT | Status: AC
  Administered 2017-08-14: 2.5 mg via RESPIRATORY_TRACT

## 2017-08-14 NOTE — Progress Notes (Signed)
[facility]       CARDIOTHORACIC SURGERY NOTE  Referring Provider is Corey Skains, MD PCP is Jerrol Banana., MD   HPI:  Patient is a 76 year old obese white female with history of aortic stenosis, hypertension, type 2 diabetes mellitus with complications, chronic kidney disease, and hyperlipidemia who returns to the office today for follow-up of severe symptomatic aortic stenosis. She was last seen here in our office on 07/23/2017. Since then she underwent left and right heart catheterization by Dr. Nehemiah Massed on 08/02/2017. Catheterization confirmed the presence of moderate nonobstructive on he artery disease and severe aortic stenosis. Peak to peak and mean transvalvular gradients across the aortic valve were reported 58 and 32 mmHg, respectively, corresponding to aortic valve area calculated 0.9 cm. She has normal left ventricular systolic function with moderate left ventricular hypertrophy. Right heart pressures were minimally elevated. She subsequently underwent CT angiography to further characterize the feasibility of transcatheter aortic valve replacement and she returns to the office today to discuss treatment options further. She reports no new problems or complaints over the last few weeks.  She continues to describe stable symptoms of exertional shortness of breath and fatigue.   Current Outpatient Prescriptions  Medication Sig Dispense Refill  . acetaminophen (TYLENOL) 500 MG tablet Take 1,000 mg by mouth every 6 (six) hours as needed for mild pain.    Marland Kitchen amitriptyline (ELAVIL) 10 MG tablet TAKE ONE TABLET BY MOUTH AT BEDTIME 90 tablet 3  . amLODipine (NORVASC) 5 MG tablet TAKE 1 TABLET (5 MG TOTAL) BY MOUTH DAILY. 90 tablet 3  . aspirin 81 MG tablet Take 81 mg by mouth at bedtime.     Marland Kitchen atorvastatin (LIPITOR) 40 MG tablet Take 1 tablet (40 mg total) by mouth daily. (Patient taking differently: Take 40 mg by mouth at  bedtime. ) 90 tablet 3  . Blood Glucose Monitoring Suppl (FIFTY50 GLUCOSE METER 2.0) w/Device KIT Use as directed.    . carvedilol (COREG) 25 MG tablet TAKE 1 TABLET BY MOUTH 2 TIMES DAILY WITH A MEAL. 180 tablet 3  . diphenhydrAMINE (BENADRYL) 25 mg capsule Take 25 mg by mouth daily.    . dorzolamide-timolol (COSOPT) 22.3-6.8 MG/ML ophthalmic solution Place 1 drop into both eyes 2 (two) times daily.  5  . glucose blood test strip Use three test strips daily as instructed.    . insulin aspart (NOVOLOG) 100 UNIT/ML injection Inject 10-15 Units into the skin 2 (two) times daily before a meal. Takes 10 units before breakfast and 15 units before dinner.    . insulin detemir (LEVEMIR) 100 UNIT/ML injection Inject 0.18 mLs (18 Units total) into the skin 2 (two) times daily. (Patient taking differently: Inject 20-50 Units into the skin 2 (two) times daily. 20 units in the morning 50 units in the evening) 10 mL 11  . latanoprost (XALATAN) 0.005 % ophthalmic solution Place 1 drop into both eyes at bedtime.     Marland Kitchen levothyroxine (SYNTHROID, LEVOTHROID) 100 MCG tablet Take 1 tablet (100 mcg total) by mouth daily. Take early morning EMPTY stomach. 90 tablet 3  . losartan (COZAAR) 100 MG tablet TAKE 1 TABLET BY MOUTH EVERY DAY 90 tablet 3  . metFORMIN (GLUCOPHAGE) 1000 MG tablet Take 2,000 mg by mouth daily after supper.    Marland Kitchen omeprazole (PRILOSEC OTC) 20 MG tablet Take 20 mg by mouth daily.      No current facility-administered medications for this visit.  Physical Exam:   Ht _0  (1.626 m)   Wt 190 lb (86.2 kg)   BMI 32.61 kg/m   General:  Well-appearing  Chest:   Clear to auscultation  CV:   Prominent systolic murmur  Incisions:  n/a  Abdomen:  Soft and nontender  Extremities:  Warm and well-perfused  Diagnostic Tests:  RIGHT/LEFT HEART CATH AND CORONARY ANGIOGRAPHY  Conclusion     Prox RCA lesion, 25 %stenosed.  Mid RCA lesion, 20 %stenosed.  Ost Cx to Prox Cx lesion, 10  %stenosed.  Ost LAD to Prox LAD lesion, 15 %stenosed.  There is severe aortic valve stenosis. There is trivial (1+) aortic regurgitation.  There is trivial (1+) aortic regurgitation.   Assessment The patient has had Acute on chronic CCS III symptoms and significant progression of aortic stenosis  normal left ventricular function with ejection fraction of 60% with moderate lvh  Pulmonary capillary wedge pressures with no elevation. no pulmonary hypertension  severe aortic stenosis with a valve area of 0.9 centimeters squared and a mean gradient of 32 mm Hg and peak gradient of 92m Hg  abnormal - minimal atherosclerosis of  coronary arteries with minimal 3 vessel arterial disease and up to 25% stenosis  Plan Aggressive medical management of current symptoms with appropriate use of beta blockers, ACE inhibitors, and diuretics. Consider consultation for aortic valve replacement   Indications   Aortic valve stenosis, etiology of cardiac valve disease unspecified [I35.0 (ICD-10-CM)]  Procedural Details/Technique   Technical Details Procedural details: The right groin was prepped, draped, and anesthetized with 1% lidocaine. Using modified Seldinger technique, a 6 French sheath was introduced into the right femoral artery. Standard Judkins catheters were used for coronary angiography right heart with swan ganz and laortiography. Catheter exchanges were performed over a guidewire. There were no immediate procedural complications. The patient was transferred to the post catheterization recovery area for further monitoring.  During this procedure the patient is administered a total of Versed 1 mg and Fentanyl 25 mg to achieve and maintain moderate conscious sedation. The patient's heart rate, blood pressure, and oxygen saturation are monitored continuously during the procedure. The period of conscious sedation is 33 minutes, of which I was present face-to-face 100% of this  time.    Estimated blood loss <50 mL.  During this procedure the patient was administered the following to achieve and maintain moderate conscious sedation: Versed mg, while the patient's heart rate, blood pressure, and oxygen saturation were continuously monitored.    Coronary Findings   Dominance: Left  Left Anterior Descending  Ost LAD to Prox LAD lesion, 15% stenosed.  Left Circumflex  Ost Cx to Prox Cx lesion, 10% stenosed.  Right Coronary Artery  Prox RCA lesion, 25% stenosed.  Mid RCA lesion, 20% stenosed.  Wall Motion              Left Heart   Aortic Valve There is severe aortic valve stenosis. There is trivial (1+) aortic regurgitation. The aortic valve is calcified. There is restricted aortic valve motion.    Aorta Aortic Root: The aortic root is enlarged. The aortic root displays mild dilation. There is trivial (1+) aortic regurgitation. There is a significant gradient across the aortic valve.    Coronary Diagrams   Diagnostic Diagram           Impression:Cardiac TAVR CT  TECHNIQUE: The patient was scanned on a [employer] A 120 kV retrospective scan was triggered in the descending thoracic aorta at 111  HU's. Gantry rotation speed was 250 msecs and collimation was .6 mm. No beta blockade or nitro were given. The 3D data set was reconstructed in 5% intervals of the R-R cycle. Systolic and diastolic phases were analyzed on a dedicated work station using MPR, MIP and VRT modes. The patient received 80 cc of contrast.  FINDINGS: Aortic Valve: Trileaflet, but functionally bileaflet aortic valve with partially co-joined left and right leaflets. The leaflets are severely thickened and moderately calcified with only trivial calcifications extending into the LVOT.  Aorta: Normal size. Moderate diffuse calcifications in the aortic arch and descending aorta. No dissection.  Sinotubular Junction:  30 x 29 mm  Ascending Thoracic Aorta:  35  x 35 mm  Aortic Arch:  25 x 24 mm  Descending Thoracic Aorta:  17 x 17 mm  Sinus of Valsalva Measurements:  Non-coronary:  31 mm  Right -coronary:  27 mm  Left -coronary:  32 mm  Coronary Artery Height above Annulus:  Left Main:  14 mm  Right Coronary:  14 mm  Virtual Basal Annulus Measurements:  Maximum/Minimum Diameter:  24 x 20 mm  Perimeter:  72 mm  Area:  397 mm  Coronary Arteries:  Normal origin.  Optimum Fluoroscopic Angle for Delivery:  LAO 3 CAU 2  IMPRESSION: 1. Trileaflet, but functionally bileaflet aortic valve with partially co-joined left and right leaflets. The leaflets are severely thickened and moderately calcified with only trivial calcifications extending into the LVOT. Annular measurements suitable for delivery of a 23 mm Edwards-SAPIEN 3 valve.  2. Sufficient coronary to annular distance.  3. Optimum Fluoroscopic Angle for Delivery: LAO 3 CAU 2  4. There is a large left atrial appendage with no evidence for a thrombus.  Ena Dawley   Electronically Signed   By: Ena Dawley   On: 08/13/2017 19:50   Impression:  Patient has stage D severe symptomatic aortic stenosis with normal left ventricular systolic function.  She describes stable symptoms of exertional shortness of breath consistent with chronic diastolic congestive heart failure, New York Heart Association functional class II.  I have personally reviewed the patient's most recent diagnostic cardiac catheterization which reveals moderate nonobstructive coronary artery disease and normal left ventricular systolic function. Peak to peak and mean transvalvular gradients at the time of catheterization were measured 58 and 32 mmHg, respectively, corresponding to aortic valve area calculated 0.9 cm.  Cardiac-gated CTA of the heart reveals anatomical characteristics consistent with aortic stenosis suitable for treatment by transcatheter aortic valve replacement  without any significant complicating features and CTA of the aorta and iliac vessels demonstrate what appears to be adequate pelvic vascular access to facilitate a transfemoral approach, although this scan has not yet been formally read by radiologist.  Risks associated with conventional surgical aortic valve replacement would be at least an early elevated because of the patient's age, comorbid medical conditions, and significant physical deconditioning. The patient's convalescence following even uncomplicated surgery would likely be somewhat slow. I feel that transcatheter aortic valve replacement would be somewhat less risky and considerably less morbidity.   Plan:  The patient and her husband were again counseled at length regarding treatment alternatives for management of severe symptomatic aortic stenosis. Alternative approaches such as conventional aortic valve replacement, transcatheter aortic valve replacement, and palliative medical therapy were compared and contrasted at length.  The risks associated with conventional surgical aortic valve replacement were been discussed in detail, as were expectations for post-operative convalescence.  Issues specific to transcatheter aortic valve replacement  were discussed including questions about long term valve durability, the potential for paravalvular leak, possible increased risk of need for permanent pacemaker placement, and other technical complications related to the procedure itself.  Long-term prognosis with medical therapy was discussed. This discussion was placed in the context of the patient's own specific clinical presentation and past medical history.  All of their questions been addressed.  The patient hopes to proceed with transcatheter aortic valve replacement in the near future. We tentatively plan for elective transcatheter aortic valve replacement via percutaneous transfemoral approach on 09/04/2017.  Following the decision to proceed with  transcatheter aortic valve replacement, a discussion has been held regarding what types of management strategies would be attempted intraoperatively in the event of life-threatening complications, including whether or not the patient would be considered a candidate for the use of cardiopulmonary bypass and/or conversion to open sternotomy for attempted surgical intervention.  The patient has been advised of a variety of complications that might develop including but not limited to risks of death, stroke, paravalvular leak, aortic dissection or other major vascular complications, aortic annulus rupture, device embolization, cardiac rupture or perforation, mitral regurgitation, acute myocardial infarction, arrhythmia, heart block or bradycardia requiring permanent pacemaker placement, congestive heart failure, respiratory failure, renal failure, pneumonia, infection, other late complications related to structural valve deterioration or migration, or other complications that might ultimately cause a temporary or permanent loss of functional independence or other long term morbidity.  The patient provides full informed consent for the procedure as described and all questions were answered.   I spent in excess of 15 minutes during the conduct of this office consultation and >50% of this time involved direct face-to-face encounter with the patient for counseling and/or coordination of their care.    Valentina Gu. Roxy Manns, MD 08/14/2017 10:19 AM

## 2017-08-14 NOTE — Progress Notes (Signed)
**  Preliminary report by tech**  Carotid artery duplex complete. Findings are consistent with a 1-39 percent stenosis involving the right internal carotid artery and the left internal carotid artery. The vertebral arteries demonstrate antegrade flow.  08/14/17 11:49 AM Carlos Levering RVT

## 2017-08-14 NOTE — Therapy (Signed)
[place], [place], [address] Phone: [phone]   Fax:  [phone]  Physical Therapy Evaluation  Patient Details  Name: Stacey Pierce MRN: [id number] Date of Birth: 05/10/1941 Referring Provider: Dr. Sherren Mocha  Encounter Date: 08/14/2017      PT End of Session - 08/14/17 1402    Visit Number 1   PT Start Time 1400   PT Stop Time 1432   PT Time Calculation (min) 32 min      Past Medical History:  Diagnosis Date  . Airway hyperreactivity 08/26/2015  . Aortic stenosis   . Diabetes mellitus ([place])    Takes metformin  . DR (diabetic retinopathy) ([place])   . Environmental allergies   . GERD (gastroesophageal reflux disease)   . Glaucoma   . Hyperlipidemia   . Hypertension   . Hypothyroidism   . Long-term insulin use ([place])   . Microalbuminuria   . Obesity   . Renal insufficiency    diabetic related, "starting to fail"-per patient.(6.30.16)    Past Surgical History:  Procedure Laterality Date  . CATARACT EXTRACTION     left 2010  . CATARACT EXTRACTION W/PHACO Right 10/13/2015   Procedure: CATARACT EXTRACTION PHACO AND INTRAOCULAR LENS PLACEMENT (IOC);  Surgeon: Leandrew Koyanagi, MD;  Location: [facility];  Service: Ophthalmology;  Laterality: Right;  DIABETIC - insulin and oral meds MALYUGIN SHUGARCAINE  . CHOLECYSTECTOMY     1967  . DILATION AND CURETTAGE OF UTERUS    . LAPAROSCOPIC TUBAL LIGATION    . RIGHT/LEFT HEART CATH AND CORONARY ANGIOGRAPHY N/A 08/02/2017   Procedure: RIGHT/LEFT HEART CATH AND CORONARY ANGIOGRAPHY;  Surgeon: Corey Skains, MD;  Location: [facility];  Service: Cardiovascular;  Laterality: N/A;  . TONSILLECTOMY     1974    There were no vitals filed for this visit.       Subjective Assessment - 08/14/17 1404    Subjective Pt reports she has slowed down over past year due to fatigue and shortness of breath with activity. Pt also reports  some dizziness but denies chest pain or syncope.    Patient Stated Goals to fix heart problem, improve community walking distance   Currently in Pain? No/denies            [facility] PT Assessment - 08/14/17 0001      Assessment   Medical Diagnosis severe aortic stenosis   Referring Provider Dr. Sherren Mocha   Onset Date/Surgical Date --  about a year ago     Precautions   Precautions None     Restrictions   Weight Bearing Restrictions No     Balance Screen   Has the patient fallen in the past 6 months No   Has the patient had a decrease in activity level because of a fear of falling?  No   Is the patient reluctant to leave their home because of a fear of falling?  No     Home Environment   Living Environment Private residence   Living Arrangements Spouse/significant other   Type of [place] to enter   Entrance Stairs-Number of Steps 2   Entrance Stairs-Rails Right;Left;Cannot reach both   [place] One level     Prior Function   Level of Independence Independent with community mobility without device     Posture/Postural Control   Posture/Postural Control Postural limitations   Postural Limitations Rounded Shoulders;Forward head  ROM / Strength   AROM / PROM / Strength AROM;Strength     AROM   Overall AROM Comments grossly WNL     Strength   Overall Strength Comments grossly 4/5 UE, grossly 4/5 lower extremity except hip 3+/5   Strength Assessment Site Hand   Right/Left hand Right;Left   Right Hand Grip (lbs) 33  R hand dominant   Left Hand Grip (lbs) 30     Ambulation/Gait   Gait Comments Pt ambulates without signfificant deviation with slow speed. Gait distance is limited by 45% for her age/gender.           [facility] Pre-Surgical Assessment - September 11, 2017 0001    5 Meter Walk Test- trial 1 10 sec   5 Meter Walk Test- trial 2 8 sec.    5 Meter Walk Test- trial 3 8 sec.   5 meter walk test average 8.67 sec   4 Stage Balance Test  tolerated for:  3 sec.   4 Stage Balance Test Position 2   Comment needs UE support   ADL/IADL Independent with: Bathing;Dressing;Meal prep   ADL/IADL Needs Assistance with: V[place] Leaver work   ADL/IADL Fraility Index Vulnerable   6 Minute Walk- Baseline yes   BP (mmHg) 135/67   HR (bpm) 64   02 Sat (%RA) 95 %   Modified Borg Scale for Dyspnea 0- Nothing at all   Perceived Rate of Exertion (Borg) 6-   6 Minute Walk Post Test yes   BP (mmHg) 148/63   HR (bpm) 89   02 Sat (%RA) 96 %   Modified Borg Scale for Dyspnea 3- Moderate shortness of breath or breathing difficulty   Perceived Rate of Exertion (Borg) 13- Somewhat hard   Aerobic Endurance Distance Walked 845          Objective measurements completed on examination: See above findings.                  PT Education - September 11, 2017 1521    Education provided Yes   Education Details fall risk   Person(s) Educated Patient   Methods Explanation   Comprehension Verbalized understanding                     Plan - Sep 11, 2017 1521    Clinical Impression Statement see below   PT Frequency One time visit     Clinical Impression Statement: Pt is a 76 yo female presenting to OP PT for evaluation prior to possible TAVR surgery due to severe aortic stenosis. Pt reports onset of fatigue and shortness of breath over the past year. Pt presents with good ROM and fair strength, poor balance and is at high fall risk 4 stage balance test, slow walking speed and fair aerobic endurance per 6 minute walk test. Pt ambulated a total of 845 feet in 6 minute walk. Based on the Short Physical Performance Battery, patient has a frailty rating of 4/12 with </= 5/12 considered frail.   Patient demonstrated the following deficits and impairments:     Visit Diagnosis: Other abnormalities of gait and mobility  Muscle weakness (generalized)  Abnormal posture      G-Codes - 09/11/17 1521    Functional Assessment Tool Used (Outpatient  Only) 6 minute walk 845'   Functional Limitation Mobility: Walking and moving around   Mobility: Walking and Moving Around Current Status ([id number]) At least 40 percent but less than 60 percent impaired, limited or restricted   Mobility: Walking and Moving Around  Goal Status [phone]) At least 40 percent but less than 60 percent impaired, limited or restricted   Mobility: Walking and Moving Around Discharge Status [phone]) At least 40 percent but less than 60 percent impaired, limited or restricted       Problem List Patient Active Problem List   Diagnosis Date Noted  . Aortic stenosis, severe 07/30/2017  . Hypoglycemia 07/27/2016  . Allergic rhinitis 08/26/2015  . Airway hyperreactivity 08/26/2015  . Back pain, chronic 08/26/2015  . Diabetes ([place]) 08/26/2015  . Degeneration of lumbar or lumbosacral intervertebral disc 08/26/2015  . Acid reflux 08/26/2015  . Glaucoma 08/26/2015  . Cardiac murmur 08/26/2015  . HLD (hyperlipidemia) 08/26/2015  . Adiposity 08/26/2015  . Aortic stenosis   . Benign essential HTN 04/29/2015  . Arteriosclerosis of coronary artery 01/26/2015  . Retinopathy 11/19/2014  . Chronic kidney disease (CKD), stage III (moderate) 11/19/2014  . Adult hypothyroidism 02/17/2014    [place], PT 08/14/2017, 3:22 PM  [facility] [address] [place], [place], [address] Phone: [phone]   Fax:  [phone]  Name: Stacey Pierce MRN: [id number] Date of Birth: 1941-03-30

## 2017-08-14 NOTE — Patient Instructions (Signed)
   Continue taking all current medications without change through the day before surgery.  Have nothing to eat or drink after midnight the night before surgery.  On the morning of surgery take only Norvasc, Synthroid and Prilosec with a sip of water.

## 2017-08-15 ENCOUNTER — Other Ambulatory Visit: Payer: Self-pay

## 2017-08-15 ENCOUNTER — Other Ambulatory Visit: Payer: Self-pay | Admitting: *Deleted

## 2017-08-15 DIAGNOSIS — I350 Nonrheumatic aortic (valve) stenosis: Secondary | ICD-10-CM

## 2017-08-24 DIAGNOSIS — N183 Chronic kidney disease, stage 3 (moderate): Secondary | ICD-10-CM | POA: Diagnosis not present

## 2017-08-24 DIAGNOSIS — E1122 Type 2 diabetes mellitus with diabetic chronic kidney disease: Secondary | ICD-10-CM | POA: Diagnosis not present

## 2017-08-24 DIAGNOSIS — I10 Essential (primary) hypertension: Secondary | ICD-10-CM | POA: Diagnosis not present

## 2017-08-24 DIAGNOSIS — I350 Nonrheumatic aortic (valve) stenosis: Secondary | ICD-10-CM | POA: Diagnosis not present

## 2017-08-24 DIAGNOSIS — I2510 Atherosclerotic heart disease of native coronary artery without angina pectoris: Secondary | ICD-10-CM | POA: Diagnosis not present

## 2017-08-30 ENCOUNTER — Encounter (HOSPITAL_COMMUNITY): Payer: Self-pay

## 2017-08-30 ENCOUNTER — Encounter (HOSPITAL_COMMUNITY)
Admission: RE | Admit: 2017-08-30 | Discharge: 2017-08-30 | Disposition: A | Discharge status: Home / Self Care | Payer: Medicare Other | Source: Ambulatory Visit | Attending: Thoracic Surgery (Cardiothoracic Vascular Surgery) | Admitting: Thoracic Surgery (Cardiothoracic Vascular Surgery)

## 2017-08-30 ENCOUNTER — Encounter: Payer: Medicare Other | Admitting: Surgery

## 2017-08-30 ENCOUNTER — Encounter (HOSPITAL_COMMUNITY)
Admission: RE | Admit: 2017-08-30 | Discharge: 2017-08-30 | Disposition: A | Discharge status: Home / Self Care | Payer: Medicare Other | Source: Ambulatory Visit | Attending: Cardiovascular Disease | Admitting: Cardiovascular Disease

## 2017-08-30 DIAGNOSIS — N183 Chronic kidney disease, stage 3 (moderate): Secondary | ICD-10-CM | POA: Diagnosis not present

## 2017-08-30 DIAGNOSIS — Z01818 Encounter for other preprocedural examination: Secondary | ICD-10-CM | POA: Diagnosis not present

## 2017-08-30 DIAGNOSIS — Z01810 Encounter for preprocedural cardiovascular examination: Secondary | ICD-10-CM | POA: Diagnosis not present

## 2017-08-30 DIAGNOSIS — E669 Obesity, unspecified: Secondary | ICD-10-CM | POA: Insufficient documentation

## 2017-08-30 DIAGNOSIS — Z794 Long term (current) use of insulin: Secondary | ICD-10-CM | POA: Diagnosis not present

## 2017-08-30 DIAGNOSIS — E11319 Type 2 diabetes mellitus with unspecified diabetic retinopathy without macular edema: Secondary | ICD-10-CM | POA: Diagnosis not present

## 2017-08-30 DIAGNOSIS — E1122 Type 2 diabetes mellitus with diabetic chronic kidney disease: Secondary | ICD-10-CM | POA: Diagnosis not present

## 2017-08-30 DIAGNOSIS — Z950 Presence of cardiac pacemaker: Secondary | ICD-10-CM | POA: Diagnosis not present

## 2017-08-30 DIAGNOSIS — I129 Hypertensive chronic kidney disease with stage 1 through stage 4 chronic kidney disease, or unspecified chronic kidney disease: Secondary | ICD-10-CM | POA: Insufficient documentation

## 2017-08-30 DIAGNOSIS — Z01812 Encounter for preprocedural laboratory examination: Secondary | ICD-10-CM | POA: Insufficient documentation

## 2017-08-30 DIAGNOSIS — R001 Bradycardia, unspecified: Secondary | ICD-10-CM | POA: Diagnosis not present

## 2017-08-30 DIAGNOSIS — I517 Cardiomegaly: Secondary | ICD-10-CM | POA: Diagnosis not present

## 2017-08-30 DIAGNOSIS — I350 Nonrheumatic aortic (valve) stenosis: Secondary | ICD-10-CM

## 2017-08-30 DIAGNOSIS — E785 Hyperlipidemia, unspecified: Secondary | ICD-10-CM | POA: Insufficient documentation

## 2017-08-30 LAB — URINALYSIS, ROUTINE W REFLEX MICROSCOPIC
Bilirubin Urine: NEGATIVE
Glucose, UA: NEGATIVE mg/dL
Hgb urine dipstick: NEGATIVE
Ketones, ur: NEGATIVE mg/dL
Nitrite: NEGATIVE
Protein, ur: NEGATIVE mg/dL
Specific Gravity, Urine: 1.015 (ref 1.005–1.030)
pH: 5 (ref 5.0–8.0)

## 2017-08-30 LAB — COMPREHENSIVE METABOLIC PANEL
ALT: 14 U/L (ref 14–54)
AST: 17 U/L (ref 15–41)
Albumin: 3.7 g/dL (ref 3.5–5.0)
Alkaline Phosphatase: 75 U/L (ref 38–126)
Anion gap: 5 (ref 5–15)
BUN: 22 mg/dL — ABNORMAL HIGH (ref 6–20)
CO2: 21 mmol/L — ABNORMAL LOW (ref 22–32)
Calcium: 9.1 mg/dL (ref 8.9–10.3)
Chloride: 111 mmol/L (ref 101–111)
Creatinine, Ser: 1.35 mg/dL — ABNORMAL HIGH (ref 0.44–1.00)
GFR calc Af Amer: 43 mL/min — ABNORMAL LOW (ref >60)
GFR calc non Af Amer: 37 mL/min — ABNORMAL LOW (ref >60)
Glucose, Bld: 199 mg/dL — ABNORMAL HIGH (ref 65–99)
Potassium: 4.8 mmol/L (ref 3.5–5.1)
Sodium: 137 mmol/L (ref 135–145)
Total Bilirubin: 0.5 mg/dL (ref 0.3–1.2)
Total Protein: 6.4 g/dL — ABNORMAL LOW (ref 6.5–8.1)

## 2017-08-30 LAB — SURGICAL PCR SCREEN
MRSA, PCR: NEGATIVE
Staphylococcus aureus: POSITIVE — AB

## 2017-08-30 LAB — BLOOD GAS, ARTERIAL
ACID-BASE DEFICIT: 5.2 mmol/L — AB (ref 0.0–2.0)
BICARBONATE: 19.5 mmol/L — AB (ref 20.0–28.0)
DRAWN BY: 421801
O2 SAT: 97.4 %
PCO2 ART: 36.9 mmHg (ref 32.0–48.0)
Patient temperature: 98.6
pH, Arterial: 7.343 — ABNORMAL LOW (ref 7.350–7.450)
pO2, Arterial: 103 mmHg (ref 83.0–108.0)

## 2017-08-30 LAB — CBC
HCT: 31.4 % — ABNORMAL LOW (ref 36.0–46.0)
Hemoglobin: 9.9 g/dL — ABNORMAL LOW (ref 12.0–15.0)
MCH: 27.4 pg (ref 26.0–34.0)
MCHC: 31.5 g/dL (ref 30.0–36.0)
MCV: 87 fL (ref 78.0–100.0)
Platelets: 231 10*3/uL (ref 150–400)
RBC: 3.61 MIL/uL — ABNORMAL LOW (ref 3.87–5.11)
RDW: 13.2 % (ref 11.5–15.5)
WBC: 8.3 10*3/uL (ref 4.0–10.5)

## 2017-08-30 LAB — TYPE AND SCREEN
ABO/RH(D): O POS
ANTIBODY SCREEN: NEGATIVE

## 2017-08-30 LAB — ABO/RH: ABO/RH(D): O POS

## 2017-08-30 LAB — GLUCOSE, CAPILLARY: Glucose-Capillary: 199 mg/dL — ABNORMAL HIGH (ref 65–99)

## 2017-08-30 LAB — HEMOGLOBIN A1C
Hgb A1c MFr Bld: 7.5 % — ABNORMAL HIGH (ref 4.8–5.6)
Mean Plasma Glucose: 168.55 mg/dL

## 2017-08-30 LAB — PROTIME-INR
INR: 1.03
Prothrombin Time: 13.5 seconds (ref 11.4–15.2)

## 2017-08-30 LAB — APTT: aPTT: 27 seconds (ref 24–36)

## 2017-08-30 NOTE — Pre-Procedure Instructions (Addendum)
Stacey Pierce  08/30/2017      [facility] [address], [place] [address] Phone: [phone] Fax: [phone]  [id number] - [place], [place] - [address] [place] [address] Phone: [phone] Fax: [phone]  [id number] - [place] [address] [place] [address] Phone: [phone] Fax: [phone]    Your procedure is scheduled on Tuesday, October 2nd   Report to [facility] Admitting at 5:30 AM             (posted surgery time 7:30a-9:47a)   Call this number if you have problems the [facility] of surgery:  [phone].   Remember:              4-5 days prior to surgery, STOP TAKING any Vitamins, Herbal Supplements, Anti-inflammatories.   Do not eat food or drink liquids after midnight Monday.   Take these medicines the morning of surgery with A SIP OF WATER : Amlodipine   Do not wear jewelry, make-up or nail polish.  Do not wear lotions, powders, perfumes, or deoderant.  Do not shave 48 hours prior to surgery.    Do not bring valuables to the hospital.  [facility] is not responsible for any belongings or valuables.  Contacts, dentures or bridgework may not be worn into surgery.  Leave your suitcase in the car.  After surgery it may be brought to your room.  For patients admitted to the hospital, discharge time will be determined by your treatment team.  Please read over the following fact sheets that you were given. Pain Booklet, MRSA Information and Surgical Site Infection Prevention          How to Manage Your Diabetes Before and After Surgery  Why is it important to control my blood sugar before and after surgery? . Improving blood sugar levels before and after surgery helps healing and can limit problems. . A way of improving blood sugar control is eating a healthy diet by: .  o  Eating less sugar and  carbohydrates o  Increasing activity/exercise o  Talking with your doctor about reaching your blood sugar goals o  . High blood sugars (greater than 180 mg/dL) can raise your risk of infections and slow your recovery, so you will need to focus on controlling your diabetes during the weeks before surgery. . Make sure that the doctor who takes care of your diabetes knows about your planned surgery including the date and location.  How do I manage my blood sugar before surgery? . Check your blood sugar at least 4 times a day, starting 2 days before surgery, to make sure that the level is not too high or low. o Check your blood sugar the morning of your surgery when you wake up and every 2 hours until you get to the Short Stay unit. o  . If your blood sugar is less than 70 mg/dL, you will need to treat for low blood sugar: o Do not take insulin. o Treat a low blood sugar (less than 70 mg/dL) with  cup of clear juice (cranberry or apple), 4 glucose tablets, OR glucose gel. o Recheck blood sugar in 15 minutes after treatment (to make sure it is greater than 70 mg/dL). If your blood sugar is not greater than 70 mg/dL on recheck, call [phone] for further instructions. o  . Report your blood sugar to  the short stay nurse when you get to Short Stay.  . If you are admitted to the hospital after surgery: o Your blood sugar will be checked by the staff and you will probably be given insulin after surgery (instead of oral diabetes medicines) to make sure you have good blood sugar levels. o The goal for blood sugar control after surgery is 80-180 mg/dL.   WHAT DO I DO ABOUT MY DIABETES MEDICATION?   Marland Kitchen Do not take oral diabetes medicines (pills) the morning of surgery.  . THE NIGHT BEFORE SURGERY, take _25 _ units of _Levemir_insulin.       Marland Kitchen HE MORNING OF SURGERY, take 10 units of __Levemir_insulin.  . The day of surgery, do not take other diabetes injectables, including Byetta (exenatide),  Bydureon (exenatide ER), Victoza (liraglutide), or Trulicity (dulaglutide).  . If your CBG is greater than 220 mg/dL, you may take  of your sliding scale (correction) dose of insulin.  Other Instructions:          Patient Signature:  Date:   Nurse Signature:  Date:   Reviewed and Endorsed by [facility] Patient Education Committee, August 2015       [facility]- Preparing For Surgery  Before surgery, you can play an important role. Because skin is not sterile, your skin needs to be as free of germs as possible. You can reduce the number of germs on your skin by washing with CHG (chlorahexidine gluconate) Soap before surgery.  CHG is an antiseptic cleaner which kills germs and bonds with the skin to continue killing germs even after washing.  Please do not use if you have an allergy to CHG or antibacterial soaps. If your skin becomes reddened/irritated stop using the CHG.  Do not shave (including legs and underarms) for at least 48 hours prior to first CHG shower. It is OK to shave your face.  Please follow these instructions carefully.   1. Shower the NIGHT BEFORE SURGERY and the MORNING OF SURGERY with CHG.   2. If you chose to wash your hair, wash your hair first as usual with your normal shampoo.  3. After you shampoo, rinse your hair and body thoroughly to remove the shampoo.  4. Use CHG as you would any other liquid soap. You can apply CHG directly to the skin and wash gently with a scrungie or a clean washcloth.   5. Apply the CHG Soap to your body ONLY FROM THE NECK DOWN.  Do not use on open wounds or open sores. Avoid contact with your eyes, ears, mouth and genitals (private parts). Wash genitals (private parts) with your normal soap.  6. Wash thoroughly, paying special attention to the area where your surgery will be performed.  7. Thoroughly rinse your body with warm water from the neck down.  8. DO NOT shower/wash with your normal soap after using and rinsing  off the CHG Soap.  9. Pat yourself dry with a CLEAN TOWEL.   10. Wear CLEAN PA[place]AS   11. Place CLEAN SHEETS on your bed the night of your first shower and DO NOT SLEEP WITH PETS.    Day of Surgery: Do not apply any deodorants/lotions. Please wear clean clothes to the hospital/surgery center.

## 2017-08-30 NOTE — Progress Notes (Signed)
PCP is Dr Rosanna Randy  LOV 04/2017 Nephrologist is Dr Juleen China LOV 04/2017.  Has some renal insufficiency. Endocrinologist is Dr. Gwynneth Macleod  LOV 07/2017 Cardio is Dr. Nehemiah Massed  LOV 08/2017.   Heart cath 07/2017 at [facility] Her am sugars run 132-165.  Her last A1C was 7.4 She has been instructed by Dr. Guy Sandifer office to stop taking metformin 9/30 & 10/1

## 2017-08-30 NOTE — Progress Notes (Signed)
I called a prescription for Mupirocin ointment to Stacey Pierce, Stacey Pierce.

## 2017-08-31 ENCOUNTER — Institutional Professional Consult (permissible substitution) (INDEPENDENT_AMBULATORY_CARE_PROVIDER_SITE_OTHER): Payer: Medicare Other | Admitting: Surgery

## 2017-08-31 ENCOUNTER — Encounter: Payer: Self-pay | Admitting: Surgery

## 2017-08-31 VITALS — BP 145/61 | HR 65 | Resp 16 | Ht 64.0 in | Wt 192.0 lb

## 2017-08-31 DIAGNOSIS — I350 Nonrheumatic aortic (valve) stenosis: Secondary | ICD-10-CM

## 2017-08-31 NOTE — Progress Notes (Signed)
Patient ID: Stacey Pierce, female   DOB: 02/05/1941, 76 y.o.   MRN: [id number]   [facility] REPORT  Referring Provider is Corey Skains, MD PCP is Jerrol Banana., MD  Chief Complaint  Patient presents with  . Follow-up    for 2nd TAVR eval for scheduled surgery 09/04/17.Marland Kitchenwill stop Metformin on Sunday and Monday    HPI:  The patient is a 76 year old woman with hypertension, type 2 diabetes, hyperlipidemia, chronic kidney disease and aortic stenosis. In 05/2015 she had asymptomatic moderate aortic stenosis with normal LV systolic function and was followed. She was seen by Dr. Kowalski in 04/2017 and an echo then showed progression to severe AS with a mean gradient of 35 mm Hg and a peak of 58 mm Hg. LVEF remained 60%. There was moderate AI and MR. She reports having slowed down over the past year with exertional shortness of breath and fatigue occurring with bending over or walking any distance. She has has some dizziness but no syncope. She denies chest pain or pressure but has had some pain in her left shoulder radiating down the left arm. She underwent cath on 08/02/2017 showing non-obstructive CAD and severe AS with a mean gradient of 32 mm Hg and normal EF.  Past Medical History:  Diagnosis Date  . Airway hyperreactivity 08/26/2015   she doesn't recall this  . Aortic stenosis   . Arthritis    degenerative arthriits in back and hips  . Diabetes mellitus (HCC)    Takes metformin  . DR (diabetic retinopathy) (HCC)    dx 34 yrs ago  . Environmental allergies   . GERD (gastroesophageal reflux disease)   . Glaucoma   . Heart murmur   . History of kidney stones    1 time about 15 yrs ago  . Hyperlipidemia   . Hypertension   . Hypothyroidism   . Long-term insulin use (HCC)   . Microalbuminuria   . Obesity   . PONV (postoperative nausea and vomiting)    a good 25 - 30 yrs ago with  band aid surgery  . Renal insufficiency    diabetic related, "starting to fail"-per patient.(6.30.16)    Past Surgical History:  Procedure Laterality Date  . CATARACT EXTRACTION     left 2010  . CATARACT EXTRACTION W/PHACO Right 10/13/2015   Procedure: CATARACT EXTRACTION PHACO AND INTRAOCULAR LENS PLACEMENT (IOC);  Surgeon: Chadwick Brasington, MD;  Location: [facility];  Service: Ophthalmology;  Laterality: Right;  DIABETIC - insulin and oral meds MALYUGIN SHUGARCAINE  . CHOLECYSTECTOMY     1967  . DILATION AND CURETTAGE OF UTERUS    . EYE SURGERY    . LAPAROSCOPIC TUBAL LIGATION    . RIGHT/LEFT HEART CATH AND CORONARY ANGIOGRAPHY N/A 08/02/2017   Procedure: RIGHT/LEFT HEART CATH AND CORONARY ANGIOGRAPHY;  Surgeon: Kowalski, Bruce J, MD;  Location: [facility];  Service: Cardiovascular;  Laterality: N/A;  . TONSILLECTOMY     [age]  . TUBAL LIGATION      Family History  Problem Relation Age of Onset  . Alzheimer's disease Mother   . Colon polyps Mother   . Hypertension Mother   . Heart attack Father   . Coronary artery disease Father   . Diabetes Father   . Breast cancer Sister   . Colon polyps Sister   . Diabetes Sister   . Diabetes Sister   .  Diabetes Sister   . Diabetes Sister     Social History   Social History  . Marital status: Married    Spouse name: N/A  . Number of children: N/A  . Years of education: N/A   Occupational History  . Not on file.   Social History Main Topics  . Smoking status: Never Smoker  . Smokeless tobacco: Never Used  . Alcohol use No  . Drug use: No  . Sexual activity: Not on file   Other Topics Concern  . Not on file   Social History Narrative  . No narrative on file    Current Outpatient Prescriptions  Medication Sig Dispense Refill  . acetaminophen (TYLENOL) 500 MG tablet Take 1,000 mg by mouth every 6 (six) hours as needed for mild pain.    Marland Kitchen amitriptyline (ELAVIL) 10 MG tablet TAKE ONE TABLET BY  MOUTH AT BEDTIME 90 tablet 3  . amLODipine (NORVASC) 5 MG tablet TAKE 1 TABLET (5 MG TOTAL) BY MOUTH DAILY. 90 tablet 3  . aspirin 81 MG tablet Take 81 mg by mouth at bedtime.     Marland Kitchen atorvastatin (LIPITOR) 40 MG tablet Take 1 tablet (40 mg total) by mouth daily. (Patient taking differently: Take 40 mg by mouth at bedtime. ) 90 tablet 3  . Blood Glucose Monitoring Suppl (FIFTY50 GLUCOSE METER 2.0) w/Device KIT Use as directed.    . carvedilol (COREG) 25 MG tablet TAKE 1 TABLET BY MOUTH 2 TIMES DAILY WITH A MEAL. 180 tablet 3  . diphenhydrAMINE (BENADRYL) 25 mg capsule Take 25 mg by mouth daily.    . dorzolamide-timolol (COSOPT) 22.3-6.8 MG/ML ophthalmic solution Place 1 drop into both eyes 2 (two) times daily.  5  . glucose blood test strip Use three test strips daily as instructed.    . insulin aspart (NOVOLOG) 100 UNIT/ML injection Inject 10-15 Units into the skin 2 (two) times daily before a meal. Takes 10 units before breakfast and 15 units before dinner.    . insulin detemir (LEVEMIR) 100 UNIT/ML injection Inject 0.18 mLs (18 Units total) into the skin 2 (two) times daily. (Patient taking differently: Inject 20-50 Units into the skin 2 (two) times daily. 20 units in the morning 50 units in the evening) 10 mL 11  . latanoprost (XALATAN) 0.005 % ophthalmic solution Place 1 drop into both eyes at bedtime.     Marland Kitchen levothyroxine (SYNTHROID, LEVOTHROID) 100 MCG tablet Take 1 tablet (100 mcg total) by mouth daily. Take early morning EMPTY stomach. 90 tablet 3  . losartan (COZAAR) 100 MG tablet TAKE 1 TABLET BY MOUTH EVERY DAY (Patient taking differently: TAKE 1 TABLET BY MOUTH AT BEDTIME) 90 tablet 3  . metFORMIN (GLUCOPHAGE) 1000 MG tablet Take 2,000 mg by mouth daily after supper.    Marland Kitchen omeprazole (PRILOSEC OTC) 20 MG tablet Take 20 mg by mouth daily.      No current facility-administered medications for this visit.     Allergies  Allergen Reactions  . Codeine Other (See Comments)    colic  .  Sulfa Antibiotics Other (See Comments)    colic  . Erythromycin Rash  . Penicillins Rash    Has patient had a PCN reaction causing immediate rash, facial/tongue/throat swelling, SOB or lightheadedness with hypotension: No Has patient had a PCN reaction causing severe rash involving mucus membranes or skin necrosis: No Has patient had a PCN reaction that required hospitalization: No Has patient had a PCN reaction occurring within the last 10 years:  No If all of the above answers are "NO", then may proceed with Cephalosporin use.       Review of Systems:   General:  normal appetite, reduced  energy, no weight gain, no weight loss, no fever  Cardiac:  no chest pain with exertion, no chest pain at rest, ha SOB with mild exertion, no resting SOB, no PND, no orthopnea, no palpitations, no arrhythmia, no atrial fibrillation, no LE edema, occasional dizzy spells, no syncope  Respiratory:  exertional shortness of breath, no home oxygen, no productive cough, no dry cough, no bronchitis, no wheezing, no hemoptysis, no asthma, no pain with inspiration or cough, no sleep apnea, no CPAP at night  GI:   no difficulty swallowing, has reflux, no frequent heartburn, no hiatal hernia, no abdominal pain, no constipation, no diarrhea, no hematochezia, no hematemesis, no melena  GU:   no dysuria,  no frequency, no urinary tract infection, no hematuria,  no kidney stones, chronic kidney disease  Vascular:  no pain suggestive of claudication, has pain in feet, no leg cramps, no varicose veins, no DVT, no non-healing foot ulcer  Neuro:   no stroke, no TIA's, no seizures, no headaches, no temporary blindness one eye,  no slurred speech, has peripheral neuropathy, no chronic pain, no instability of gait, no memory/cognitive dysfunction  Musculoskeletal: no arthritis, no joint swelling, no myalgias, no difficulty walking, normal mobility   Skin:   no rash, no itching, no skin infections, no pressure sores or  ulcerations  Psych:   no anxiety, no depression, no nervousness, no unusual recent stress  Eyes:   has blurry vision, no floaters, no recent vision changes,  wears glasses   ENT:   no hearing loss, no loose or painful teeth, no dentures, sees dentist every six months  Hematologic:  no easy bruising, no abnormal bleeding, no clotting disorder, no frequent epistaxis  Endocrine:  has diabetes, does  check CBG's at home       Physical Exam:   BP (!) 145/61 (BP Location: Right Arm, Patient Position: Sitting, Cuff Size: Large)   Pulse 65   Resp 16   Ht 5' 4"  (1.626 m)   Wt 192 lb (87.1 kg)   SpO2 94% Comment: ON RA  BMI 32.96 kg/m   General:  Elderly but  well-appearing  HEENT:  Unremarkable, NCAT, PERLA, EOMI, oropharynx clear  Neck:   no JVD, no bruits, no adenopathy or thyromegaly  Chest:   clear to auscultation, symmetrical breath sounds, no wheezes, no rhonchi   CV:   RRR, grade III/VI crescendo/decrescendo murmur heard best at RUSB,  no diastolic murmur  Abdomen:  soft, non-tender, no masses or organomegaly  Extremities:  warm, well-perfused, pulses palpable in feet, no LE edema  Rectal/GU  Deferred  Neuro:   Grossly non-focal and symmetrical throughout  Skin:   Clean and dry, no rashes, no breakdown   Diagnostic Tests:  Physicians   Panel Physicians Referring Physician Case Authorizing Physician  Corey Skains, MD (Primary)    Procedures   RIGHT/LEFT HEART CATH AND CORONARY ANGIOGRAPHY  Conclusion     Prox RCA lesion, 25 %stenosed.  Mid RCA lesion, 20 %stenosed.  Ost Cx to Prox Cx lesion, 10 %stenosed.  Ost LAD to Prox LAD lesion, 15 %stenosed.  There is severe aortic valve stenosis. There is trivial (1+) aortic regurgitation.  There is trivial (1+) aortic regurgitation.   Assessment The patient has had Acute on chronic CCS III symptoms and significant progression  of aortic stenosis  normal left ventricular function with ejection fraction of 60% with  moderate lvh  Pulmonary capillary wedge pressures with no elevation. no pulmonary hypertension  severe aortic stenosis with a valve area of 0.9 centimeters squared and a mean gradient of 32 mm Hg and peak gradient of 70m Hg  abnormal - minimal atherosclerosis of  coronary arteries with minimal 3 vessel arterial disease and up to 25% stenosis  Plan Aggressive medical management of current symptoms with appropriate use of beta blockers, ACE inhibitors, and diuretics. Consider consultation for aortic valve replacement   Indications   Aortic valve stenosis, etiology of cardiac valve disease unspecified [I35.0 (ICD-10-CM)]  Procedural Details/Technique   Technical Details Procedural details: The right groin was prepped, draped, and anesthetized with 1% lidocaine. Using modified Seldinger technique, a 6 French sheath was introduced into the right femoral artery. Standard Judkins catheters were used for coronary angiography right heart with swan ganz and laortiography. Catheter exchanges were performed over a guidewire. There were no immediate procedural complications. The patient was transferred to the post catheterization recovery area for further monitoring.  During this procedure the patient is administered a total of Versed 1 mg and Fentanyl 25 mg to achieve and maintain moderate conscious sedation. The patient's heart rate, blood pressure, and oxygen saturation are monitored continuously during the procedure. The period of conscious sedation is 33 minutes, of which I was present face-to-face 100% of this time.    Estimated blood loss <50 mL.  During this procedure the patient was administered the following to achieve and maintain moderate conscious sedation: Versed mg, while the patient's heart rate, blood pressure, and oxygen saturation were continuously monitored.    Coronary Findings   Dominance: Left  Left Anterior Descending  Ost LAD to Prox LAD lesion, 15% stenosed.  Left  Circumflex  Ost Cx to Prox Cx lesion, 10% stenosed.  Right Coronary Artery  Prox RCA lesion, 25% stenosed.  Mid RCA lesion, 20% stenosed.  Wall Motion              Left Heart   Aortic Valve There is severe aortic valve stenosis. There is trivial (1+) aortic regurgitation. The aortic valve is calcified. There is restricted aortic valve motion.    Aorta Aortic Root: The aortic root is enlarged. The aortic root displays mild dilation. There is trivial (1+) aortic regurgitation. There is a significant gradient across the aortic valve.    Coronary Diagrams   Diagnostic Diagram         CT CORONARY MORPH W/CTA COR W/SCORE W/CA W/CM &/OR WO/CM (Accession [id number] (Order [id number]  Imaging  Date: 08/13/2017 [facility] Released By: BReggy EyeAuthorizing: CSherren Mocha MD  Exam Information   Status Exam Begun  Exam Ended   Final [99] 08/13/2017 2:58 PM 08/13/2017 3:41 PM  PACS Images   Show images for CT CORONARY MORPH W/CTA COR W/SCORE W/CA W/CM &/OR WO/CM  Addendum   ADDENDUM REPORT: 08/15/2017 08:14  EXAM: OVER-READ INTERPRETATION  CT CHEST  The following report is an over-read performed by radiologist Dr. DRebekah [facility] on 08/15/2017. This over-read does not include interpretation of cardiac or coronary anatomy or pathology. The cardiac CTA interpretation by the cardiologist is attached.  COMPARISON:  Cardiac CT 06/03/2015.  FINDINGS: Relevant extracardiac findings will be described separately under dictation for contemporaneously obtained CTA of the chest, abdomen and pelvis.  IMPRESSION: Please see separate dictation for contemporaneously obtained CTA of  the chest, abdomen and pelvis 08/13/2017 for full description of relevant extracardiac findings.   Electronically Signed   By: Vinnie Langton M.D.   On: 08/15/2017 08:14   Addended by Etheleen Mayhew, MD on  08/15/2017 8:17 AM    Study Result   CLINICAL DATA:  89 -year-old female with severe aortic stenosis being evaluated for a TAVR procedure.  EXAM: Cardiac TAVR CT  TECHNIQUE: The patient was scanned on a [employer]. A 120 kV retrospective scan was triggered in the descending thoracic aorta at 111 HU's. Gantry rotation speed was 250 msecs and collimation was .6 mm. No beta blockade or nitro were given. The 3D data set was reconstructed in 5% intervals of the R-R cycle. Systolic and diastolic phases were analyzed on a dedicated work station using MPR, MIP and VRT modes. The patient received 80 cc of contrast.  FINDINGS: Aortic Valve: Trileaflet, but functionally bileaflet aortic valve with partially co-joined left and right leaflets. The leaflets are severely thickened and moderately calcified with only trivial calcifications extending into the LVOT.  Aorta: Normal size. Moderate diffuse calcifications in the aortic arch and descending aorta. No dissection.  Sinotubular Junction:  30 x 29 mm  Ascending Thoracic Aorta:  35 x 35 mm  Aortic Arch:  25 x 24 mm  Descending Thoracic Aorta:  17 x 17 mm  Sinus of Valsalva Measurements:  Non-coronary:  31 mm  Right -coronary:  27 mm  Left -coronary:  32 mm  Coronary Artery Height above Annulus:  Left Main:  14 mm  Right Coronary:  14 mm  Virtual Basal Annulus Measurements:  Maximum/Minimum Diameter:  24 x 20 mm  Perimeter:  72 mm  Area:  397 mm  Coronary Arteries:  Normal origin.  Optimum Fluoroscopic Angle for Delivery:  LAO 3 CAU 2  IMPRESSION: 1. Trileaflet, but functionally bileaflet aortic valve with partially co-joined left and right leaflets. The leaflets are severely thickened and moderately calcified with only trivial calcifications extending into the LVOT. Annular measurements suitable for delivery of a 23 mm Edwards-SAPIEN 3 valve.  2. Sufficient coronary to annular  distance.  3. Optimum Fluoroscopic Angle for Delivery: LAO 3 CAU 2  4. There is a large left atrial appendage with no evidence for a thrombus.  Ena Dawley  Electronically Signed: By: Ena Dawley On: 08/13/2017 19:50       CT Angio Abd/Pel w/ and/or w/o (Accession [id number]) (Order [id number])  Imaging  Date: 08/13/2017 Department: [facility] CT IMAGING Released By: Awilda Metro, RT Authorizing: Sherren Mocha, MD  Exam Information   Status Exam Begun  Exam Ended   Final [99] 08/13/2017 3:36 PM 08/13/2017 3:40 PM  Study Result   CLINICAL DATA:  76 year old female with history of severe aortic valve stenosis. Preprocedural study prior to potential transcatheter aortic valve replacement (TAVR) procedure.  EXAM: CT ANGIOGRAPHY CHEST, ABDOMEN AND PELVIS  TECHNIQUE: Multidetector CT imaging through the chest, abdomen and pelvis was performed using the standard protocol during bolus administration of intravenous contrast. Multiplanar reconstructed images and MIPs were obtained and reviewed to evaluate the vascular anatomy.  CONTRAST:  95 mL of Isovue 370.  COMPARISON:  Cardiac CT 06/03/2015. CT the abdomen and pelvis 03/02/2011.  FINDINGS: CTA CHEST FINDINGS  Cardiovascular: Heart size is mildly enlarged. There is no significant pericardial fluid, thickening or pericardial calcification. There is aortic atherosclerosis, as well as atherosclerosis of the great vessels of the mediastinum and the coronary arteries, including calcified  atherosclerotic plaque in the left main, left anterior descending and left circumflex coronary arteries. Severe thickening calcification of the aortic valve. Severe calcification of the mitral annulus.  Mediastinum/Lymph Nodes: No pathologically enlarged mediastinal or hilar lymph nodes. Esophagus is unremarkable in appearance. No axillary lymphadenopathy.  Lungs/Pleura: No acute  consolidative airspace disease. No pleural effusions. No suspicious appearing pulmonary nodules or masses.  Musculoskeletal/Soft Tissues: There are no aggressive appearing lytic or blastic lesions noted in the visualized portions of the skeleton.  CTA ABDOMEN AND PELVIS FINDINGS  Hepatobiliary: No cystic or solid hepatic lesions. No intra or extrahepatic biliary ductal dilatation. Status post cholecystectomy.  Pancreas: No pancreatic mass. No pancreatic ductal dilatation. No pancreatic or peripancreatic fluid or inflammatory changes.  Spleen: Unremarkable.  Adrenals/Urinary Tract: Bilateral kidneys and bilateral adrenal glands are normal in appearance. There is no hydroureteronephrosis. Urinary bladder is normal in appearance.  Stomach/Bowel: Appearance of the stomach is normal. No pathologic dilatation of small bowel or colon. The appendix is not confidently identified and may be surgically absent. Regardless, there are no inflammatory changes noted adjacent to the cecum to suggest the presence of an acute appendicitis at this time.  Vascular/Lymphatic: Aortic atherosclerosis, with vascular findings and measurements pertinent to potential TAVR procedure, as detailed below. 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 Celiac axis, superior mesenteric artery and inferior mesenteric artery and their major branches are all widely patent without hemodynamically significant stenosis. Single renal arteries are widely patent bilaterally. No lymphadenopathy noted in the abdomen or pelvis.  Reproductive: Uterus and ovaries are unremarkable in appearance.  Other: No significant volume of ascites.  No pneumoperitoneum.  Musculoskeletal: There are no aggressive appearing lytic or blastic lesions noted in the visualized portions of the skeleton.  VASCULAR MEASUREMENTS PERTINENT TO TAVR:  AORTA:  Minimal Aortic Diameter -  11 x 9  mm  Severity of Aortic Calcification -  severe  RIGHT PELVIS:  Right Common Iliac Artery -  Minimal Diameter - 8.4 x 7.0 mm  Tortuosity - mild  Calcification - mild  Right External Iliac Artery -  Minimal Diameter - 5.8 x 5.8 mm  Tortuosity - mild  Calcification - none  Right Common Femoral Artery -  Minimal Diameter - 6.7 x 4.3 mm  Tortuosity - mild  Calcification - mild to moderate  LEFT PELVIS:  Left Common Iliac Artery -  Minimal Diameter - 6.6 x 6.0 mm  Tortuosity - mild  Calcification - moderate  Left External Iliac Artery -  Minimal Diameter - 6.0 x 6.2 mm  Tortuosity - mild  Calcification - none  Left Common Femoral Artery -  Minimal Diameter - 6.5 x 6.6 mm  Tortuosity - mild  Calcification - none  Review of the MIP images confirms the above findings.  IMPRESSION: 1. Vascular findings and measurements pertinent to potential TAVR procedure, as detailed above. The patient does appear to have suitable pelvic arterial access, particularly on the left side. 2. Severe thickening calcification of the aortic valve, compatible with the reported clinical history of severe aortic stenosis. 3. Aortic atherosclerosis, in addition to left main and 2 vessel coronary artery disease. Assessment for potential risk factor modification, dietary therapy or pharmacologic therapy may be warranted, if clinically indicated. 4. Additional incidental findings, as above.   Electronically Signed   By: Trudie Reed M.D.   On: 08/15/2017 08:40    STS Score for AVR  Procedure: AV Replacement   Risk of Mortality:  4.819%  Morbidity or Mortality: 24.852%  Long Length of Stay: 12.147%  Short Length of Stay: 21.748%  Permanent Stroke: 2.487%  Prolonged Ventilation: 17.005%  DSW Infection: 0.311%  Renal Failure: 8.673%  Reoperation: 8.796%    Impression:  This 76 year old woman has stage D, severe, symptomatic aortic  stenosis with NYHA class II symptoms of exertional shortness of breath and fatigue consistent with chronic diastolic heart failure. Her echo report from Dr. Alveria Apley office showed a mean gradient across the aortic valve of 35 mm Hg with moderate AI and normal LV systolic function. I have personally reviewed the cath and CTA images. Catheterization showed a mean gradient of 32 mm Hg and a calculated valve area of 0.9 cm2. There is non-obstructive CAD. I agree that AVR is indicated in this patient. She would be a moderate risk for open surgical AVR and I think TAVR is a reasonable alternative for her. Her gated cardiac CT shows anatomy favorable for TAVR using a Sapien 3 valve. Her abdominal and pelvic CT shows vascular anatomy suitable for transfemoral insertion.   The patient and her husband were counseled at length regarding treatment alternatives for management of severe symptomatic aortic stenosis. The risks and benefits of surgical intervention has been discussed in detail. Long-term prognosis with medical therapy was discussed. Alternative approaches such as conventional surgical aortic valve replacement, transcatheter aortic valve replacement, and palliative medical therapy were compared and contrasted at length. This discussion was placed in the context of the patient's own specific clinical presentation and past medical history. All of their questions have been addressed. The patient is eager to proceed with surgical management as soon as possible.   The patient has been advised of a variety of complications that might develop including but not limited to risks of death, stroke, paravalvular leak, aortic dissection or other major vascular complications, aortic annulus rupture, device embolization, cardiac rupture or perforation, mitral regurgitation, acute myocardial infarction, arrhythmia, heart block or bradycardia requiring permanent pacemaker placement, congestive heart failure, respiratory  failure, renal failure, pneumonia, infection, other late complications related to structural valve deterioration or migration, or other complications that might ultimately cause a temporary or permanent loss of functional independence or other long term morbidity. The patient provides full informed consent for the procedure as described and all questions were answered.     Plan:  Transfemoral TAVR on 09/04/2017 by Dr. Burt Knack and Dr. Roxy Manns.   I spent 45 minutes performing this consultation and > 50% of this time was spent face to face counseling and coordinating the care of this patient's severe aortic stenosis.    Gaye Pollack, MD 08/31/2017

## 2017-09-01 ENCOUNTER — Encounter: Payer: Self-pay | Admitting: Surgery

## 2017-09-03 MED ORDER — VANCOMYCIN HCL 10 G IV SOLR
1500.0000 mg | INTRAVENOUS | Status: DC
  Filled 2017-09-03: qty 1500

## 2017-09-03 MED ORDER — MAGNESIUM SULFATE 50 % IJ SOLN
40.0000 meq | INTRAMUSCULAR | Status: DC
  Filled 2017-09-03: qty 10

## 2017-09-03 MED ORDER — SODIUM CHLORIDE 0.9 % IV SOLN
30.0000 ug/min | INTRAVENOUS | Status: DC
  Filled 2017-09-03: qty 2

## 2017-09-03 MED ORDER — SODIUM CHLORIDE 0.9 % IV SOLN: INTRAVENOUS | Status: DC

## 2017-09-03 MED ORDER — DEXMEDETOMIDINE HCL IN NACL 400 MCG/100ML IV SOLN
0.1000 ug/kg/h | INTRAVENOUS | Status: AC
  Administered 2017-09-04: 1 ug/kg/h via INTRAVENOUS
  Filled 2017-09-03: qty 100

## 2017-09-03 MED ORDER — SODIUM CHLORIDE 0.9 % IV SOLN
INTRAVENOUS | Status: DC
  Filled 2017-09-03: qty 30

## 2017-09-03 MED ORDER — POTASSIUM CHLORIDE 2 MEQ/ML IV SOLN
80.0000 meq | INTRAVENOUS | Status: DC
  Filled 2017-09-03: qty 40

## 2017-09-03 MED ORDER — EPINEPHRINE PF 1 MG/ML IJ SOLN
0.0000 ug/min | INTRAVENOUS | Status: DC
  Filled 2017-09-03: qty 4

## 2017-09-03 MED ORDER — DEXTROSE 5 % IV SOLN
1.5000 g | INTRAVENOUS | Status: AC
  Administered 2017-09-04: 1.5 g via INTRAVENOUS
  Filled 2017-09-03 (×2): qty 1.5

## 2017-09-03 MED ORDER — CHLORHEXIDINE GLUCONATE 0.12 % MT SOLN
15.0000 mL | Freq: Once | OROMUCOSAL | Status: AC
  Administered 2017-09-04: 15 mL via OROMUCOSAL
  Filled 2017-09-03: qty 15

## 2017-09-03 MED ORDER — NITROGLYCERIN IN D5W 200-5 MCG/ML-% IV SOLN
2.0000 ug/min | INTRAVENOUS | Status: DC
  Filled 2017-09-03: qty 250

## 2017-09-03 MED ORDER — NOREPINEPHRINE BITARTRATE 1 MG/ML IV SOLN
0.0000 ug/min | INTRAVENOUS | Status: DC
  Filled 2017-09-03: qty 4

## 2017-09-03 MED ORDER — SODIUM CHLORIDE 0.9 % IV SOLN
INTRAVENOUS | Status: DC
  Filled 2017-09-03: qty 1

## 2017-09-03 MED ORDER — DOPAMINE-DEXTROSE 3.2-5 MG/ML-% IV SOLN
0.0000 ug/kg/min | INTRAVENOUS | Status: DC
  Filled 2017-09-03: qty 250

## 2017-09-04 ENCOUNTER — Inpatient Hospital Stay (HOSPITAL_COMMUNITY): Payer: Medicare Other

## 2017-09-04 ENCOUNTER — Encounter (HOSPITAL_COMMUNITY): Payer: Self-pay | Admitting: *Deleted

## 2017-09-04 ENCOUNTER — Inpatient Hospital Stay (HOSPITAL_COMMUNITY): Payer: Medicare Other | Admitting: Certified Registered"

## 2017-09-04 ENCOUNTER — Encounter (HOSPITAL_COMMUNITY)
Admission: RE | Disposition: A | Discharge status: Home / Self Care | Payer: Self-pay | Source: Ambulatory Visit | Attending: Thoracic Surgery (Cardiothoracic Vascular Surgery)

## 2017-09-04 ENCOUNTER — Inpatient Hospital Stay (HOSPITAL_COMMUNITY)
Admission: RE | Admit: 2017-09-04 | Discharge: 2017-09-06 | DRG: 267 | Disposition: A | Discharge status: Home / Self Care | Payer: Medicare Other | Source: Ambulatory Visit | Attending: Thoracic Surgery (Cardiothoracic Vascular Surgery) | Admitting: Thoracic Surgery (Cardiothoracic Vascular Surgery)

## 2017-09-04 DIAGNOSIS — I10 Essential (primary) hypertension: Secondary | ICD-10-CM

## 2017-09-04 DIAGNOSIS — E669 Obesity, unspecified: Secondary | ICD-10-CM | POA: Diagnosis present

## 2017-09-04 DIAGNOSIS — I350 Nonrheumatic aortic (valve) stenosis: Secondary | ICD-10-CM | POA: Diagnosis not present

## 2017-09-04 DIAGNOSIS — I447 Left bundle-branch block, unspecified: Secondary | ICD-10-CM | POA: Diagnosis present

## 2017-09-04 DIAGNOSIS — G8929 Other chronic pain: Secondary | ICD-10-CM | POA: Diagnosis present

## 2017-09-04 DIAGNOSIS — N183 Chronic kidney disease, stage 3 unspecified: Secondary | ICD-10-CM | POA: Diagnosis present

## 2017-09-04 DIAGNOSIS — E119 Type 2 diabetes mellitus without complications: Secondary | ICD-10-CM

## 2017-09-04 DIAGNOSIS — Z794 Long term (current) use of insulin: Secondary | ICD-10-CM | POA: Diagnosis not present

## 2017-09-04 DIAGNOSIS — Z7982 Long term (current) use of aspirin: Secondary | ICD-10-CM | POA: Diagnosis not present

## 2017-09-04 DIAGNOSIS — E039 Hypothyroidism, unspecified: Secondary | ICD-10-CM | POA: Diagnosis not present

## 2017-09-04 DIAGNOSIS — Z006 Encounter for examination for normal comparison and control in clinical research program: Secondary | ICD-10-CM | POA: Diagnosis not present

## 2017-09-04 DIAGNOSIS — I129 Hypertensive chronic kidney disease with stage 1 through stage 4 chronic kidney disease, or unspecified chronic kidney disease: Secondary | ICD-10-CM | POA: Diagnosis present

## 2017-09-04 DIAGNOSIS — Z23 Encounter for immunization: Secondary | ICD-10-CM

## 2017-09-04 DIAGNOSIS — I083 Combined rheumatic disorders of mitral, aortic and tricuspid valves: Secondary | ICD-10-CM | POA: Diagnosis not present

## 2017-09-04 DIAGNOSIS — I2510 Atherosclerotic heart disease of native coronary artery without angina pectoris: Secondary | ICD-10-CM | POA: Diagnosis present

## 2017-09-04 DIAGNOSIS — Z7902 Long term (current) use of antithrombotics/antiplatelets: Secondary | ICD-10-CM

## 2017-09-04 DIAGNOSIS — K219 Gastro-esophageal reflux disease without esophagitis: Secondary | ICD-10-CM | POA: Diagnosis present

## 2017-09-04 DIAGNOSIS — J9811 Atelectasis: Secondary | ICD-10-CM

## 2017-09-04 DIAGNOSIS — E1122 Type 2 diabetes mellitus with diabetic chronic kidney disease: Secondary | ICD-10-CM | POA: Diagnosis not present

## 2017-09-04 DIAGNOSIS — E785 Hyperlipidemia, unspecified: Secondary | ICD-10-CM | POA: Diagnosis present

## 2017-09-04 DIAGNOSIS — Z952 Presence of prosthetic heart valve: Secondary | ICD-10-CM | POA: Diagnosis not present

## 2017-09-04 DIAGNOSIS — M549 Dorsalgia, unspecified: Secondary | ICD-10-CM

## 2017-09-04 DIAGNOSIS — Z6831 Body mass index (BMI) 31.0-31.9, adult: Secondary | ICD-10-CM | POA: Diagnosis not present

## 2017-09-04 DIAGNOSIS — Z6832 Body mass index (BMI) 32.0-32.9, adult: Secondary | ICD-10-CM | POA: Diagnosis not present

## 2017-09-04 HISTORY — DX: Type 2 diabetes mellitus with hyperglycemia: E11.65

## 2017-09-04 HISTORY — DX: Long term (current) use of insulin: Z79.4

## 2017-09-04 HISTORY — PX: TRANSCATHETER AORTIC VALVE REPLACEMENT, TRANSFEMORAL: SHX6400

## 2017-09-04 HISTORY — DX: Chronic kidney disease, unspecified: N18.9

## 2017-09-04 HISTORY — DX: Presence of prosthetic heart valve: Z95.2

## 2017-09-04 HISTORY — DX: Nonrheumatic aortic (valve) stenosis: I35.0

## 2017-09-04 HISTORY — PX: TEE WITHOUT CARDIOVERSION: SHX5443

## 2017-09-04 HISTORY — DX: Reserved for inherently not codable concepts without codable children: IMO0001

## 2017-09-04 LAB — POCT I-STAT, CHEM 8
BUN: 22 mg/dL — ABNORMAL HIGH (ref 6–20)
BUN: 22 mg/dL — AB (ref 6–20)
CALCIUM ION: 1.29 mmol/L (ref 1.15–1.40)
CALCIUM ION: 1.34 mmol/L (ref 1.15–1.40)
CHLORIDE: 107 mmol/L (ref 101–111)
CHLORIDE: 106 mmol/L (ref 101–111)
CREATININE: 1.3 mg/dL — AB (ref 0.44–1.00)
Creatinine, Ser: 1.2 mg/dL — ABNORMAL HIGH (ref 0.44–1.00)
GLUCOSE: 202 mg/dL — AB (ref 65–99)
GLUCOSE: 244 mg/dL — AB (ref 65–99)
HCT: 29 % — ABNORMAL LOW (ref 36.0–46.0)
HCT: 26 % — ABNORMAL LOW (ref 36.0–46.0)
HEMOGLOBIN: 9.9 g/dL — AB (ref 12.0–15.0)
Hemoglobin: 8.8 g/dL — ABNORMAL LOW (ref 12.0–15.0)
POTASSIUM: 4.8 mmol/L (ref 3.5–5.1)
Potassium: 4.9 mmol/L (ref 3.5–5.1)
SODIUM: 139 mmol/L (ref 135–145)
Sodium: 141 mmol/L (ref 135–145)
TCO2: 22 mmol/L (ref 22–32)
TCO2: 23 mmol/L (ref 22–32)

## 2017-09-04 LAB — POCT I-STAT 3, ART BLOOD GAS (G3+)
ACID-BASE DEFICIT: 4 mmol/L — AB (ref 0.0–2.0)
Bicarbonate: 21.4 mmol/L (ref 20.0–28.0)
O2 SAT: 95 %
PO2 ART: 78 mmHg — AB (ref 83.0–108.0)
TCO2: 23 mmol/L (ref 22–32)
pCO2 arterial: 39.6 mmHg (ref 32.0–48.0)
pH, Arterial: 7.341 — ABNORMAL LOW (ref 7.350–7.450)

## 2017-09-04 LAB — GLUCOSE, CAPILLARY
GLUCOSE-CAPILLARY: 195 mg/dL — AB (ref 65–99)
Glucose-Capillary: 191 mg/dL — ABNORMAL HIGH (ref 65–99)
Glucose-Capillary: 211 mg/dL — ABNORMAL HIGH (ref 65–99)
Glucose-Capillary: 277 mg/dL — ABNORMAL HIGH (ref 65–99)

## 2017-09-04 SURGERY — IMPLANTATION, AORTIC VALVE, TRANSCATHETER, FEMORAL APPROACH
Anesthesia: Monitor Anesthesia Care | Site: Chest

## 2017-09-04 MED ORDER — LEVOFLOXACIN IN D5W 750 MG/150ML IV SOLN
750.0000 mg | INTRAVENOUS | Status: AC
  Administered 2017-09-05: 750 mg via INTRAVENOUS
  Filled 2017-09-04: qty 150

## 2017-09-04 MED ORDER — FENTANYL CITRATE (PF) 250 MCG/5ML IJ SOLN
INTRAMUSCULAR | Status: AC
  Filled 2017-09-04: qty 5

## 2017-09-04 MED ORDER — ROCURONIUM BROMIDE 10 MG/ML (PF) SYRINGE
PREFILLED_SYRINGE | INTRAVENOUS | Status: AC
  Filled 2017-09-04: qty 5

## 2017-09-04 MED ORDER — POTASSIUM CHLORIDE 10 MEQ/50ML IV SOLN: 10.0000 meq | INTRAVENOUS | Status: AC

## 2017-09-04 MED ORDER — MORPHINE SULFATE (PF) 4 MG/ML IV SOLN: 1.0000 mg | INTRAVENOUS | Status: DC | PRN

## 2017-09-04 MED ORDER — INSULIN REGULAR BOLUS VIA INFUSION
0.0000 [IU] | INTRAVENOUS | Status: DC
  Filled 2017-09-04: qty 10

## 2017-09-04 MED ORDER — IODIXANOL 320 MG/ML IV SOLN
INTRAVENOUS | Status: DC | PRN
  Administered 2017-09-04: 60.2 mL via INTRA_ARTERIAL

## 2017-09-04 MED ORDER — ALBUMIN HUMAN 5 % IV SOLN: 250.0000 mL | INTRAVENOUS | Status: DC | PRN

## 2017-09-04 MED ORDER — CHLORHEXIDINE GLUCONATE 4 % EX LIQD: 60.0000 mL | Freq: Once | CUTANEOUS | Status: DC

## 2017-09-04 MED ORDER — DEXMEDETOMIDINE HCL 200 MCG/2ML IV SOLN
INTRAVENOUS | Status: DC | PRN
  Administered 2017-09-04: 87.1 ug via INTRAVENOUS

## 2017-09-04 MED ORDER — PROPOFOL 10 MG/ML IV BOLUS
INTRAVENOUS | Status: AC
  Filled 2017-09-04: qty 20

## 2017-09-04 MED ORDER — ASPIRIN EC 325 MG PO TBEC: 325.0000 mg | DELAYED_RELEASE_TABLET | Freq: Every day | ORAL | Status: DC

## 2017-09-04 MED ORDER — TRAMADOL HCL 50 MG PO TABS: 50.0000 mg | ORAL_TABLET | ORAL | Status: DC | PRN

## 2017-09-04 MED ORDER — INSULIN DETEMIR 100 UNIT/ML ~~LOC~~ SOLN
18.0000 [IU] | Freq: Two times a day (BID) | SUBCUTANEOUS | Status: DC
  Administered 2017-09-04 – 2017-09-06 (×4): 18 [IU] via SUBCUTANEOUS
  Filled 2017-09-04 (×5): qty 0.18

## 2017-09-04 MED ORDER — SUCCINYLCHOLINE CHLORIDE 200 MG/10ML IV SOSY
PREFILLED_SYRINGE | INTRAVENOUS | Status: AC
  Filled 2017-09-04: qty 10

## 2017-09-04 MED ORDER — INFLUENZA VAC SPLIT HIGH-DOSE 0.5 ML IM SUSY
0.5000 mL | PREFILLED_SYRINGE | INTRAMUSCULAR | Status: AC
  Administered 2017-09-05: 0.5 mL via INTRAMUSCULAR
  Filled 2017-09-04 (×2): qty 0.5

## 2017-09-04 MED ORDER — CHLORHEXIDINE GLUCONATE 4 % EX LIQD: 30.0000 mL | CUTANEOUS | Status: DC

## 2017-09-04 MED ORDER — ONDANSETRON HCL 4 MG/2ML IJ SOLN: 4.0000 mg | Freq: Four times a day (QID) | INTRAMUSCULAR | Status: DC | PRN

## 2017-09-04 MED ORDER — ARTIFICIAL TEARS OPHTHALMIC OINT
TOPICAL_OINTMENT | OPHTHALMIC | Status: AC
  Filled 2017-09-04: qty 3.5

## 2017-09-04 MED ORDER — HEPARIN SODIUM (PORCINE) 1000 UNIT/ML IJ SOLN
INTRAMUSCULAR | Status: DC | PRN
  Administered 2017-09-04: 11000 [IU] via INTRAVENOUS

## 2017-09-04 MED ORDER — SODIUM CHLORIDE 0.9 % IV SOLN
0.0000 ug/min | INTRAVENOUS | Status: DC
  Filled 2017-09-04: qty 2

## 2017-09-04 MED ORDER — PROPOFOL 10 MG/ML IV BOLUS
INTRAVENOUS | Status: DC | PRN
  Administered 2017-09-04: 20 mg via INTRAVENOUS

## 2017-09-04 MED ORDER — INSULIN ASPART 100 UNIT/ML ~~LOC~~ SOLN
0.0000 [IU] | Freq: Three times a day (TID) | SUBCUTANEOUS | Status: DC
  Administered 2017-09-04 – 2017-09-05 (×2): 12 [IU] via SUBCUTANEOUS
  Administered 2017-09-05: 8 [IU] via SUBCUTANEOUS
  Administered 2017-09-05: 4 [IU] via SUBCUTANEOUS
  Administered 2017-09-06: 8 [IU] via SUBCUTANEOUS

## 2017-09-04 MED ORDER — DEXAMETHASONE SODIUM PHOSPHATE 10 MG/ML IJ SOLN
INTRAMUSCULAR | Status: AC
  Filled 2017-09-04: qty 1

## 2017-09-04 MED ORDER — DEXAMETHASONE SODIUM PHOSPHATE 10 MG/ML IJ SOLN
INTRAMUSCULAR | Status: DC | PRN
  Administered 2017-09-04: 5 mg via INTRAVENOUS

## 2017-09-04 MED ORDER — HEPARIN SODIUM (PORCINE) 1000 UNIT/ML IJ SOLN
INTRAMUSCULAR | Status: AC
  Filled 2017-09-04: qty 2

## 2017-09-04 MED ORDER — ONDANSETRON HCL 4 MG/2ML IJ SOLN
INTRAMUSCULAR | Status: AC
  Filled 2017-09-04: qty 2

## 2017-09-04 MED ORDER — PHENYLEPHRINE 40 MCG/ML (10ML) SYRINGE FOR IV PUSH (FOR BLOOD PRESSURE SUPPORT)
PREFILLED_SYRINGE | INTRAVENOUS | Status: AC
  Filled 2017-09-04: qty 10

## 2017-09-04 MED ORDER — HEPARIN SODIUM (PORCINE) 1000 UNIT/ML IJ SOLN
INTRAMUSCULAR | Status: AC
  Filled 2017-09-04: qty 1

## 2017-09-04 MED ORDER — LACTATED RINGERS IV SOLN
INTRAVENOUS | Status: DC | PRN
  Administered 2017-09-04 (×2): via INTRAVENOUS

## 2017-09-04 MED ORDER — VANCOMYCIN HCL IN DEXTROSE 1-5 GM/200ML-% IV SOLN
1000.0000 mg | Freq: Once | INTRAVENOUS | Status: AC
  Administered 2017-09-04: 1000 mg via INTRAVENOUS
  Filled 2017-09-04: qty 200

## 2017-09-04 MED ORDER — PROTAMINE SULFATE 10 MG/ML IV SOLN
INTRAVENOUS | Status: AC
  Filled 2017-09-04: qty 25

## 2017-09-04 MED ORDER — 0.9 % SODIUM CHLORIDE (POUR BTL) OPTIME
TOPICAL | Status: DC | PRN
  Administered 2017-09-04: 4000 mL

## 2017-09-04 MED ORDER — PROTAMINE SULFATE 10 MG/ML IV SOLN
INTRAVENOUS | Status: DC | PRN
  Administered 2017-09-04: 10 mg via INTRAVENOUS
  Administered 2017-09-04: 100 mg via INTRAVENOUS

## 2017-09-04 MED ORDER — MORPHINE SULFATE (PF) 2 MG/ML IV SOLN: 1.0000 mg | INTRAVENOUS | Status: DC | PRN

## 2017-09-04 MED ORDER — SODIUM CHLORIDE 0.9 % IV SOLN
INTRAVENOUS | Status: DC | PRN
  Administered 2017-09-04: 1500 mL

## 2017-09-04 MED ORDER — PROPOFOL 1000 MG/100ML IV EMUL
INTRAVENOUS | Status: AC
  Filled 2017-09-04: qty 100

## 2017-09-04 MED ORDER — LACTATED RINGERS IV SOLN: 500.0000 mL | Freq: Once | INTRAVENOUS | Status: DC | PRN

## 2017-09-04 MED ORDER — MIDAZOLAM HCL 2 MG/2ML IJ SOLN
INTRAMUSCULAR | Status: AC
  Filled 2017-09-04: qty 2

## 2017-09-04 MED ORDER — LATANOPROST 0.005 % OP SOLN
1.0000 [drp] | Freq: Every day | OPHTHALMIC | Status: DC
  Administered 2017-09-04 – 2017-09-05 (×2): 1 [drp] via OPHTHALMIC
  Filled 2017-09-04: qty 2.5

## 2017-09-04 MED ORDER — MIDAZOLAM HCL 2 MG/2ML IJ SOLN: 2.0000 mg | INTRAMUSCULAR | Status: DC | PRN

## 2017-09-04 MED ORDER — LIDOCAINE HCL 1 % IJ SOLN
INTRAMUSCULAR | Status: DC | PRN
  Administered 2017-09-04: 4 mL

## 2017-09-04 MED ORDER — NITROGLYCERIN IN D5W 200-5 MCG/ML-% IV SOLN: 0.0000 ug/min | INTRAVENOUS | Status: DC

## 2017-09-04 MED ORDER — CLOPIDOGREL BISULFATE 75 MG PO TABS
75.0000 mg | ORAL_TABLET | Freq: Every day | ORAL | Status: DC
  Administered 2017-09-05 – 2017-09-06 (×2): 75 mg via ORAL
  Filled 2017-09-04 (×2): qty 1

## 2017-09-04 MED ORDER — ONDANSETRON HCL 4 MG/2ML IJ SOLN
INTRAMUSCULAR | Status: DC | PRN
  Administered 2017-09-04: 4 mg via INTRAVENOUS

## 2017-09-04 MED ORDER — FENTANYL CITRATE (PF) 250 MCG/5ML IJ SOLN
INTRAMUSCULAR | Status: DC | PRN
  Administered 2017-09-04 (×2): 50 ug via INTRAVENOUS

## 2017-09-04 MED ORDER — DORZOLAMIDE HCL-TIMOLOL MAL 2-0.5 % OP SOLN
1.0000 [drp] | Freq: Two times a day (BID) | OPHTHALMIC | Status: DC
  Administered 2017-09-04 – 2017-09-06 (×4): 1 [drp] via OPHTHALMIC
  Filled 2017-09-04: qty 10

## 2017-09-04 MED ORDER — PROPOFOL 500 MG/50ML IV EMUL
INTRAVENOUS | Status: DC | PRN
  Administered 2017-09-04: 50 ug/kg/min via INTRAVENOUS

## 2017-09-04 MED ORDER — SODIUM CHLORIDE 0.9 % IV SOLN: 1.0000 mL/kg/h | INTRAVENOUS | Status: AC

## 2017-09-04 MED ORDER — SODIUM CHLORIDE 0.9 % IV SOLN
1.0000 mL/kg/h | INTRAVENOUS | Status: AC
  Administered 2017-09-04: 1 mL/kg/h via INTRAVENOUS

## 2017-09-04 MED ORDER — ASPIRIN 81 MG PO CHEW: 324.0000 mg | CHEWABLE_TABLET | Freq: Every day | ORAL | Status: DC

## 2017-09-04 MED ORDER — MIDAZOLAM HCL 2 MG/2ML IJ SOLN
INTRAMUSCULAR | Status: DC | PRN
  Administered 2017-09-04 (×2): 1 mg via INTRAVENOUS

## 2017-09-04 MED ORDER — LIDOCAINE 2% (20 MG/ML) 5 ML SYRINGE
INTRAMUSCULAR | Status: AC
  Filled 2017-09-04: qty 5

## 2017-09-04 MED ORDER — INSULIN ASPART 100 UNIT/ML ~~LOC~~ SOLN
0.0000 [IU] | SUBCUTANEOUS | Status: DC
  Administered 2017-09-04: 4 [IU] via SUBCUTANEOUS
  Administered 2017-09-04: 8 [IU] via SUBCUTANEOUS

## 2017-09-04 MED ORDER — FAMOTIDINE IN NACL 20-0.9 MG/50ML-% IV SOLN: 20.0000 mg | Freq: Two times a day (BID) | INTRAVENOUS | Status: DC

## 2017-09-04 MED ORDER — LIDOCAINE HCL 1 % IJ SOLN
INTRAMUSCULAR | Status: AC
  Filled 2017-09-04: qty 20

## 2017-09-04 MED ORDER — CHLORHEXIDINE GLUCONATE 0.12 % MT SOLN
15.0000 mL | OROMUCOSAL | Status: AC
  Administered 2017-09-04: 15 mL via OROMUCOSAL
  Filled 2017-09-04: qty 15

## 2017-09-04 MED ORDER — OXYCODONE HCL 5 MG PO TABS
5.0000 mg | ORAL_TABLET | ORAL | Status: DC | PRN
  Administered 2017-09-04: 5 mg via ORAL
  Filled 2017-09-04: qty 1

## 2017-09-04 MED ORDER — METOPROLOL TARTRATE 5 MG/5ML IV SOLN
2.5000 mg | INTRAVENOUS | Status: DC | PRN
  Administered 2017-09-05: 5 mg via INTRAVENOUS
  Filled 2017-09-04: qty 5

## 2017-09-04 SURGICAL SUPPLY — 113 items
ADAPTER UNIV SWAN GANZ BIP (ADAPTER) ×2 IMPLANT
ADAPTER UNV SWAN GANZ BIP (ADAPTER) ×1
ATTRACTOMAT 16X20 MAGNETIC DRP (DRAPES) IMPLANT
BAG BANDED W/RUBBER/TAPE 36X54 (MISCELLANEOUS) ×3 IMPLANT
BAG DECANTER FOR FLEXI CONT (MISCELLANEOUS) IMPLANT
BAG SNAP BAND KOVER 36X36 (MISCELLANEOUS) ×6 IMPLANT
BLADE 10 SAFETY STRL DISP (BLADE) ×3 IMPLANT
BLADE CLIPPER SURG (BLADE) IMPLANT
BLADE OSCILLATING /SAGITTAL (BLADE) IMPLANT
BLADE STERNUM SYSTEM 6 (BLADE) ×3 IMPLANT
CABLE ADAPT CONN TEMP 6FT (ADAPTER) ×3 IMPLANT
CABLE PACING FASLOC BIEGE (MISCELLANEOUS) ×3 IMPLANT
CABLE PACING FASLOC BLUE (MISCELLANEOUS) ×3 IMPLANT
CANISTER SUCT 3000ML PPV (MISCELLANEOUS) IMPLANT
CANNULA FEM VENOUS REMOTE 22FR (CANNULA) IMPLANT
CANNULA OPTISITE PERFUSION 16F (CANNULA) IMPLANT
CANNULA OPTISITE PERFUSION 18F (CANNULA) IMPLANT
CATH DIAG EXPO 6F VENT PIG 145 (CATHETERS) ×6 IMPLANT
CATH EXPO 5FR AL1 (CATHETERS) ×3 IMPLANT
CATH FELDMAN FA1 6F 100CM (CATHETERS) ×3 IMPLANT
CATH S G BIP PACING (SET/KITS/TRAYS/PACK) ×3 IMPLANT
CLIP VESOCCLUDE MED 24/CT (CLIP) ×3 IMPLANT
CLIP VESOCCLUDE SM WIDE 24/CT (CLIP) ×3 IMPLANT
CONT SPEC 4OZ CLIKSEAL STRL BL (MISCELLANEOUS) ×9 IMPLANT
COVER BACK TABLE 24X17X13 BIG (DRAPES) ×3 IMPLANT
COVER BACK TABLE 60X90IN (DRAPES) ×3 IMPLANT
COVER BACK TABLE 80X110 HD (DRAPES) ×3 IMPLANT
COVER DOME SNAP 22 D (MISCELLANEOUS) ×3 IMPLANT
COVER MAYO STAND STRL (DRAPES) ×3 IMPLANT
CRADLE DONUT ADULT HEAD (MISCELLANEOUS) ×3 IMPLANT
DERMABOND ADVANCED (GAUZE/BANDAGES/DRESSINGS) ×1
DERMABOND ADVANCED .7 DNX12 (GAUZE/BANDAGES/DRESSINGS) ×2 IMPLANT
DEVICE CLOSURE PERCLS PRGLD 6F (VASCULAR PRODUCTS) ×4 IMPLANT
DRAPE INCISE IOBAN 66X45 STRL (DRAPES) IMPLANT
DRAPE SLUSH MACHINE 52X66 (DRAPES) ×3 IMPLANT
DRSG TEGADERM 4X4.75 (GAUZE/BANDAGES/DRESSINGS) ×3 IMPLANT
DRYSEAL FLEXSHEATH 16FR 33CM (SHEATH) ×1
DRYSEAL FLEXSHEATH 20FR 33CM (SHEATH) ×3 IMPLANT
ELECT REM PT RETURN 9FT ADLT (ELECTROSURGICAL) ×6
ELECTRODE REM PT RTRN 9FT ADLT (ELECTROSURGICAL) ×4 IMPLANT
FELT TEFLON 6X6 (MISCELLANEOUS) ×3 IMPLANT
FEMORAL VENOUS CANN RAP (CANNULA) IMPLANT
GAUZE SPONGE 4X4 12PLY STRL (GAUZE/BANDAGES/DRESSINGS) ×3 IMPLANT
GAUZE SPONGE 4X4 12PLY STRL LF (GAUZE/BANDAGES/DRESSINGS) ×3 IMPLANT
GLOVE BIO SURGEON STRL SZ7.5 (GLOVE) IMPLANT
GLOVE BIO SURGEON STRL SZ8 (GLOVE) ×3 IMPLANT
GLOVE EUDERMIC 7 POWDERFREE (GLOVE) ×6 IMPLANT
GLOVE ORTHO TXT STRL SZ7.5 (GLOVE) ×3 IMPLANT
GOWN STRL REUS W/ TWL LRG LVL3 (GOWN DISPOSABLE) ×6 IMPLANT
GOWN STRL REUS W/ TWL XL LVL3 (GOWN DISPOSABLE) ×12 IMPLANT
GOWN STRL REUS W/TWL LRG LVL3 (GOWN DISPOSABLE) ×3
GOWN STRL REUS W/TWL XL LVL3 (GOWN DISPOSABLE) ×6
GUIDEWIRE CNFDA BRKR CVD (WIRE) ×3 IMPLANT
GUIDEWIRE SAF TJ AMPL .035X180 (WIRE) ×3 IMPLANT
GUIDEWIRE SAFE TJ AMPLATZ EXST (WIRE) ×3 IMPLANT
GUIDEWIRE STRAIGHT .035 260CM (WIRE) ×3 IMPLANT
INSERT FOGARTY 61MM (MISCELLANEOUS) ×3 IMPLANT
INSERT FOGARTY SM (MISCELLANEOUS) ×6 IMPLANT
INSERT FOGARTY XLG (MISCELLANEOUS) IMPLANT
KIT BASIN OR (CUSTOM PROCEDURE TRAY) ×3 IMPLANT
KIT DILATOR VASC 18G NDL (KITS) ×3 IMPLANT
KIT HEART LEFT (KITS) ×3 IMPLANT
KIT ROOM TURNOVER OR (KITS) ×3 IMPLANT
KIT SUCTION CATH 14FR (SUCTIONS) ×6 IMPLANT
NEEDLE PERC 18GX7CM (NEEDLE) ×3 IMPLANT
NS IRRIG 1000ML POUR BTL (IV SOLUTION) ×15 IMPLANT
PACK AORTA (CUSTOM PROCEDURE TRAY) ×3 IMPLANT
PAD ARMBOARD 7.5X6 YLW CONV (MISCELLANEOUS) ×6 IMPLANT
PAD ELECT DEFIB RADIOL ZOLL (MISCELLANEOUS) ×3 IMPLANT
PATCH TACHOSII LRG 9.5X4.8 (VASCULAR PRODUCTS) IMPLANT
PERCLOSE PROGLIDE 6F (VASCULAR PRODUCTS) ×6
SET MICROPUNCTURE 5F STIFF (MISCELLANEOUS) ×3 IMPLANT
SHEATH AVANTI 11CM 8FR (MISCELLANEOUS) ×3 IMPLANT
SHEATH DRYSEAL FLEX 16FR 33CM (SHEATH) ×2 IMPLANT
SHEATH PINNACLE 6F 10CM (SHEATH) ×6 IMPLANT
SLEEVE REPOSITIONING LENGTH 30 (MISCELLANEOUS) ×3 IMPLANT
SPONGE LAP 4X18 X RAY DECT (DISPOSABLE) IMPLANT
STOPCOCK MORSE 400PSI 3WAY (MISCELLANEOUS) ×18 IMPLANT
SUT ETHIBOND X763 2 0 SH 1 (SUTURE) ×3 IMPLANT
SUT GORETEX CV 4 TH 22 36 (SUTURE) ×3 IMPLANT
SUT GORETEX CV4 TH-18 (SUTURE) ×9 IMPLANT
SUT GORETEX TH-18 36 INCH (SUTURE) ×6 IMPLANT
SUT MNCRL AB 3-0 PS2 18 (SUTURE) ×3 IMPLANT
SUT PROLENE 3 0 SH1 36 (SUTURE) IMPLANT
SUT PROLENE 4 0 RB 1 (SUTURE)
SUT PROLENE 4-0 RB1 .5 CRCL 36 (SUTURE) IMPLANT
SUT PROLENE 5 0 C 1 36 (SUTURE) IMPLANT
SUT PROLENE 6 0 C 1 30 (SUTURE) IMPLANT
SUT SILK  1 MH (SUTURE) ×1
SUT SILK 1 MH (SUTURE) ×2 IMPLANT
SUT SILK 2 0 SH CR/8 (SUTURE) IMPLANT
SUT VIC AB 2-0 CT1 27 (SUTURE)
SUT VIC AB 2-0 CT1 TAPERPNT 27 (SUTURE) IMPLANT
SUT VIC AB 2-0 CTX 36 (SUTURE) IMPLANT
SUT VIC AB 3-0 SH 8-18 (SUTURE) IMPLANT
SYR 10ML LL (SYRINGE) ×9 IMPLANT
SYR 30ML LL (SYRINGE) ×6 IMPLANT
SYR 50ML LL SCALE MARK (SYRINGE) ×3 IMPLANT
SYS DELIVERY ENVEO PRO 16FR (MISCELLANEOUS) ×3
SYS LOAD ENVEOPRO 26/29MMX34MM (MISCELLANEOUS) ×3
SYSTEM DELIVERY ENVEO PRO 16FR (MISCELLANEOUS) ×2 IMPLANT
SYSTEM LOAD ENVPR 26/29MMX34MM (MISCELLANEOUS) ×2 IMPLANT
TAPE CLOTH SURG 4X10 WHT LF (GAUZE/BANDAGES/DRESSINGS) ×3 IMPLANT
TOWEL OR 17X26 10 PK STRL BLUE (TOWEL DISPOSABLE) ×6 IMPLANT
TRANSDUCER W/STOPCOCK (MISCELLANEOUS) ×6 IMPLANT
TRAY FOLEY SILVER 14FR TEMP (SET/KITS/TRAYS/PACK) IMPLANT
TRAY FOLEY SILVER 16FR TEMP (SET/KITS/TRAYS/PACK) IMPLANT
TUBE SUCT INTRACARD DLP 20F (MISCELLANEOUS) IMPLANT
TUBING HIGH PRESSURE 120CM (CONNECTOR) ×3 IMPLANT
VALVE AORTIC EVOLUT R 26MM (Prosthesis & Implant Heart) ×3 IMPLANT
WIRE AMPLATZ SS-J .035X180CM (WIRE) ×3 IMPLANT
WIRE BENTSON .035X145CM (WIRE) ×3 IMPLANT
WIRE MINI STICK MAX (SHEATH) IMPLANT

## 2017-09-04 NOTE — Progress Notes (Signed)
  [facility]   MULTIDISCIPLINARY HEART VALVE TEAM  Patient drowsy but alert. No complaints. Groin sites are stable. Tele sinus bradycardia with HR in high 40s and 50s but no high grade block. She is hemodynamically stable. Will discontinue arterial line.    Angelena Form PA-C  [facility]

## 2017-09-04 NOTE — Interval H&P Note (Signed)
History and Physical Interval Note:  09/04/2017 6:04 AM  Stacey Pierce  has presented today for surgery, with the diagnosis of severe aortic stenosis  The various methods of treatment have been discussed with the patient and family. After consideration of risks, benefits and other options for treatment, the patient has consented to  Procedure(s): TRANSCATHETER AORTIC VALVE REPLACEMENT, TRANSFEMORAL (N/A) TRANSESOPHAGEAL ECHOCARDIOGRAM (TEE) (N/A) as a surgical intervention .  The patient's history has been reviewed, patient examined, no change in status, stable for surgery.  I have reviewed the patient's chart and labs.  Questions were answered to the patient's satisfaction.     Rexene Alberts

## 2017-09-04 NOTE — Transfer of Care (Signed)
Immediate Anesthesia Transfer of Care Note  Patient: Stacey Pierce  Procedure(s) Performed: TRANSCATHETER AORTIC VALVE REPLACEMENT, TRANSFEMORAL (N/A Chest) TRANSESOPHAGEAL ECHOCARDIOGRAM (TEE) (N/A )  Patient Location: ICU  Anesthesia Type:MAC  Level of Consciousness: drowsy and patient cooperative  Airway & Oxygen Therapy: Patient Spontanous Breathing and Patient connected to nasal cannula oxygen  Post-op Assessment: Report given to RN, Post -op Vital signs reviewed and stable and Patient moving all extremities  Post vital signs: Reviewed and stable  Last Vitals:  Vitals:   09/04/17 1008 09/04/17 1013  BP:    Pulse: (!) 0 (!) 0  Resp:    Temp:    SpO2:      Last Pain: There were no vitals filed for this visit.       Complications: No apparent anesthesia complications

## 2017-09-04 NOTE — Anesthesia Preprocedure Evaluation (Addendum)
Anesthesia Evaluation  Patient identified by MRN, date of birth, ID band Patient awake    Reviewed: Allergy & Precautions, NPO status , Patient's Chart, lab work & pertinent test results, reviewed documented beta blocker date and time   History of Anesthesia Complications (+) PONV and history of anesthetic complications  Airway Mallampati: III  TM Distance: >3 FB Neck ROM: Full    Dental  (+) Teeth Intact, Dental Advisory Given   Pulmonary shortness of breath and with exertion, asthma , neg sleep apnea, neg recent URI, neg PE   breath sounds clear to auscultation       Cardiovascular hypertension, Pt. on medications and Pt. on home beta blockers (-) angina+ CAD  (-) Past MI + Valvular Problems/Murmurs AS, MR and AI  Rhythm:Regular + Systolic murmurs    Neuro/Psych negative neurological ROS  negative psych ROS   GI/Hepatic Neg liver ROS, GERD  Medicated and Controlled,  Endo/Other  diabetes, Type 2, Insulin Dependent, Oral Hypoglycemic AgentsHypothyroidism Morbid obesity  Renal/GU CRFRenal disease     Musculoskeletal  (+) Arthritis ,   Abdominal   Peds  Hematology  (+) anemia ,   Anesthesia Other Findings EF nl  Reproductive/Obstetrics                            Anesthesia Physical Anesthesia Plan  ASA: IV  Anesthesia Plan: MAC   Post-op Pain Management:    Induction:   PONV Risk Score and Plan: 4 or greater and Ondansetron, Dexamethasone, Midazolam and Treatment may vary due to age or medical condition  Airway Management Planned: Nasal Cannula  Additional Equipment: Arterial line, CVP and Ultrasound Guidance Line Placement  Intra-op Plan:   Post-operative Plan:   Informed Consent: I have reviewed the patients History and Physical, chart, labs and discussed the procedure including the risks, benefits and alternatives for the proposed anesthesia with the patient or authorized  representative who has indicated his/her understanding and acceptance.   Dental advisory given  Plan Discussed with: CRNA, Anesthesiologist and Surgeon  Anesthesia Plan Comments:        Anesthesia Quick Evaluation

## 2017-09-04 NOTE — Care Management Note (Addendum)
Case Management Note  Patient Details  Name: Stacey Pierce MRN: [id number] Date of Birth: 12/04/1941  Subjective/Objective:     From home with spouse, pta indep, she did not use any assistive devices prior to admit,  post op TAVR, she has a PCP and she has medication coverage..              Action/Plan: NCM will follow for dc needs.   Expected Discharge Date:                  Expected Discharge Plan:  Home/Self Care  In-House Referral:     Discharge planning Services  CM Consult  Post Acute Care Choice:    Choice offered to:     DME Arranged:    DME Agency:     HH Arranged:    HH Agency:     Status of Service:  In process, will continue to follow  If discussed at Long Length of Stay Meetings, dates discussed:    Additional Comments:  Zenon Mayo, RN 09/04/2017, 1:02 PM

## 2017-09-04 NOTE — Progress Notes (Signed)
      [address]       [place] [address]             [phone]      S/p TAVR  BP (!) 150/54   Pulse 67   Temp (!) 96.1 F (35.6 C) (Axillary)   Resp (!) 21   SpO2 99%    Intake/Output Summary (Last 24 hours) at 09/04/17 1849 Last data filed at 09/04/17 1800  Gross per 24 hour  Intake          1647.28 ml  Output             1480 ml  Net           167.28 ml   Doing well early postop  Remo Lipps C. Roxan Hockey, MD [facility] [phone]

## 2017-09-04 NOTE — H&P (View-Only) (Signed)
[facility]       CARDIOTHORACIC SURGERY NOTE  Referring Provider is Corey Skains, MD PCP is Jerrol Banana., MD   HPI:  Patient is a 76 year old obese white female with history of aortic stenosis, hypertension, type 2 diabetes mellitus with complications, chronic kidney disease, and hyperlipidemia who returns to the office today for follow-up of severe symptomatic aortic stenosis. She was last seen here in our office on 07/23/2017. Since then she underwent left and right heart catheterization by Dr. Nehemiah Massed on 08/02/2017. Catheterization confirmed the presence of moderate nonobstructive on he artery disease and severe aortic stenosis. Peak to peak and mean transvalvular gradients across the aortic valve were reported 58 and 32 mmHg, respectively, corresponding to aortic valve area calculated 0.9 cm. She has normal left ventricular systolic function with moderate left ventricular hypertrophy. Right heart pressures were minimally elevated. She subsequently underwent CT angiography to further characterize the feasibility of transcatheter aortic valve replacement and she returns to the office today to discuss treatment options further. She reports no new problems or complaints over the last few weeks.  She continues to describe stable symptoms of exertional shortness of breath and fatigue.   Current Outpatient Prescriptions  Medication Sig Dispense Refill  . acetaminophen (TYLENOL) 500 MG tablet Take 1,000 mg by mouth every 6 (six) hours as needed for mild pain.    Marland Kitchen amitriptyline (ELAVIL) 10 MG tablet TAKE ONE TABLET BY MOUTH AT BEDTIME 90 tablet 3  . amLODipine (NORVASC) 5 MG tablet TAKE 1 TABLET (5 MG TOTAL) BY MOUTH DAILY. 90 tablet 3  . aspirin 81 MG tablet Take 81 mg by mouth at bedtime.     Marland Kitchen atorvastatin (LIPITOR) 40 MG tablet Take 1 tablet (40 mg total) by mouth daily. (Patient taking differently: Take 40 mg by mouth at  bedtime. ) 90 tablet 3  . Blood Glucose Monitoring Suppl (FIFTY50 GLUCOSE METER 2.0) w/Device KIT Use as directed.    . carvedilol (COREG) 25 MG tablet TAKE 1 TABLET BY MOUTH 2 TIMES DAILY WITH A MEAL. 180 tablet 3  . diphenhydrAMINE (BENADRYL) 25 mg capsule Take 25 mg by mouth daily.    . dorzolamide-timolol (COSOPT) 22.3-6.8 MG/ML ophthalmic solution Place 1 drop into both eyes 2 (two) times daily.  5  . glucose blood test strip Use three test strips daily as instructed.    . insulin aspart (NOVOLOG) 100 UNIT/ML injection Inject 10-15 Units into the skin 2 (two) times daily before a meal. Takes 10 units before breakfast and 15 units before dinner.    . insulin detemir (LEVEMIR) 100 UNIT/ML injection Inject 0.18 mLs (18 Units total) into the skin 2 (two) times daily. (Patient taking differently: Inject 20-50 Units into the skin 2 (two) times daily. 20 units in the morning 50 units in the evening) 10 mL 11  . latanoprost (XALATAN) 0.005 % ophthalmic solution Place 1 drop into both eyes at bedtime.     Marland Kitchen levothyroxine (SYNTHROID, LEVOTHROID) 100 MCG tablet Take 1 tablet (100 mcg total) by mouth daily. Take early morning EMPTY stomach. 90 tablet 3  . losartan (COZAAR) 100 MG tablet TAKE 1 TABLET BY MOUTH EVERY DAY 90 tablet 3  . metFORMIN (GLUCOPHAGE) 1000 MG tablet Take 2,000 mg by mouth daily after supper.    Marland Kitchen omeprazole (PRILOSEC OTC) 20 MG tablet Take 20 mg by mouth daily.      No current facility-administered medications for this visit.  Physical Exam:   Ht _0  (1.626 m)   Wt 190 lb (86.2 kg)   BMI 32.61 kg/m   General:  Well-appearing  Chest:   Clear to auscultation  CV:   Prominent systolic murmur  Incisions:  n/a  Abdomen:  Soft and nontender  Extremities:  Warm and well-perfused  Diagnostic Tests:  RIGHT/LEFT HEART CATH AND CORONARY ANGIOGRAPHY  Conclusion     Prox RCA lesion, 25 %stenosed.  Mid RCA lesion, 20 %stenosed.  Ost Cx to Prox Cx lesion, 10  %stenosed.  Ost LAD to Prox LAD lesion, 15 %stenosed.  There is severe aortic valve stenosis. There is trivial (1+) aortic regurgitation.  There is trivial (1+) aortic regurgitation.   Assessment The patient has had Acute on chronic CCS III symptoms and significant progression of aortic stenosis  normal left ventricular function with ejection fraction of 60% with moderate lvh  Pulmonary capillary wedge pressures with no elevation. no pulmonary hypertension  severe aortic stenosis with a valve area of 0.9 centimeters squared and a mean gradient of 32 mm Hg and peak gradient of 92m Hg  abnormal - minimal atherosclerosis of  coronary arteries with minimal 3 vessel arterial disease and up to 25% stenosis  Plan Aggressive medical management of current symptoms with appropriate use of beta blockers, ACE inhibitors, and diuretics. Consider consultation for aortic valve replacement   Indications   Aortic valve stenosis, etiology of cardiac valve disease unspecified [I35.0 (ICD-10-CM)]  Procedural Details/Technique   Technical Details Procedural details: The right groin was prepped, draped, and anesthetized with 1% lidocaine. Using modified Seldinger technique, a 6 French sheath was introduced into the right femoral artery. Standard Judkins catheters were used for coronary angiography right heart with swan ganz and laortiography. Catheter exchanges were performed over a guidewire. There were no immediate procedural complications. The patient was transferred to the post catheterization recovery area for further monitoring.  During this procedure the patient is administered a total of Versed 1 mg and Fentanyl 25 mg to achieve and maintain moderate conscious sedation. The patient's heart rate, blood pressure, and oxygen saturation are monitored continuously during the procedure. The period of conscious sedation is 33 minutes, of which I was present face-to-face 100% of this  time.    Estimated blood loss <50 mL.  During this procedure the patient was administered the following to achieve and maintain moderate conscious sedation: Versed mg, while the patient's heart rate, blood pressure, and oxygen saturation were continuously monitored.    Coronary Findings   Dominance: Left  Left Anterior Descending  Ost LAD to Prox LAD lesion, 15% stenosed.  Left Circumflex  Ost Cx to Prox Cx lesion, 10% stenosed.  Right Coronary Artery  Prox RCA lesion, 25% stenosed.  Mid RCA lesion, 20% stenosed.  Wall Motion              Left Heart   Aortic Valve There is severe aortic valve stenosis. There is trivial (1+) aortic regurgitation. The aortic valve is calcified. There is restricted aortic valve motion.    Aorta Aortic Root: The aortic root is enlarged. The aortic root displays mild dilation. There is trivial (1+) aortic regurgitation. There is a significant gradient across the aortic valve.    Coronary Diagrams   Diagnostic Diagram           Impression:Cardiac TAVR CT  TECHNIQUE: The patient was scanned on a [employer] A 120 kV retrospective scan was triggered in the descending thoracic aorta at 111  HU's. Gantry rotation speed was 250 msecs and collimation was .6 mm. No beta blockade or nitro were given. The 3D data set was reconstructed in 5% intervals of the R-R cycle. Systolic and diastolic phases were analyzed on a dedicated work station using MPR, MIP and VRT modes. The patient received 80 cc of contrast.  FINDINGS: Aortic Valve: Trileaflet, but functionally bileaflet aortic valve with partially co-joined left and right leaflets. The leaflets are severely thickened and moderately calcified with only trivial calcifications extending into the LVOT.  Aorta: Normal size. Moderate diffuse calcifications in the aortic arch and descending aorta. No dissection.  Sinotubular Junction:  30 x 29 mm  Ascending Thoracic Aorta:  35  x 35 mm  Aortic Arch:  25 x 24 mm  Descending Thoracic Aorta:  17 x 17 mm  Sinus of Valsalva Measurements:  Non-coronary:  31 mm  Right -coronary:  27 mm  Left -coronary:  32 mm  Coronary Artery Height above Annulus:  Left Main:  14 mm  Right Coronary:  14 mm  Virtual Basal Annulus Measurements:  Maximum/Minimum Diameter:  24 x 20 mm  Perimeter:  72 mm  Area:  397 mm  Coronary Arteries:  Normal origin.  Optimum Fluoroscopic Angle for Delivery:  LAO 3 CAU 2  IMPRESSION: 1. Trileaflet, but functionally bileaflet aortic valve with partially co-joined left and right leaflets. The leaflets are severely thickened and moderately calcified with only trivial calcifications extending into the LVOT. Annular measurements suitable for delivery of a 23 mm Edwards-SAPIEN 3 valve.  2. Sufficient coronary to annular distance.  3. Optimum Fluoroscopic Angle for Delivery: LAO 3 CAU 2  4. There is a large left atrial appendage with no evidence for a thrombus.  Ena Dawley   Electronically Signed   By: Ena Dawley   On: 08/13/2017 19:50   Impression:  Patient has stage D severe symptomatic aortic stenosis with normal left ventricular systolic function.  She describes stable symptoms of exertional shortness of breath consistent with chronic diastolic congestive heart failure, New York Heart Association functional class II.  I have personally reviewed the patient's most recent diagnostic cardiac catheterization which reveals moderate nonobstructive coronary artery disease and normal left ventricular systolic function. Peak to peak and mean transvalvular gradients at the time of catheterization were measured 58 and 32 mmHg, respectively, corresponding to aortic valve area calculated 0.9 cm.  Cardiac-gated CTA of the heart reveals anatomical characteristics consistent with aortic stenosis suitable for treatment by transcatheter aortic valve replacement  without any significant complicating features and CTA of the aorta and iliac vessels demonstrate what appears to be adequate pelvic vascular access to facilitate a transfemoral approach, although this scan has not yet been formally read by radiologist.  Risks associated with conventional surgical aortic valve replacement would be at least an early elevated because of the patient's age, comorbid medical conditions, and significant physical deconditioning. The patient's convalescence following even uncomplicated surgery would likely be somewhat slow. I feel that transcatheter aortic valve replacement would be somewhat less risky and considerably less morbidity.   Plan:  The patient and her husband were again counseled at length regarding treatment alternatives for management of severe symptomatic aortic stenosis. Alternative approaches such as conventional aortic valve replacement, transcatheter aortic valve replacement, and palliative medical therapy were compared and contrasted at length.  The risks associated with conventional surgical aortic valve replacement were been discussed in detail, as were expectations for post-operative convalescence.  Issues specific to transcatheter aortic valve replacement  were discussed including questions about long term valve durability, the potential for paravalvular leak, possible increased risk of need for permanent pacemaker placement, and other technical complications related to the procedure itself.  Long-term prognosis with medical therapy was discussed. This discussion was placed in the context of the patient's own specific clinical presentation and past medical history.  All of their questions been addressed.  The patient hopes to proceed with transcatheter aortic valve replacement in the near future. We tentatively plan for elective transcatheter aortic valve replacement via percutaneous transfemoral approach on 09/04/2017.  Following the decision to proceed with  transcatheter aortic valve replacement, a discussion has been held regarding what types of management strategies would be attempted intraoperatively in the event of life-threatening complications, including whether or not the patient would be considered a candidate for the use of cardiopulmonary bypass and/or conversion to open sternotomy for attempted surgical intervention.  The patient has been advised of a variety of complications that might develop including but not limited to risks of death, stroke, paravalvular leak, aortic dissection or other major vascular complications, aortic annulus rupture, device embolization, cardiac rupture or perforation, mitral regurgitation, acute myocardial infarction, arrhythmia, heart block or bradycardia requiring permanent pacemaker placement, congestive heart failure, respiratory failure, renal failure, pneumonia, infection, other late complications related to structural valve deterioration or migration, or other complications that might ultimately cause a temporary or permanent loss of functional independence or other long term morbidity.  The patient provides full informed consent for the procedure as described and all questions were answered.   I spent in excess of 15 minutes during the conduct of this office consultation and >50% of this time involved direct face-to-face encounter with the patient for counseling and/or coordination of their care.    Valentina Gu. Roxy Manns, MD 08/14/2017 10:19 AM

## 2017-09-04 NOTE — Anesthesia Procedure Notes (Signed)
Arterial Line Insertion Start/End10/01/2017 7:00 AM, 09/04/2017 7:15 AM Performed by: Moshe Salisbury, CRNA  Patient location: Pre-op. Preanesthetic checklist: patient identified, IV checked, site marked, risks and benefits discussed, surgical consent, monitors and equipment checked, pre-op evaluation, timeout performed and anesthesia consent Lidocaine 1% used for infiltration and patient sedated Right, radial was placed Catheter size: 20 G Hand hygiene performed , maximum sterile barriers used  and Seldinger technique used Allen's test indicative of satisfactory collateral circulation Attempts: 3 (Attempted X 2 by Ronny Bacon CRNA without success) Procedure performed without using ultrasound guided technique. Following insertion, dressing applied and Biopatch. Post procedure assessment: normal  Patient tolerated the procedure well with no immediate complications.

## 2017-09-04 NOTE — Anesthesia Procedure Notes (Signed)
Central Venous Catheter Insertion Performed by: Oleta Mouse, anesthesiologist Start/End10/01/2017 7:24 AM, 09/04/2017 7:30 AM Patient location: Pre-op. Preanesthetic checklist: patient identified, IV checked, site marked, risks and benefits discussed, surgical consent, monitors and equipment checked, pre-op evaluation, timeout performed and anesthesia consent Lidocaine 1% used for infiltration and patient sedated Hand hygiene performed  and maximum sterile barriers used  Catheter size: 8 Fr Total catheter length 16. Central line was placed.Double lumen Procedure performed using ultrasound guided technique. Ultrasound Notes:image(s) printed for medical record Attempts: 1 Following insertion, dressing applied, line sutured and Biopatch. Post procedure assessment: blood return through all ports, free fluid flow and no air  Patient tolerated the procedure well with no immediate complications.

## 2017-09-04 NOTE — Op Note (Signed)
[facility]ENTER   MULTIDISCIPLINARY HEART VALVE TEAM   TAVR OPERATIVE NOTE   Date of Procedure:  09/04/2017  Preoperative Diagnosis: Severe Aortic Stenosis   Postoperative Diagnosis: Same   Procedure:    Transcatheter Aortic Valve Replacement - Percutaneous Transfemoral Approach  Medtronic Evolut Pro (size 26 mm, serial # [id number])   Co-Surgeons:  Valentina Gu. Roxy Manns, MD and Sherren Mocha, MD  Anesthesiologist:  Laurie Panda, MD  Echocardiographer:  Ena Dawley, MD  Pre-operative Echo Findings:  Severe aortic stenosis  Normal left ventricular systolic function  Post-operative Echo Findings:  Trace paravalvular leak  Normal left ventricular systolic function  BRIEF CLINICAL NOTE AND INDICATIONS FOR SURGERY  DETAILS OF THE OPERATIVE PROCEDURE  Please see complete note of Dr Roxy Manns  PREPARATION:   The patient is brought to the operating room on the above mentioned date and central monitoring was established by the anesthesia team including placement of a central venous catheter and radial arterial line. The patient is placed in the supine position on the operating table.  Intravenous antibiotics are administered. The patient is monitored closely throughout the procedure under conscious sedation.  Baseline transthoracic echocardiogram is performed. The patient's chest, abdomen, both groins, and both lower extremities are prepared and draped in a sterile manner. A time out procedure is performed.  PERIPHERAL ACCESS:   Using ultrasound guidance, femoral arterial and venous access is obtained with placement of 6 Fr sheaths on the left side.  A pigtail diagnostic catheter was passed through the femoral arterial sheath under fluoroscopic guidance into the aortic root.  A temporary transvenous pacemaker catheter was passed through the femoral venous sheath under fluoroscopic guidance into the right ventricle.  The pacemaker was tested to ensure stable lead placement  and pacemaker capture.  TRANSFEMORAL ACCESS:  A micropuncture technique is used to access the right femoral artery under fluoroscopic and ultrasound guidance.  2 Perclose devices are deployed at 10' and 2' positions to 'PreClose' the femoral artery. An 8 French sheath is placed and then an Amplatz Superstiff wire is advanced through the sheath. This is changed out for a 16 [place] Dry Seal Sheath after progressively dilating over the Superstiff wire.  A Feldman aortic stenosis catheter was used to direct a straight-tip exchange length wire across the native aortic valve into the left ventricle. This was exchanged out for a pigtail catheter and position was confirmed in the LV apex. Simultaneous LV and Ao pressures were recorded.  The pigtail catheter was exchanged for a Confida wire in the LV apex.  Echocardiography was utilized to confirm appropriate wire position and no sign of entanglement in the mitral subvalvular apparatus.  TRANSCATHETER HEART VALVE DEPLOYMENT:  A Medtronic Evolut Pro heart valve (size 26 mm, serial #[id number]) was prepared and crimped per manufacturer's guidelines, and the proper orientation of the valve is confirmed under fluoroscopic assessment. The valve was initially introduced after removing the dry seal sheath. The in-line sheath was utilized. However, after the valve had been advanced into the body, the Confida wire had been pulled back across the aortic valve. The in-line sheath was removed and a 20 Fr Dry-Seal sheath was inserted. The Evolut valve was then introduced through the 20 Fr sheath after the aortic valve was re-crossed.  The valve was carefully positioned across the aortic valve annulus. The valve was deployed using the deployment dial so that 80% of the valve is released during pacing at 120 bpm. The valve was recaptured after angiographic assessment showed the valve  to be positioned too ventricular. The valve was repositioned and re-deployed in appropriate position.  The patient tolerated all of this well without hemodynamic compromise.  There is felt to be no paravalvular leak and no central aortic insufficiency by echo assessment.  The patient's hemodynamic recovery following valve deployment is good.   Echo demostrated acceptable post-procedural gradients, stable mitral valve function, and no aortic insufficiency. Aortography confirmed no greater than trace aortic insufficiency.    PROCEDURE COMPLETION:  The sheath was removed and femoral artery closure is performed using the 2 previously deployed Perclose devices.  Protamine is administered once femoral arterial repair was complete. The site is clear with no evidence of bleeding or hematoma after the sutures are tightened. The temporary pacemaker, pigtail catheters and femoral sheaths were removed with manual pressure used for hemostasis.   The patient tolerated the procedure well and is transported to the surgical intensive care in stable condition. There were no immediate intraoperative complications. All sponge instrument and needle counts are verified correct at completion of the operation.   The patient received a total of 60 mL of intravenous contrast during the procedure.   Sherren Mocha, MD 09/04/2017 3:57 PM

## 2017-09-04 NOTE — Anesthesia Procedure Notes (Signed)
Procedure Name: MAC Date/Time: 09/04/2017 8:03 AM Performed by: Melina Copa, Sema Stangler R Pre-anesthesia Checklist: Patient identified, Suction available, Emergency Drugs available, Patient being monitored and Timeout performed Oxygen Delivery Method: Nasal cannula Ventilation: Nasal airway inserted- appropriate to patient size Placement Confirmation: positive ETCO2 Dental Injury: Teeth and Oropharynx as per pre-operative assessment

## 2017-09-04 NOTE — Progress Notes (Signed)
   09/04/17 1247  Clinical Encounter Type  Visited With Patient;Patient and family together  Visit Type Initial;Psychological support;Spiritual support;Social support   Chaplain providing support for patient and family.  Will continue to follow as needed.

## 2017-09-04 NOTE — Op Note (Signed)
[facility]   MULTIDISCIPLINARY HEART VALVE TEAM   TAVR OPERATIVE NOTE   Date of Procedure:  09/04/2017  Preoperative Diagnosis: Severe Aortic Stenosis   Postoperative Diagnosis: Same   Procedure:    Transcatheter Aortic Valve Replacement - Percutaneous Right Transfemoral Approach  Medtronic CoreValve Evolut Pro (size 26 mm, serial # [id number])   Co-Surgeons:  Valentina Gu. Roxy Manns, MD and Sherren Mocha, MD  Anesthesiologist:  Laurie Panda, MD  Echocardiographer:  Ena Dawley, MD  Pre-operative Echo Findings:  Severe aortic stenosis  Normal left ventricular systolic function  Post-operative Echo Findings:  No paravalvular leak  Normal left ventricular systolic function   BRIEF CLINICAL NOTE AND INDICATIONS FOR SURGERY  Patient is a 76 year old obese white female with history of aortic stenosis, hypertension, type 2 diabetes mellitus with complications, chronic kidney disease, and hyperlipidemia who returns to the office today for follow-up of severe symptomatic aortic stenosis. She was last seen here in our office on 07/23/2017. Since then she underwent left and right heart catheterization by Dr. Nehemiah Massed on 08/02/2017. Catheterization confirmed the presence of moderate nonobstructive on he artery disease and severe aortic stenosis. Peak to peak and mean transvalvular gradients across the aortic valve were reported 58 and 32 mmHg, respectively, corresponding to aortic valve area calculated 0.9 cm. She has normal left ventricular systolic function with moderate left ventricular hypertrophy. Right heart pressures were minimally elevated. She subsequently underwent CT angiography to further characterize the feasibility of transcatheter aortic valve replacement and she returns to the office today to discuss treatment options further. She reports no new problems or complaints over the last few weeks.  She continues to describe stable symptoms of exertional shortness of  breath and fatigue.  During the course of the patient's preoperative work up they have been evaluated comprehensively by a multidisciplinary team of specialists coordinated through the [facility] in the [facility].  They have been demonstrated to suffer from symptomatic severe aortic stenosis as noted above. The patient has been counseled extensively as to the relative risks and benefits of all options for the treatment of severe aortic stenosis including long term medical therapy, conventional surgery for aortic valve replacement, and transcatheter aortic valve replacement.  All questions have been answered, and the patient provides full informed consent for the operation as described.   DETAILS OF THE OPERATIVE PROCEDURE  PREPARATION:    The patient is brought to the operating room on the above mentioned date and central monitoring was established by the anesthesia team including placement of a central venous line and radial arterial line. The patient is placed in the supine position on the operating table.  Intravenous antibiotics are administered. The patient is monitored closely throughout the procedure under conscious sedation.  Baseline transthoracic echocardiogram was performed. The patient's chest, abdomen, both groins, and both lower extremities are prepared and draped in a sterile manner. A time out procedure is performed.   PERIPHERAL ACCESS:    Using the modified Seldinger technique, femoral arterial and venous access was obtained with placement of 6 Fr sheaths on the left side.  A pigtail diagnostic catheter was passed through the left arterial sheath under fluoroscopic guidance into the aortic root.  The pigtail catheter is positioned in the non-coronary sinus of Valsalva.  A temporary transvenous pacemaker catheter was passed through the left femoral venous sheath under fluoroscopic guidance into the right ventricle.  The pacemaker  was tested to ensure stable lead placement and pacemaker  capture. Aortic root angiography was performed in order to determine the optimal angiographic angle for valve deployment.   TRANSFEMORAL ACCESS:   Percutaneous transfemoral access and sheath placement was performed by Dr. Burt Knack using ultrasound guidance.  The right common femoral artery was cannulated using a micropuncture needle and appropriate location was verified using hand injection angiogram.  A pair of Abbott Perclose percutaneous closure devices were placed and a 6 French sheath replaced into the femoral artery.  The patient was heparinized systemically and ACT verified > 250 seconds.    A 16 Fr transfemoral [place] DrySeal sheath was introduced into the right common femoral artery after progressively dilating over an Amplatz superstiff wire. A Feldman catheter was used to direct a straight-tip exchange length wire across the native aortic valve into the left ventricle. This was exchanged out for a pigtail catheter and position was confirmed in the LV apex. The pigtail catheter was exchanged for Confida wire in the LV apex.  Echocardiography was utilized to confirm appropriate wire position and no sign of entanglement in the mitral subvalvular apparatus.   TRANSCATHETER HEART VALVE DEPLOYMENT:   A Medtronic CoreValve Evolut Pro transcatheter heart valve (size 26 mm, serial #[id number]) was prepared and crimped per manufacturer's guidelines, and the proper loading of the valve is confirmed on the [facility] Pro delivery system using flouroscopy. The DrySeal sheath was removed and the valve and delivery system were advanced over the guidewire, through the iliac arteries and aorta, and advanced across the aortic arch using flouroscopy. At this point of the procedure the Confida wire inadvertently was pulled out of the left ventricle.  The entire delivery system was pulled out and a 20 Fr transfemoral [place] DrySeal sheath was advanced into the right  common femoral artery under fluoroscopic guidance.  A Feldman catheter was again used to direct a straight-tip exchange length wire across the native aortic valve into the left ventricle. This was exchanged out for a pigtail catheter and position was confirmed in the LV apex. The pigtail catheter was exchanged for Confida wire in the LV apex.  Echocardiography was utilized to confirm appropriate wire position and no sign of entanglement in the mitral subvalvular apparatus.  The same Medtronic CoreValve Evolut Pro transcatheter heart valve (size 26 mm, serial #[id number]) was then passed through the 20 Fr femoral sheath and advanced across the aortic arch and subsequently across the aortic valve annulus. Once appropriate position of the valve has been confirmed by angiographic assessment, the valve is deployed gradually to 80%, at which time a second aortogram was performed to confirm the appropriate depth and position of deployment.  The valve was initially felt to be too low in the LV outflow tract.  The valve was recaptured and repositioned under rapid pacing.  Once final position was confirmed, deployment was completed, the valve released, and the delivery system carefully removed from the aortic root. Valve function is assessed using echocardiography. There is felt to be trivial paravalvular leak and no central aortic insufficiency.  The patient's hemodynamic recovery following valve deployment is good.     PROCEDURE COMPLETION:   The deployment system is and guidewire were removed and femoral artery closure performed by securing the Perclose sutures.  Protamine was administered once femoral arterial repair was complete. The temporary pacemaker, pigtail catheters and femoral sheaths were removed with manual pressure used for hemostasis.   The patient tolerated the procedure well and is transported to the surgical intensive care in stable condition. There were no immediate intraoperative  complications. All  sponge instrument and needle counts are verified correct at completion of the operation.   No blood products were administered during the operation.  The patient received a total of 60.2 mL of intravenous contrast during the procedure.   Rexene Alberts, MD 09/04/2017 10:21 AM

## 2017-09-05 ENCOUNTER — Other Ambulatory Visit: Payer: Self-pay

## 2017-09-05 ENCOUNTER — Encounter (HOSPITAL_COMMUNITY): Payer: Self-pay | Admitting: Physician Assistant

## 2017-09-05 ENCOUNTER — Inpatient Hospital Stay (HOSPITAL_COMMUNITY): Payer: Medicare Other

## 2017-09-05 DIAGNOSIS — I350 Nonrheumatic aortic (valve) stenosis: Secondary | ICD-10-CM

## 2017-09-05 DIAGNOSIS — Z952 Presence of prosthetic heart valve: Secondary | ICD-10-CM

## 2017-09-05 LAB — BASIC METABOLIC PANEL
Anion gap: 9 (ref 5–15)
BUN: 18 mg/dL (ref 6–20)
CHLORIDE: 104 mmol/L (ref 101–111)
CO2: 22 mmol/L (ref 22–32)
Calcium: 9 mg/dL (ref 8.9–10.3)
Creatinine, Ser: 1.16 mg/dL — ABNORMAL HIGH (ref 0.44–1.00)
GFR calc Af Amer: 51 mL/min — ABNORMAL LOW (ref >60)
GFR, EST NON AFRICAN AMERICAN: 44 mL/min — AB (ref >60)
GLUCOSE: 260 mg/dL — AB (ref 65–99)
POTASSIUM: 4.7 mmol/L (ref 3.5–5.1)
Sodium: 135 mmol/L (ref 135–145)

## 2017-09-05 LAB — ECHOCARDIOGRAM COMPLETE
AV Area VTI index: 0.7 cm2/m2
AV Area VTI: 1.23 cm2
AV Mean grad: 9 mmHg
AV area mean vel ind: 0.73 cm2/m2
AV peak Index: 0.64
AV pk vel: 210 cm/s
AVAREAMEANV: 1.42 cm2
AVCELMEANRAT: 0.56
AVPG: 18 mmHg
Ao pk vel: 0.49 m/s
Area-P 1/2: 2.75 cm2
CHL CUP AV VEL: 1.35
CHL CUP MV DEC (S): 271
DOP CAL AO MEAN VELOCITY: 140 cm/s
E decel time: 271 msec
E/e' ratio: 19.76
FS: 30 % (ref 28–44)
IVS/LV PW RATIO, ED: 0.86
LA ID, A-P, ES: 32 mm
LA diam end sys: 32 mm
LA vol A4C: 36.3 ml
LA vol index: 21.9 mL/m2
LADIAMINDEX: 1.66 cm/m2
LAVOL: 42.2 mL
LDCA: 2.54 cm2
LV E/e' medial: 19.76
LV E/e'average: 19.76
LV TDI E'LATERAL: 5
LV TDI E'MEDIAL: 6.09
LV e' LATERAL: 5 cm/s
LVOT SV: 60 mL
LVOT VTI: 23.5 cm
LVOT peak grad rest: 4 mmHg
LVOT peak vel: 102 cm/s
LVOTD: 18 mm
LVOTVTI: 0.53 cm
MV Peak grad: 4 mmHg
MVPKAVEL: 139 m/s
MVPKEVEL: 98.8 m/s
P 1/2 time: 80 ms
PW: 14 mm — AB (ref 0.6–1.1)
RV LATERAL S' VELOCITY: 13.5 cm/s
TAPSE: 23.5 mm
VTI: 44.1 cm
Valve area index: 0.7
Valve area: 1.35 cm2
WEIGHTICAEL: 3100.55 [oz_av]

## 2017-09-05 LAB — CBC
HCT: 27.3 % — ABNORMAL LOW (ref 36.0–46.0)
Hemoglobin: 9 g/dL — ABNORMAL LOW (ref 12.0–15.0)
MCH: 28.5 pg (ref 26.0–34.0)
MCHC: 33 g/dL (ref 30.0–36.0)
MCV: 86.4 fL (ref 78.0–100.0)
PLATELETS: 167 10*3/uL (ref 150–400)
RBC: 3.16 MIL/uL — AB (ref 3.87–5.11)
RDW: 13.3 % (ref 11.5–15.5)
WBC: 12.5 10*3/uL — ABNORMAL HIGH (ref 4.0–10.5)

## 2017-09-05 LAB — GLUCOSE, CAPILLARY
GLUCOSE-CAPILLARY: 259 mg/dL — AB (ref 65–99)
Glucose-Capillary: 244 mg/dL — ABNORMAL HIGH (ref 65–99)
Glucose-Capillary: 294 mg/dL — ABNORMAL HIGH (ref 65–99)
Glucose-Capillary: 190 mg/dL — ABNORMAL HIGH (ref 65–99)

## 2017-09-05 LAB — MAGNESIUM: Magnesium: 1.3 mg/dL — ABNORMAL LOW (ref 1.7–2.4)

## 2017-09-05 MED ORDER — SODIUM CHLORIDE 0.9 % IV SOLN: 250.0000 mL | INTRAVENOUS | Status: DC | PRN

## 2017-09-05 MED ORDER — ATORVASTATIN CALCIUM 40 MG PO TABS
40.0000 mg | ORAL_TABLET | Freq: Every day | ORAL | Status: DC
  Administered 2017-09-05: 40 mg via ORAL
  Filled 2017-09-05: qty 1

## 2017-09-05 MED ORDER — AMLODIPINE BESYLATE 5 MG PO TABS
5.0000 mg | ORAL_TABLET | Freq: Every day | ORAL | Status: DC
  Administered 2017-09-05 – 2017-09-06 (×2): 5 mg via ORAL
  Filled 2017-09-05 (×2): qty 1

## 2017-09-05 MED ORDER — SODIUM CHLORIDE 0.9% FLUSH
3.0000 mL | Freq: Two times a day (BID) | INTRAVENOUS | Status: DC
  Administered 2017-09-05 (×2): 3 mL via INTRAVENOUS

## 2017-09-05 MED ORDER — LEVOTHYROXINE SODIUM 100 MCG PO TABS
100.0000 ug | ORAL_TABLET | Freq: Every day | ORAL | Status: DC
  Administered 2017-09-05 – 2017-09-06 (×2): 100 ug via ORAL
  Filled 2017-09-05 (×2): qty 1

## 2017-09-05 MED ORDER — CARVEDILOL 6.25 MG PO TABS
6.2500 mg | ORAL_TABLET | Freq: Two times a day (BID) | ORAL | Status: DC
  Administered 2017-09-05 (×2): 6.25 mg via ORAL
  Filled 2017-09-05 (×2): qty 1

## 2017-09-05 MED ORDER — ASPIRIN EC 81 MG PO TBEC: 81.0000 mg | DELAYED_RELEASE_TABLET | Freq: Every day | ORAL | Status: DC

## 2017-09-05 MED ORDER — MOVING RIGHT ALONG BOOK
Freq: Once | Status: AC
  Administered 2017-09-05: 09:00:00
  Filled 2017-09-05: qty 1

## 2017-09-05 MED ORDER — PANTOPRAZOLE SODIUM 40 MG PO TBEC
40.0000 mg | DELAYED_RELEASE_TABLET | Freq: Every day | ORAL | Status: DC
  Administered 2017-09-05 – 2017-09-06 (×2): 40 mg via ORAL
  Filled 2017-09-05 (×2): qty 1

## 2017-09-05 MED ORDER — OMEPRAZOLE MAGNESIUM 20 MG PO TBEC: 20.0000 mg | DELAYED_RELEASE_TABLET | Freq: Every day | ORAL | Status: DC

## 2017-09-05 MED ORDER — AMITRIPTYLINE HCL 10 MG PO TABS
10.0000 mg | ORAL_TABLET | Freq: Every day | ORAL | Status: DC
  Administered 2017-09-05: 10 mg via ORAL
  Filled 2017-09-05: qty 1

## 2017-09-05 MED ORDER — SODIUM CHLORIDE 0.9% FLUSH: 3.0000 mL | INTRAVENOUS | Status: DC | PRN

## 2017-09-05 NOTE — Progress Notes (Signed)
Patient ID: Stacey Pierce, female   DOB: 02/25/1941, 76 y.o.   MRN: [id number] [facility] Evening Rounds  Hemodynamically stable in sinus rhythm 70's.  Ambulated  2D echo today shows a normally functioning bioprosthetic valve with low mean gradient of 9 mm Hg, no paravalvular leak. LVEF 55-60%.

## 2017-09-05 NOTE — Progress Notes (Signed)
  Echocardiogram 2D Echocardiogram has been performed.  Stacey Pierce 09/05/2017, 12:51 PM

## 2017-09-05 NOTE — Progress Notes (Signed)
Inpatient Diabetes Program Recommendations  AACE/ADA: New Consensus Statement on Inpatient Glycemic Control (2015)  Target Ranges:  Prepandial:   less than 140 mg/dL      Peak postprandial:   less than 180 mg/dL (1-2 hours)      Critically ill patients:  140 - 180 mg/dL   Lab Results  Component Value Date   GLUCAP 277 (H) 09/04/2017   HGBA1C 7.5 (H) 08/30/2017    Review of Glycemic Control  Inpatient Diabetes Program Recommendations:  Please consider: -Novolog 5 units tid meal coverage if eats 50%  Thank you, Bethena Roys E. Corderro Koloski, RN, MSN, CDE  Diabetes Coordinator Inpatient Glycemic Control Team Team Pager [phone] (8am-5pm) 09/05/2017 1:58 PM

## 2017-09-05 NOTE — Progress Notes (Signed)
[facility] VALVE TEAM  Patient Name: Stacey Pierce Date of Encounter: 09/05/2017  Primary Cardiologist: Dr. Nehemiah Massed / Dr. Burt Knack (TAVR)  Hospital Problem List     Principal Problem:   S/P TAVR (transcatheter aortic valve replacement) Active Problems:   Aortic stenosis   Benign essential HTN   Back pain, chronic   Arteriosclerosis of coronary artery   Diabetes (HCC)   Chronic kidney disease (CKD), stage III (moderate) (HCC)   Aortic stenosis, severe     Subjective   No complaints. Has been up walking around and breathing well.   Inpatient Medications    Scheduled Meds: . aspirin EC  325 mg Oral Daily   Or  . aspirin  324 mg Per Tube Daily  . clopidogrel  75 mg Oral Q breakfast  . dorzolamide-timolol  1 drop Both Eyes BID  . Influenza vac split quadrivalent PF  0.5 mL Intramuscular Tomorrow-1000  . insulin aspart  0-24 Units Subcutaneous TID AC & HS  . insulin detemir  18 Units Subcutaneous BID  . latanoprost  1 drop Both Eyes QHS   Continuous Infusions: . albumin human    . lactated ringers    . levofloxacin (LEVAQUIN) IV    . nitroGLYCERIN    . phenylephrine (NEO-SYNEPHRINE) Adult infusion     PRN Meds: albumin human, lactated ringers, metoprolol tartrate, midazolam, morphine injection, ondansetron (ZOFRAN) IV, oxyCODONE, traMADol   Vital Signs    Vitals:   09/05/17 0411 09/05/17 0500 09/05/17 0615 09/05/17 0700  BP:  (!) 155/51 (!) 120/99 (!) 132/47  Pulse:  78 76 71  Resp:  15 (!) 21 17  Temp: 98.5 F (36.9 C)     TempSrc: Oral     SpO2:  96% 92% 93%  Weight:  193 lb 12.6 oz (87.9 kg)      Intake/Output Summary (Last 24 hours) at 09/05/17 0757 Last data filed at 09/05/17 0500  Gross per 24 hour  Intake          2274.74 ml  Output             2630 ml  Net          -355.26 ml   Filed Weights   09/05/17 0500  Weight: 193 lb 12.6 oz (87.9 kg)    Physical Exam   GEN: Well nourished, well developed,  in no acute distress. obese.  HEENT: Grossly normal.  Neck: Supple, no JVD, carotid bruits, or masses. Cardiac: RRR, no murmurs, rubs, or gallops. No clubbing, cyanosis, edema.  Radials/DP/PT 2+ and equal bilaterally.  Respiratory:  Respirations regular and unlabored, clear to auscultation bilaterally. GI: Soft, nontender, nondistended, BS + x 4. MS: no deformity or atrophy. Skin: warm and dry, no rash. Groin sites without erythema or ecchymosis Neuro:  Strength and sensation are intact. Psych: AAOx3.  Normal affect.  Labs    CBC  Recent Labs  09/04/17 0910 09/05/17 0431  WBC  --  12.5*  HGB 8.8* 9.0*  HCT 26.0* 27.3*  MCV  --  86.4  PLT  --  295   Basic Metabolic Panel  Recent Labs  09/04/17 0910 09/05/17 0431  NA 139 135  K 4.9 4.7  CL 106 104  CO2  --  22  GLUCOSE 244* 260*  BUN 22* 18  CREATININE 1.20* 1.16*  CALCIUM  --  9.0  MG  --  1.3*   Liver Function Tests No results for input(s): AST,  ALT, ALKPHOS, BILITOT, PROT, ALBUMIN in the last 72 hours. No results for input(s): LIPASE, AMYLASE in the last 72 hours. Cardiac Enzymes No results for input(s): CKTOTAL, CKMB, CKMBINDEX, TROPONINI in the last 72 hours. BNP Invalid input(s): POCBNP D-Dimer No results for input(s): DDIMER in the last 72 hours. Hemoglobin A1C No results for input(s): HGBA1C in the last 72 hours. Fasting Lipid Panel No results for input(s): CHOL, HDL, LDLCALC, TRIG, CHOLHDL, LDLDIRECT in the last 72 hours. Thyroid Function Tests No results for input(s): TSH, T4TOTAL, T3FREE, THYROIDAB in the last 72 hours.  Invalid input(s): FREET3  Telemetry    NSR with PVCs, sinus brady overnight- Personally Reviewed  ECG    NSR, ILBBB HR 70 - Personally Reviewed  Radiology    Dg Chest Port 1 View  Result Date: 09/04/2017 CLINICAL DATA:  Status post transcatheter aortic valve replacement EXAM: PORTABLE CHEST 1 VIEW COMPARISON:  08/30/2017 FINDINGS: Changes of recent aortic valve  replacement are noted. Aortic calcifications are seen. Cardiac shadow is mildly enlarged accentuated by the portable technique. Right jugular central line is seen. No pneumothorax is noted. No focal infiltrate is seen. No bony abnormality is noted. IMPRESSION: Postoperative change as described.  No acute abnormality seen. Electronically Signed   By: Inez Catalina M.D.   On: 09/04/2017 12:15    Cardiac Studies   TAVR OPERATIVE NOTE   Date of Procedure:                09/04/2017  Preoperative Diagnosis:      Severe Aortic Stenosis  Procedure:        Transcatheter Aortic Valve Replacement - Percutaneous Right Transfemoral Approach             Medtronic CoreValve Evolut Pro (size 26 mm, serial # [id number])            Pre-operative Echo Findings: ? Severe aortic stenosis ? Normal left ventricular systolic function  Post-operative Echo Findings: ? No paravalvular leak ? Normal left ventricular systolic function     Post operative echo pending.   Patient Profile     Stacey Pierce is a 76 y.o. female with a history of HTN, HLD, hypothyroidism, obesity, IDDM, CKD and severe symptomatic aortic stenosis who presented to [facility] on 09/04/17 for planned TAVR.     Assessment & Plan    Severe aortic stenosis: s/p successful TAVR with a 60m Medtronic Evolut Pro via R TF approach under MAC. Initial ECG with new LBBB but no high grade block. Groin sites stable. Continue ASA/plavix.  Post operative echo pending today. Plan for early ambulation and transfer to the floor. Will discontinue central line now.   HTN: BPs have been normal to mildly elevated. HR in 70s but 40s overnight. Home meds includes Coreg 266mBID, Losartan 10078maily, and amlodipine 5mg65mily. Will resume home amlodipine 5mg 38mly and decrease Coreg to 6.25mg 77m Will add back ARB as BP tolerates.   HLD: resume home statin therapy.  IDDM: HgA1c 7.5. Metformin being held. Continue SSI  Hypothyroidism: resume  Synthroid  CKD: creat remains stable.   Signed, KathryAngelena Form  09/05/2017, 7:57 AM  Pager [phone] seen and examined the patient and agree with the assessment and plan as outlined.  Doing well and maintaining NSR.  LBBB has improved/resolved.  Restart beta blocker slowly.  Mobilize.  Routine POD1 ECHO.  Anticipate likely d/c home tomorrow.  ClarenRexene Alberts0/02/2017 8:33 AM

## 2017-09-05 NOTE — Anesthesia Postprocedure Evaluation (Signed)
Anesthesia Post Note  Patient: Stacey Pierce  Procedure(s) Performed: TRANSCATHETER AORTIC VALVE REPLACEMENT, TRANSFEMORAL (N/A Chest) TRANSESOPHAGEAL ECHOCARDIOGRAM (TEE) (N/A )     Patient location during evaluation: ICU Anesthesia Type: MAC Level of consciousness: awake and alert Pain management: pain level controlled Vital Signs Assessment: post-procedure vital signs reviewed and stable Respiratory status: spontaneous breathing, nonlabored ventilation, respiratory function stable and patient connected to nasal cannula oxygen Cardiovascular status: stable Postop Assessment: no apparent nausea or vomiting Anesthetic complications: no    Last Vitals:  Vitals:   09/05/17 1600 09/05/17 1641  BP:    Pulse:    Resp: 17   Temp:  37.1 C  SpO2:  97%    Last Pain:  Vitals:   09/05/17 1641  TempSrc: Oral  PainSc: 0-No pain                 Robb Sibal

## 2017-09-06 ENCOUNTER — Encounter (HOSPITAL_COMMUNITY): Payer: Self-pay | Admitting: Physician Assistant

## 2017-09-06 DIAGNOSIS — Z23 Encounter for immunization: Secondary | ICD-10-CM | POA: Diagnosis not present

## 2017-09-06 DIAGNOSIS — E669 Obesity, unspecified: Secondary | ICD-10-CM | POA: Diagnosis present

## 2017-09-06 LAB — CBC
HCT: 28.2 % — ABNORMAL LOW (ref 36.0–46.0)
Hemoglobin: 9.1 g/dL — ABNORMAL LOW (ref 12.0–15.0)
MCH: 28 pg (ref 26.0–34.0)
MCHC: 32.3 g/dL (ref 30.0–36.0)
MCV: 86.8 fL (ref 78.0–100.0)
PLATELETS: 198 10*3/uL (ref 150–400)
RBC: 3.25 MIL/uL — ABNORMAL LOW (ref 3.87–5.11)
RDW: 13.6 % (ref 11.5–15.5)
WBC: 8.7 10*3/uL (ref 4.0–10.5)

## 2017-09-06 LAB — BASIC METABOLIC PANEL
Anion gap: 9 (ref 5–15)
BUN: 20 mg/dL (ref 6–20)
CO2: 22 mmol/L (ref 22–32)
CREATININE: 1.41 mg/dL — AB (ref 0.44–1.00)
Calcium: 9 mg/dL (ref 8.9–10.3)
Chloride: 104 mmol/L (ref 101–111)
GFR, EST AFRICAN AMERICAN: 40 mL/min — AB (ref >60)
GFR, EST NON AFRICAN AMERICAN: 35 mL/min — AB (ref >60)
GLUCOSE: 267 mg/dL — AB (ref 65–99)
Potassium: 4.1 mmol/L (ref 3.5–5.1)
Sodium: 135 mmol/L (ref 135–145)

## 2017-09-06 LAB — GLUCOSE, CAPILLARY
GLUCOSE-CAPILLARY: 211 mg/dL — AB (ref 65–99)
GLUCOSE-CAPILLARY: 255 mg/dL — AB (ref 65–99)

## 2017-09-06 MED ORDER — CLOPIDOGREL BISULFATE 75 MG PO TABS: 75.0000 mg | ORAL_TABLET | Freq: Every day | ORAL | 6 refills | Status: DC

## 2017-09-06 MED ORDER — PANTOPRAZOLE SODIUM 40 MG PO TBEC: 40.0000 mg | DELAYED_RELEASE_TABLET | Freq: Every day | ORAL | 3 refills | Status: DC

## 2017-09-06 MED ORDER — CARVEDILOL 12.5 MG PO TABS
12.5000 mg | ORAL_TABLET | Freq: Two times a day (BID) | ORAL | Status: DC
  Administered 2017-09-06: 12.5 mg via ORAL
  Filled 2017-09-06: qty 1

## 2017-09-06 MED ORDER — LOSARTAN POTASSIUM 50 MG PO TABS
100.0000 mg | ORAL_TABLET | Freq: Every day | ORAL | Status: DC
  Administered 2017-09-06: 100 mg via ORAL
  Filled 2017-09-06: qty 2

## 2017-09-06 NOTE — Progress Notes (Signed)
CARDIAC REHAB PHASE I   PRE:  Rate/Rhythm: 74 SR  BP:  Sitting: 155/74        SaO2: 92 RA  MODE:  Ambulation: 420 ft   POST:  Rate/Rhythm: 96 SR  BP:  Sitting: 152/43         SaO2: 96 RA  Pt ambulated 420 ft on RA, hand held assist, steady gait, tolerated well with no complaints. Cardiac surgery discharge education completed with pt and husband at bedside. Reviewed restrictions, activity progression, exercise, heart healthy and diabetes diet handouts and phase 2 cardiac rehab. Pt verbalized understanding, agrees to phase 2 cardiac rehab referral, will send to [facility] per pt request. Pt to chair after walk, call bell within reach.   [phone] Lenna Sciara, RN, BSN 09/06/2017 11:06 AM

## 2017-09-06 NOTE — Care Management Note (Signed)
Case Management Note Original Note Created Zenon Mayo, RN 09/04/2017, 1:02 PM  Patient Details  Name: Stacey Pierce MRN: [id number] Date of Birth: [birth year]  Subjective/Objective:     From home with spouse, pta indep, she did not use any assistive devices prior to admit,  post op TAVR, she has a PCP and she has medication coverage..              Action/Plan: NCM will follow for dc needs.   Expected Discharge Date:  09/06/17               Expected Discharge Plan:  Home/Self Care  In-House Referral:  NA  Discharge planning Services  CM Consult  Post Acute Care Choice:  NA Choice offered to:  NA  DME Arranged:    DME Agency:     HH Arranged:    HH Agency:     Status of Service:  Completed, signed off  If discussed at [employer] of Stay Meetings, dates discussed:    Discharge Disposition: home/self care   Additional Comments:  09/06/17- 1030- Stacey Goedecke RN, CM- pt for d/c home today- no CM needs noted for discharge.  Stacey Client Kamas, RN 09/06/2017, 10:34 AM [phone]

## 2017-09-06 NOTE — Discharge Summary (Signed)
[place] [facility]VE TEAM   Discharge Summary    Patient ID: Stacey Pierce,  MRN: [id number], DOB/AGE: 08-08-1941 76 y.o.  Admit date: 09/04/2017 Discharge date: 09/06/2017  Primary Care Provider: Jerrol Banana. Primary Cardiologist: Dr. Nehemiah Massed / Dr. Burt Knack (TAVR)   Discharge Diagnoses    Principal Problem:   S/P TAVR (transcatheter aortic valve replacement) Active Problems:   Benign essential HTN   Back pain, chronic   Arteriosclerosis of coronary artery   Diabetes (HCC)   HLD (hyperlipidemia)   Chronic kidney disease (CKD), stage III (moderate) (HCC)   Adult hypothyroidism   Aortic stenosis, severe   Obesity   Allergies Allergies  Allergen Reactions  . Codeine Other (See Comments)    "COLIC"  . Erythromycin Rash  . Penicillins Rash    Has patient had a PCN reaction causing immediate rash, facial/tongue/throat swelling, SOB or lightheadedness with hypotension: No Has patient had a PCN reaction causing severe rash involving mucus membranes or skin necrosis: No Has patient had a PCN reaction that required hospitalization: No Has patient had a PCN reaction occurring within the last 10 years: No If all of the above answers are "NO", then may proceed with Cephalosporin use.   . Sulfa Antibiotics Other (See Comments)    "COLIC"     History of Present Illness     Stacey Pierce is a 76 y.o. female with a history of HTN, HLD, hypothyroidism, obesity, IDDM, CKD and severe symptomatic aortic stenosis who presented to [facility] on [facility]17 for planned TAVR.    In 05/2015 she had asymptomatic moderate aortic stenosis with normal LV systolic function and was followed. She was seen by Dr. Nehemiah Massed in 04/2017 and an echo then showed progression to severe AS with a mean gradient of 35 mm Hg and a peak of 58 mm Hg. LVEF remained 60%. There was moderate AI and MR. She reports having slowed down over the past year with exertional shortness of  breath and fatigue occurring with bending over or walking any distance. She has has some dizziness but no syncope. She denies chest pain or pressure but has had some pain in her left shoulder radiating down the left arm. She underwent cath on 08/02/2017 showing non-obstructive CAD and severe AS with a mean gradient of 32 mm Hg and normal EF.  She was ultimately referred for TAVR for treatment of her severe symptomatic AS, which was set up for 09/04/17.  Hospital Course     Consultants: none  Severe aortic stenosis: s/p successful TAVR with a 37m Medtronic Evolut Pro via R TF approach under MAC on 09/04/17. Initial ECG with new LBBB that nearly resolved on subsequent ECGs. Groin sites stable. Continue ASA/plavix. Post operative echo 10/3 showed good valve placement with mean gradient of 943mHg and no PVL. She will walk with cardiac rehab today and then plan for discharge home. I will see her back in 1 week for TOC and then 1 month with an echo.   HTN: BP elevated. We will resume all of her home medications of Losartan 10066maily, Coreg 44m39mD and amlodipine 5mg 41mly. We will follow this closely as an outpatient.    HLD: continue statin  IDDM: HgA1c 7.5. Metformin held 48 hours after surgery/contrast exposure. Continue home regimen   Hypothyroidism: continue Synthroid  CKD: creat remains stable.   GERD: prilosec switched to protonix given potential interaction with plavix   The patient has had an  uncomplicated hospital course and is recovering well. The femoral catheter sites are stable. She has been seen by Dr. Roxy Manns today and deemed ready for discharge home. All follow-up appointments have been scheduled. Discharge medications are listed below.  _____________  Discharge Vitals Blood pressure (!) 152/56, pulse 76, temperature 98.7 F (37.1 C), temperature source Oral, resp. rate 16, height _0  (1.651 m), weight 191 lb 1.6 oz (86.7 kg), SpO2 98 %.  Filed Weights   09/05/17 0500  09/05/17 2300 09/06/17 0500  Weight: 193 lb 12.6 oz (87.9 kg) 193 lb 12.6 oz (87.9 kg) 191 lb 1.6 oz (86.7 kg)    Labs & Radiologic Studies     CBC  Recent Labs  09/04/17 0910 09/05/17 0431  WBC  --  12.5*  HGB 8.8* 9.0*  HCT 26.0* 27.3*  MCV  --  86.4  PLT  --  808   Basic Metabolic Panel  Recent Labs  09/04/17 0910 09/05/17 0431  NA 139 135  K 4.9 4.7  CL 106 104  CO2  --  22  GLUCOSE 244* 260*  BUN 22* 18  CREATININE 1.20* 1.16*  CALCIUM  --  9.0  MG  --  1.3*   Liver Function Tests No results for input(s): AST, ALT, ALKPHOS, BILITOT, PROT, ALBUMIN in the last 72 hours. No results for input(s): LIPASE, AMYLASE in the last 72 hours. Cardiac Enzymes No results for input(s): CKTOTAL, CKMB, CKMBINDEX, TROPONINI in the last 72 hours. BNP Invalid input(s): POCBNP D-Dimer No results for input(s): DDIMER in the last 72 hours. Hemoglobin A1C No results for input(s): HGBA1C in the last 72 hours. Fasting Lipid Panel No results for input(s): CHOL, HDL, LDLCALC, TRIG, CHOLHDL, LDLDIRECT in the last 72 hours. Thyroid Function Tests No results for input(s): TSH, T4TOTAL, T3FREE, THYROIDAB in the last 72 hours.  Invalid input(s): FREET3  Dg Chest 2 View  Result Date: 08/30/2017 CLINICAL DATA:  Aortic valve replacement. EXAM: CHEST  2 VIEW COMPARISON:  No recent . FINDINGS: Mediastinum and hilar structures are normal. Heart size normal. No focal infiltrate. No pleural effusion or pneumothorax. Degenerative changes thoracic spine. IMPRESSION: No acute cardiopulmonary disease. Electronically Signed   By: Marcello Moores  Register   On: 08/30/2017 16:48   Ct Coronary Morph W/cta Cor Nancy Fetter W/ca W/cm &/or Wo/cm  Addendum Date: 08/15/2017   ADDENDUM REPORT: 08/15/2017 08:14 EXAM: OVER-READ INTERPRETATION  CT CHEST The following report is an over-read performed by radiologist Dr. Rebekah Chesterfield [facility] on 08/15/2017. This over-read does not include interpretation of  cardiac or coronary anatomy or pathology. The cardiac CTA interpretation by the cardiologist is attached. COMPARISON:  Cardiac CT 06/03/2015. FINDINGS: Relevant extracardiac findings will be described separately under dictation for contemporaneously obtained CTA of the chest, abdomen and pelvis. IMPRESSION: Please see separate dictation for contemporaneously obtained CTA of the chest, abdomen and pelvis 08/13/2017 for full description of relevant extracardiac findings. Electronically Signed   By: Vinnie Langton M.D.   On: 08/15/2017 08:14   Result Date: 08/15/2017 CLINICAL DATA:  67 -year-old female with severe aortic stenosis being evaluated for a TAVR procedure. EXAM: Cardiac TAVR CT TECHNIQUE: The patient was scanned on a [employer]. A 120 kV retrospective scan was triggered in the descending thoracic aorta at 111 HU's. Gantry rotation speed was 250 msecs and collimation was .6 mm. No beta blockade or nitro were given. The 3D data set was reconstructed in 5% intervals of the R-R cycle. Systolic and diastolic phases were  analyzed on a dedicated work station using MPR, MIP and VRT modes. The patient received 80 cc of contrast. FINDINGS: Aortic Valve: Trileaflet, but functionally bileaflet aortic valve with partially co-joined left and right leaflets. The leaflets are severely thickened and moderately calcified with only trivial calcifications extending into the LVOT. Aorta: Normal size. Moderate diffuse calcifications in the aortic arch and descending aorta. No dissection. Sinotubular Junction:  30 x 29 mm Ascending Thoracic Aorta:  35 x 35 mm Aortic Arch:  25 x 24 mm Descending Thoracic Aorta:  17 x 17 mm Sinus of Valsalva Measurements: Non-coronary:  31 mm Right -coronary:  27 mm Left -coronary:  32 mm Coronary Artery Height above Annulus: Left Main:  14 mm Right Coronary:  14 mm Virtual Basal Annulus Measurements: Maximum/Minimum Diameter:  24 x 20 mm Perimeter:  72 mm Area:  397 mm Coronary  Arteries:  Normal origin. Optimum Fluoroscopic Angle for Delivery:  LAO 3 CAU 2 IMPRESSION: 1. Trileaflet, but functionally bileaflet aortic valve with partially co-joined left and right leaflets. The leaflets are severely thickened and moderately calcified with only trivial calcifications extending into the LVOT. Annular measurements suitable for delivery of a 23 mm Edwards-SAPIEN 3 valve. 2. Sufficient coronary to annular distance. 3. Optimum Fluoroscopic Angle for Delivery: LAO 3 CAU 2 4. There is a large left atrial appendage with no evidence for a thrombus. Ena Dawley Electronically Signed: By: Ena Dawley On: 08/13/2017 19:50   Dg Chest Port 1 View  Result Date: 09/04/2017 CLINICAL DATA:  Status post transcatheter aortic valve replacement EXAM: PORTABLE CHEST 1 VIEW COMPARISON:  08/30/2017 FINDINGS: Changes of recent aortic valve replacement are noted. Aortic calcifications are seen. Cardiac shadow is mildly enlarged accentuated by the portable technique. Right jugular central line is seen. No pneumothorax is noted. No focal infiltrate is seen. No bony abnormality is noted. IMPRESSION: Postoperative change as described.  No acute abnormality seen. Electronically Signed   By: Inez Catalina M.D.   On: 09/04/2017 12:15   Ct Angio Chest Aorta W &/or Wo Contrast  Result Date: 08/15/2017 CLINICAL DATA:  76 year old female with history of severe aortic valve stenosis. Preprocedural study prior to potential transcatheter aortic valve replacement (TAVR) procedure. EXAM: CT ANGIOGRAPHY CHEST, ABDOMEN AND PELVIS TECHNIQUE: Multidetector CT imaging through the chest, abdomen and pelvis was performed using the standard protocol during bolus administration of intravenous contrast. Multiplanar reconstructed images and MIPs were obtained and reviewed to evaluate the vascular anatomy. CONTRAST:  95 mL of Isovue 370. COMPARISON:  Cardiac CT 06/03/2015. CT the abdomen and pelvis 03/02/2011. FINDINGS: CTA CHEST  FINDINGS Cardiovascular: Heart size is mildly enlarged. There is no significant pericardial fluid, thickening or pericardial calcification. There is aortic atherosclerosis, as well as atherosclerosis of the great vessels of the mediastinum and the coronary arteries, including calcified atherosclerotic plaque in the left main, left anterior descending and left circumflex coronary arteries. Severe thickening calcification of the aortic valve. Severe calcification of the mitral annulus. Mediastinum/Lymph Nodes: No pathologically enlarged mediastinal or hilar lymph nodes. Esophagus is unremarkable in appearance. No axillary lymphadenopathy. Lungs/Pleura: No acute consolidative airspace disease. No pleural effusions. No suspicious appearing pulmonary nodules or masses. Musculoskeletal/Soft Tissues: There are no aggressive appearing lytic or blastic lesions noted in the visualized portions of the skeleton. CTA ABDOMEN AND PELVIS FINDINGS Hepatobiliary: No cystic or solid hepatic lesions. No intra or extrahepatic biliary ductal dilatation. Status post cholecystectomy. Pancreas: No pancreatic mass. No pancreatic ductal dilatation. No pancreatic or peripancreatic fluid or inflammatory  changes. Spleen: Unremarkable. Adrenals/Urinary Tract: Bilateral kidneys and bilateral adrenal glands are normal in appearance. There is no hydroureteronephrosis. Urinary bladder is normal in appearance. Stomach/Bowel: Appearance of the stomach is normal. No pathologic dilatation of small bowel or colon. The appendix is not confidently identified and may be surgically absent. Regardless, there are no inflammatory changes noted adjacent to the cecum to suggest the presence of an acute appendicitis at this time. Vascular/Lymphatic: Aortic atherosclerosis, with vascular findings and measurements pertinent to potential TAVR procedure, as detailed below. 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 Celiac axis,  superior mesenteric artery and inferior mesenteric artery and their major branches are all widely patent without hemodynamically significant stenosis. Single renal arteries are widely patent bilaterally. No lymphadenopathy noted in the abdomen or pelvis. Reproductive: Uterus and ovaries are unremarkable in appearance. Other: No significant volume of ascites.  No pneumoperitoneum. Musculoskeletal: There are no aggressive appearing lytic or blastic lesions noted in the visualized portions of the skeleton. VASCULAR MEASUREMENTS PERTINENT TO TAVR: AORTA: Minimal Aortic Diameter -  11 x 9 mm Severity of Aortic Calcification -  severe RIGHT PELVIS: Right Common Iliac Artery - Minimal Diameter - 8.4 x 7.0 mm Tortuosity - mild Calcification - mild Right External Iliac Artery - Minimal Diameter - 5.8 x 5.8 mm Tortuosity - mild Calcification - none Right Common Femoral Artery - Minimal Diameter - 6.7 x 4.3 mm Tortuosity - mild Calcification - mild to moderate LEFT PELVIS: Left Common Iliac Artery - Minimal Diameter - 6.6 x 6.0 mm Tortuosity - mild Calcification - moderate Left External Iliac Artery - Minimal Diameter - 6.0 x 6.2 mm Tortuosity - mild Calcification - none Left Common Femoral Artery - Minimal Diameter - 6.5 x 6.6 mm Tortuosity - mild Calcification - none Review of the MIP images confirms the above findings. IMPRESSION: 1. Vascular findings and measurements pertinent to potential TAVR procedure, as detailed above. The patient does appear to have suitable pelvic arterial access, particularly on the left side. 2. Severe thickening calcification of the aortic valve, compatible with the reported clinical history of severe aortic stenosis. 3. Aortic atherosclerosis, in addition to left main and 2 vessel coronary artery disease. Assessment for potential risk factor modification, dietary therapy or pharmacologic therapy may be warranted, if clinically indicated. 4. Additional incidental findings, as above.  Electronically Signed   By: Vinnie Langton M.D.   On: 08/15/2017 08:40   Ct Angio Abd/pel W/ And/or W/o  Result Date: 08/15/2017 CLINICAL DATA:  76 year old female with history of severe aortic valve stenosis. Preprocedural study prior to potential transcatheter aortic valve replacement (TAVR) procedure. EXAM: CT ANGIOGRAPHY CHEST, ABDOMEN AND PELVIS TECHNIQUE: Multidetector CT imaging through the chest, abdomen and pelvis was performed using the standard protocol during bolus administration of intravenous contrast. Multiplanar reconstructed images and MIPs were obtained and reviewed to evaluate the vascular anatomy. CONTRAST:  95 mL of Isovue 370. COMPARISON:  Cardiac CT 06/03/2015. CT the abdomen and pelvis 03/02/2011. FINDINGS: CTA CHEST FINDINGS Cardiovascular: Heart size is mildly enlarged. There is no significant pericardial fluid, thickening or pericardial calcification. There is aortic atherosclerosis, as well as atherosclerosis of the great vessels of the mediastinum and the coronary arteries, including calcified atherosclerotic plaque in the left main, left anterior descending and left circumflex coronary arteries. Severe thickening calcification of the aortic valve. Severe calcification of the mitral annulus. Mediastinum/Lymph Nodes: No pathologically enlarged mediastinal or hilar lymph nodes. Esophagus is unremarkable in appearance. No  axillary lymphadenopathy. Lungs/Pleura: No acute consolidative airspace disease. No pleural effusions. No suspicious appearing pulmonary nodules or masses. Musculoskeletal/Soft Tissues: There are no aggressive appearing lytic or blastic lesions noted in the visualized portions of the skeleton. CTA ABDOMEN AND PELVIS FINDINGS Hepatobiliary: No cystic or solid hepatic lesions. No intra or extrahepatic biliary ductal dilatation. Status post cholecystectomy. Pancreas: No pancreatic mass. No pancreatic ductal dilatation. No pancreatic or peripancreatic fluid or  inflammatory changes. Spleen: Unremarkable. Adrenals/Urinary Tract: Bilateral kidneys and bilateral adrenal glands are normal in appearance. There is no hydroureteronephrosis. Urinary bladder is normal in appearance. Stomach/Bowel: Appearance of the stomach is normal. No pathologic dilatation of small bowel or colon. The appendix is not confidently identified and may be surgically absent. Regardless, there are no inflammatory changes noted adjacent to the cecum to suggest the presence of an acute appendicitis at this time. Vascular/Lymphatic: Aortic atherosclerosis, with vascular findings and measurements pertinent to potential TAVR procedure, as detailed below. 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 Celiac axis, superior mesenteric artery and inferior mesenteric artery and their major branches are all widely patent without hemodynamically significant stenosis. Single renal arteries are widely patent bilaterally. No lymphadenopathy noted in the abdomen or pelvis. Reproductive: Uterus and ovaries are unremarkable in appearance. Other: No significant volume of ascites.  No pneumoperitoneum. Musculoskeletal: There are no aggressive appearing lytic or blastic lesions noted in the visualized portions of the skeleton. VASCULAR MEASUREMENTS PERTINENT TO TAVR: AORTA: Minimal Aortic Diameter -  11 x 9 mm Severity of Aortic Calcification -  severe RIGHT PELVIS: Right Common Iliac Artery - Minimal Diameter - 8.4 x 7.0 mm Tortuosity - mild Calcification - mild Right External Iliac Artery - Minimal Diameter - 5.8 x 5.8 mm Tortuosity - mild Calcification - none Right Common Femoral Artery - Minimal Diameter - 6.7 x 4.3 mm Tortuosity - mild Calcification - mild to moderate LEFT PELVIS: Left Common Iliac Artery - Minimal Diameter - 6.6 x 6.0 mm Tortuosity - mild Calcification - moderate Left External Iliac Artery - Minimal Diameter - 6.0 x 6.2 mm Tortuosity - mild Calcification - none Left  Common Femoral Artery - Minimal Diameter - 6.5 x 6.6 mm Tortuosity - mild Calcification - none Review of the MIP images confirms the above findings. IMPRESSION: 1. Vascular findings and measurements pertinent to potential TAVR procedure, as detailed above. The patient does appear to have suitable pelvic arterial access, particularly on the left side. 2. Severe thickening calcification of the aortic valve, compatible with the reported clinical history of severe aortic stenosis. 3. Aortic atherosclerosis, in addition to left main and 2 vessel coronary artery disease. Assessment for potential risk factor modification, dietary therapy or pharmacologic therapy may be warranted, if clinically indicated. 4. Additional incidental findings, as above. Electronically Signed   By: Vinnie Langton M.D.   On: 08/15/2017 08:40     Diagnostic Studies/Procedures    TAVR OPERATIVE NOTE   Date of Procedure:09/04/2017  Preoperative Diagnosis:Severe Aortic Stenosis  Procedure:   Transcatheter Aortic Valve Replacement - Percutaneous RightTransfemoral Approach Medtronic CoreValve Evolut Pro (size 35m, serial # [id number]   Pre-operative Echo Findings: ? Severe aortic stenosis ? Normalleft ventricular systolic function  Post-operative Echo Findings: ? Noparavalvular leak ? Normalleft ventricular systolic function  ________________________   Post operative echo 09/05/17 Left ventricle: The cavity size was normal. Wall thickness was increased in a pattern of mild LVH. Systolic function was normal. The estimated ejection fraction was in the range  of 55% to 60%. Wall motion was normal; there were no regional wall motion abnormalities. Doppler parameters are consistent with abnormal left ventricular relaxation (grade 1 diastolic dysfunction). - Aortic valve: The patient has a bioprosthetic aortic valve s/p TAVR. No significant bioprosthetic  valve stenosis. There was no significant regurgitation. Mean gradient (S): 9 mm Hg. - Mitral valve: Moderately calcified annulus. There was no significant regurgitation. - Right ventricle: The cavity size was normal. Systolic function was normal. - Pulmonary arteries: No complete TR doppler jet so unable to estimate PA systolic pressure. - Inferior vena cava: The vessel was normal in size. The respirophasic diameter changes were in the normal range (>= 50%), consistent with normal central venous pressure. Impressions: - Normal LV size with mild LV hypertrophy. EF 55-60%. Normal RV size and systolic function. Bioprosthetic aortic valve s/p TAVR. The valve functions normally.   Disposition   Pt is being discharged home today in good condition.  Follow-up Plans & Appointments    Follow-up Information    Eileen Stanford, PA-C. Go on 09/13/2017.   Specialties:  Cardiology, Radiology Why:  @ 1:30 pm. Please arrive at least 5 minutes early.  Contact information: [address]  [place] [id number] [phone]            Discharge Medications     Medication List    STOP taking these medications   PRILOSEC OTC 20 MG tablet Generic drug:  omeprazole     TAKE these medications   acetaminophen 500 MG tablet Commonly known as:  TYLENOL Take 1,000 mg by mouth every 6 (six) hours as needed for mild pain.   amitriptyline 10 MG tablet Commonly known as:  ELAVIL TAKE ONE TABLET BY MOUTH AT BEDTIME   amLODipine 5 MG tablet Commonly known as:  NORVASC TAKE 1 TABLET (5 MG TOTAL) BY MOUTH DAILY.   aspirin 81 MG tablet Take 81 mg by mouth at bedtime.   atorvastatin 40 MG tablet Commonly known as:  LIPITOR Take 1 tablet (40 mg total) by mouth daily. What changed:  when to take this   carvedilol 25 MG tablet Commonly known as:  COREG TAKE 1 TABLET BY MOUTH 2 TIMES DAILY WITH A MEAL.   clopidogrel 75 MG tablet Commonly known as:   PLAVIX Take 1 tablet (75 mg total) by mouth daily with breakfast.   diphenhydrAMINE 25 mg capsule Commonly known as:  BENADRYL Take 25 mg by mouth daily.   dorzolamide-timolol 22.3-6.8 MG/ML ophthalmic solution Commonly known as:  COSOPT Place 1 drop into both eyes 2 (two) times daily.   FIFTY50 GLUCOSE METER 2.0 w/Device Kit Use as directed.   glucose blood test strip Use three test strips daily as instructed.   insulin aspart 100 UNIT/ML injection Commonly known as:  novoLOG Inject 10-15 Units into the skin 2 (two) times daily before a meal. Takes 10 units before breakfast and 15 units before dinner.   insulin detemir 100 UNIT/ML injection Commonly known as:  LEVEMIR Inject 0.18 mLs (18 Units total) into the skin 2 (two) times daily. What changed:  how much to take  additional instructions   latanoprost 0.005 % ophthalmic solution Commonly known as:  XALATAN Place 1 drop into both eyes at bedtime.   levothyroxine 100 MCG tablet Commonly known as:  SYNTHROID, LEVOTHROID Take 1 tablet (100 mcg total) by mouth daily. Take early morning EMPTY stomach.   losartan 100 MG tablet Commonly known as:  COZAAR TAKE 1 TABLET BY MOUTH  EVERY DAY What changed:  See the new instructions.   metFORMIN 1000 MG tablet Commonly known as:  GLUCOPHAGE Take 2,000 mg by mouth daily after supper. Notes to patient:  Okay to resume tonight 09/06/17   pantoprazole 40 MG tablet Commonly known as:  PROTONIX Take 1 tablet (40 mg total) by mouth daily at 12 noon.         Outstanding Labs/Studies   None   Duration of Discharge Encounter   Greater than 30 minutes including physician time.  Signed, Angelena Form PA-C 09/06/2017, 8:45 AM

## 2017-09-06 NOTE — Discharge Instructions (Signed)
We have switched your Prilosec to protonix which has been called into your pharmacy. This is because prilosec has a potential interaction with plavix and could make it less effective.   ACTIVITY AND EXERCISE  Daily activity and exercise are an important part of your recovery. People recover at different rates depending on their general health and type of valve procedure.  Most people recovering from TAVR feel better relatively quickly   No lifting, pushing, pulling more than 10 pounds (examples to avoid: groceries, vacuuming, gardening, golfing):             - For one week with a procedure through the groin.             - For six weeks for procedures through the chest wall.             - For three months for procedures through the breast-bone. NOTE: You will typically see one of our providers 7-10 days after your procedure to discuss [place] the above activities.    DRIVING  Do not drive for until you are seen for follow up and cleared by a provider.  If you have been told by your doctor in the past that you may not drive, you must talk with him/her before you begin driving again.   DRESSING  Groin site: you may leave the clear dressing over the site for up to one week or until it falls off.   HYGIENE  If you had a femoral (leg) procedure, you may take a shower when you return home. After the shower, pat the site dry. Do NOT use powder, oils or lotions in your groin area until the site has completely healed.  If you had a chest procedure, you may shower when you return home unless specifically instructed not to by your discharging practitioner.             - DO NOT scrub incision; pat dry with a towel             - DO NOT apply any lotions, oils, powders to the incision             - No tub baths / swimming for at least 2 weeks.  If you notice any fevers, chills, increased pain, swelling, bleeding or pus, please contact your doctor.  ADDITIONAL INFORMATION  If  you are going to have an upcoming dental procedure, please contact our office as you will require antibiotics ahead of time to prevent infection on your heart valve.

## 2017-09-06 NOTE — Progress Notes (Signed)
[place] VALVE TEAM  Patient Name: Stacey Pierce Date of Encounter: 09/06/2017  Primary Cardiologist: Dr. Nehemiah Massed / Dr. Burt Knack (TAVR)  Hospital Problem List     Principal Problem:   S/P TAVR (transcatheter aortic valve replacement) Active Problems:   Aortic stenosis   Benign essential HTN   Back pain, chronic   Arteriosclerosis of coronary artery   Diabetes (HCC)   Chronic kidney disease (CKD), stage III (moderate) (HCC)   Aortic stenosis, severe     Subjective   Feeling good. Breathing improved when up walking around. Wants to know when she can go home.   Inpatient Medications    Scheduled Meds: . amitriptyline  10 mg Oral QHS  . amLODipine  5 mg Oral Daily  . aspirin EC  81 mg Oral QHS  . atorvastatin  40 mg Oral QHS  . carvedilol  6.25 mg Oral BID WC  . clopidogrel  75 mg Oral Q breakfast  . dorzolamide-timolol  1 drop Both Eyes BID  . insulin aspart  0-24 Units Subcutaneous TID AC & HS  . insulin detemir  18 Units Subcutaneous BID  . latanoprost  1 drop Both Eyes QHS  . levothyroxine  100 mcg Oral QAC breakfast  . pantoprazole  40 mg Oral Q1200  . sodium chloride flush  3 mL Intravenous Q12H   Continuous Infusions: . sodium chloride     PRN Meds: sodium chloride, metoprolol tartrate, ondansetron (ZOFRAN) IV, sodium chloride flush, traMADol   Vital Signs    Vitals:   09/06/17 0000 09/06/17 0100 09/06/17 0200 09/06/17 0500  BP: (!) 182/84 (!) 167/56 (!) 153/51 (!) 152/56  Pulse:      Resp: 18 (!) 22 (!) 21 16  Temp:    98.7 F (37.1 C)  TempSrc:    Oral  SpO2:    98%  Weight:    191 lb 1.6 oz (86.7 kg)  Height:        Intake/Output Summary (Last 24 hours) at 09/06/17 0803 Last data filed at 09/06/17 0549  Gross per 24 hour  Intake             1230 ml  Output              350 ml  Net              880 ml   Filed Weights   09/05/17 0500 09/05/17 2300 09/06/17 0500  Weight: 193 lb 12.6 oz (87.9 kg)  193 lb 12.6 oz (87.9 kg) 191 lb 1.6 oz (86.7 kg)    Physical Exam   GEN: Well nourished, well developed, in no acute distress. obese.  HEENT: Grossly normal.  Neck: Supple, no JVD, carotid bruits, or masses. Cardiac: RRR, no murmurs, rubs, or gallops. No clubbing, cyanosis, edema.  Radials/DP/PT 2+ and equal bilaterally.  Respiratory:  Respirations regular and unlabored, clear to auscultation bilaterally. GI: Soft, nontender, nondistended, BS + x 4. MS: no deformity or atrophy. Skin: warm and dry, no rash. Groin sites without erythema or ecchymosis Neuro:  Strength and sensation are intact. Psych: AAOx3.  Normal affect.  Labs    CBC  Recent Labs  09/04/17 0910 09/05/17 0431  WBC  --  12.5*  HGB 8.8* 9.0*  HCT 26.0* 27.3*  MCV  --  86.4  PLT  --  256   Basic Metabolic Panel  Recent Labs  09/04/17 0910 09/05/17 0431  NA 139 135  K 4.9  4.7  CL 106 104  CO2  --  22  GLUCOSE 244* 260*  BUN 22* 18  CREATININE 1.20* 1.16*  CALCIUM  --  9.0  MG  --  1.3*    Telemetry    NSR - Personally Reviewed  ECG    NSR, ILBBB HR 70 - Personally Reviewed  Radiology    Dg Chest Port 1 View  Result Date: 09/04/2017 CLINICAL DATA:  Status post transcatheter aortic valve replacement EXAM: PORTABLE CHEST 1 VIEW COMPARISON:  08/30/2017 FINDINGS: Changes of recent aortic valve replacement are noted. Aortic calcifications are seen. Cardiac shadow is mildly enlarged accentuated by the portable technique. Right jugular central line is seen. No pneumothorax is noted. No focal infiltrate is seen. No bony abnormality is noted. IMPRESSION: Postoperative change as described.  No acute abnormality seen. Electronically Signed   By: Inez Catalina M.D.   On: 09/04/2017 12:15    Cardiac Studies   TAVR OPERATIVE NOTE   Date of Procedure:                09/04/2017  Preoperative Diagnosis:      Severe Aortic Stenosis  Procedure:        Transcatheter Aortic Valve Replacement -  Percutaneous Right Transfemoral Approach             Medtronic CoreValve Evolut Pro (size 26 mm, serial # [id number])            Pre-operative Echo Findings: ? Severe aortic stenosis ? Normal left ventricular systolic function  Post-operative Echo Findings: ? No paravalvular leak ? Normal left ventricular systolic function   Post operative echo 09/05/17 Left ventricle: The cavity size was normal. Wall thickness was   increased in a pattern of mild LVH. Systolic function was normal.   The estimated ejection fraction was in the range of 55% to 60%.   Wall motion was normal; there were no regional wall motion   abnormalities. Doppler parameters are consistent with abnormal   left ventricular relaxation (grade 1 diastolic dysfunction). - Aortic valve: The patient has a bioprosthetic aortic valve s/p   TAVR. No significant bioprosthetic valve stenosis. There was no   significant regurgitation. Mean gradient (S): 9 mm Hg. - Mitral valve: Moderately calcified annulus. There was no   significant regurgitation. - Right ventricle: The cavity size was normal. Systolic function   was normal. - Pulmonary arteries: No complete TR doppler jet so unable to   estimate PA systolic pressure. - Inferior vena cava: The vessel was normal in size. The   respirophasic diameter changes were in the normal range (>= 50%),   consistent with normal central venous pressure. Impressions: - Normal LV size with mild LV hypertrophy. EF 55-60%. Normal RV   size and systolic function. Bioprosthetic aortic valve s/p TAVR.   The valve functions normally.  Patient Profile     Stacey Pierce is a 76 y.o. female with a history of HTN, HLD, hypothyroidism, obesity, IDDM, CKD and severe symptomatic aortic stenosis who presented to [facility] on 09/04/17 for planned TAVR.    Assessment & Plan    Severe aortic stenosis: s/p successful TAVR with a 29m Medtronic Evolut Pro via R TF approach under MAC on 09/04/17. Initial ECG  with new LBBB that nearly resolved on subsequent ECGs. Groin sites stable. Continue ASA/plavix. Post operative echo 10/3 showed good valve placement with mean gradient of 958mHg and no PVL. She will walk with cardiac rehab today and then plan for  discharge home. I will see her back in 1 week for TOC and then 1 month with an echo.   HTN: BP elevated. Will resume home Losartan 1149m daily and increase Coreg to 12.568mBID.  Continue amlodipine 49m46maily  HLD: resume home statin therapy.  IDDM: HgA1c 7.5. Metformin being held 48 hours after surgery/contrast exposure. Continue SSI  Hypothyroidism: resume Synthroid  CKD: creat remains stable.   GERD: will switch prilosec to protonix given potential interaction with plavix  Signed, KatAngelena FormA-C  09/06/2017, 8:03 AM  Pager [phone] have seen and examined the patient and agree with the assessment and plan as outlined.  Doing great.  Resume ARB and increase Coreg.  D/C home today.  ClaRexene AlbertsD 09/06/2017 8:52 AM

## 2017-09-06 NOTE — Progress Notes (Signed)
09/06/2017 1315 Discharge AVS meds taken today and those due this evening reviewed.  Follow-up appointments and when to call md reviewed.  D/C IV and TELE.  Questions and concerns addressed.   D/C home per orders. [place]

## 2017-09-07 ENCOUNTER — Telehealth: Payer: Self-pay | Admitting: Physician Assistant

## 2017-09-07 NOTE — Telephone Encounter (Signed)
    Patient contacted regarding discharge from hospital on 09/06/17.  She did not answer but I was able to leave a message to remind her of her appointment.  Patient understands discharge instructions? n/a Patient understands medications and regiment? n/a Patient understands to bring all medications to this visit? n/a   Angelena Form PA-C  [facility]

## 2017-09-07 NOTE — Consult Note (Signed)
           [facility] CM Primary Care Navigator  09/07/2017  Stacey Pierce [birth year] [id number]   Wentto seepatient at the bedsideto identify possible discharge needs but she was already discharged per staff report.  Patient was discharged home yesterday.  Primary care provider's office called (spoke to [facility]) to notify of patient's discharge, need for post hospital follow-up and transition of care. Notified of patient's health issues needing follow-up.  Made aware to refer patient to [facility] care management if deemed necessary and appropriate for services.   For questions, please contact:  Dannielle Huh, BSN, RN- [facility] Primary Care Navigator  Telephone: [phone] [name]

## 2017-09-08 MED FILL — Magnesium Sulfate Inj 50%: INTRAMUSCULAR | Qty: 10 | Status: AC

## 2017-09-08 MED FILL — Sodium Chloride IV Soln 0.9%: INTRAVENOUS | Qty: 250 | Status: AC

## 2017-09-08 MED FILL — Phenylephrine HCl Inj 10 MG/ML: INTRAMUSCULAR | Qty: 2 | Status: AC

## 2017-09-08 MED FILL — Heparin Sodium (Porcine) Inj 1000 Unit/ML: INTRAMUSCULAR | Qty: 30 | Status: AC

## 2017-09-08 MED FILL — Potassium Chloride Inj 2 mEq/ML: INTRAVENOUS | Qty: 40 | Status: AC

## 2017-09-10 ENCOUNTER — Telehealth: Payer: Self-pay | Admitting: Physician Assistant

## 2017-09-10 NOTE — Telephone Encounter (Signed)
    Patient contacted regarding discharge from hospital on 09/06/17.   Patient understands to follow up with provider Angelena Form on 10/11 at 1:30pm at [facility]. Patient understands discharge instructions? yes Patient understands medications and regiment?  yes Patient understands to bring all medications to this visit? Yes  Angelena Form PA-C  [facility]

## 2017-09-11 NOTE — Progress Notes (Signed)
Cardiology Office Note    Date:  09/13/2017   ID:  Stacey, Pierce [birth year], MRN [id number]  PCP:  Jerrol Banana., MD  Cardiologist:  Dr. Nehemiah Massed / Dr. Burt Knack (TAVR)  CC: 1 week follow up s/p TAVR  History of Present Illness:  Stacey Pierce is a 76 y.o. female with a history of HTN, HLD, hypothyroidism, obesity, IDDM, CKD and severe aortic stenosis s/p TAVR (09/04/17) who presents to clinic for post hospital follow up.   In 05/2015 she had asymptomatic moderate aortic stenosis with normal LV systolic function and was followed. She was seen by Dr. Nehemiah Massed in 04/2017 and an echo then showed progression to severe AS with a mean gradient of 35 mm Hg and a peak of 58 mm Hg. LVEF remained 60%. There was moderate AI and MR. She reports having slowed down over the past year with exertional shortness of breath and fatigue occurring with bending over or walking any distance. She has has some dizziness but no syncope. She denies chest pain or pressure but has had some pain in her left shoulder radiating down the left arm. She underwent cath on 08/02/2017 showing non-obstructive CAD and severe AS with a mean gradient of 32 mm Hg and normal EF.  She underwent successful TAVR with a 68m Medtronic Evolut Pro via R TF approach under MAC on 09/04/17. Initial ECG with new LBBB. Post operative echo 10/3 showed good valve placement with mean gradient of 952mHg and no PVL. BP was elevated and home meds were resumed. She was discharged on ASA/plavix.   Today she presents to clinic for follow up. She has had some mild SOB and some coughing when laying flat. She just feels worn out and thinks it may be due the flu shot she was given during her admission. No CP or SOB. No LE edema, orthopnea or PND. No dizziness or syncope. No blood in stool or urine. No palpitations.    Past Medical History:  Diagnosis Date  . Aortic stenosis, severe    a. 09/2017: s/p TAVR  . CKD (chronic kidney disease)   .  Diabetes mellitus, insulin dependent (IDDM), uncontrolled ([facility]  . DR (diabetic retinopathy) ([facility]  . GERD (gastroesophageal reflux disease)   . Glaucoma   . Hyperlipidemia   . Hypertension   . Hypothyroidism   . Obesity   . S/P TAVR (transcatheter aortic valve replacement) 09/04/2017   26 mm Medtronic Evolut Pro transcatheter heart valve placed via percutaneous right transfemoral approach     Past Surgical History:  Procedure Laterality Date  . CATARACT EXTRACTION     left 2010  . CATARACT EXTRACTION W/PHACO Right 10/13/2015   Procedure: CATARACT EXTRACTION PHACO AND INTRAOCULAR LENS PLACEMENT (IOC);  Surgeon: ChLeandrew KoyanagiMD;  [facility] Service: Ophthalmology;  Laterality: Right;  DIABETIC - insulin and oral meds MALYUGIN SHUGARCAINE  . CHOLECYSTECTOMY     1967  . DILATION AND CURETTAGE OF UTERUS    . EYE SURGERY    . LAPAROSCOPIC TUBAL LIGATION    . RIGHT/LEFT HEART CATH AND CORONARY ANGIOGRAPHY N/A 08/02/2017   Procedure: RIGHT/LEFT HEART CATH AND CORONARY ANGIOGRAPHY;  Surgeon: KoCorey SkainsMD;  [facility];  Service: Cardiovascular;  Laterality: N/A;  . TEE WITHOUT CARDIOVERSION N/A 09/04/2017   Procedure: TRANSESOPHAGEAL ECHOCARDIOGRAM (TEE);  Surgeon: CoSherren MochaMD;  [facility] Service: Open Heart Surgery;  Laterality: N/A;  . TONSILLECTOMY  1974  . TRANSCATHETER AORTIC VALVE REPLACEMENT, TRANSFEMORAL N/A 09/04/2017   Procedure: TRANSCATHETER AORTIC VALVE REPLACEMENT, TRANSFEMORAL;  Surgeon: Sherren Mocha, MD;  Location: [place];  Service: Open Heart Surgery;  Laterality: N/A;  . TUBAL LIGATION      Current Medications: Outpatient Medications Prior to Visit  Medication Sig Dispense Refill  . acetaminophen (TYLENOL) 500 MG tablet Take 1,000 mg by mouth every 6 (six) hours as needed for mild pain.    Marland Kitchen amitriptyline (ELAVIL) 10 MG tablet TAKE ONE TABLET BY MOUTH AT BEDTIME 90 tablet 3  . amLODipine  (NORVASC) 5 MG tablet TAKE 1 TABLET (5 MG TOTAL) BY MOUTH DAILY. 90 tablet 3  . aspirin 81 MG tablet Take 81 mg by mouth at bedtime.     Marland Kitchen atorvastatin (LIPITOR) 40 MG tablet Take 1 tablet (40 mg total) by mouth daily. (Patient taking differently: Take 40 mg by mouth at bedtime. ) 90 tablet 3  . Blood Glucose Monitoring Suppl (FIFTY50 GLUCOSE METER 2.0) w/Device KIT Use as directed.    . carvedilol (COREG) 25 MG tablet TAKE 1 TABLET BY MOUTH 2 TIMES DAILY WITH A MEAL. 180 tablet 3  . clopidogrel (PLAVIX) 75 MG tablet Take 1 tablet (75 mg total) by mouth daily with breakfast. 30 tablet 6  . diphenhydrAMINE (BENADRYL) 25 mg capsule Take 25 mg by mouth daily.    . dorzolamide-timolol (COSOPT) 22.3-6.8 MG/ML ophthalmic solution Place 1 drop into both eyes 2 (two) times daily.  5  . glucose blood test strip Use three test strips daily as instructed.    . insulin aspart (NOVOLOG) 100 UNIT/ML injection Inject 10-15 Units into the skin 2 (two) times daily before a meal. Takes 10 units before breakfast and 15 units before dinner.    . insulin detemir (LEVEMIR) 100 UNIT/ML injection Inject 0.18 mLs (18 Units total) into the skin 2 (two) times daily. (Patient taking differently: Inject 20-50 Units into the skin 2 (two) times daily. 20 units in the morning 50 units in the evening) 10 mL 11  . latanoprost (XALATAN) 0.005 % ophthalmic solution Place 1 drop into both eyes at bedtime.     Marland Kitchen levothyroxine (SYNTHROID, LEVOTHROID) 100 MCG tablet Take 1 tablet (100 mcg total) by mouth daily. Take early morning EMPTY stomach. 90 tablet 3  . losartan (COZAAR) 100 MG tablet TAKE 1 TABLET BY MOUTH EVERY DAY (Patient taking differently: TAKE 1 TABLET BY MOUTH AT BEDTIME) 90 tablet 3  . metFORMIN (GLUCOPHAGE) 1000 MG tablet Take 2,000 mg by mouth daily after supper.    . pantoprazole (PROTONIX) 40 MG tablet Take 1 tablet (40 mg total) by mouth daily at 12 noon. 90 tablet 3   No facility-administered medications prior to  visit.      Allergies:   Codeine; Erythromycin; Penicillins; and Sulfa antibiotics   Social History   Social History  . Marital status: Married    Spouse name: N/A  . Number of children: N/A  . Years of education: N/A   Social History Main Topics  . Smoking status: Never Smoker  . Smokeless tobacco: Never Used  . Alcohol use No  . Drug use: No  . Sexual activity: Not Asked   Other Topics Concern  . None   Social History Narrative  . None     Family History:  The patient's family history includes Alzheimer's disease in her mother; Breast cancer in her sister; Colon polyps in her mother and sister; Coronary artery disease in her father;  Diabetes in her father, sister, sister, sister, and sister; Heart attack in her father; Hypertension in her mother.      ROS:   Please see the history of present illness.    ROS All other systems reviewed and are negative.   PHYSICAL EXAM:   VS:  BP 118/60   Pulse 64   Ht _0  (1.651 m)   Wt 192 lb 6.4 oz (87.3 kg)   SpO2 97%   BMI 32.02 kg/m    GEN: Well nourished, well developed, in no acute distress, obese HEENT: normal  Neck: no JVD, carotid bruits, or masses Cardiac: RRR; soft flow murmur, rubs, or gallops,no edema  Respiratory:  clear to auscultation bilaterally, normal work of breathing GI: soft, nontender, nondistended, + BS MS: no deformity or atrophy  Skin: warm and dry, no rash, groin site stable.  Neuro:  Alert and Oriented x 3, Strength and sensation are intact Psych: euthymic mood, full affect   Wt Readings from Last 3 Encounters:  09/13/17 192 lb 6.4 oz (87.3 kg)  09/06/17 191 lb 1.6 oz (86.7 kg)  08/31/17 192 lb (87.1 kg)      Studies/Labs Reviewed:   EKG:  EKG is ordered today.  The ekg ordered today demonstrates sinus with 1st deg AV block, LBBB  Recent Labs: 11/07/2016: TSH 2.830 08/30/2017: ALT 14 09/05/2017: Magnesium 1.3 09/06/2017: BUN 20; Creatinine, Ser 1.41; Hemoglobin 9.1; Platelets 198;  Potassium 4.1; Sodium 135   Lipid Panel    Component Value Date/Time   CHOL 152 11/07/2016 0911   TRIG 198 (H) 11/07/2016 0911   HDL 43 11/07/2016 0911   LDLCALC 69 11/07/2016 0911    Additional studies/ records that were reviewed today include:  TAVR OPERATIVE NOTE   Date of Procedure:09/04/2017  Preoperative Diagnosis:Severe Aortic Stenosis  Procedure:   Transcatheter Aortic Valve Replacement - Percutaneous RightTransfemoral Approach Medtronic CoreValve Evolut Pro (size 72m, serial # [id number]   Pre-operative Echo Findings: ? Severe aortic stenosis ? Normalleft ventricular systolic function  Post-operative Echo Findings: ? Noparavalvular leak ? Normalleft ventricular systolic function  ________________________   Post operative echo 09/05/17 Left ventricle: The cavity size was normal. Wall thickness was increased in a pattern of mild LVH. Systolic function was normal. The estimated ejection fraction was in the range of 55% to 60%. Wall motion was normal; there were no regional wall motion abnormalities. Doppler parameters are consistent with abnormal left ventricular relaxation (grade 1 diastolic dysfunction). - Aortic valve: The patient has a bioprosthetic aortic valve s/p TAVR. No significant bioprosthetic valve stenosis. There was no significant regurgitation. Mean gradient (S): 9 mm Hg. - Mitral valve: Moderately calcified annulus. There was no significant regurgitation. - Right ventricle: The cavity size was normal. Systolic function was normal. - Pulmonary arteries: No complete TR doppler jet so unable to estimate PA systolic pressure. - Inferior vena cava: The vessel was normal in size. The respirophasic diameter changes were in the normal range (>= 50%), consistent with normal central venous pressure. Impressions: - Normal LV size with mild LV hypertrophy. EF 55-60%. Normal  RV size and systolic function. Bioprosthetic aortic valve s/p TAVR. The valve functions normally.   ASSESSMENT & PLAN:   Severe AS s/p TAVR: groin site stable. She can't tell a big difference in her breathing. She will return in 3 weeks for an echo and follow up. Continue ASA/plavix. She has had a cough when supine and low energy. Will check some lab work including a BMET, BNP and CBC.  I encouraged her to enroll in cardiac rehab and walk at least 5 minutes a day and increase time incrementally. SBE prophylaxis addressed. She will require clindamycin given a penicillin allergy.   HTN: BP well controlled today.   HLD: continue statin   IDDM: continue current regimen   Hypothyroidism: continue synthrid  CKD: check BMET today  GERD: continue PPI   Medication Adjustments/Labs and Tests Ordered: Current medicines are reviewed at length with the patient today.  Concerns regarding medicines are outlined above.  Medication changes, Labs and Tests ordered today are listed in the Patient Instructions below. Patient Instructions  Medication Instructions:  AN RX FOR CLINDAMYCIN 300 MG TABLET WITH THE DIRECTIONS TO TAKE 2 TABLETS 1 HOUR BEFORE DENTAL WORK  Labwork: TODAY BMET, CBC, PRO BNP  Testing/Procedures: NONE ORDERED   Follow-Up: KEEP UPCOMING APPT 09/27/17 FOR ECHO AND KATY Gwynn Crossley, PAC  Any Other Special Instructions Will Be Listed Below (If Applicable).     If you need a refill on your cardiac medications before your next appointment, please call your pharmacy.      Signed, Angelena Form, PA-C  09/13/2017 3:54 PM    [facility] [address], [place], [place]  [address] Phone: [phone]; Fax: [phone]

## 2017-09-13 ENCOUNTER — Encounter: Payer: Self-pay | Admitting: Physician Assistant

## 2017-09-13 ENCOUNTER — Ambulatory Visit (INDEPENDENT_AMBULATORY_CARE_PROVIDER_SITE_OTHER): Payer: Medicare Other | Admitting: Physician Assistant

## 2017-09-13 VITALS — BP 118/60 | HR 64 | Ht 65.0 in | Wt 192.4 lb

## 2017-09-13 DIAGNOSIS — N189 Chronic kidney disease, unspecified: Secondary | ICD-10-CM | POA: Diagnosis not present

## 2017-09-13 DIAGNOSIS — Z952 Presence of prosthetic heart valve: Secondary | ICD-10-CM | POA: Diagnosis not present

## 2017-09-13 DIAGNOSIS — E119 Type 2 diabetes mellitus without complications: Secondary | ICD-10-CM | POA: Diagnosis not present

## 2017-09-13 DIAGNOSIS — E039 Hypothyroidism, unspecified: Secondary | ICD-10-CM | POA: Diagnosis not present

## 2017-09-13 DIAGNOSIS — Z794 Long term (current) use of insulin: Secondary | ICD-10-CM | POA: Diagnosis not present

## 2017-09-13 DIAGNOSIS — K219 Gastro-esophageal reflux disease without esophagitis: Secondary | ICD-10-CM | POA: Diagnosis not present

## 2017-09-13 DIAGNOSIS — E785 Hyperlipidemia, unspecified: Secondary | ICD-10-CM | POA: Diagnosis not present

## 2017-09-13 DIAGNOSIS — I10 Essential (primary) hypertension: Secondary | ICD-10-CM

## 2017-09-13 DIAGNOSIS — IMO0001 Reserved for inherently not codable concepts without codable children: Secondary | ICD-10-CM

## 2017-09-13 MED ORDER — CLINDAMYCIN HCL 300 MG PO CAPS: 600.0000 mg | ORAL_CAPSULE | ORAL | 6 refills | Status: DC

## 2017-09-13 NOTE — Patient Instructions (Signed)
Medication Instructions:  AN RX FOR CLINDAMYCIN 300 MG TABLET WITH THE DIRECTIONS TO TAKE 2 TABLETS 1 HOUR BEFORE DENTAL WORK  Labwork: TODAY BMET, CBC, PRO BNP  Testing/Procedures: NONE ORDERED   Follow-Up: KEEP UPCOMING APPT 09/27/17 FOR ECHO AND KATY THOMPSON, PAC  Any Other Special Instructions Will Be Listed Below (If Applicable).     If you need a refill on your cardiac medications before your next appointment, please call your pharmacy.

## 2017-09-14 ENCOUNTER — Encounter: Payer: Self-pay | Admitting: Thoracic Surgery (Cardiothoracic Vascular Surgery)

## 2017-09-14 ENCOUNTER — Telehealth: Payer: Self-pay | Admitting: *Deleted

## 2017-09-14 LAB — BASIC METABOLIC PANEL WITH GFR
BUN/Creatinine Ratio: 18 (ref 12–28)
BUN: 24 mg/dL (ref 8–27)
CO2: 18 mmol/L — ABNORMAL LOW (ref 20–29)
Calcium: 9.2 mg/dL (ref 8.7–10.3)
Chloride: 104 mmol/L (ref 96–106)
Creatinine, Ser: 1.36 mg/dL — ABNORMAL HIGH (ref 0.57–1.00)
GFR calc Af Amer: 43 mL/min/1.73 — ABNORMAL LOW
GFR calc non Af Amer: 38 mL/min/1.73 — ABNORMAL LOW
Glucose: 147 mg/dL — ABNORMAL HIGH (ref 65–99)
Potassium: 4.9 mmol/L (ref 3.5–5.2)
Sodium: 138 mmol/L (ref 134–144)

## 2017-09-14 LAB — CBC
HEMOGLOBIN: 9.9 g/dL — AB (ref 11.1–15.9)
Hematocrit: 30.6 % — ABNORMAL LOW (ref 34.0–46.6)
MCH: 28.1 pg (ref 26.6–33.0)
MCHC: 32.4 g/dL (ref 31.5–35.7)
MCV: 87 fL (ref 79–97)
PLATELETS: 285 10*3/uL (ref 150–379)
RBC: 3.52 x10E6/uL — AB (ref 3.77–5.28)
RDW: 13.8 % (ref 12.3–15.4)
WBC: 7.9 10*3/uL (ref 3.4–10.8)

## 2017-09-14 LAB — PRO B NATRIURETIC PEPTIDE: NT-Pro BNP: 349 pg/mL (ref 0–738)

## 2017-09-14 NOTE — Telephone Encounter (Signed)
-----   Message from Eileen Stanford, PA-C sent at 09/14/2017  8:04 AM EDT ----- All labs look great. No evidence of extra fluid on board. No changes in plan

## 2017-09-14 NOTE — Telephone Encounter (Signed)
Left message to go over lab results.  

## 2017-09-14 NOTE — Telephone Encounter (Signed)
Pt has been notified of lab results by phone with verbal understanding. Pt thanked me for my call.  

## 2017-09-14 NOTE — Telephone Encounter (Signed)
Stacey Pierce is returning your call about her lab results . Thanks

## 2017-09-19 NOTE — Addendum Note (Signed)
Addended by: Patterson Hammersmith A on: 09/19/2017 09:02 AM   Modules accepted: Orders

## 2017-09-20 DIAGNOSIS — H401132 Primary open-angle glaucoma, bilateral, moderate stage: Secondary | ICD-10-CM | POA: Diagnosis not present
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
## 2017-09-27 ENCOUNTER — Other Ambulatory Visit: Payer: Self-pay

## 2017-09-27 ENCOUNTER — Encounter: Payer: Self-pay | Admitting: Physician Assistant

## 2017-09-27 ENCOUNTER — Ambulatory Visit (INDEPENDENT_AMBULATORY_CARE_PROVIDER_SITE_OTHER): Payer: Medicare Other | Admitting: Physician Assistant

## 2017-09-27 ENCOUNTER — Ambulatory Visit (HOSPITAL_COMMUNITY): Payer: Medicare Other | Attending: Cardiovascular Disease

## 2017-09-27 VITALS — BP 112/44 | HR 74 | Ht 65.0 in | Wt 192.4 lb

## 2017-09-27 DIAGNOSIS — Z794 Long term (current) use of insulin: Secondary | ICD-10-CM | POA: Diagnosis not present

## 2017-09-27 DIAGNOSIS — E119 Type 2 diabetes mellitus without complications: Secondary | ICD-10-CM | POA: Diagnosis not present

## 2017-09-27 DIAGNOSIS — Z952 Presence of prosthetic heart valve: Secondary | ICD-10-CM | POA: Diagnosis not present

## 2017-09-27 DIAGNOSIS — I10 Essential (primary) hypertension: Secondary | ICD-10-CM | POA: Diagnosis not present

## 2017-09-27 DIAGNOSIS — I350 Nonrheumatic aortic (valve) stenosis: Secondary | ICD-10-CM | POA: Diagnosis not present

## 2017-09-27 DIAGNOSIS — E039 Hypothyroidism, unspecified: Secondary | ICD-10-CM | POA: Diagnosis not present

## 2017-09-27 DIAGNOSIS — E785 Hyperlipidemia, unspecified: Secondary | ICD-10-CM

## 2017-09-27 DIAGNOSIS — IMO0001 Reserved for inherently not codable concepts without codable children: Secondary | ICD-10-CM

## 2017-09-27 DIAGNOSIS — N189 Chronic kidney disease, unspecified: Secondary | ICD-10-CM | POA: Diagnosis not present

## 2017-09-27 LAB — ECHOCARDIOGRAM COMPLETE
HEIGHTINCHES: 65 in
Weight: 3078.4 oz

## 2017-09-27 MED ORDER — AMLODIPINE BESYLATE 2.5 MG PO TABS: 2.5000 mg | ORAL_TABLET | Freq: Every day | ORAL | Status: DC

## 2017-09-27 NOTE — Patient Instructions (Addendum)
Medication Instructions:  Your physician has recommended you make the following change in your medication:  REDUCE Amlodipine to 2.5mg  daily  Labwork: None ordered  Testing/Procedures: Echo today  Follow-Up: Follow up with your Cardiologist Dr. Nehemiah Massed as planned  Your physician has requested that you have an echocardiogram. Echocardiography is a painless test that uses sound waves to create images of your heart. It provides your doctor with information about the size and shape of your heart and how well your heart's chambers and valves are working. This procedure takes approximately one hour. There are no restrictions for this procedure.( To be scheduled September 2019)  Your physician wants you to follow-up in: 1 year with Stacey Leitz, PA You will receive a reminder letter in the mail two months in advance. If you don't receive a letter, please call our office to schedule the follow-up appointment.   Any Other Special Instructions Will Be Listed Below (If Applicable).     If you need a refill on your cardiac medications before your next appointment, please call your pharmacy.

## 2017-10-02 ENCOUNTER — Encounter: Payer: Self-pay | Admitting: Thoracic Surgery (Cardiothoracic Vascular Surgery)

## 2017-10-24 DIAGNOSIS — E1122 Type 2 diabetes mellitus with diabetic chronic kidney disease: Secondary | ICD-10-CM | POA: Diagnosis not present

## 2017-10-24 DIAGNOSIS — N183 Chronic kidney disease, stage 3 (moderate): Secondary | ICD-10-CM | POA: Diagnosis not present

## 2017-10-24 DIAGNOSIS — Z794 Long term (current) use of insulin: Secondary | ICD-10-CM | POA: Diagnosis not present

## 2017-11-02 DIAGNOSIS — E1129 Type 2 diabetes mellitus with other diabetic kidney complication: Secondary | ICD-10-CM | POA: Diagnosis not present

## 2017-11-02 DIAGNOSIS — E1165 Type 2 diabetes mellitus with hyperglycemia: Secondary | ICD-10-CM | POA: Diagnosis not present

## 2017-11-02 DIAGNOSIS — E11319 Type 2 diabetes mellitus with unspecified diabetic retinopathy without macular edema: Secondary | ICD-10-CM | POA: Diagnosis not present

## 2017-11-02 DIAGNOSIS — Z794 Long term (current) use of insulin: Secondary | ICD-10-CM | POA: Diagnosis not present

## 2017-11-02 DIAGNOSIS — E1122 Type 2 diabetes mellitus with diabetic chronic kidney disease: Secondary | ICD-10-CM | POA: Diagnosis not present

## 2017-11-14 ENCOUNTER — Other Ambulatory Visit: Payer: Self-pay | Admitting: Family Medicine

## 2017-11-15 ENCOUNTER — Ambulatory Visit (INDEPENDENT_AMBULATORY_CARE_PROVIDER_SITE_OTHER): Payer: Medicare Other | Admitting: Family Medicine

## 2017-11-15 ENCOUNTER — Other Ambulatory Visit: Payer: Self-pay

## 2017-11-15 VITALS — BP 138/60 | HR 64 | Temp 97.7°F | Resp 16 | Wt 195.0 lb

## 2017-11-15 DIAGNOSIS — M5137 Other intervertebral disc degeneration, lumbosacral region: Secondary | ICD-10-CM | POA: Diagnosis not present

## 2017-11-15 DIAGNOSIS — G8929 Other chronic pain: Secondary | ICD-10-CM

## 2017-11-15 DIAGNOSIS — E785 Hyperlipidemia, unspecified: Secondary | ICD-10-CM | POA: Diagnosis not present

## 2017-11-15 DIAGNOSIS — N183 Chronic kidney disease, stage 3 unspecified: Secondary | ICD-10-CM

## 2017-11-15 DIAGNOSIS — I10 Essential (primary) hypertension: Secondary | ICD-10-CM | POA: Diagnosis not present

## 2017-11-15 DIAGNOSIS — E039 Hypothyroidism, unspecified: Secondary | ICD-10-CM

## 2017-11-15 DIAGNOSIS — E118 Type 2 diabetes mellitus with unspecified complications: Secondary | ICD-10-CM | POA: Diagnosis not present

## 2017-11-15 DIAGNOSIS — Z6832 Body mass index (BMI) 32.0-32.9, adult: Secondary | ICD-10-CM

## 2017-11-15 DIAGNOSIS — M545 Low back pain: Secondary | ICD-10-CM | POA: Diagnosis not present

## 2017-11-15 DIAGNOSIS — I2510 Atherosclerotic heart disease of native coronary artery without angina pectoris: Secondary | ICD-10-CM

## 2017-11-15 DIAGNOSIS — E6609 Other obesity due to excess calories: Secondary | ICD-10-CM

## 2017-11-15 MED ORDER — METHYLPREDNISOLONE ACETATE 80 MG/ML IJ SUSP
80.0000 mg | Freq: Once | INTRAMUSCULAR | Status: AC
  Administered 2017-11-15: 80 mg via INTRAMUSCULAR

## 2017-11-15 NOTE — Progress Notes (Signed)
Stacey Pierce  MRN: [id number] DOB: Apr 09, 1941  Subjective:  HPI   The patient is a 76 year old female who presents for follow up of her chronic illness.  She was last seen in July for an acute visit and June for chronic illness.  She is followed by Dr Gabriel Carina for her diabetes. The patient has had aortic valve replacement since we last saw her and is recovering well.  She is currently on Plavix and Pantoprazole which are new medications.  The Pantoprazole took the place of Prilosec as the cardiologist did not want her on Prilosec.    Hypothyroidism-the patient is in need of refill on her medication.  She has not had her level checked since 11/2016 so we will get this today.    Hyperlipidemia-She is also due for her lipids to be checked.   Hypertension-the patient is currently on  Amlodipine , Carvedilol and Losartan.  He blood pressure has been stable on the last few visits.  The patient would like to get another shot of Depo Medrol for her arthritic hip pain.  She states she got one 3 months ago and another one 6 months ago.  She continues to have right hip pain.  She had imaging done 02/2017 and it revealed degenerative changes of the lumbar spine and both hip.  BP Readings from Last 3 Encounters:  11/15/17 138/60  09/27/17 (!) 112/44  09/13/17 118/60    Patient Active Problem List   Diagnosis Date Noted  . Obesity   . S/P TAVR (transcatheter aortic valve replacement) 09/04/2017  . Aortic stenosis, severe 07/30/2017  . Allergic rhinitis 08/26/2015  . Airway hyperreactivity 08/26/2015  . Back pain, chronic 08/26/2015  . Diabetes ([place]) 08/26/2015  . Degeneration of lumbar or lumbosacral intervertebral disc 08/26/2015  . Acid reflux 08/26/2015  . Glaucoma 08/26/2015  . HLD (hyperlipidemia) 08/26/2015  . Adiposity 08/26/2015  . Benign essential HTN 04/29/2015  . Arteriosclerosis of coronary artery 01/26/2015  . Retinopathy 11/19/2014  . Chronic kidney disease (CKD), stage III  (moderate) ([place]) 11/19/2014  . Adult hypothyroidism 02/17/2014    Past Medical History:  Diagnosis Date  . Aortic stenosis, severe    a. 09/2017: s/p TAVR  . CKD (chronic kidney disease)   . Diabetes mellitus, insulin dependent (IDDM), uncontrolled ([place])   . DR (diabetic retinopathy) ([place])   . GERD (gastroesophageal reflux disease)   . Glaucoma   . Hyperlipidemia   . Hypertension   . Hypothyroidism   . Obesity   . S/P TAVR (transcatheter aortic valve replacement) 09/04/2017   26 mm Medtronic Evolut Pro transcatheter heart valve placed via percutaneous right transfemoral approach     Social History   Socioeconomic History  . Marital status: Married    Spouse name: Not on file  . Number of children: Not on file  . Years of education: Not on file  . Highest education level: Not on file  Social Needs  . Financial resource strain: Not on file  . Food insecurity - worry: Not on file  . Food insecurity - inability: Not on file  . Transportation needs - medical: Not on file  . Transportation needs - non-medical: Not on file  Occupational History  . Not on file  Tobacco Use  . Smoking status: Never Smoker  . Smokeless tobacco: Never Used  Substance and Sexual Activity  . Alcohol use: No    Alcohol/week: 0.0 oz  . Drug use: No  . Sexual activity: Not on  file  Other Topics Concern  . Not on file  Social History Narrative  . Not on file    Outpatient Encounter Medications as of 11/15/2017  Medication Sig  . acetaminophen (TYLENOL) 500 MG tablet Take 1,000 mg by mouth every 6 (six) hours as needed for mild pain.  Marland Kitchen amitriptyline (ELAVIL) 10 MG tablet TAKE ONE TABLET BY MOUTH AT BEDTIME  . amLODipine (NORVASC) 2.5 MG tablet Take 1 tablet (2.5 mg total) by mouth daily.  Marland Kitchen aspirin 81 MG tablet Take 81 mg by mouth at bedtime.   Marland Kitchen atorvastatin (LIPITOR) 40 MG tablet Take 1 tablet (40 mg total) by mouth daily.  . Blood Glucose Monitoring Suppl (FIFTY50 GLUCOSE METER 2.0)  w/Device KIT Use as directed.  . carvedilol (COREG) 25 MG tablet TAKE 1 TABLET BY MOUTH 2 TIMES DAILY WITH A MEAL.  Marland Kitchen clopidogrel (PLAVIX) 75 MG tablet Take 1 tablet (75 mg total) by mouth daily with breakfast.  . DiphenhydrAMINE HCl ([employer] ALLERGY PO) Take 1 tablet by mouth daily.  . dorzolamide-timolol (COSOPT) 22.3-6.8 MG/ML ophthalmic solution Place 1 drop into both eyes 2 (two) times daily.  Marland Kitchen glucose blood test strip Use three test strips daily as instructed.  . insulin aspart (NOVOLOG) 100 UNIT/ML injection Inject 10-15 Units into the skin 2 (two) times daily before a meal. Takes 10 units before breakfast and 15 units before dinner.  . insulin detemir (LEVEMIR) 100 UNIT/ML injection Inject 20 units into the skin in the morning and 50 units at bedtime  . latanoprost (XALATAN) 0.005 % ophthalmic solution Place 1 drop into both eyes at bedtime.   Marland Kitchen levothyroxine (SYNTHROID, LEVOTHROID) 100 MCG tablet TAKE ONE TABLET BY MOUTH ONCE DAILY. TAKE EARLY MORNING ON AN EMPTY STOMACH  . losartan (COZAAR) 100 MG tablet TAKE 1 TABLET BY MOUTH EVERY DAY (Patient taking differently: TAKE 1 TABLET BY MOUTH AT BEDTIME)  . metFORMIN (GLUCOPHAGE) 1000 MG tablet Take 2,000 mg by mouth daily after supper.  . pantoprazole (PROTONIX) 40 MG tablet Take 1 tablet (40 mg total) by mouth daily at 12 noon.   No facility-administered encounter medications on file as of 11/15/2017.     Allergies  Allergen Reactions  . Azithromycin Rash    Other reaction(s): Unknown MYCIN GROUP   . Codeine Other (See Comments)    "COLIC"  . Erythromycin Rash  . Penicillins Rash    Has patient had a PCN reaction causing immediate rash, facial/tongue/throat swelling, SOB or lightheadedness with hypotension: No Has patient had a PCN reaction causing severe rash involving mucus membranes or skin necrosis: No Has patient had a PCN reaction that required hospitalization: No Has patient had a PCN reaction occurring within the last 10  years: No If all of the above answers are "NO", then may proceed with Cephalosporin use.   . Sulfa Antibiotics Other (See Comments)    "COLIC"    Review of Systems  Constitutional: Negative for fever and malaise/fatigue.  HENT: Negative.   Eyes: Negative.   Respiratory: Negative for cough, shortness of breath and wheezing.   Cardiovascular: Negative for chest pain, palpitations, orthopnea, claudication and leg swelling.  Gastrointestinal: Negative.   Genitourinary: Negative.   Musculoskeletal: Positive for joint pain. Negative for back pain, myalgias and neck pain.  Skin: Negative.   Neurological: Negative.  Negative for weakness.  Endo/Heme/Allergies: Negative.   Psychiatric/Behavioral: Negative.     Objective:  BP 138/60 (BP Location: Right Arm, Patient Position: Sitting, Cuff Size: Normal)   Pulse 64  Temp 97.7 F (36.5 C) (Oral)   Resp 16   Wt 195 lb (88.5 kg)   BMI 32.45 kg/m   Physical Exam  Constitutional: She is oriented to person, place, and time and well-developed, well-nourished, and in no distress.  HENT:  Head: Normocephalic and atraumatic.  Right Ear: External ear normal.  Left Ear: External ear normal.  Nose: Nose normal.  Mouth/Throat: Oropharynx is clear and moist.  Eyes: Conjunctivae are normal. No scleral icterus.  Neck: No thyromegaly present.  Cardiovascular: Normal rate, regular rhythm and normal heart sounds.  Pulmonary/Chest: Effort normal and breath sounds normal.  Abdominal: Soft.  Lymphadenopathy:    She has no cervical adenopathy.  Neurological: She is alert and oriented to person, place, and time.  Skin: Skin is warm and dry.  Psychiatric: Mood, memory, affect and judgment normal.    Assessment and Plan :   S/p TAVR (Aortic Valve) TIIDM HTN Obesity Hypothyroid HLD Check labs. OA/DDD Depomedrol--refer if this persists. Not a candidate for this more than yearly at this time. CKD  I have done the exam and reviewed the chart  and it is accurate to the best of my knowledge. Development worker, community has been used and  any errors in dictation or transcription are unintentional. Miguel Aschoff M.D. [facility]

## 2017-11-19 DIAGNOSIS — E785 Hyperlipidemia, unspecified: Secondary | ICD-10-CM | POA: Diagnosis not present

## 2017-11-19 DIAGNOSIS — E039 Hypothyroidism, unspecified: Secondary | ICD-10-CM | POA: Diagnosis not present

## 2017-11-19 LAB — LIPID PANEL
Cholesterol: 152 mg/dL (ref <200)
HDL: 49 mg/dL — AB (ref >50)
LDL Cholesterol (Calc): 82 mg/dL (calc)
NON-HDL CHOLESTEROL (CALC): 103 mg/dL (ref <130)
Total CHOL/HDL Ratio: 3.1 (calc) (ref <5.0)
Triglycerides: 115 mg/dL (ref <150)

## 2017-11-19 LAB — TSH: TSH: 3.38 mIU/L (ref 0.40–4.50)

## 2017-12-25 ENCOUNTER — Ambulatory Visit (INDEPENDENT_AMBULATORY_CARE_PROVIDER_SITE_OTHER): Payer: Medicare HMO | Admitting: Family Medicine

## 2017-12-25 ENCOUNTER — Other Ambulatory Visit: Payer: Self-pay

## 2017-12-25 VITALS — BP 146/50 | HR 64 | Temp 97.7°F | Resp 14

## 2017-12-25 DIAGNOSIS — R42 Dizziness and giddiness: Secondary | ICD-10-CM

## 2017-12-25 NOTE — Progress Notes (Signed)
Stacey Pierce  MRN: [id number] DOB: 1941-03-23  Subjective:  HPI   The patient reports being "swimmy headed" for several weeks.  She has had blurred vision for several days.  She reported that while she was bowling last week she felt so bad she thought she would pass. She has not gotten worse today but decided today that she wanted to get checked because she has to bowl tomorrow.   She spoke with Dr Alveria Apley office yesterday and they said they thought it was her blood sugar and they called Dr. Joycie Peek office.  When she spoke ot Dr Joycie Peek office they told her they wanted her to be seen before her March 21 visit there.  The earliest her staff said she could be seen was on March 5.  She did not make that appointment and thinks that Dr. Joycie Peek office was going to call her back with another appointment but they have not yet done so.   She came over here to have her BP checked and to see if we could see her.  She said when she had it checked last week at the pharmacy it was 130/49.  Earlier today when she had it checked it was 125/42.  She reports that her glucose last night was 130 and this morning it was 133.  The patient said her last eye exam was in April 2018 and she reports getting a good report that day.   It is of note that the patient had aortic valve replacement on 09/04/17.  Patient Active Problem List   Diagnosis Date Noted  . Obesity   . S/P TAVR (transcatheter aortic valve replacement) 09/04/2017  . Aortic stenosis, severe 07/30/2017  . Allergic rhinitis 08/26/2015  . Airway hyperreactivity 08/26/2015  . Back pain, chronic 08/26/2015  . Diabetes ([place]) 08/26/2015  . Degeneration of lumbar or lumbosacral intervertebral disc 08/26/2015  . Acid reflux 08/26/2015  . Glaucoma 08/26/2015  . HLD (hyperlipidemia) 08/26/2015  . Adiposity 08/26/2015  . Benign essential HTN 04/29/2015  . Arteriosclerosis of coronary artery 01/26/2015  . Retinopathy 11/19/2014  . Chronic kidney  disease (CKD), stage III (moderate) ([place]) 11/19/2014  . Adult hypothyroidism 02/17/2014    Past Medical History:  Diagnosis Date  . Aortic stenosis, severe    a. 09/2017: s/p TAVR  . CKD (chronic kidney disease)   . Diabetes mellitus, insulin dependent (IDDM), uncontrolled ([place])   . DR (diabetic retinopathy) ([place])   . GERD (gastroesophageal reflux disease)   . Glaucoma   . Hyperlipidemia   . Hypertension   . Hypothyroidism   . Obesity   . S/P TAVR (transcatheter aortic valve replacement) 09/04/2017   26 mm Medtronic Evolut Pro transcatheter heart valve placed via percutaneous right transfemoral approach     Social History   Socioeconomic History  . Marital status: Married    Spouse name: Not on file  . Number of children: Not on file  . Years of education: Not on file  . Highest education level: Not on file  Social Needs  . Financial resource strain: Not on file  . Food insecurity - worry: Not on file  . Food insecurity - inability: Not on file  . Transportation needs - medical: Not on file  . Transportation needs - non-medical: Not on file  Occupational History  . Not on file  Tobacco Use  . Smoking status: Never Smoker  . Smokeless tobacco: Never Used  Substance and Sexual Activity  . Alcohol use: No  Alcohol/week: 0.0 oz  . Drug use: No  . Sexual activity: Not on file  Other Topics Concern  . Not on file  Social History Narrative  . Not on file    Outpatient Encounter Medications as of 12/25/2017  Medication Sig  . acetaminophen (TYLENOL) 500 MG tablet Take 1,000 mg by mouth every 6 (six) hours as needed for mild pain.  Marland Kitchen amitriptyline (ELAVIL) 10 MG tablet TAKE ONE TABLET BY MOUTH AT BEDTIME  . amLODipine (NORVASC) 2.5 MG tablet Take 1 tablet (2.5 mg total) by mouth daily.  Marland Kitchen aspirin 81 MG tablet Take 81 mg by mouth at bedtime.   Marland Kitchen atorvastatin (LIPITOR) 40 MG tablet Take 1 tablet (40 mg total) by mouth daily.  . Blood Glucose Monitoring Suppl (FIFTY50  GLUCOSE METER 2.0) w/Device KIT Use as directed.  . carvedilol (COREG) 25 MG tablet TAKE 1 TABLET BY MOUTH 2 TIMES DAILY WITH A MEAL.  Marland Kitchen clopidogrel (PLAVIX) 75 MG tablet Take 1 tablet (75 mg total) by mouth daily with breakfast.  . DiphenhydrAMINE HCl ([facility] ALLERGY PO) Take 1 tablet by mouth daily.  . dorzolamide-timolol (COSOPT) 22.3-6.8 MG/ML ophthalmic solution Place 1 drop into both eyes 2 (two) times daily.  Marland Kitchen glucose blood test strip Use three test strips daily as instructed.  . insulin aspart (NOVOLOG) 100 UNIT/ML injection Inject 10-15 Units into the skin 2 (two) times daily before a meal. Takes 20 units before breakfast and 25 units before dinner.  . insulin detemir (LEVEMIR) 100 UNIT/ML injection Inject 20 units into the skin in the morning and 60 units at bedtime  . latanoprost (XALATAN) 0.005 % ophthalmic solution Place 1 drop into both eyes at bedtime.   Marland Kitchen levothyroxine (SYNTHROID, LEVOTHROID) 100 MCG tablet TAKE ONE TABLET BY MOUTH ONCE DAILY. TAKE EARLY MORNING ON AN EMPTY STOMACH  . losartan (COZAAR) 100 MG tablet TAKE 1 TABLET BY MOUTH EVERY DAY (Patient taking differently: TAKE 1 TABLET BY MOUTH AT BEDTIME)  . metFORMIN (GLUCOPHAGE) 1000 MG tablet Take 2,000 mg by mouth daily after supper.  . pantoprazole (PROTONIX) 40 MG tablet Take 1 tablet (40 mg total) by mouth daily at 12 noon.   No facility-administered encounter medications on file as of 12/25/2017.     Allergies  Allergen Reactions  . Azithromycin Rash    Other reaction(s): Unknown MYCIN GROUP   . Codeine Other (See Comments)    "COLIC"  . Erythromycin Rash  . Penicillins Rash    Has patient had a PCN reaction causing immediate rash, facial/tongue/throat swelling, SOB or lightheadedness with hypotension: No Has patient had a PCN reaction causing severe rash involving mucus membranes or skin necrosis: No Has patient had a PCN reaction that required hospitalization: No Has patient had a PCN reaction occurring  within the last 10 years: No If all of the above answers are "NO", then may proceed with Cephalosporin use.   . Sulfa Antibiotics Other (See Comments)    "COLIC"    Review of Systems  Constitutional: Negative.   HENT: Negative.   Eyes: Positive for blurred vision.  Respiratory: Negative.   Cardiovascular: Negative.   Gastrointestinal: Negative.   Neurological:       Some lightheaded sensation.    Objective:  BP (!) 146/50 (BP Location: Right Arm, Patient Position: Sitting, Cuff Size: Normal)   Pulse 64   Temp 97.7 F (36.5 C) (Oral)   Resp 14   SpO2 98%   Physical Exam  Constitutional: She is oriented to person,  place, and time and well-developed, well-nourished, and in no distress.  HENT:  Head: Normocephalic.  Right Ear: External ear normal.  Nose: Nose normal.  Mouth/Throat: Oropharynx is clear and moist.  Eyes: Conjunctivae are normal.  Neck: Neck supple. No thyromegaly present.  Cardiovascular: Normal rate.  History of 1st degree AV block with occasional PAC's on EKG in 09-20-17.  Pulmonary/Chest: Effort normal and breath sounds normal.  Abdominal: Soft. Bowel sounds are normal.  Lymphadenopathy:    She has no cervical adenopathy.  Neurological: She is alert and oriented to person, place, and time.  Some balance issues to test heel-to-toe walking.    Assessment and Plan :  1. Intermittent lightheadedness Having "fuzzy head"/lightheaded sensations for several weeks with blurring of vision over the past week. States BS has been in the 120-130's range this week. She contacted Dr. Gabriel Carina (endocrinologist) 12-19-17 and Dr. Nehemiah Massed (cardiologist) with plans for appointment schedule with cardiologist on 01-03-18 and with endocrinologist 02-08-18. Was a bit worried and stopped by this office to have BP checked. Diastolic pressure low with normal systolic pressure. Has a history of transcatheter aortic valve replacement on 09-04-17. During that hospitalization, hgb dropped to 8.8  on 09-04-17 and was only 9.9 by discharge on 09-06-17. Will check CBC and CMP for signs of anemia, electrolyte imbalances or dehydration. Recommend she hold the Amlodipine for a day and recheck pending lab reports. Encouraged to drink extra fluids. - CBC with Differential/Platelet - Comprehensive metabolic panel

## 2017-12-26 LAB — COMPREHENSIVE METABOLIC PANEL
A/G RATIO: 1.6 (ref 1.2–2.2)
ALT: 9 IU/L (ref 0–32)
AST: 10 IU/L (ref 0–40)
Albumin: 4.2 g/dL (ref 3.5–4.8)
Alkaline Phosphatase: 110 IU/L (ref 39–117)
BUN/Creatinine Ratio: 17 (ref 12–28)
BUN: 25 mg/dL (ref 8–27)
Bilirubin Total: <0.2 mg/dL (ref 0.0–1.2)
CALCIUM: 9.2 mg/dL (ref 8.7–10.3)
CHLORIDE: 104 mmol/L (ref 96–106)
CO2: 18 mmol/L — ABNORMAL LOW (ref 20–29)
Creatinine, Ser: 1.43 mg/dL — ABNORMAL HIGH (ref 0.57–1.00)
GFR, EST AFRICAN AMERICAN: 40 mL/min/{1.73_m2} — AB (ref >59)
GFR, EST NON AFRICAN AMERICAN: 35 mL/min/{1.73_m2} — AB (ref >59)
GLUCOSE: 158 mg/dL — AB (ref 65–99)
Globulin, Total: 2.7 g/dL (ref 1.5–4.5)
POTASSIUM: 4.9 mmol/L (ref 3.5–5.2)
Sodium: 140 mmol/L (ref 134–144)
TOTAL PROTEIN: 6.9 g/dL (ref 6.0–8.5)

## 2017-12-26 LAB — CBC WITH DIFFERENTIAL/PLATELET
BASOS ABS: 0 10*3/uL (ref 0.0–0.2)
BASOS: 0 %
EOS (ABSOLUTE): 0.3 10*3/uL (ref 0.0–0.4)
Eos: 3 %
Hematocrit: 29 % — ABNORMAL LOW (ref 34.0–46.6)
Hemoglobin: 9.4 g/dL — ABNORMAL LOW (ref 11.1–15.9)
IMMATURE GRANS (ABS): 0 10*3/uL (ref 0.0–0.1)
IMMATURE GRANULOCYTES: 0 %
LYMPHS: 16 %
Lymphocytes Absolute: 1.3 10*3/uL (ref 0.7–3.1)
MCH: 27.7 pg (ref 26.6–33.0)
MCHC: 32.4 g/dL (ref 31.5–35.7)
MCV: 86 fL (ref 79–97)
MONOS ABS: 0.9 10*3/uL (ref 0.1–0.9)
Monocytes: 11 %
NEUTROS PCT: 70 %
Neutrophils Absolute: 5.7 10*3/uL (ref 1.4–7.0)
PLATELETS: 336 10*3/uL (ref 150–379)
RBC: 3.39 x10E6/uL — AB (ref 3.77–5.28)
RDW: 13.5 % (ref 12.3–15.4)
WBC: 8.2 10*3/uL (ref 3.4–10.8)

## 2017-12-27 DIAGNOSIS — N183 Chronic kidney disease, stage 3 (moderate): Secondary | ICD-10-CM | POA: Diagnosis not present

## 2017-12-27 DIAGNOSIS — D631 Anemia in chronic kidney disease: Secondary | ICD-10-CM | POA: Diagnosis not present

## 2017-12-27 DIAGNOSIS — E1122 Type 2 diabetes mellitus with diabetic chronic kidney disease: Secondary | ICD-10-CM | POA: Diagnosis not present

## 2017-12-27 DIAGNOSIS — I129 Hypertensive chronic kidney disease with stage 1 through stage 4 chronic kidney disease, or unspecified chronic kidney disease: Secondary | ICD-10-CM | POA: Diagnosis not present

## 2017-12-28 NOTE — Progress Notes (Signed)
Patient was advised of the current labs.  Explained about adding more labs and that the order was placed.  Lab is to call us if they can not do it on the specimen they have.  Patient is going to come by the office today and get the OC light.

## 2017-12-29 LAB — IRON AND TIBC
IRON SATURATION: 7 % — AB (ref 15–55)
Iron: 25 ug/dL — ABNORMAL LOW (ref 27–139)
Total Iron Binding Capacity: 353 ug/dL (ref 250–450)
UIBC: 328 ug/dL (ref 118–369)

## 2017-12-29 LAB — SPECIMEN STATUS REPORT

## 2017-12-29 LAB — RETICULOCYTES: Retic Ct Pct: 1 % (ref 0.6–2.6)

## 2017-12-31 ENCOUNTER — Telehealth (INDEPENDENT_AMBULATORY_CARE_PROVIDER_SITE_OTHER): Payer: Medicare HMO

## 2017-12-31 DIAGNOSIS — D509 Iron deficiency anemia, unspecified: Secondary | ICD-10-CM

## 2017-12-31 DIAGNOSIS — K921 Melena: Secondary | ICD-10-CM

## 2017-12-31 DIAGNOSIS — D649 Anemia, unspecified: Secondary | ICD-10-CM

## 2017-12-31 LAB — IFOBT (OCCULT BLOOD): IFOBT: POSITIVE

## 2017-12-31 NOTE — Telephone Encounter (Signed)
Pt dropped off OC Light sample.  It showed positive for blood.  Please advise.   Thanks,   -Mickel Baas

## 2017-12-31 NOTE — Telephone Encounter (Signed)
-----   Message from Margo Common, [place] sent at 12/30/2017  5:14 PM EST ----- Iron and saturation level low. Reticulocytes indicates low iron only. Definitely need Ferrous Sulfate 325 mg qd #30 and further recommendations depending on OC-Light result.

## 2018-01-01 ENCOUNTER — Other Ambulatory Visit: Payer: Self-pay | Admitting: Family Medicine

## 2018-01-01 DIAGNOSIS — K921 Melena: Secondary | ICD-10-CM

## 2018-01-01 MED ORDER — FERROUS SULFATE 325 (65 FE) MG PO TABS: 325.0000 mg | ORAL_TABLET | Freq: Every day | ORAL | 0 refills | Status: DC

## 2018-01-01 NOTE — Telephone Encounter (Signed)
-----   Message from Margo Common, [place] sent at 01/01/2018 10:25 AM EST ----- Blood in stools and low hgb. Need to schedule evaluation by gastroenterologist.

## 2018-01-01 NOTE — Telephone Encounter (Signed)
Patient advised. RX sent to [facility]. GI referral has been ordered.

## 2018-01-01 NOTE — Telephone Encounter (Signed)
LMTCB

## 2018-01-02 ENCOUNTER — Telehealth: Payer: Self-pay | Admitting: Family Medicine

## 2018-01-02 NOTE — Telephone Encounter (Signed)
Pt returning call for referral.

## 2018-01-02 NOTE — Telephone Encounter (Signed)
Pt advised referral was sent to [facility]

## 2018-01-10 DIAGNOSIS — R42 Dizziness and giddiness: Secondary | ICD-10-CM | POA: Diagnosis not present

## 2018-01-10 DIAGNOSIS — I350 Nonrheumatic aortic (valve) stenosis: Secondary | ICD-10-CM | POA: Diagnosis not present

## 2018-01-10 DIAGNOSIS — I2510 Atherosclerotic heart disease of native coronary artery without angina pectoris: Secondary | ICD-10-CM | POA: Diagnosis not present

## 2018-01-10 DIAGNOSIS — I10 Essential (primary) hypertension: Secondary | ICD-10-CM | POA: Diagnosis not present

## 2018-01-14 DIAGNOSIS — E1122 Type 2 diabetes mellitus with diabetic chronic kidney disease: Secondary | ICD-10-CM | POA: Diagnosis not present

## 2018-01-14 DIAGNOSIS — R809 Proteinuria, unspecified: Secondary | ICD-10-CM | POA: Diagnosis not present

## 2018-01-14 DIAGNOSIS — E11319 Type 2 diabetes mellitus with unspecified diabetic retinopathy without macular edema: Secondary | ICD-10-CM | POA: Diagnosis not present

## 2018-01-14 DIAGNOSIS — E1165 Type 2 diabetes mellitus with hyperglycemia: Secondary | ICD-10-CM | POA: Diagnosis not present

## 2018-01-14 DIAGNOSIS — N183 Chronic kidney disease, stage 3 (moderate): Secondary | ICD-10-CM | POA: Diagnosis not present

## 2018-01-14 DIAGNOSIS — Z794 Long term (current) use of insulin: Secondary | ICD-10-CM | POA: Diagnosis not present

## 2018-01-14 DIAGNOSIS — E1129 Type 2 diabetes mellitus with other diabetic kidney complication: Secondary | ICD-10-CM | POA: Diagnosis not present

## 2018-01-15 ENCOUNTER — Other Ambulatory Visit: Payer: Self-pay

## 2018-01-15 ENCOUNTER — Encounter: Payer: Self-pay | Admitting: Gastroenterology

## 2018-01-15 ENCOUNTER — Ambulatory Visit (INDEPENDENT_AMBULATORY_CARE_PROVIDER_SITE_OTHER): Payer: Medicare HMO | Admitting: Gastroenterology

## 2018-01-15 ENCOUNTER — Other Ambulatory Visit
Admission: RE | Admit: 2018-01-15 | Discharge: 2018-01-15 | Disposition: A | Discharge status: Home / Self Care | Payer: Medicare HMO | Source: Ambulatory Visit | Attending: Gastroenterology | Admitting: Gastroenterology

## 2018-01-15 VITALS — BP 125/78 | HR 65 | Temp 97.5°F | Ht 65.5 in | Wt 193.6 lb

## 2018-01-15 DIAGNOSIS — R195 Other fecal abnormalities: Secondary | ICD-10-CM | POA: Diagnosis not present

## 2018-01-15 DIAGNOSIS — D508 Other iron deficiency anemias: Secondary | ICD-10-CM

## 2018-01-15 LAB — VITAMIN B12: Vitamin B-12: 166 pg/mL — ABNORMAL LOW (ref 180–914)

## 2018-01-15 LAB — CBC
HEMATOCRIT: 32 % — AB (ref 35.0–47.0)
Hemoglobin: 10.4 g/dL — ABNORMAL LOW (ref 12.0–16.0)
MCH: 27.5 pg (ref 26.0–34.0)
MCHC: 32.5 g/dL (ref 32.0–36.0)
MCV: 84.7 fL (ref 80.0–100.0)
PLATELETS: 225 10*3/uL (ref 150–440)
RBC: 3.78 MIL/uL — ABNORMAL LOW (ref 3.80–5.20)
RDW: 14.6 % — AB (ref 11.5–14.5)
WBC: 7.1 10*3/uL (ref 3.6–11.0)

## 2018-01-15 LAB — IRON AND TIBC
IRON: 59 ug/dL (ref 28–170)
Saturation Ratios: 14 % (ref 10.4–31.8)
TIBC: 413 ug/dL (ref 250–450)
UIBC: 354 ug/dL

## 2018-01-15 LAB — FERRITIN: FERRITIN: 13 ng/mL (ref 11–307)

## 2018-01-15 LAB — FOLATE: Folate: 35 ng/mL (ref >5.9)

## 2018-01-15 NOTE — Progress Notes (Signed)
[name], MD [address]  [place]  [place], [place] [address]  Main: [phone]  Fax: [phone]    Gastroenterology Consultation  Referring Provider:     Margo Common, [place] Primary Care Physician:  Jerrol Banana., MD Primary Gastroenterologist:  Dr. [name] Reason for Consultation:     Iron deficiency anemia, stool guaiac positive        HPI:   Stacey Pierce is a 77 y.o. female referred by Dr. Rosanna Randy, Retia Passe., MD  for consultation & management of iron deficiency anemia. Patient is found to have chronic normocytic anemia dating back to 08/2015. Her baseline hemoglobin levels are around 10. Most recent hemoglobin is 9.4 on 12/25/2017. She is recently found to have iron deficiency based on labs from 12/25/2017, she has low iron, low percent iron saturation, TIBC is high normal and started on oral iron 320 mg 1 pill daily. She had I oh take valve replacement secondary to severe aortic stenosis in 09/2017 and has been on aspirin and Plavix. she has seen her cardiologist recently, who recommended to stop aspirin, but continue Plavix at least for 6 months. As part of evaluation of anemia, she was tested for occult blood in stool and came back positive. Therefore, referred to GI for consultation.  Patient denies any GI symptoms including rectal bleeding or hematochezia. she reports feeling dizzy and that has prompted her PCP for further workup of anemia.  NSAIDs: none  Antiplts/Anticoagulants/Anti thrombotics: Plavix 75 mg daily  GI Procedures: colonoscopy 09/02/2010 by Dr. Vira Agar, internal hemorrhoids  Past Medical History:  Diagnosis Date  . Aortic stenosis, severe    a. 09/2017: s/p TAVR  . CKD (chronic kidney disease)   . Diabetes mellitus, insulin dependent (IDDM), uncontrolled ([place])   . DR (diabetic retinopathy) ([place])   . GERD (gastroesophageal reflux disease)   . Glaucoma   . Hyperlipidemia   . Hypertension   . Hypothyroidism     . Obesity   . S/P TAVR (transcatheter aortic valve replacement) 09/04/2017   26 mm Medtronic Evolut Pro transcatheter heart valve placed via percutaneous right transfemoral approach     Past Surgical History:  Procedure Laterality Date  . CATARACT EXTRACTION     left 2010  . CATARACT EXTRACTION W/PHACO Right 10/13/2015   Procedure: CATARACT EXTRACTION PHACO AND INTRAOCULAR LENS PLACEMENT (IOC);  Surgeon: Leandrew Koyanagi, MD;  Location: [facility];  Service: Ophthalmology;  Laterality: Right;  DIABETIC - insulin and oral meds MALYUGIN SHUGARCAINE  . CHOLECYSTECTOMY     1967  . DILATION AND CURETTAGE OF UTERUS    . EYE SURGERY    . LAPAROSCOPIC TUBAL LIGATION    . RIGHT/LEFT HEART CATH AND CORONARY ANGIOGRAPHY N/A 08/02/2017   Procedure: RIGHT/LEFT HEART CATH AND CORONARY ANGIOGRAPHY;  Surgeon: Corey Skains, MD;  Location: [facility];  Service: Cardiovascular;  Laterality: N/A;  . TEE WITHOUT CARDIOVERSION N/A 09/04/2017   Procedure: TRANSESOPHAGEAL ECHOCARDIOGRAM (TEE);  Surgeon: Sherren Mocha, MD;  Location: [facility];  Service: Open Heart Surgery;  Laterality: N/A;  . TONSILLECTOMY     1974  . TRANSCATHETER AORTIC VALVE REPLACEMENT, TRANSFEMORAL N/A 09/04/2017   Procedure: TRANSCATHETER AORTIC VALVE REPLACEMENT, TRANSFEMORAL;  Surgeon: Sherren Mocha, MD;  Location: [facility];  Service: Open Heart Surgery;  Laterality: N/A;  . TUBAL LIGATION      Prior to Admission medications   Medication Sig Start Date End Date Taking? Authorizing Provider  acetaminophen (TYLENOL)  500 MG tablet Take 1,000 mg by mouth every 6 (six) hours as needed for mild pain.   Yes [provider]  amitriptyline (ELAVIL) 10 MG tablet TAKE ONE TABLET BY MOUTH AT BEDTIME 06/21/17  Yes Jerrol Banana., MD  amLODipine (NORVASC) 2.5 MG tablet Take 1 tablet (2.5 mg total) by mouth daily. 09/27/17  Yes Eileen Stanford, PA-C  atorvastatin (LIPITOR) 40 MG tablet Take 1 tablet (40 mg  total) by mouth daily. 02/13/17  Yes Jerrol Banana., MD  BD INSULIN SYRINGE U/F 31G X 5/16" 1 ML MISC 4 (four) times daily. 12/25/17  Yes [provider]  Blood Glucose Monitoring Suppl (FIFTY50 GLUCOSE METER 2.0) w/Device KIT Use as directed. 04/20/17 04/20/18 Yes [provider]  carvedilol (COREG) 25 MG tablet TAKE 1 TABLET BY MOUTH 2 TIMES DAILY WITH A MEAL. 03/21/17  Yes Jerrol Banana., MD  clopidogrel (PLAVIX) 75 MG tablet Take 1 tablet (75 mg total) by mouth daily with breakfast. 09/06/17  Yes Eileen Stanford, PA-C  DiphenhydrAMINE HCl ([facility] ALLERGY PO) Take 1 tablet by mouth daily.   Yes [provider]  dorzolamide-timolol (COSOPT) 22.3-6.8 MG/ML ophthalmic solution Place 1 drop into both eyes 2 (two) times daily. 07/10/16  Yes [provider]  ferrous sulfate 325 (65 FE) MG tablet Take 1 tablet (325 mg total) by mouth daily with breakfast. 01/01/18  Yes Chrismon, Dennis E, PA  glucose blood test strip Use three test strips daily as instructed. 04/20/17  Yes [provider]  insulin aspart (NOVOLOG) 100 UNIT/ML injection Inject 10-15 Units into the skin 2 (two) times daily before a meal. Takes 20 units before breakfast and 25 units before dinner.   Yes [provider]  insulin detemir (LEVEMIR) 100 UNIT/ML injection Inject 20 units into the skin in the morning and 60 units at bedtime   Yes [provider]  latanoprost (XALATAN) 0.005 % ophthalmic solution Place 1 drop into both eyes at bedtime.    Yes [provider]  levothyroxine (SYNTHROID, LEVOTHROID) 100 MCG tablet TAKE ONE TABLET BY MOUTH ONCE DAILY. TAKE EARLY MORNING ON AN EMPTY STOMACH 11/14/17  Yes Jerrol Banana., MD  losartan (COZAAR) 100 MG tablet TAKE 1 TABLET BY MOUTH EVERY DAY Patient taking differently: TAKE 1 TABLET BY MOUTH AT BEDTIME 08/08/17  Yes Jerrol Banana., MD  metFORMIN (GLUCOPHAGE) 1000 MG tablet Take 2,000 mg by mouth  daily after supper. 12/31/15  Yes [provider]  pantoprazole (PROTONIX) 40 MG tablet Take 1 tablet (40 mg total) by mouth daily at 12 noon. 09/06/17  Yes Eileen Stanford, PA-C    Family History  Problem Relation Age of Onset  . Alzheimer's disease Mother   . Colon polyps Mother   . Hypertension Mother   . Heart attack Father   . Coronary artery disease Father   . Diabetes Father   . Breast cancer Sister   . Colon polyps Sister   . Diabetes Sister   . Diabetes Sister   . Diabetes Sister   . Diabetes Sister      Social History   Tobacco Use  . Smoking status: Never Smoker  . Smokeless tobacco: Never Used  Substance Use Topics  . Alcohol use: No    Alcohol/week: 0.0 oz  . Drug use: No    Allergies as of 01/15/2018 - Review Complete 01/15/2018  Allergen Reaction Noted  . Azithromycin Rash 02/17/2014  . Codeine Other (  See Comments) 05/10/2015  . Erythromycin Rash 08/26/2015  . Penicillins Rash 05/10/2015  . Sulfa antibiotics Other (See Comments) 05/10/2015    Review of Systems:    All systems reviewed and negative except where noted in HPI.   Physical Exam:  BP 125/78   Pulse 65   Temp (!) 97.5 F (36.4 C) (Oral)   Ht 5' 5.5" (1.664 m)   Wt 193 lb 9.6 oz (87.8 kg)   BMI 31.73 kg/m  No LMP recorded. Patient is postmenopausal.  General:   Alert,  Well-developed, well-nourished, pleasant and cooperative in NAD Head:  Normocephalic and atraumatic. Eyes:  Sclera clear, no icterus.   Conjunctiva pink. Ears:  Normal auditory acuity. Nose:  No deformity, discharge, or lesions. Mouth:  No deformity or lesions,oropharynx pink & moist. Neck:  Supple; no masses or thyromegaly. Lungs:  Respirations even and unlabored.  Clear throughout to auscultation.   No wheezes, crackles, or rhonchi. No acute distress. Heart:  Regular rate and rhythm; no murmurs, clicks, rubs, or gallops. Abdomen:  Normal bowel sounds. Soft, non-tender and non-distended without masses,  hepatosplenomegaly or hernias noted.  No guarding or rebound tenderness.   Rectal: Not performed Msk:  Symmetrical without gross deformities. Good, equal movement & strength bilaterally. Pulses:  Normal pulses noted. Extremities:  No clubbing or edema.  No cyanosis. Neurologic:  Alert and oriented x3;  grossly normal neurologically. Skin:  Intact without significant lesions or rashes. No jaundice. Lymph Nodes:  No significant cervical adenopathy. Psych:  Alert and cooperative. Normal mood and affect.  Imaging Studies: No recent CT abdomen/pelvis  Assessment and Plan:   Stacey Pierce is a 77 y.o. female with history of metabolic syndrome, severe aortic valve stenosis status post TAVR in 09/2017 on Plavix and 5 mg daily with chronic normocytic anemia found to be iron deficient. FOBT+  Iron deficiency anemia: - Check ferritin, iron, TIBC, CBC - Check vitamin B12 - Continue oral iron 325 mg daily for now - Discussed with her about colonoscopy in next 1-2 months if she is not responding to iron replacement therapy  Follow up in 4 weeks   [name], MD

## 2018-01-16 DIAGNOSIS — R69 Illness, unspecified: Secondary | ICD-10-CM | POA: Diagnosis not present

## 2018-01-21 DIAGNOSIS — L820 Inflamed seborrheic keratosis: Secondary | ICD-10-CM | POA: Diagnosis not present

## 2018-01-21 DIAGNOSIS — L821 Other seborrheic keratosis: Secondary | ICD-10-CM | POA: Diagnosis not present

## 2018-01-21 DIAGNOSIS — L578 Other skin changes due to chronic exposure to nonionizing radiation: Secondary | ICD-10-CM | POA: Diagnosis not present

## 2018-01-21 DIAGNOSIS — L570 Actinic keratosis: Secondary | ICD-10-CM | POA: Diagnosis not present

## 2018-02-07 ENCOUNTER — Encounter: Payer: Self-pay | Admitting: Family Medicine

## 2018-02-07 ENCOUNTER — Ambulatory Visit (INDEPENDENT_AMBULATORY_CARE_PROVIDER_SITE_OTHER): Payer: Medicare HMO | Admitting: Family Medicine

## 2018-02-07 VITALS — BP 132/80 | HR 73 | Temp 98.1°F | Resp 16 | Wt 194.4 lb

## 2018-02-07 DIAGNOSIS — J069 Acute upper respiratory infection, unspecified: Secondary | ICD-10-CM

## 2018-02-07 MED ORDER — HYDROCODONE-HOMATROPINE 5-1.5 MG/5ML PO SYRP: ORAL_SOLUTION | ORAL | 0 refills | Status: DC

## 2018-02-07 NOTE — Patient Instructions (Signed)
May continue Coricidin. Add an expectorant like Mucinex. Call if not improving early next week.

## 2018-02-07 NOTE — Progress Notes (Signed)
Subjective:     Patient ID: Stacey Pierce, female   DOB: [birth year], 77 y.o.   MRN: [id number] Chief Complaint  Patient presents with  . Generalized Body Aches    Patient comes in office with complaints of body ache that began 2 days ago. Patient reports cold like symptoms such as cough, runny nose and chills. Patient reports that she has pain around her ribs do to cough, she has tried taking toc Coricidan HbP.    HPI No fever reported. + flu shot.  Review of Systems     Objective:   Physical Exam  Constitutional: She appears well-developed and well-nourished. No distress.  Ears: T.M's intact without inflammation Throat: tonsils absent Neck: no cervical adenopathy Lungs: clear     Assessment:    1. Viral upper respiratory tract infection - HYDROcodone-homatropine (HYCODAN) 5-1.5 MG/5ML syrup; 5 ml 6-8 hours as needed for cough  Dispense: 120 mL; Refill: 0    Plan:    Continue Coricidin. Add an expectorant. Call if not improving by early next week.

## 2018-02-11 ENCOUNTER — Ambulatory Visit (INDEPENDENT_AMBULATORY_CARE_PROVIDER_SITE_OTHER): Payer: Medicare HMO | Admitting: Family Medicine

## 2018-02-11 ENCOUNTER — Telehealth: Payer: Self-pay | Admitting: Family Medicine

## 2018-02-11 ENCOUNTER — Other Ambulatory Visit: Payer: Self-pay | Admitting: Family Medicine

## 2018-02-11 ENCOUNTER — Encounter: Payer: Self-pay | Admitting: Family Medicine

## 2018-02-11 VITALS — BP 122/82 | HR 68 | Temp 98.5°F | Resp 16

## 2018-02-11 DIAGNOSIS — S20211A Contusion of right front wall of thorax, initial encounter: Secondary | ICD-10-CM | POA: Diagnosis not present

## 2018-02-11 DIAGNOSIS — J069 Acute upper respiratory infection, unspecified: Secondary | ICD-10-CM

## 2018-02-11 MED ORDER — OSELTAMIVIR PHOSPHATE 75 MG PO CAPS: 75.0000 mg | ORAL_CAPSULE | Freq: Every day | ORAL | 0 refills | Status: DC

## 2018-02-11 MED ORDER — PROMETHAZINE-DM 6.25-15 MG/5ML PO SYRP: 2.5000 mL | ORAL_SOLUTION | Freq: Four times a day (QID) | ORAL | 0 refills | Status: DC | PRN

## 2018-02-11 NOTE — Progress Notes (Signed)
Subjective:     Patient ID: Stacey Pierce, female   DOB: 1941-07-02, 77 y.o.   MRN: [id number] Chief Complaint  Patient presents with  . Follow-up    Patient returns for follow up visit from 02/07/18, patient was diagnosed with viral URI and prescribed Hycodan. Patient reports no improvement on symptoms and reports nausea, decreased appetite and complains of rib pain on the right side do to cough.    HPI States she has quit the hydrocodone after developing nausea and passing out on the way to the bathroom in the middle of the night 2 nights ago. States she landed on her right side. Reports rib pain is improved today. No sinus congestion or shortness of breath.  Review of Systems     Objective:   Physical Exam  Constitutional: She appears well-developed and well-nourished. No distress.  Pulmonary/Chest: Breath sounds normal. She has no wheezes. She exhibits tenderness (mild right lateral chest wall tenderness below her bra line without crepitus or  ecchymosis.).       Assessment:    1. Viral upper respiratory tract infection: try phenergan DM for coughing and nausea  2. Contusion of right chest wall, initial encounter: monitor for continued improvement    Plan:    Phone f/u 3/13. No more hydrocodone

## 2018-02-11 NOTE — Telephone Encounter (Signed)
Pt stated that he husband was Dx with the flu today and pt is requesting Tamiflu be sent to [facility] [address]. Please advise. Thanks TNP

## 2018-02-11 NOTE — Telephone Encounter (Signed)
Please review and advise. KW 

## 2018-02-11 NOTE — Telephone Encounter (Signed)
Tamiflu sent in

## 2018-02-11 NOTE — Telephone Encounter (Signed)
Patient was advised.  

## 2018-02-11 NOTE — Patient Instructions (Addendum)
Stop hydrocodone. Resume eating and your regular medication. I will check on you on Wednesday by phone.

## 2018-02-13 ENCOUNTER — Telehealth: Payer: Self-pay | Admitting: Family Medicine

## 2018-02-13 ENCOUNTER — Ambulatory Visit: Payer: Medicare HMO | Admitting: Gastroenterology

## 2018-02-13 ENCOUNTER — Other Ambulatory Visit: Payer: Self-pay | Admitting: Family Medicine

## 2018-02-13 MED ORDER — BENZONATATE 100 MG PO CAPS
ORAL_CAPSULE | ORAL | 0 refills | Status: DC
Start: 2018-02-13 — End: 2018-03-13

## 2018-02-13 NOTE — Telephone Encounter (Signed)
States she continues to have non-productive cough-day #8 of her a viral URI. No fever. Will add Tessalon Perles. Hydrocodone made her nauseous and contributed to syncopal episode.

## 2018-03-08 DIAGNOSIS — R69 Illness, unspecified: Secondary | ICD-10-CM | POA: Diagnosis not present

## 2018-03-13 ENCOUNTER — Ambulatory Visit: Payer: Medicare HMO | Admitting: Gastroenterology

## 2018-03-13 ENCOUNTER — Encounter: Payer: Self-pay | Admitting: Gastroenterology

## 2018-03-13 VITALS — BP 131/53 | HR 68 | Resp 16 | Ht 65.5 in | Wt 193.8 lb

## 2018-03-13 DIAGNOSIS — D509 Iron deficiency anemia, unspecified: Secondary | ICD-10-CM | POA: Diagnosis not present

## 2018-03-13 DIAGNOSIS — D519 Vitamin B12 deficiency anemia, unspecified: Secondary | ICD-10-CM | POA: Diagnosis not present

## 2018-03-13 MED ORDER — VITAMIN B-12 1000 MCG PO TABS: 1000.0000 ug | ORAL_TABLET | Freq: Every day | ORAL | 0 refills | Status: AC

## 2018-03-13 MED ORDER — FERROUS SULFATE 325 (65 FE) MG PO TABS: 325.0000 mg | ORAL_TABLET | Freq: Two times a day (BID) | ORAL | 0 refills | Status: DC

## 2018-03-13 NOTE — Progress Notes (Signed)
Stacey Darby, MD [address]  [place]  [place], [place] [address]  Main: [phone]  Fax: [phone]    Gastroenterology Consultation  Referring Provider:     Jerrol Banana.,* Primary Care Physician:  Jerrol Banana., MD Primary Gastroenterologist:  Dr. Cephas Pierce Reason for Consultation:     Iron deficiency anemia, stool guaiac positive        HPI:   Stacey Pierce is a 77 y.o. female referred by Dr. Rosanna Randy, Retia Passe., MD  for consultation & management of iron deficiency anemia. Patient is found to have chronic normocytic anemia dating back to 08/2015. Her baseline hemoglobin levels are around 10. Most recent hemoglobin is 9.4 on 12/25/2017. She is recently found to have iron deficiency based on labs from 12/25/2017, she has low iron, low percent iron saturation, TIBC is high normal and started on oral iron 320 mg 1 pill daily. She had I oh take valve replacement secondary to severe aortic stenosis in 09/2017 and has been on aspirin and Plavix. she has seen her cardiologist recently, who recommended to stop aspirin, but continue Plavix at least for 6 months. As part of evaluation of anemia, she was tested for occult blood in stool and came back positive. Therefore, referred to GI for consultation.  Patient denies any GI symptoms including rectal bleeding or hematochezia. she reports feeling dizzy and that has prompted her PCP for further workup of anemia.  Follow-up visit 03/13/2018 Patient had labs performed since last visit, her hemoglobin is improving, ferritin low-normal. B12 is low and folate is normal. Patient did not check results on my chart although she is active. She reports feeling better overall. She ran out of iron pills about 3 weeks ago. She denies abdominal pain, nausea, vomiting, melena, hematochezia, fatigue.  NSAIDs: none  Antiplts/Anticoagulants/Anti thrombotics: Plavix 75 mg daily  GI Procedures: colonoscopy 09/02/2010 by Dr.  Vira Agar, internal hemorrhoids She had another colonoscopy 5 years prior to 2011, 1 polyp was removed  Past Medical History:  Diagnosis Date  . Aortic stenosis, severe    a. 09/2017: s/p TAVR  . CKD (chronic kidney disease)   . Diabetes mellitus, insulin dependent (IDDM), uncontrolled ([facility])   . DR (diabetic retinopathy) ([facility])   . GERD (gastroesophageal reflux disease)   . Glaucoma   . Hyperlipidemia   . Hypertension   . Hypothyroidism   . Obesity   . S/P TAVR (transcatheter aortic valve replacement) 09/04/2017   26 mm Medtronic Evolut Pro transcatheter heart valve placed via percutaneous right transfemoral approach     Past Surgical History:  Procedure Laterality Date  . CATARACT EXTRACTION     left 2010  . CATARACT EXTRACTION W/PHACO Right 10/13/2015   Procedure: CATARACT EXTRACTION PHACO AND INTRAOCULAR LENS PLACEMENT (IOC);  Surgeon: Leandrew Koyanagi, MD;  Location: [facility];  Service: Ophthalmology;  Laterality: Right;  DIABETIC - insulin and oral meds MALYUGIN SHUGARCAINE  . CHOLECYSTECTOMY     1967  . DILATION AND CURETTAGE OF UTERUS    . EYE SURGERY    . LAPAROSCOPIC TUBAL LIGATION    . RIGHT/LEFT HEART CATH AND CORONARY ANGIOGRAPHY N/A 08/02/2017   Procedure: RIGHT/LEFT HEART CATH AND CORONARY ANGIOGRAPHY;  Surgeon: Corey Skains, MD;  Location: [facility];  Service: Cardiovascular;  Laterality: N/A;  . TEE WITHOUT CARDIOVERSION N/A 09/04/2017   Procedure: TRANSESOPHAGEAL ECHOCARDIOGRAM (TEE);  Surgeon: Sherren Mocha, MD;  Location: [facility];  Service: Open Heart Surgery;  Laterality: N/A;  . TONSILLECTOMY     1974  . TRANSCATHETER AORTIC VALVE REPLACEMENT, TRANSFEMORAL N/A 09/04/2017   Procedure: TRANSCATHETER AORTIC VALVE REPLACEMENT, TRANSFEMORAL;  Surgeon: Sherren Mocha, MD;  Location: [facility];  Service: Open Heart Surgery;  Laterality: N/A;  . TUBAL LIGATION       Current Outpatient Medications:  .  acetaminophen (TYLENOL) 500 MG tablet,  Take 1,000 mg by mouth every 6 (six) hours as needed for mild pain., Disp: , Rfl:  .  amitriptyline (ELAVIL) 10 MG tablet, TAKE ONE TABLET BY MOUTH AT BEDTIME, Disp: 90 tablet, Rfl: 3 .  amLODipine (NORVASC) 2.5 MG tablet, Take 1 tablet (2.5 mg total) by mouth daily., Disp: , Rfl:  .  atorvastatin (LIPITOR) 40 MG tablet, Take 1 tablet (40 mg total) by mouth daily., Disp: 90 tablet, Rfl: 3 .  BD INSULIN SYRINGE U/F 31G X 5/16" 1 ML MISC, 4 (four) times daily., Disp: , Rfl: 99 .  Blood Glucose Monitoring Suppl (FIFTY50 GLUCOSE METER 2.0) w/Device KIT, Use as directed., Disp: , Rfl:  .  carvedilol (COREG) 25 MG tablet, TAKE 1 TABLET BY MOUTH 2 TIMES DAILY WITH A MEAL., Disp: 180 tablet, Rfl: 3 .  clopidogrel (PLAVIX) 75 MG tablet, Take 1 tablet (75 mg total) by mouth daily with breakfast., Disp: 30 tablet, Rfl: 6 .  DiphenhydrAMINE HCl ([employer] ALLERGY PO), Take 1 tablet by mouth daily., Disp: , Rfl:  .  dorzolamide-timolol (COSOPT) 22.3-6.8 MG/ML ophthalmic solution, Place 1 drop into both eyes 2 (two) times daily., Disp: , Rfl: 5 .  glucose blood test strip, Use three test strips daily as instructed., Disp: , Rfl:  .  insulin aspart (NOVOLOG) 100 UNIT/ML injection, Inject 10-15 Units into the skin 2 (two) times daily before a meal. Takes 20 units before breakfast and 25 units before dinner., Disp: , Rfl:  .  insulin detemir (LEVEMIR) 100 UNIT/ML injection, Inject 20 units into the skin in the morning and 60 units at bedtime, Disp: , Rfl:  .  latanoprost (XALATAN) 0.005 % ophthalmic solution, Place 1 drop into both eyes at bedtime. , Disp: , Rfl:  .  levothyroxine (SYNTHROID, LEVOTHROID) 100 MCG tablet, TAKE ONE TABLET BY MOUTH ONCE DAILY. TAKE EARLY MORNING ON AN EMPTY STOMACH, Disp: 90 tablet, Rfl: 3 .  losartan (COZAAR) 100 MG tablet, TAKE 1 TABLET BY MOUTH EVERY DAY, Disp: 90 tablet, Rfl: 3 .  metFORMIN (GLUCOPHAGE) 1000 MG tablet, Take 2,000 mg by mouth daily after supper., Disp: , Rfl:  .   pantoprazole (PROTONIX) 40 MG tablet, Take 1 tablet (40 mg total) by mouth daily at 12 noon., Disp: 90 tablet, Rfl: 3 .  ferrous sulfate 325 (65 FE) MG tablet, Take 1 tablet (325 mg total) by mouth 2 (two) times daily with a meal., Disp: 180 tablet, Rfl: 0 .  promethazine-dextromethorphan (PROMETHAZINE-DM) 6.25-15 MG/5ML syrup, Take 2.5 mLs by mouth 4 (four) times daily as needed for cough. (Patient not taking: Reported on 03/13/2018), Disp: 118 mL, Rfl: 0 .  vitamin B-12 (CYANOCOBALAMIN) 1000 MCG tablet, Take 1 tablet (1,000 mcg total) by mouth daily., Disp: 60 tablet, Rfl: 0   Family History  Problem Relation Age of Onset  . Alzheimer's disease Mother   . Colon polyps Mother   . Hypertension Mother   . Heart attack Father   . Coronary artery disease Father   . Diabetes Father   . Breast cancer Sister   . Colon polyps Sister   . Diabetes Sister   .  Diabetes Sister   . Diabetes Sister   . Diabetes Sister      Social History   Tobacco Use  . Smoking status: Never Smoker  . Smokeless tobacco: Never Used  Substance Use Topics  . Alcohol use: No    Alcohol/week: 0.0 oz  . Drug use: No    Allergies as of 03/13/2018 - Review Complete 03/13/2018  Allergen Reaction Noted  . Hydrocodone-homatropine  02/11/2018  . Azithromycin Rash 02/17/2014  . Codeine Other (See Comments) 05/10/2015  . Erythromycin Rash 08/26/2015  . Penicillins Rash 05/10/2015  . Sulfa antibiotics Other (See Comments) 05/10/2015    Review of Systems:    All systems reviewed and negative except where noted in HPI.   Physical Exam:  BP (!) 131/53   Pulse 68   Resp 16   Ht 5' 5.5" (1.664 m)   Wt 193 lb 12.8 oz (87.9 kg)   BMI 31.76 kg/m  No LMP recorded. Patient is postmenopausal.  General:   Alert,  Well-developed, well-nourished, pleasant and cooperative in NAD Head:  Normocephalic and atraumatic. Eyes:  Sclera clear, no icterus.   Conjunctiva pink. Ears:  Normal auditory acuity. Nose:  No  deformity, discharge, or lesions. Mouth:  No deformity or lesions,oropharynx pink & moist. Neck:  Supple; no masses or thyromegaly. Lungs:  Respirations even and unlabored.  Clear throughout to auscultation.   No wheezes, crackles, or rhonchi. No acute distress. Heart:  Regular rate and rhythm; no murmurs, clicks, rubs, or gallops. Abdomen:  Normal bowel sounds. Soft, non-tender and non-distended without masses, hepatosplenomegaly or hernias noted.  No guarding or rebound tenderness.   Rectal: Not performed Msk:  Symmetrical without gross deformities. Good, equal movement & strength bilaterally. Pulses:  Normal pulses noted. Extremities:  No clubbing or edema.  No cyanosis. Neurologic:  Alert and oriented x3;  grossly normal neurologically. Skin:  Intact without significant lesions or rashes. No jaundice. Lymph Nodes:  No significant cervical adenopathy. Psych:  Alert and cooperative. Normal mood and affect.  Imaging Studies: No recent CT abdomen/pelvis  Assessment and Plan:   SAREA FYFE is a 77 y.o. female with history of metabolic syndrome, severe aortic valve stenosis status post TAVR in 09/2017 on Plavix 29m daily with chronic normocytic anemia found to be iron deficient. FOBT+  Iron deficiency and B12 deficiency anemia: - recommend to continue oral iron, increase to 2 pills daily - Start vitamin B12 10042m daily - recheck labs in one month - Check antiparietal cell antibody, anti-intrinsic factor antibody - Discussed with her about the indication for colonoscopy is iron deficiency anemia, patient would like to wait for 1-2 months as she had 2 colonoscopies in the past, she is not concerned about missing colon cancer.I think, this is reasonable approach.I will pursue endoscopic evaluation if she is not responding to iron and B12 replacement therapy  Follow up in 5 weeks   RoCephas DarbyMD

## 2018-03-14 DIAGNOSIS — E11319 Type 2 diabetes mellitus with unspecified diabetic retinopathy without macular edema: Secondary | ICD-10-CM | POA: Diagnosis not present

## 2018-03-14 DIAGNOSIS — R42 Dizziness and giddiness: Secondary | ICD-10-CM | POA: Diagnosis not present

## 2018-03-14 DIAGNOSIS — Z794 Long term (current) use of insulin: Secondary | ICD-10-CM | POA: Diagnosis not present

## 2018-03-14 DIAGNOSIS — I10 Essential (primary) hypertension: Secondary | ICD-10-CM | POA: Diagnosis not present

## 2018-03-14 DIAGNOSIS — I2510 Atherosclerotic heart disease of native coronary artery without angina pectoris: Secondary | ICD-10-CM | POA: Diagnosis not present

## 2018-03-14 DIAGNOSIS — E782 Mixed hyperlipidemia: Secondary | ICD-10-CM | POA: Diagnosis not present

## 2018-03-14 DIAGNOSIS — I350 Nonrheumatic aortic (valve) stenosis: Secondary | ICD-10-CM | POA: Diagnosis not present

## 2018-03-21 DIAGNOSIS — H401132 Primary open-angle glaucoma, bilateral, moderate stage: Secondary | ICD-10-CM | POA: Diagnosis not present

## 2018-03-27 ENCOUNTER — Other Ambulatory Visit: Payer: Self-pay | Admitting: Family Medicine

## 2018-03-27 DIAGNOSIS — I10 Essential (primary) hypertension: Secondary | ICD-10-CM

## 2018-03-28 DIAGNOSIS — E113293 Type 2 diabetes mellitus with mild nonproliferative diabetic retinopathy without macular edema, bilateral: Secondary | ICD-10-CM | POA: Diagnosis not present

## 2018-03-28 LAB — HM DIABETES EYE EXAM

## 2018-04-03 DIAGNOSIS — R69 Illness, unspecified: Secondary | ICD-10-CM | POA: Diagnosis not present

## 2018-04-08 ENCOUNTER — Other Ambulatory Visit
Admission: RE | Admit: 2018-04-08 | Discharge: 2018-04-08 | Disposition: A | Discharge status: Home / Self Care | Payer: Medicare HMO | Source: Ambulatory Visit | Attending: Gastroenterology | Admitting: Gastroenterology

## 2018-04-08 DIAGNOSIS — D519 Vitamin B12 deficiency anemia, unspecified: Secondary | ICD-10-CM | POA: Diagnosis not present

## 2018-04-08 DIAGNOSIS — D509 Iron deficiency anemia, unspecified: Secondary | ICD-10-CM | POA: Insufficient documentation

## 2018-04-08 LAB — CBC
HEMATOCRIT: 32 % — AB (ref 35.0–47.0)
HEMOGLOBIN: 10.6 g/dL — AB (ref 12.0–16.0)
MCH: 28.4 pg (ref 26.0–34.0)
MCHC: 33 g/dL (ref 32.0–36.0)
MCV: 86.1 fL (ref 80.0–100.0)
PLATELETS: 258 10*3/uL (ref 150–440)
RBC: 3.72 MIL/uL — AB (ref 3.80–5.20)
RDW: 14.9 % — ABNORMAL HIGH (ref 11.5–14.5)
WBC: 6.5 10*3/uL (ref 3.6–11.0)

## 2018-04-08 LAB — IRON AND TIBC
Iron: 71 ug/dL (ref 28–170)
Saturation Ratios: 17 % (ref 10.4–31.8)
TIBC: 431 ug/dL (ref 250–450)
UIBC: 360 ug/dL

## 2018-04-08 LAB — VITAMIN B12: Vitamin B-12: 852 pg/mL (ref 180–914)

## 2018-04-08 LAB — FERRITIN: Ferritin: 15 ng/mL (ref 11–307)

## 2018-04-09 LAB — INTRINSIC FACTOR ANTIBODIES: Intrinsic Factor: 0.9 AU/mL (ref 0.0–1.1)

## 2018-04-09 LAB — ANTI-PARIETAL ANTIBODY: Parietal Cell Antibody-IgG: 1.5 Units (ref 0.0–20.0)

## 2018-04-10 ENCOUNTER — Encounter: Payer: Self-pay | Admitting: Gastroenterology

## 2018-04-10 ENCOUNTER — Ambulatory Visit: Payer: Medicare HMO | Admitting: Gastroenterology

## 2018-04-10 ENCOUNTER — Other Ambulatory Visit: Payer: Self-pay

## 2018-04-10 VITALS — BP 134/69 | HR 67 | Ht 65.5 in | Wt 193.0 lb

## 2018-04-10 DIAGNOSIS — D500 Iron deficiency anemia secondary to blood loss (chronic): Secondary | ICD-10-CM

## 2018-04-10 HISTORY — DX: Iron deficiency anemia secondary to blood loss (chronic): D50.0

## 2018-04-10 NOTE — Progress Notes (Signed)
[name], MD [address]  [place]  [place], [place] [address]  Main: [phone]  Fax: [phone]    Gastroenterology Consultation  Referring Provider:     Jerrol Banana.,* Primary Care Physician:  Jerrol Banana., MD Primary Gastroenterologist:  Dr. [name] Reason for Consultation:     Iron deficiency anemia, stool guaiac positive        HPI:   Stacey Pierce is a 77 y.o. female referred by Dr. Rosanna Randy, Retia Passe., MD  for consultation & management of iron deficiency anemia. Patient is found to have chronic normocytic anemia dating back to 08/2015. Her baseline hemoglobin levels are around 10. Most recent hemoglobin is 9.4 on 12/25/2017. She is recently found to have iron deficiency based on labs from 12/25/2017, she has low iron, low percent iron saturation, TIBC is high normal and started on oral iron 320 mg 1 pill daily. She had I oh take valve replacement secondary to severe aortic stenosis in 09/2017 and has been on aspirin and Plavix. she has seen her cardiologist recently, who recommended to stop aspirin, but continue Plavix at least for 6 months. As part of evaluation of anemia, she was tested for occult blood in stool and came back positive. Therefore, referred to GI for consultation.  Patient denies any GI symptoms including rectal bleeding or hematochezia. she reports feeling dizzy and that has prompted her PCP for further workup of anemia.  Follow-up visit 03/13/2018 Patient had labs performed since last visit, her hemoglobin is improving, ferritin low-normal. B12 is low and folate is normal. Patient did not check results on my chart although she is active. She reports feeling better overall. She ran out of iron pills about 3 weeks ago. She denies abdominal pain, nausea, vomiting, melena, hematochezia, fatigue.  Follow-up visit 04/10/2018 She denies any complaints today. She is taking vitamin B12 1072mg daily, oral iron 2 pills  daily. Reports her stools are black and formed sometimes hard since she started taking iron pills. She did not check results on my chart  NSAIDs: none  Antiplts/Anticoagulants/Anti thrombotics: Plavix 75 mg daily  GI Procedures: colonoscopy 09/02/2010 by Dr. EVira Agar internal hemorrhoids She had another colonoscopy 5 years prior to 2011, 1 polyp was removed  Past Medical History:  Diagnosis Date  . Aortic stenosis, severe    a. 09/2017: s/p TAVR  . CKD (chronic kidney disease)   . Diabetes mellitus, insulin dependent (IDDM), uncontrolled ([facility]   . DR (diabetic retinopathy) ([facility]   . GERD (gastroesophageal reflux disease)   . Glaucoma   . Hyperlipidemia   . Hypertension   . Hypothyroidism   . Obesity   . S/P TAVR (transcatheter aortic valve replacement) 09/04/2017   26 mm Medtronic Evolut Pro transcatheter heart valve placed via percutaneous right transfemoral approach     Past Surgical History:  Procedure Laterality Date  . CATARACT EXTRACTION     left 2010  . CATARACT EXTRACTION W/PHACO Right 10/13/2015   Procedure: CATARACT EXTRACTION PHACO AND INTRAOCULAR LENS PLACEMENT (IOC);  Surgeon: CLeandrew Koyanagi MD;  [facility]  Service: Ophthalmology;  Laterality: Right;  DIABETIC - insulin and oral meds MALYUGIN SHUGARCAINE  . CHOLECYSTECTOMY     1967  . DILATION AND CURETTAGE OF UTERUS    . EYE SURGERY    . LAPAROSCOPIC TUBAL LIGATION    . RIGHT/LEFT HEART CATH AND CORONARY ANGIOGRAPHY N/A 08/02/2017   Procedure: RIGHT/LEFT HEART CATH AND CORONARY ANGIOGRAPHY;  Surgeon: Corey Skains, MD;  Location: [facility];  Service: Cardiovascular;  Laterality: N/A;  . TEE WITHOUT CARDIOVERSION N/A 09/04/2017   Procedure: TRANSESOPHAGEAL ECHOCARDIOGRAM (TEE);  Surgeon: Sherren Mocha, MD;  Location: [facility];  Service: Open Heart Surgery;  Laterality: N/A;  . TONSILLECTOMY     1974  . TRANSCATHETER AORTIC VALVE REPLACEMENT, TRANSFEMORAL N/A 09/04/2017    Procedure: TRANSCATHETER AORTIC VALVE REPLACEMENT, TRANSFEMORAL;  Surgeon: Sherren Mocha, MD;  Location: [facility];  Service: Open Heart Surgery;  Laterality: N/A;  . TUBAL LIGATION       Current Outpatient Medications:  .  acetaminophen (TYLENOL) 500 MG tablet, Take 1,000 mg by mouth every 6 (six) hours as needed for mild pain., Disp: , Rfl:  .  amitriptyline (ELAVIL) 10 MG tablet, TAKE ONE TABLET BY MOUTH AT BEDTIME, Disp: 90 tablet, Rfl: 3 .  amLODipine (NORVASC) 2.5 MG tablet, Take 1 tablet (2.5 mg total) by mouth daily., Disp: , Rfl:  .  atorvastatin (LIPITOR) 40 MG tablet, Take 1 tablet (40 mg total) by mouth daily., Disp: 90 tablet, Rfl: 3 .  BD INSULIN SYRINGE U/F 31G X 5/16" 1 ML MISC, 4 (four) times daily., Disp: , Rfl: 99 .  Blood Glucose Monitoring Suppl (FIFTY50 GLUCOSE METER 2.0) w/Device KIT, Use as directed., Disp: , Rfl:  .  carvedilol (COREG) 25 MG tablet, TAKE 1 TABLET BY MOUTH 2 TIMES DAILY WITH A MEAL., Disp: 180 tablet, Rfl: 3 .  clopidogrel (PLAVIX) 75 MG tablet, Take 1 tablet (75 mg total) by mouth daily with breakfast., Disp: 30 tablet, Rfl: 6 .  DiphenhydrAMINE HCl ([employer] ALLERGY PO), Take 1 tablet by mouth daily., Disp: , Rfl:  .  dorzolamide-timolol (COSOPT) 22.3-6.8 MG/ML ophthalmic solution, Place 1 drop into both eyes 2 (two) times daily., Disp: , Rfl: 5 .  ferrous sulfate 325 (65 FE) MG tablet, Take 1 tablet (325 mg total) by mouth 2 (two) times daily with a meal., Disp: 180 tablet, Rfl: 0 .  glucose blood test strip, Use three test strips daily as instructed., Disp: , Rfl:  .  insulin aspart (NOVOLOG) 100 UNIT/ML injection, Inject 10-15 Units into the skin 2 (two) times daily before a meal. Takes 20 units before breakfast and 25 units before dinner., Disp: , Rfl:  .  insulin detemir (LEVEMIR) 100 UNIT/ML injection, Inject 20 units into the skin in the morning and 60 units at bedtime, Disp: , Rfl:  .  latanoprost (XALATAN) 0.005 % ophthalmic solution, Place 1 drop into  both eyes at bedtime. , Disp: , Rfl:  .  levothyroxine (SYNTHROID, LEVOTHROID) 100 MCG tablet, TAKE ONE TABLET BY MOUTH ONCE DAILY. TAKE EARLY MORNING ON AN EMPTY STOMACH, Disp: 90 tablet, Rfl: 3 .  losartan (COZAAR) 100 MG tablet, TAKE 1 TABLET BY MOUTH EVERY DAY, Disp: 90 tablet, Rfl: 3 .  metFORMIN (GLUCOPHAGE) 1000 MG tablet, Take 2,000 mg by mouth daily after supper., Disp: , Rfl:  .  pantoprazole (PROTONIX) 40 MG tablet, Take 1 tablet (40 mg total) by mouth daily at 12 noon., Disp: 90 tablet, Rfl: 3 .  vitamin B-12 (CYANOCOBALAMIN) 1000 MCG tablet, Take 1 tablet (1,000 mcg total) by mouth daily., Disp: 60 tablet, Rfl: 0 .  promethazine-dextromethorphan (PROMETHAZINE-DM) 6.25-15 MG/5ML syrup, Take 2.5 mLs by mouth 4 (four) times daily as needed for cough. (Patient not taking: Reported on 03/13/2018), Disp: 118 mL, Rfl: 0   Family History  Problem Relation Age of Onset  . Alzheimer's disease Mother   .  Colon polyps Mother   . Hypertension Mother   . Heart attack Father   . Coronary artery disease Father   . Diabetes Father   . Breast cancer Sister   . Colon polyps Sister   . Diabetes Sister   . Diabetes Sister   . Diabetes Sister   . Diabetes Sister      Social History   Tobacco Use  . Smoking status: Never Smoker  . Smokeless tobacco: Never Used  Substance Use Topics  . Alcohol use: No    Alcohol/week: 0.0 oz  . Drug use: No    Allergies as of 04/10/2018 - Review Complete 04/10/2018  Allergen Reaction Noted  . Hydrocodone-homatropine  02/11/2018  . Azithromycin Rash 02/17/2014  . Codeine Other (See Comments) 05/10/2015  . Erythromycin Rash 08/26/2015  . Penicillins Rash 05/10/2015  . Sulfa antibiotics Other (See Comments) 05/10/2015    Review of Systems:    All systems reviewed and negative except where noted in HPI.   Physical Exam:  BP 134/69   Pulse 67   Ht 5' 5.5" (1.664 m)   Wt 193 lb (87.5 kg)   BMI 31.63 kg/m  No LMP recorded. Patient is  postmenopausal.  General:   Alert,  Well-developed, well-nourished, pleasant and cooperative in NAD Head:  Normocephalic and atraumatic. Eyes:  Sclera clear, no icterus.   Conjunctiva pink. Ears:  Normal auditory acuity. Nose:  No deformity, discharge, or lesions. Mouth:  No deformity or lesions,oropharynx pink & moist. Neck:  Supple; no masses or thyromegaly. Lungs:  Respirations even and unlabored.  Clear throughout to auscultation.   No wheezes, crackles, or rhonchi. No acute distress. Heart:  Regular rate and rhythm; no murmurs, clicks, rubs, or gallops. Abdomen:  Normal bowel sounds. Soft, non-tender and non-distended without masses, hepatosplenomegaly or hernias noted.  No guarding or rebound tenderness.   Rectal: Not performed Msk:  Symmetrical without gross deformities. Good, equal movement & strength bilaterally. Pulses:  Normal pulses noted. Extremities:  No clubbing or edema.  No cyanosis. Neurologic:  Alert and oriented x3;  grossly normal neurologically. Skin:  Intact without significant lesions or rashes. No jaundice. Lymph Nodes:  No significant cervical adenopathy. Psych:  Alert and cooperative. Normal mood and affect.  Imaging Studies: No recent CT abdomen/pelvis  Assessment and Plan:   Stacey Pierce is a 77 y.o. female with history of metabolic syndrome, severe aortic valve stenosis status post TAVR in 09/2017 on Plavix 80m daily with chronic normocytic anemia found to be iron deficient. FOBT+  Iron deficiency and B12 deficiency anemia: - recommend to continue oral iron 2 pills daily - will refer to oncology for parenteral iron therapy - vitamin B12 levels came back normal, she can stop B12 supplementation - Antiparietal cell antibodies, intrinsic factor antibodies came back normal - recommend colonoscopy after she completes course of antiplatelet therapy/Plavix which she will be finishing in next 2-3 weeks. We'll schedule the procedure sometime in June,  2019   Follow up in 2 months   RCephas Darby MD

## 2018-04-12 ENCOUNTER — Other Ambulatory Visit: Payer: Self-pay | Admitting: Physician Assistant

## 2018-04-12 NOTE — Telephone Encounter (Signed)
ASSESSMENT & PLAN:   Severe AS s/p TAVR: she has NHYA class I symptoms. 2D ECHO showed normal functioning valve with no PVL, mean gradient 11 mm Hg. Continue ASA/plavix. Plavix can be discontinued after 6 months of therapy (mid march.) She has been given a Rx for Clindamycin for SBE prophylaxis given her penicillin allergy

## 2018-04-16 ENCOUNTER — Inpatient Hospital Stay: Payer: Medicare HMO | Attending: Oncology | Admitting: Oncology

## 2018-04-16 ENCOUNTER — Encounter: Payer: Self-pay | Admitting: Oncology

## 2018-04-16 VITALS — BP 126/54 | HR 67 | Temp 97.9°F | Resp 18 | Ht 65.5 in | Wt 192.6 lb

## 2018-04-16 DIAGNOSIS — I10 Essential (primary) hypertension: Secondary | ICD-10-CM

## 2018-04-16 DIAGNOSIS — D509 Iron deficiency anemia, unspecified: Secondary | ICD-10-CM | POA: Diagnosis not present

## 2018-04-16 DIAGNOSIS — E119 Type 2 diabetes mellitus without complications: Secondary | ICD-10-CM | POA: Diagnosis not present

## 2018-04-16 DIAGNOSIS — E785 Hyperlipidemia, unspecified: Secondary | ICD-10-CM | POA: Insufficient documentation

## 2018-04-16 DIAGNOSIS — D500 Iron deficiency anemia secondary to blood loss (chronic): Secondary | ICD-10-CM

## 2018-04-16 NOTE — Progress Notes (Signed)
Hematology/Oncology Consult note [facility] Telephone:([phone] [phone]  Patient Care Team: Jerrol Banana., MD as PCP - General (Family Medicine) Corey Skains, MD as Consulting Physician (Internal Medicine) Rexene Alberts, MD as Consulting Physician (Cardiothoracic Surgery) Leandrew Koyanagi, MD as Referring Physician (Ophthalmology) Gabriel Carina Betsey Holiday, MD as Physician Assistant (Endocrinology) Benjaman Kindler, MD as Consulting Physician (Obstetrics and Gynecology) Lavonia Dana, MD as Consulting Physician (Internal Medicine)   Name of the patient: Stacey Pierce  [id number]  Sep 20, 1941    Reason for referral- iron deficiency anemia   Referring physician- Dr. Marius Ditch  Date of visit: 04/16/18   History of presenting illness-patient is a 77 year old female with a past medical history significant for obesity, hypertension, hyperlipidemia, diabetes who recently underwent TAVR procedure.  She has baseline chronic anemia and her hemoglobin is around 10.  Patient was found to have evidence of iron deficiency based on her labs in January 2019 and she was referred to Dr. Marius Ditch for further management.  She also had stool occult testing which came back positive.  She has been on oral iron since February 2019.  She was also taking thousand micrograms of oral B12 until recently.  With regards to her TAVR procedure she was on aspirin and Plavix.  Subsequently her aspirin was stopped and she will be coming off her Plavix in the next couple of weeks following which she will be getting an EGD and colonoscopy by Dr. Marius Ditch.  She has been referred to [place] for management of iron deficiency anemia.  Recent CBC on 04/08/2018 showed white count of 6.5, H&H of 10.6/32 with a platelet count of 258.  Iron studies were normal.  Ferritin level was low at 15.  B12 level was normal at 852.  Prior folate was normal in February 2019.  Patient denies any gross blood in her  urine or stool.  She denies any consistent use of NSAIDs.  Her last colonoscopy prior to this was about 10 years ago.  She did not undergo subsequent colonoscopy due to her heart murmur  ECOG PS- 1  Pain scale- 0   Review of systems- Review of Systems  Constitutional: Positive for malaise/fatigue. Negative for chills, fever and weight loss.  HENT: Negative for congestion, ear discharge and nosebleeds.   Eyes: Negative for blurred vision.  Respiratory: Negative for cough, hemoptysis, sputum production, shortness of breath and wheezing.   Cardiovascular: Negative for chest pain, palpitations, orthopnea and claudication.  Gastrointestinal: Negative for abdominal pain, blood in stool, constipation, diarrhea, heartburn, melena, nausea and vomiting.  Genitourinary: Negative for dysuria, flank pain, frequency, hematuria and urgency.  Musculoskeletal: Negative for back pain, joint pain and myalgias.  Skin: Negative for rash.  Neurological: Negative for dizziness, tingling, focal weakness, seizures, weakness and headaches.  Endo/Heme/Allergies: Does not bruise/bleed easily.  Psychiatric/Behavioral: Negative for depression and suicidal ideas. The patient does not have insomnia.     Allergies  Allergen Reactions  . Hydrocodone-Homatropine     Nausea/dizziness  . Azithromycin Rash    Other reaction(s): Unknown MYCIN GROUP   . Codeine Other (See Comments)    "COLIC"  . Erythromycin Rash  . Penicillins Rash    Has patient had a PCN reaction causing immediate rash, facial/tongue/throat swelling, SOB or lightheadedness with hypotension: No Has patient had a PCN reaction causing severe rash involving mucus membranes or skin necrosis: No Has patient had a PCN reaction that required hospitalization: No Has patient had a PCN reaction occurring within the  last 10 years: No If all of the above answers are "NO", then may proceed with Cephalosporin use.   . Sulfa Antibiotics Other (See Comments)     "COLIC"    Patient Active Problem List   Diagnosis Date Noted  . Iron deficiency anemia due to chronic blood loss 04/10/2018  . B12 deficiency anemia 03/13/2018  . Dizziness 01/10/2018  . Obesity   . S/P TAVR (transcatheter aortic valve replacement) 09/04/2017  . Aortic stenosis, severe 07/30/2017  . Allergic rhinitis 08/26/2015  . Airway hyperreactivity 08/26/2015  . Back pain, chronic 08/26/2015  . Diabetes ([place]) 08/26/2015  . Degeneration of lumbar or lumbosacral intervertebral disc 08/26/2015  . Acid reflux 08/26/2015  . Glaucoma 08/26/2015  . HLD (hyperlipidemia) 08/26/2015  . Adiposity 08/26/2015  . Benign essential HTN 04/29/2015  . Arteriosclerosis of coronary artery 01/26/2015  . Retinopathy 11/19/2014  . Chronic kidney disease (CKD), stage III (moderate) ([place]) 11/19/2014  . Long-term insulin use ([place]) 11/19/2014  . Adult hypothyroidism 02/17/2014     Past Medical History:  Diagnosis Date  . Aortic stenosis, severe    a. 09/2017: s/p TAVR  . CKD (chronic kidney disease)   . Diabetes mellitus, insulin dependent (IDDM), uncontrolled ([place])   . DR (diabetic retinopathy) ([place])   . GERD (gastroesophageal reflux disease)   . Glaucoma   . Hyperlipidemia   . Hypertension   . Hypothyroidism   . Obesity   . S/P TAVR (transcatheter aortic valve replacement) 09/04/2017   26 mm Medtronic Evolut Pro transcatheter heart valve placed via percutaneous right transfemoral approach      Past Surgical History:  Procedure Laterality Date  . CATARACT EXTRACTION     left 2010  . CATARACT EXTRACTION W/PHACO Right 10/13/2015   Procedure: CATARACT EXTRACTION PHACO AND INTRAOCULAR LENS PLACEMENT (IOC);  Surgeon: Leandrew Koyanagi, MD;  Location: [place];  Service: Ophthalmology;  Laterality: Right;  DIABETIC - insulin and oral meds MALYUGIN SHUGARCAINE  . CHOLECYSTECTOMY     1967  . DILATION AND CURETTAGE OF UTERUS    . EYE SURGERY    . LAPAROSCOPIC TUBAL LIGATION      . RIGHT/LEFT HEART CATH AND CORONARY ANGIOGRAPHY N/A 08/02/2017   Procedure: RIGHT/LEFT HEART CATH AND CORONARY ANGIOGRAPHY;  Surgeon: Corey Skains, MD;  Location: [place] [facility];  Service: Cardiovascular;  Laterality: N/A;  . TEE WITHOUT CARDIOVERSION N/A 09/04/2017   Procedure: TRANSESOPHAGEAL ECHOCARDIOGRAM (TEE);  Surgeon: Sherren Mocha, MD;  Location: [place];  Service: Open Heart Surgery;  Laterality: N/A;  . TONSILLECTOMY     1974  . TRANSCATHETER AORTIC VALVE REPLACEMENT, TRANSFEMORAL N/A 09/04/2017   Procedure: TRANSCATHETER AORTIC VALVE REPLACEMENT, TRANSFEMORAL;  Surgeon: Sherren Mocha, MD;  Location: [place];  Service: Open Heart Surgery;  Laterality: N/A;  . TUBAL LIGATION      Social History   Socioeconomic History  . Marital status: Married    Spouse name: Not on file  . Number of children: Not on file  . Years of education: Not on file  . Highest education level: Not on file  Occupational History  . Not on file  Social Needs  . Financial resource strain: Not on file  . Food insecurity:    Worry: Not on file    Inability: Not on file  . Transportation needs:    Medical: Not on file    Non-medical: Not on file  Tobacco Use  . Smoking status: Never Smoker  . Smokeless tobacco: Never Used  Substance  and Sexual Activity  . Alcohol use: No    Alcohol/week: 0.0 oz  . Drug use: No  . Sexual activity: Not on file  Lifestyle  . Physical activity:    Days per week: Not on file    Minutes per session: Not on file  . Stress: Not on file  Relationships  . Social connections:    Talks on phone: Not on file    Gets together: Not on file    Attends religious service: Not on file    Active member of club or organization: Not on file    Attends meetings of clubs or organizations: Not on file    Relationship status: Not on file  . Intimate partner violence:    Fear of current or ex partner: Not on file    Emotionally abused: Not on file    Physically abused:  Not on file    Forced sexual activity: Not on file  Other Topics Concern  . Not on file  Social History Narrative  . Not on file     Family History  Problem Relation Age of Onset  . Alzheimer's disease Mother   . Colon polyps Mother   . Hypertension Mother   . Heart attack Father   . Coronary artery disease Father   . Diabetes Father   . Breast cancer Sister   . Colon polyps Sister   . Diabetes Sister   . Diabetes Sister   . Diabetes Sister   . Diabetes Sister      Current Outpatient Medications:  .  amitriptyline (ELAVIL) 10 MG tablet, TAKE ONE TABLET BY MOUTH AT BEDTIME, Disp: 90 tablet, Rfl: 3 .  amLODipine (NORVASC) 2.5 MG tablet, Take 1 tablet (2.5 mg total) by mouth daily., Disp: , Rfl:  .  atorvastatin (LIPITOR) 40 MG tablet, Take 1 tablet (40 mg total) by mouth daily., Disp: 90 tablet, Rfl: 3 .  BD INSULIN SYRINGE U/F 31G X 5/16" 1 ML MISC, 4 (four) times daily., Disp: , Rfl: 99 .  Blood Glucose Monitoring Suppl (FIFTY50 GLUCOSE METER 2.0) w/Device KIT, Use as directed., Disp: , Rfl:  .  carvedilol (COREG) 25 MG tablet, TAKE 1 TABLET BY MOUTH 2 TIMES DAILY WITH A MEAL., Disp: 180 tablet, Rfl: 3 .  clopidogrel (PLAVIX) 75 MG tablet, Take 1 tablet (75 mg total) by mouth daily with breakfast., Disp: 30 tablet, Rfl: 6 .  DiphenhydrAMINE HCl ([employer] ALLERGY PO), Take 1 tablet by mouth daily., Disp: , Rfl:  .  dorzolamide-timolol (COSOPT) 22.3-6.8 MG/ML ophthalmic solution, Place 1 drop into both eyes 2 (two) times daily., Disp: , Rfl: 5 .  ferrous sulfate 325 (65 FE) MG tablet, Take 1 tablet (325 mg total) by mouth 2 (two) times daily with a meal., Disp: 180 tablet, Rfl: 0 .  glucose blood test strip, Use three test strips daily as instructed., Disp: , Rfl:  .  insulin aspart (NOVOLOG) 100 UNIT/ML injection, Inject 10-15 Units into the skin 2 (two) times daily before a meal. Takes 20 units before breakfast and 25 units before dinner., Disp: , Rfl:  .  insulin detemir (LEVEMIR)  100 UNIT/ML injection, Inject 20 units into the skin in the morning and 60 units at bedtime, Disp: , Rfl:  .  latanoprost (XALATAN) 0.005 % ophthalmic solution, Place 1 drop into both eyes at bedtime. , Disp: , Rfl:  .  levothyroxine (SYNTHROID, LEVOTHROID) 100 MCG tablet, TAKE ONE TABLET BY MOUTH ONCE DAILY. TAKE EARLY MORNING ON  AN EMPTY STOMACH, Disp: 90 tablet, Rfl: 3 .  losartan (COZAAR) 100 MG tablet, TAKE 1 TABLET BY MOUTH EVERY DAY, Disp: 90 tablet, Rfl: 3 .  metFORMIN (GLUCOPHAGE) 1000 MG tablet, Take 2,000 mg by mouth daily after supper., Disp: , Rfl:  .  pantoprazole (PROTONIX) 40 MG tablet, Take 1 tablet (40 mg total) by mouth daily at 12 noon., Disp: 90 tablet, Rfl: 3 .  vitamin B-12 (CYANOCOBALAMIN) 1000 MCG tablet, Take 1 tablet (1,000 mcg total) by mouth daily., Disp: 60 tablet, Rfl: 0 .  acetaminophen (TYLENOL) 500 MG tablet, Take 1,000 mg by mouth every 6 (six) hours as needed for mild pain., Disp: , Rfl:  .  promethazine-dextromethorphan (PROMETHAZINE-DM) 6.25-15 MG/5ML syrup, Take 2.5 mLs by mouth 4 (four) times daily as needed for cough. (Patient not taking: Reported on 03/13/2018), Disp: 118 mL, Rfl: 0   Physical exam:  Vitals:   04/16/18 1111  BP: (!) 126/54  Pulse: 67  Resp: 18  Temp: 97.9 F (36.6 C)  TempSrc: Tympanic  SpO2: 98%  Weight: 192 lb 9.6 oz (87.4 kg)  Height: 5' 5.5" (1.664 m)   Physical Exam  Constitutional: She is oriented to person, place, and time. She appears well-developed and well-nourished.  HENT:  Head: Normocephalic and atraumatic.  Eyes: Pupils are equal, round, and reactive to light. EOM are normal.  Neck: Normal range of motion.  Cardiovascular: Normal rate, regular rhythm and normal heart sounds.  Pulmonary/Chest: Effort normal and breath sounds normal.  Abdominal: Soft. Bowel sounds are normal.  Neurological: She is alert and oriented to person, place, and time.  Skin: Skin is warm and dry.       CMP Latest Ref Rng & Units  12/25/2017  Glucose 65 - 99 mg/dL 158(H)  BUN 8 - 27 mg/dL 25  Creatinine 0.57 - 1.00 mg/dL 1.43(H)  Sodium 134 - 144 mmol/L 140  Potassium 3.5 - 5.2 mmol/L 4.9  Chloride 96 - 106 mmol/L 104  CO2 20 - 29 mmol/L 18(L)  Calcium 8.7 - 10.3 mg/dL 9.2  Total Protein 6.0 - 8.5 g/dL 6.9  Total Bilirubin 0.0 - 1.2 mg/dL <0.2  Alkaline Phos 39 - 117 IU/L 110  AST 0 - 40 IU/L 10  ALT 0 - 32 IU/L 9   CBC Latest Ref Rng & Units 04/08/2018  WBC 3.6 - 11.0 K/uL 6.5  Hemoglobin 12.0 - 16.0 g/dL 10.6(L)  Hematocrit 35.0 - 47.0 % 32.0(L)  Platelets 150 - 440 K/uL 258     Assessment and plan- Patient is a 77 y.o. female referred for iron deficiency anemia  Recent labs from May 2019 showed that her hemoglobin is around her baseline of 10.  Iron studies were normal however her ferritin level was low at 15.  She has been on oral iron for the last 3 months.  I will therefore proceed with IV iron at this time.  Discussed risks and benefits of Feraheme 510 mg IV weekly x2 including all but not limited to headaches, leg swelling and possibility of infusion reactions.  Patient understands and agrees to proceed.  She does have a poor venous access and giving her IV iron may be challenging  I have asked her to continue her oral B12 or at least a multivitamin at this time.  Repeat CBC ferritin and iron studies in 2 months time and I will see her at that time following her EGD and colonoscopy   Thank you for this kind referral and the opportunity  to participate in the care of this patient   Visit Diagnosis 1. Iron deficiency anemia due to chronic blood loss     Dr. Randa Evens, MD, MPH [facility] [phone] 04/16/2018  1:13 PM

## 2018-04-17 ENCOUNTER — Inpatient Hospital Stay: Payer: Medicare HMO

## 2018-04-17 VITALS — BP 119/59 | HR 65 | Temp 96.0°F | Resp 20

## 2018-04-17 DIAGNOSIS — D500 Iron deficiency anemia secondary to blood loss (chronic): Secondary | ICD-10-CM

## 2018-04-17 DIAGNOSIS — D509 Iron deficiency anemia, unspecified: Secondary | ICD-10-CM | POA: Diagnosis not present

## 2018-04-17 MED ORDER — FERUMOXYTOL INJECTION 510 MG/17 ML
510.0000 mg | Freq: Once | INTRAVENOUS | Status: AC
  Administered 2018-04-17: 510 mg via INTRAVENOUS
  Filled 2018-04-17: qty 17

## 2018-04-17 MED ORDER — SODIUM CHLORIDE 0.9 % IV SOLN
Freq: Once | INTRAVENOUS | Status: AC
  Administered 2018-04-17: 14:00:00 via INTRAVENOUS
  Filled 2018-04-17: qty 1000

## 2018-04-18 ENCOUNTER — Ambulatory Visit (INDEPENDENT_AMBULATORY_CARE_PROVIDER_SITE_OTHER): Payer: Medicare HMO | Admitting: Family Medicine

## 2018-04-18 ENCOUNTER — Encounter: Payer: Self-pay | Admitting: Family Medicine

## 2018-04-18 VITALS — BP 136/56 | HR 62 | Temp 98.7°F | Resp 14 | Wt 193.0 lb

## 2018-04-18 DIAGNOSIS — D509 Iron deficiency anemia, unspecified: Secondary | ICD-10-CM

## 2018-04-18 DIAGNOSIS — E785 Hyperlipidemia, unspecified: Secondary | ICD-10-CM

## 2018-04-18 DIAGNOSIS — E039 Hypothyroidism, unspecified: Secondary | ICD-10-CM

## 2018-04-18 DIAGNOSIS — Z124 Encounter for screening for malignant neoplasm of cervix: Secondary | ICD-10-CM | POA: Diagnosis not present

## 2018-04-18 NOTE — Progress Notes (Signed)
Patient: Stacey Pierce Female    DOB: 09-Jun-1941   77 y.o.   MRN: [id number] Visit Date: 04/18/2018  Today's Provider: Wilhemena Durie, MD   Chief Complaint  Patient presents with  . Follow-up   Subjective:    HPI Pt is here for a follow up of her chronic problems. She recently found out she was anemic and having blood in her stool she is having the work up for that and is taking iron. She is follow by Endocrinology for her DM.     Allergies  Allergen Reactions  . Hydrocodone-Homatropine     Nausea/dizziness  . Azithromycin Rash    Other reaction(s): Unknown MYCIN GROUP   . Codeine Other (See Comments)    "COLIC"  . Erythromycin Rash  . Penicillins Rash    Has patient had a PCN reaction causing immediate rash, facial/tongue/throat swelling, SOB or lightheadedness with hypotension: No Has patient had a PCN reaction causing severe rash involving mucus membranes or skin necrosis: No Has patient had a PCN reaction that required hospitalization: No Has patient had a PCN reaction occurring within the last 10 years: No If all of the above answers are "NO", then may proceed with Cephalosporin use.   . Sulfa Antibiotics Other (See Comments)    "COLIC"     Current Outpatient Medications:  .  amitriptyline (ELAVIL) 10 MG tablet, TAKE ONE TABLET BY MOUTH AT BEDTIME, Disp: 90 tablet, Rfl: 3 .  amLODipine (NORVASC) 2.5 MG tablet, Take 1 tablet (2.5 mg total) by mouth daily., Disp: , Rfl:  .  atorvastatin (LIPITOR) 40 MG tablet, Take 1 tablet (40 mg total) by mouth daily., Disp: 90 tablet, Rfl: 3 .  BD INSULIN SYRINGE U/F 31G X 5/16" 1 ML MISC, 4 (four) times daily., Disp: , Rfl: 99 .  Blood Glucose Monitoring Suppl (FIFTY50 GLUCOSE METER 2.0) w/Device KIT, Use as directed., Disp: , Rfl:  .  carvedilol (COREG) 25 MG tablet, TAKE 1 TABLET BY MOUTH 2 TIMES DAILY WITH A MEAL., Disp: 180 tablet, Rfl: 3 .  clopidogrel (PLAVIX) 75 MG tablet, Take 1 tablet (75 mg total) by mouth  daily with breakfast., Disp: 30 tablet, Rfl: 6 .  DiphenhydrAMINE HCl ([employer] ALLERGY PO), Take 1 tablet by mouth daily., Disp: , Rfl:  .  dorzolamide-timolol (COSOPT) 22.3-6.8 MG/ML ophthalmic solution, Place 1 drop into both eyes 2 (two) times daily., Disp: , Rfl: 5 .  ferrous sulfate 325 (65 FE) MG tablet, Take 1 tablet (325 mg total) by mouth 2 (two) times daily with a meal., Disp: 180 tablet, Rfl: 0 .  glucose blood test strip, Use three test strips daily as instructed., Disp: , Rfl:  .  insulin aspart (NOVOLOG) 100 UNIT/ML injection, Inject 10-15 Units into the skin 2 (two) times daily before a meal. Takes 20 units before breakfast and 25 units before dinner., Disp: , Rfl:  .  insulin detemir (LEVEMIR) 100 UNIT/ML injection, Inject 20 units into the skin in the morning and 60 units at bedtime, Disp: , Rfl:  .  latanoprost (XALATAN) 0.005 % ophthalmic solution, Place 1 drop into both eyes at bedtime. , Disp: , Rfl:  .  levothyroxine (SYNTHROID, LEVOTHROID) 100 MCG tablet, TAKE ONE TABLET BY MOUTH ONCE DAILY. TAKE EARLY MORNING ON AN EMPTY STOMACH, Disp: 90 tablet, Rfl: 3 .  losartan (COZAAR) 100 MG tablet, TAKE 1 TABLET BY MOUTH EVERY DAY, Disp: 90 tablet, Rfl: 3 .  metFORMIN (GLUCOPHAGE) 1000 MG  tablet, Take 2,000 mg by mouth daily after supper., Disp: , Rfl:  .  pantoprazole (PROTONIX) 40 MG tablet, Take 1 tablet (40 mg total) by mouth daily at 12 noon., Disp: 90 tablet, Rfl: 3 .  acetaminophen (TYLENOL) 500 MG tablet, Take 1,000 mg by mouth every 6 (six) hours as needed for mild pain., Disp: , Rfl:  .  promethazine-dextromethorphan (PROMETHAZINE-DM) 6.25-15 MG/5ML syrup, Take 2.5 mLs by mouth 4 (four) times daily as needed for cough. (Patient not taking: Reported on 03/13/2018), Disp: 118 mL, Rfl: 0 .  vitamin B-12 (CYANOCOBALAMIN) 1000 MCG tablet, Take 1 tablet (1,000 mcg total) by mouth daily. (Patient not taking: Reported on 04/18/2018), Disp: 60 tablet, Rfl: 0  Review of Systems    Constitutional: Negative.   HENT: Negative.   Eyes: Negative.   Respiratory: Negative.   Cardiovascular: Negative.   Gastrointestinal: Negative.   Endocrine: Negative.   Genitourinary: Negative.   Musculoskeletal: Negative.   Skin: Negative.   Allergic/Immunologic: Negative.   Neurological: Positive for light-headedness (when laying flat).  Hematological: Negative.   Psychiatric/Behavioral: Negative.     Social History   Tobacco Use  . Smoking status: Never Smoker  . Smokeless tobacco: Never Used  Substance Use Topics  . Alcohol use: No    Alcohol/week: 0.0 oz   Objective:   BP (!) 136/56 (BP Location: Left Arm, Patient Position: Sitting, Cuff Size: Normal)   Pulse 62   Temp 98.7 F (37.1 C) (Oral)   Resp 14   Wt 193 lb (87.5 kg)   BMI 31.63 kg/m  Vitals:   04/18/18 1340  BP: (!) 136/56  Pulse: 62  Resp: 14  Temp: 98.7 F (37.1 C)  TempSrc: Oral  Weight: 193 lb (87.5 kg)     Physical Exam  Constitutional: She is oriented to person, place, and time. She appears well-developed and well-nourished.  HENT:  Head: Normocephalic and atraumatic.  Right Ear: External ear normal.  Left Ear: External ear normal.  Mouth/Throat: Oropharynx is clear and moist.  Eyes: Conjunctivae are normal. No scleral icterus.  Neck: No thyromegaly present.  Cardiovascular: Normal rate, regular rhythm and normal heart sounds.  Pulmonary/Chest: Effort normal and breath sounds normal.  Abdominal: Soft.  Musculoskeletal: She exhibits no edema.  Neurological: She is alert and oriented to person, place, and time.  Skin: Skin is warm and dry. Capillary refill takes more than 3 seconds.  Psychiatric: She has a normal mood and affect. Her behavior is normal. Judgment and thought content normal.        Assessment & Plan:     1. Adult hypothyroidism   2. Hyperlipidemia, unspecified hyperlipidemia type   3. Iron deficiency anemia, unspecified iron deficiency anemia type 4.S/p  aortic valve replacement 5.TIIDM      I have done the exam and reviewed the above chart and it is accurate to the best of my knowledge. Development worker, community has been used in this note in any air is in the dictation or transcription are unintentional.  Wilhemena Durie, MD  [place]

## 2018-04-19 ENCOUNTER — Other Ambulatory Visit: Payer: Self-pay | Admitting: Obstetrics and Gynecology

## 2018-04-19 DIAGNOSIS — Z1231 Encounter for screening mammogram for malignant neoplasm of breast: Secondary | ICD-10-CM

## 2018-04-19 DIAGNOSIS — Z794 Long term (current) use of insulin: Secondary | ICD-10-CM | POA: Diagnosis not present

## 2018-04-19 DIAGNOSIS — E1122 Type 2 diabetes mellitus with diabetic chronic kidney disease: Secondary | ICD-10-CM | POA: Diagnosis not present

## 2018-04-19 DIAGNOSIS — N183 Chronic kidney disease, stage 3 (moderate): Secondary | ICD-10-CM | POA: Diagnosis not present

## 2018-04-19 DIAGNOSIS — E1165 Type 2 diabetes mellitus with hyperglycemia: Secondary | ICD-10-CM | POA: Diagnosis not present

## 2018-04-24 ENCOUNTER — Inpatient Hospital Stay: Payer: Medicare HMO

## 2018-04-24 ENCOUNTER — Other Ambulatory Visit: Payer: Self-pay | Admitting: Family Medicine

## 2018-04-24 VITALS — BP 130/66 | HR 67 | Temp 96.4°F | Resp 18

## 2018-04-24 DIAGNOSIS — D500 Iron deficiency anemia secondary to blood loss (chronic): Secondary | ICD-10-CM

## 2018-04-24 DIAGNOSIS — D509 Iron deficiency anemia, unspecified: Secondary | ICD-10-CM | POA: Diagnosis not present

## 2018-04-24 MED ORDER — SODIUM CHLORIDE 0.9 % IV SOLN
Freq: Once | INTRAVENOUS | Status: AC
  Administered 2018-04-24: 14:00:00 via INTRAVENOUS
  Filled 2018-04-24: qty 1000

## 2018-04-24 MED ORDER — SODIUM CHLORIDE 0.9 % IV SOLN
510.0000 mg | Freq: Once | INTRAVENOUS | Status: AC
  Administered 2018-04-24: 510 mg via INTRAVENOUS
  Filled 2018-04-24: qty 17

## 2018-04-26 DIAGNOSIS — E1122 Type 2 diabetes mellitus with diabetic chronic kidney disease: Secondary | ICD-10-CM | POA: Diagnosis not present

## 2018-04-26 DIAGNOSIS — R809 Proteinuria, unspecified: Secondary | ICD-10-CM | POA: Diagnosis not present

## 2018-04-26 DIAGNOSIS — E11319 Type 2 diabetes mellitus with unspecified diabetic retinopathy without macular edema: Secondary | ICD-10-CM | POA: Diagnosis not present

## 2018-04-26 DIAGNOSIS — Z794 Long term (current) use of insulin: Secondary | ICD-10-CM | POA: Diagnosis not present

## 2018-04-26 DIAGNOSIS — N183 Chronic kidney disease, stage 3 (moderate): Secondary | ICD-10-CM | POA: Diagnosis not present

## 2018-04-26 DIAGNOSIS — E1129 Type 2 diabetes mellitus with other diabetic kidney complication: Secondary | ICD-10-CM | POA: Diagnosis not present

## 2018-05-02 ENCOUNTER — Other Ambulatory Visit: Payer: Self-pay

## 2018-05-02 ENCOUNTER — Telehealth: Payer: Self-pay

## 2018-05-02 DIAGNOSIS — D500 Iron deficiency anemia secondary to blood loss (chronic): Secondary | ICD-10-CM

## 2018-05-02 NOTE — Telephone Encounter (Signed)
Patient contacted office to schedule her EGD and Colonoscopy for I[facility] as discussed during office visit on 04/10/18.  She was provided with her colonoscopy instructions during office visit and still has them.  She is no longer on Plavix she completed therapy  About a week ago. She has been scheduled for 05/16/18 at [facility] for EGD and Colonoscopy.

## 2018-05-08 ENCOUNTER — Other Ambulatory Visit: Payer: Self-pay

## 2018-05-08 ENCOUNTER — Telehealth: Payer: Self-pay | Admitting: Gastroenterology

## 2018-05-08 MED ORDER — NA SULFATE-K SULFATE-MG SULF 17.5-3.13-1.6 GM/177ML PO SOLN: 1.0000 | Freq: Once | ORAL | 0 refills | Status: AC

## 2018-05-08 NOTE — Telephone Encounter (Signed)
Patient states she was returning my call.  Her date for colonoscopy has been scheduled for 05/16/18 she has her instructions.   Thanks [employer]

## 2018-05-08 NOTE — Telephone Encounter (Signed)
Pt is returning a call for [facility]

## 2018-05-10 ENCOUNTER — Other Ambulatory Visit: Payer: Self-pay | Admitting: Gastroenterology

## 2018-05-10 DIAGNOSIS — D519 Vitamin B12 deficiency anemia, unspecified: Secondary | ICD-10-CM

## 2018-05-16 ENCOUNTER — Encounter
Admission: RE | Disposition: A | Discharge status: Home / Self Care | Payer: Self-pay | Source: Ambulatory Visit | Attending: Gastroenterology

## 2018-05-16 ENCOUNTER — Encounter: Payer: Self-pay | Admitting: *Deleted

## 2018-05-16 ENCOUNTER — Ambulatory Visit: Payer: Medicare HMO | Admitting: Certified Registered"

## 2018-05-16 ENCOUNTER — Ambulatory Visit
Admission: RE | Admit: 2018-05-16 | Discharge: 2018-05-16 | Disposition: A | Discharge status: Home / Self Care | Payer: Medicare HMO | Source: Ambulatory Visit | Attending: Gastroenterology | Admitting: Gastroenterology

## 2018-05-16 DIAGNOSIS — Z794 Long term (current) use of insulin: Secondary | ICD-10-CM | POA: Insufficient documentation

## 2018-05-16 DIAGNOSIS — Z882 Allergy status to sulfonamides status: Secondary | ICD-10-CM | POA: Diagnosis not present

## 2018-05-16 DIAGNOSIS — I350 Nonrheumatic aortic (valve) stenosis: Secondary | ICD-10-CM | POA: Insufficient documentation

## 2018-05-16 DIAGNOSIS — Z6831 Body mass index (BMI) 31.0-31.9, adult: Secondary | ICD-10-CM | POA: Insufficient documentation

## 2018-05-16 DIAGNOSIS — Z9841 Cataract extraction status, right eye: Secondary | ICD-10-CM | POA: Insufficient documentation

## 2018-05-16 DIAGNOSIS — D124 Benign neoplasm of descending colon: Secondary | ICD-10-CM | POA: Insufficient documentation

## 2018-05-16 DIAGNOSIS — Z881 Allergy status to other antibiotic agents status: Secondary | ICD-10-CM | POA: Diagnosis not present

## 2018-05-16 DIAGNOSIS — E785 Hyperlipidemia, unspecified: Secondary | ICD-10-CM | POA: Insufficient documentation

## 2018-05-16 DIAGNOSIS — K648 Other hemorrhoids: Secondary | ICD-10-CM | POA: Diagnosis not present

## 2018-05-16 DIAGNOSIS — K635 Polyp of colon: Secondary | ICD-10-CM | POA: Diagnosis not present

## 2018-05-16 DIAGNOSIS — Z9842 Cataract extraction status, left eye: Secondary | ICD-10-CM | POA: Insufficient documentation

## 2018-05-16 DIAGNOSIS — Z885 Allergy status to narcotic agent status: Secondary | ICD-10-CM | POA: Diagnosis not present

## 2018-05-16 DIAGNOSIS — K219 Gastro-esophageal reflux disease without esophagitis: Secondary | ICD-10-CM | POA: Diagnosis not present

## 2018-05-16 DIAGNOSIS — D509 Iron deficiency anemia, unspecified: Secondary | ICD-10-CM | POA: Diagnosis not present

## 2018-05-16 DIAGNOSIS — E1122 Type 2 diabetes mellitus with diabetic chronic kidney disease: Secondary | ICD-10-CM | POA: Insufficient documentation

## 2018-05-16 DIAGNOSIS — Z888 Allergy status to other drugs, medicaments and biological substances status: Secondary | ICD-10-CM | POA: Insufficient documentation

## 2018-05-16 DIAGNOSIS — D500 Iron deficiency anemia secondary to blood loss (chronic): Secondary | ICD-10-CM | POA: Diagnosis not present

## 2018-05-16 DIAGNOSIS — K449 Diaphragmatic hernia without obstruction or gangrene: Secondary | ICD-10-CM | POA: Insufficient documentation

## 2018-05-16 DIAGNOSIS — E669 Obesity, unspecified: Secondary | ICD-10-CM | POA: Diagnosis not present

## 2018-05-16 DIAGNOSIS — E11319 Type 2 diabetes mellitus with unspecified diabetic retinopathy without macular edema: Secondary | ICD-10-CM | POA: Diagnosis not present

## 2018-05-16 DIAGNOSIS — Z9049 Acquired absence of other specified parts of digestive tract: Secondary | ICD-10-CM | POA: Insufficient documentation

## 2018-05-16 DIAGNOSIS — Z820 Family history of epilepsy and other diseases of the nervous system: Secondary | ICD-10-CM | POA: Insufficient documentation

## 2018-05-16 DIAGNOSIS — N189 Chronic kidney disease, unspecified: Secondary | ICD-10-CM | POA: Insufficient documentation

## 2018-05-16 DIAGNOSIS — N183 Chronic kidney disease, stage 3 (moderate): Secondary | ICD-10-CM | POA: Diagnosis not present

## 2018-05-16 DIAGNOSIS — Z79899 Other long term (current) drug therapy: Secondary | ICD-10-CM | POA: Insufficient documentation

## 2018-05-16 DIAGNOSIS — Z880 Allergy status to penicillin: Secondary | ICD-10-CM | POA: Diagnosis not present

## 2018-05-16 DIAGNOSIS — I2510 Atherosclerotic heart disease of native coronary artery without angina pectoris: Secondary | ICD-10-CM | POA: Diagnosis not present

## 2018-05-16 DIAGNOSIS — Z833 Family history of diabetes mellitus: Secondary | ICD-10-CM | POA: Insufficient documentation

## 2018-05-16 DIAGNOSIS — E039 Hypothyroidism, unspecified: Secondary | ICD-10-CM | POA: Insufficient documentation

## 2018-05-16 DIAGNOSIS — I129 Hypertensive chronic kidney disease with stage 1 through stage 4 chronic kidney disease, or unspecified chronic kidney disease: Secondary | ICD-10-CM | POA: Insufficient documentation

## 2018-05-16 DIAGNOSIS — H409 Unspecified glaucoma: Secondary | ICD-10-CM | POA: Diagnosis not present

## 2018-05-16 DIAGNOSIS — Z803 Family history of malignant neoplasm of breast: Secondary | ICD-10-CM | POA: Insufficient documentation

## 2018-05-16 DIAGNOSIS — Z8249 Family history of ischemic heart disease and other diseases of the circulatory system: Secondary | ICD-10-CM | POA: Insufficient documentation

## 2018-05-16 DIAGNOSIS — Z8371 Family history of colonic polyps: Secondary | ICD-10-CM | POA: Insufficient documentation

## 2018-05-16 HISTORY — PX: ESOPHAGOGASTRODUODENOSCOPY (EGD) WITH PROPOFOL: SHX5813

## 2018-05-16 HISTORY — PX: COLONOSCOPY WITH PROPOFOL: SHX5780

## 2018-05-16 LAB — GLUCOSE, CAPILLARY: GLUCOSE-CAPILLARY: 235 mg/dL — AB (ref 65–99)

## 2018-05-16 SURGERY — COLONOSCOPY WITH PROPOFOL
Anesthesia: General

## 2018-05-16 MED ORDER — SODIUM CHLORIDE 0.9 % IV SOLN
INTRAVENOUS | Status: DC | PRN
  Administered 2018-05-16: 1000 mL via INTRAVENOUS

## 2018-05-16 MED ORDER — PROPOFOL 500 MG/50ML IV EMUL
INTRAVENOUS | Status: DC | PRN
  Administered 2018-05-16: 100 ug/kg/min via INTRAVENOUS

## 2018-05-16 MED ORDER — PROPOFOL 10 MG/ML IV BOLUS
INTRAVENOUS | Status: DC | PRN
  Administered 2018-05-16 (×2): 100 mg via INTRAVENOUS

## 2018-05-16 MED ORDER — LIDOCAINE HCL (CARDIAC) PF 100 MG/5ML IV SOSY
PREFILLED_SYRINGE | INTRAVENOUS | Status: DC | PRN
  Administered 2018-05-16: 40 mg via INTRAVENOUS
  Administered 2018-05-16: 50 mg via INTRAVENOUS

## 2018-05-16 MED ORDER — PROPOFOL 500 MG/50ML IV EMUL
INTRAVENOUS | Status: AC
  Filled 2018-05-16: qty 50

## 2018-05-16 MED ORDER — GLYCOPYRROLATE 0.2 MG/ML IJ SOLN
INTRAMUSCULAR | Status: AC
  Filled 2018-05-16: qty 1

## 2018-05-16 MED ORDER — LIDOCAINE HCL (PF) 2 % IJ SOLN
INTRAMUSCULAR | Status: AC
  Filled 2018-05-16: qty 10

## 2018-05-16 MED ORDER — GLYCOPYRROLATE 0.2 MG/ML IJ SOLN
INTRAMUSCULAR | Status: DC | PRN
  Administered 2018-05-16: 0.1 mg via INTRAVENOUS

## 2018-05-16 MED ORDER — SODIUM CHLORIDE 0.9 % IV SOLN: INTRAVENOUS | Status: DC

## 2018-05-16 MED ORDER — PROPOFOL 10 MG/ML IV BOLUS
INTRAVENOUS | Status: AC
  Filled 2018-05-16: qty 20

## 2018-05-16 NOTE — Anesthesia Post-op Follow-up Note (Signed)
Anesthesia QCDR form completed.        

## 2018-05-16 NOTE — Transfer of Care (Signed)
Immediate Anesthesia Transfer of Care Note  Patient: Stacey Pierce  Procedure(s) Performed: COLONOSCOPY WITH PROPOFOL (N/A ) ESOPHAGOGASTRODUODENOSCOPY (EGD) WITH PROPOFOL (N/A )  Patient Location: PACU and Endoscopy Unit  Anesthesia Type:General  Level of Consciousness: drowsy and patient cooperative  Airway & Oxygen Therapy: Patient Spontanous Breathing  Post-op Assessment: Report given to RN, Post -op Vital signs reviewed and stable and Patient moving all extremities  Post vital signs: Reviewed and stable  Last Vitals:  Vitals Value Taken Time  BP    Temp    Pulse 68 05/16/2018 10:19 AM  Resp 12 05/16/2018 10:19 AM  SpO2 96 % 05/16/2018 10:19 AM  Vitals shown include unvalidated device data.  Last Pain:  Vitals:   05/16/18 0901  TempSrc: Tympanic  PainSc: 0-No pain         Complications: No apparent anesthesia complications

## 2018-05-16 NOTE — Op Note (Signed)
[facility] Gastroenterology Patient Name: Stacey Pierce Procedure Date: 05/16/2018 9:56 AM MRN: [id number] Account #: [id number] Date of Birth: 1941-03-16 Admit Type: Outpatient Age: 77 Room: [facility] ENDO ROOM 3 Gender: Female Note Status: Finalized Procedure:            Colonoscopy Indications:          Unexplained iron deficiency anemia Providers:            Lin Landsman MD, MD Referring MD:         Janine Ores. Rosanna Randy, MD (Referring MD) Medicines:            Monitored Anesthesia Care Complications:        No immediate complications. Estimated blood loss: None. Procedure:            Pre-Anesthesia Assessment:                       - Prior to the procedure, a History and Physical was                        performed, and patient medications and allergies were                        reviewed. The patient is competent. The risks and                        benefits of the procedure and the sedation options and                        risks were discussed with the patient. All questions                        were answered and informed consent was obtained.                        Patient identification and proposed procedure were                        verified by the physician, the nurse, the                        anesthesiologist, the anesthetist and the technician in                        the pre-procedure area in the procedure room in the                        endoscopy suite. Mental Status Examination: alert and                        oriented. Airway Examination: normal oropharyngeal                        airway and neck mobility. Respiratory Examination:                        clear to auscultation. CV Examination: normal.                        Prophylactic Antibiotics: The patient does not require  prophylactic antibiotics. Prior Anticoagulants: The                        patient has taken no previous anticoagulant or            antiplatelet agents. ASA Grade Assessment: III - A                        patient with severe systemic disease. After reviewing                        the risks and benefits, the patient was deemed in                        satisfactory condition to undergo the procedure. The                        anesthesia plan was to use monitored anesthesia care                        (MAC). Immediately prior to administration of                        medications, the patient was re-assessed for adequacy                        to receive sedatives. The heart rate, respiratory rate,                        oxygen saturations, blood pressure, adequacy of                        pulmonary ventilation, and response to care were                        monitored throughout the procedure. The physical status                        of the patient was re-assessed after the procedure.                       After obtaining informed consent, the colonoscope was                        passed under direct vision. Throughout the procedure,                        the patient's blood pressure, pulse, and oxygen                        saturations were monitored continuously. The                        Colonoscope was introduced through the anus and                        advanced to the the terminal ileum. The colonoscopy was                        performed without difficulty. The patient tolerated the  procedure well. The quality of the bowel preparation                        was evaluated using the BBPS [facility] Bowel Preparation                        Scale) with scores of: Right Colon = 3, Transverse                        Colon = 3 and Left Colon = 3 (entire mucosa seen well                        with no residual staining, small fragments of stool or                        opaque liquid). The total BBPS score equals 9. Findings:      The perianal and digital rectal examinations were  normal. Pertinent       negatives include normal sphincter tone and no palpable rectal lesions.      The terminal ileum appeared normal.      A diminutive polyp was found in the descending colon. The polyp was       sessile. The polyp was removed with a cold biopsy forceps. Resection and       retrieval were complete.      Non-bleeding internal hemorrhoids were found during endoscopy. The       hemorrhoids were medium-sized.      Unable to perform retroflexion due to short rectum Impression:           - The examined portion of the ileum was normal.                       - One diminutive polyp in the descending colon, removed                        with a cold biopsy forceps. Resected and retrieved.                       - Non-bleeding internal hemorrhoids. Recommendation:       - Discharge patient to home.                       - Resume previous diet today.                       - Continue present medications.                       - Await pathology results.                       - Repeat colonoscopy in 5-10 years for surveillance                        based on pathology results. Procedure Code(s):    --- Professional ---                       [phone], Colonoscopy, flexible; with biopsy, single or  multiple Diagnosis Code(s):    --- Professional ---                       K64.8, Other hemorrhoids                       D12.4, Benign neoplasm of descending colon                       D50.9, Iron deficiency anemia, unspecified CPT copyright 2017 American Medical Association. All rights reserved. The codes documented in this report are preliminary and upon coder review may  be revised to meet current compliance requirements. Dr. Ulyess Mort Lin Landsman MD, MD 05/16/2018 10:17:21 AM This report has been signed electronically. Number of Addenda: 0 Note Initiated On: 05/16/2018 9:56 AM Scope Withdrawal Time: 0 hours 9 minutes 11 seconds  Total Procedure  Duration: 0 hours 16 minutes 3 seconds       [facility]

## 2018-05-16 NOTE — H&P (Signed)
[name], MD [address]  [place]  [place], [place] [address]  Main: [phone]  Fax: [phone] Pager: [phone]  Primary Care Physician:  Jerrol Banana., MD Primary Gastroenterologist:  Dr. [name]  Pre-Procedure History & Physical: HPI:  Stacey Pierce is a 77 y.o. female is here for an endoscopy and colonoscopy.   Past Medical History:  Diagnosis Date  . Aortic stenosis, severe    a. 09/2017: s/p TAVR  . CKD (chronic kidney disease)   . Diabetes mellitus, insulin dependent (IDDM), uncontrolled ([place])   . DR (diabetic retinopathy) ([place])   . GERD (gastroesophageal reflux disease)   . Glaucoma   . Hyperlipidemia   . Hypertension   . Hypothyroidism   . Obesity   . S/P TAVR (transcatheter aortic valve replacement) 09/04/2017   26 mm Medtronic Evolut Pro transcatheter heart valve placed via percutaneous right transfemoral approach     Past Surgical History:  Procedure Laterality Date  . CATARACT EXTRACTION     left 2010  . CATARACT EXTRACTION W/PHACO Right 10/13/2015   Procedure: CATARACT EXTRACTION PHACO AND INTRAOCULAR LENS PLACEMENT (IOC);  Surgeon: Leandrew Koyanagi, MD;  Location: [place];  Service: Ophthalmology;  Laterality: Right;  DIABETIC - insulin and oral meds MALYUGIN SHUGARCAINE  . CHOLECYSTECTOMY     1967  . DILATION AND CURETTAGE OF UTERUS    . EYE SURGERY    . LAPAROSCOPIC TUBAL LIGATION    . RIGHT/LEFT HEART CATH AND CORONARY ANGIOGRAPHY N/A 08/02/2017   Procedure: RIGHT/LEFT HEART CATH AND CORONARY ANGIOGRAPHY;  Surgeon: Corey Skains, MD;  Location: [place] CV LAB;  Service: Cardiovascular;  Laterality: N/A;  . TEE WITHOUT CARDIOVERSION N/A 09/04/2017   Procedure: TRANSESOPHAGEAL ECHOCARDIOGRAM (TEE);  Surgeon: Sherren Mocha, MD;  Location: [place];  Service: Open Heart Surgery;  Laterality: N/A;  . TONSILLECTOMY     1974  . TRANSCATHETER AORTIC VALVE REPLACEMENT, TRANSFEMORAL N/A 09/04/2017   Procedure: TRANSCATHETER AORTIC VALVE REPLACEMENT, TRANSFEMORAL;  Surgeon: Sherren Mocha, MD;  Location: [place];  Service: Open Heart Surgery;  Laterality: N/A;  . TUBAL LIGATION      Prior to Admission medications   Medication Sig Start Date End Date Taking? Authorizing Provider  acetaminophen (TYLENOL) 500 MG tablet Take 1,000 mg by mouth every 6 (six) hours as needed for mild pain.   Yes [provider]  amLODipine (NORVASC) 2.5 MG tablet Take 1 tablet (2.5 mg total) by mouth daily. 09/27/17  Yes Eileen Stanford, PA-C  atorvastatin (LIPITOR) 40 MG tablet TAKE 1 TABLET (40 MG TOTAL) BY MOUTH DAILY. 04/24/18  Yes Jerrol Banana., MD  BD INSULIN SYRINGE U/F 31G X 5/16" 1 ML MISC 4 (four) times daily. 12/25/17  Yes [provider]  carvedilol (COREG) 25 MG tablet TAKE 1 TABLET BY MOUTH 2 TIMES DAILY WITH A MEAL. 03/27/18  Yes Jerrol Banana., MD  clopidogrel (PLAVIX) 75 MG tablet Take 1 tablet (75 mg total) by mouth daily with breakfast. 09/06/17  Yes Eileen Stanford, PA-C  DiphenhydrAMINE HCl ([employer] ALLERGY PO) Take 1 tablet by mouth daily.   Yes [provider]  dorzolamide-timolol (COSOPT) 22.3-6.8 MG/ML ophthalmic solution Place 1 drop into both eyes 2 (two) times daily. 07/10/16  Yes [provider]  ferrous sulfate 325 (65 FE) MG tablet Take 1 tablet (325 mg total) by mouth 2 (two) times daily with a meal. 03/13/18 06/11/18 Yes Olevia Westervelt, Tally Due, MD  glucose blood  test strip Use three test strips daily as instructed. 04/20/17  Yes [provider]  insulin aspart (NOVOLOG) 100 UNIT/ML injection Inject 10-15 Units into the skin 2 (two) times daily before a meal. Takes 20 units before breakfast and 25 units before dinner.   Yes [provider]  insulin detemir (LEVEMIR) 100 UNIT/ML injection Inject 20 units into the skin in the morning and 60 units at bedtime   Yes [provider]  latanoprost (XALATAN) 0.005 % ophthalmic  solution Place 1 drop into both eyes at bedtime.    Yes [provider]  levothyroxine (SYNTHROID, LEVOTHROID) 100 MCG tablet TAKE ONE TABLET BY MOUTH ONCE DAILY. TAKE EARLY MORNING ON AN EMPTY STOMACH 11/14/17  Yes Jerrol Banana., MD  losartan (COZAAR) 100 MG tablet TAKE 1 TABLET BY MOUTH EVERY DAY 08/08/17  Yes Jerrol Banana., MD  metFORMIN (GLUCOPHAGE) 1000 MG tablet Take 2,000 mg by mouth daily after supper. 12/31/15  Yes [provider]  pantoprazole (PROTONIX) 40 MG tablet Take 1 tablet (40 mg total) by mouth daily at 12 noon. 09/06/17  Yes Eileen Stanford, PA-C  promethazine-dextromethorphan (PROMETHAZINE-DM) 6.25-15 MG/5ML syrup Take 2.5 mLs by mouth 4 (four) times daily as needed for cough. 02/11/18  Yes Carmon Ginsberg, PA    Allergies as of 05/02/2018 - Review Complete 04/16/2018  Allergen Reaction Noted  . Hydrocodone-homatropine  02/11/2018  . Azithromycin Rash 02/17/2014  . Codeine Other (See Comments) 05/10/2015  . Erythromycin Rash 08/26/2015  . Penicillins Rash 05/10/2015  . Sulfa antibiotics Other (See Comments) 05/10/2015    Family History  Problem Relation Age of Onset  . Alzheimer's disease Mother   . Colon polyps Mother   . Hypertension Mother   . Heart attack Father   . Coronary artery disease Father   . Diabetes Father   . Breast cancer Sister   . Colon polyps Sister   . Diabetes Sister   . Diabetes Sister   . Diabetes Sister   . Diabetes Sister     Social History   Socioeconomic History  . Marital status: Married    Spouse name: Not on file  . Number of children: Not on file  . Years of education: Not on file  . Highest education level: Not on file  Occupational History  . Not on file  Social Needs  . Financial resource strain: Not on file  . Food insecurity:    Worry: Not on file    Inability: Not on file  . Transportation needs:    Medical: Not on file    Non-medical: Not on file  Tobacco Use  . Smoking  status: Never Smoker  . Smokeless tobacco: Never Used  Substance and Sexual Activity  . Alcohol use: No    Alcohol/week: 0.0 oz  . Drug use: Never  . Sexual activity: Not on file  Lifestyle  . Physical activity:    Days per week: Not on file    Minutes per session: Not on file  . Stress: Not on file  Relationships  . Social connections:    Talks on phone: Not on file    Gets together: Not on file    Attends religious service: Not on file    Active member of club or organization: Not on file    Attends meetings of clubs or organizations: Not on file    Relationship status: Not on file  . Intimate partner violence:    Fear of current or  ex partner: Not on file    Emotionally abused: Not on file    Physically abused: Not on file    Forced sexual activity: Not on file  Other Topics Concern  . Not on file  Social History Narrative  . Not on file    Review of Systems: See HPI, otherwise negative ROS  Physical Exam: BP (!) 217/67   Pulse 65   Temp (!) 96.5 F (35.8 C) (Tympanic)   Resp 18   Ht 5\' 5"  (1.651 m)   Wt 191 lb (86.6 kg)   SpO2 98%   BMI 31.78 kg/m  General:   Alert,  pleasant and cooperative in NAD Head:  Normocephalic and atraumatic. Neck:  Supple; no masses or thyromegaly. Lungs:  Clear throughout to auscultation.    Heart:  Regular rate and rhythm. Abdomen:  Soft, nontender and nondistended. Normal bowel sounds, without guarding, and without rebound.   Neurologic:  Alert and  oriented x4;  grossly normal neurologically.  Impression/Plan: Stacey Pierce is here for an endoscopy and colonoscopy to be performed for Iron deficiency anemia  Risks, benefits, limitations, and alternatives regarding  endoscopy and colonoscopy have been reviewed with the patient.  Questions have been answered.  All parties agreeable.   Sherri Sear, MD  05/16/2018, 9:30 AM

## 2018-05-16 NOTE — Anesthesia Postprocedure Evaluation (Signed)
Anesthesia Post Note  Patient: MERA GUNKEL  Procedure(s) Performed: COLONOSCOPY WITH PROPOFOL (N/A ) ESOPHAGOGASTRODUODENOSCOPY (EGD) WITH PROPOFOL (N/A )  Patient location during evaluation: Endoscopy Anesthesia Type: General Level of consciousness: awake and alert, oriented and patient cooperative Pain management: satisfactory to patient Vital Signs Assessment: post-procedure vital signs reviewed and stable Respiratory status: spontaneous breathing and respiratory function stable Cardiovascular status: blood pressure returned to baseline and stable Postop Assessment: no headache, no backache, patient able to bend at knees, no apparent nausea or vomiting, adequate PO intake and able to ambulate Anesthetic complications: no     Last Vitals:  Vitals:   05/16/18 0901  BP: (!) 217/67  Pulse: 65  Resp: 18  Temp: (!) 35.8 C  SpO2: 98%    Last Pain:  Vitals:   05/16/18 0901  TempSrc: Tympanic  PainSc: 0-No pain                 Jenin Birdsall H Mikesha Migliaccio

## 2018-05-16 NOTE — Anesthesia Preprocedure Evaluation (Signed)
Anesthesia Evaluation  Patient identified by MRN, date of birth, ID band Patient awake    Reviewed: Allergy & Precautions, H&P , NPO status , Patient's Chart, lab work & pertinent test results, reviewed documented beta blocker date and time   Airway Mallampati: II   Neck ROM: full    Dental  (+) Poor Dentition   Pulmonary neg pulmonary ROS, asthma ,    Pulmonary exam normal        Cardiovascular Exercise Tolerance: Poor hypertension, On Medications + CAD  negative cardio ROS Normal cardiovascular exam Rhythm:regular Rate:Normal     Neuro/Psych negative neurological ROS  negative psych ROS   GI/Hepatic negative GI ROS, Neg liver ROS, GERD  Medicated,  Endo/Other  negative endocrine ROSdiabetesHypothyroidism   Renal/GU Renal diseasenegative Renal ROS  negative genitourinary   Musculoskeletal   Abdominal   Peds  Hematology negative hematology ROS (+) anemia ,   Anesthesia Other Findings Past Medical History: No date: Aortic stenosis, severe     Comment:  a. 09/2017: s/p TAVR No date: CKD (chronic kidney disease) No date: Diabetes mellitus, insulin dependent (IDDM), uncontrolled  ([place]) No date: DR (diabetic retinopathy) ([place]) No date: GERD (gastroesophageal reflux disease) No date: Glaucoma No date: Hyperlipidemia No date: Hypertension No date: Hypothyroidism No date: Obesity 09/04/2017: S/P TAVR (transcatheter aortic valve replacement)     Comment:  26 mm Medtronic Evolut Pro transcatheter heart valve               placed via percutaneous right transfemoral approach  Past Surgical History: No date: CATARACT EXTRACTION     Comment:  left 2010 10/13/2015: CATARACT EXTRACTION W/PHACO; Right     Comment:  Procedure: CATARACT EXTRACTION PHACO AND INTRAOCULAR               LENS PLACEMENT (IOC);  Surgeon: Leandrew Koyanagi, MD;               Location: [place];  Service: Ophthalmology;    Laterality: Right;  DIABETIC - insulin and oral               meds MALYUGIN SHUGARCAINE No date: CHOLECYSTECTOMY     Comment:  1967 No date: DILATION AND CURETTAGE OF UTERUS No date: EYE SURGERY No date: LAPAROSCOPIC TUBAL LIGATION 08/02/2017: RIGHT/LEFT HEART CATH AND CORONARY ANGIOGRAPHY; N/A     Comment:  Procedure: RIGHT/LEFT HEART CATH AND CORONARY               ANGIOGRAPHY;  Surgeon: Corey Skains, MD;  Location:               [place] CV LAB;  Service: Cardiovascular;                Laterality: N/A; 09/04/2017: TEE WITHOUT CARDIOVERSION; N/A     Comment:  Procedure: TRANSESOPHAGEAL ECHOCARDIOGRAM (TEE);                Surgeon: Sherren Mocha, MD;  Location: [place];  Service:              Open Heart Surgery;  Laterality: N/A; No date: TONSILLECTOMY     Comment:  1974 09/04/2017: TRANSCATHETER AORTIC VALVE REPLACEMENT, TRANSFEMORAL; N/A     Comment:  Procedure: TRANSCATHETER AORTIC VALVE REPLACEMENT,               TRANSFEMORAL;  Surgeon: Sherren Mocha, MD;  Location:               [place];  Service:  Open Heart Surgery;  Laterality: N/A; No date: TUBAL LIGATION BMI    Body Mass Index:  31.78 kg/m     Reproductive/Obstetrics negative OB ROS                             Anesthesia Physical Anesthesia Plan  ASA: III  Anesthesia Plan: General   Post-op Pain Management:    Induction:   PONV Risk Score and Plan:   Airway Management Planned:   Additional Equipment:   Intra-op Plan:   Post-operative Plan:   Informed Consent: I have reviewed the patients History and Physical, chart, labs and discussed the procedure including the risks, benefits and alternatives for the proposed anesthesia with the patient or authorized representative who has indicated his/her understanding and acceptance.   Dental Advisory Given  Plan Discussed with: CRNA  Anesthesia Plan Comments:         Anesthesia Quick Evaluation

## 2018-05-16 NOTE — Op Note (Addendum)
[facility] Gastroenterology Patient Name: Stacey Pierce Procedure Date: 05/16/2018 9:43 AM MRN: [id number] Account #: [id number] Date of Birth: 07/31/1941 Admit Type: Outpatient Age: 77 Room: [facility] ENDO ROOM 3 Gender: Female Note Status: Finalized Procedure:            Upper GI endoscopy Indications:          Iron deficiency anemia secondary to chronic blood loss,                        Suspected upper gastrointestinal bleeding in patient                        with unexplained iron deficiency anemia Providers:            Lin Landsman MD, MD Referring MD:         Janine Ores. Rosanna Randy, MD (Referring MD) Medicines:            Monitored Anesthesia Care Complications:        No immediate complications. Estimated blood loss: None. Procedure:            Pre-Anesthesia Assessment:                       - Prior to the procedure, a History and Physical was                        performed, and patient medications and allergies were                        reviewed. The patient is competent. The risks and                        benefits of the procedure and the sedation options and                        risks were discussed with the patient. All questions                        were answered and informed consent was obtained.                        Patient identification and proposed procedure were                        verified by the physician, the nurse, the                        anesthesiologist, the anesthetist and the technician in                        the pre-procedure area in the procedure room in the                        endoscopy suite. Mental Status Examination: alert and                        oriented. Airway Examination: normal oropharyngeal                        airway and neck mobility. Respiratory Examination:  clear to auscultation. CV Examination: normal.                        Prophylactic Antibiotics: The patient does not  require                        prophylactic antibiotics. Prior Anticoagulants: The                        patient has taken Plavix (clopidogrel), last dose was                        15 days prior to procedure. ASA Grade Assessment: III -                        A patient with severe systemic disease. After reviewing                        the risks and benefits, the patient was deemed in                        satisfactory condition to undergo the procedure. The                        anesthesia plan was to use monitored anesthesia care                        (MAC). Immediately prior to administration of                        medications, the patient was re-assessed for adequacy                        to receive sedatives. The heart rate, respiratory rate,                        oxygen saturations, blood pressure, adequacy of                        pulmonary ventilation, and response to care were                        monitored throughout the procedure. The physical status                        of the patient was re-assessed after the procedure.                       After obtaining informed consent, the endoscope was                        passed under direct vision. Throughout the procedure,                        the patient's blood pressure, pulse, and oxygen                        saturations were monitored continuously. The Endoscope  was introduced through the mouth, and advanced to the                        second part of duodenum. The upper GI endoscopy was                        accomplished without difficulty. The patient tolerated                        the procedure well. Findings:      The duodenal bulb and second portion of the duodenum were normal.       Biopsies for histology were taken with a cold forceps for evaluation of       celiac disease.      The entire examined stomach was normal. Biopsies were taken with a cold       forceps for  Helicobacter pylori testing.      A small hiatal hernia was present.      The gastroesophageal junction and examined esophagus were normal. Impression:           - Normal duodenal bulb and second portion of the                        duodenum. Biopsied.                       - Normal stomach. Biopsied.                       - Small hiatal hernia.                       - Normal gastroesophageal junction and esophagus. Recommendation:       - Await pathology results.                       - Proceed with colonoscopy as scheduled                       - See colonoscopy report Procedure Code(s):    --- Professional ---                       [phone], Esophagogastroduodenoscopy, flexible, transoral;                        with biopsy, single or multiple Diagnosis Code(s):    --- Professional ---                       K44.9, Diaphragmatic hernia without obstruction or                        gangrene                       D50.0, Iron deficiency anemia secondary to blood loss                        (chronic)                       D50.9, Iron deficiency anemia, unspecified CPT copyright 2017 American Medical Association. All rights reserved. The codes documented in this report  are preliminary and upon coder review may  be revised to meet current compliance requirements. Dr. Ulyess Mort Lin Landsman MD, MD 05/16/2018 9:56:25 AM This report has been signed electronically. Number of Addenda: 0 Note Initiated On: 05/16/2018 9:43 AM      [facility]

## 2018-05-17 ENCOUNTER — Encounter (HOSPITAL_COMMUNITY): Payer: Self-pay

## 2018-05-17 NOTE — Progress Notes (Signed)
Telephone call to patient to offer services. Patient declined at this time.

## 2018-05-20 LAB — SURGICAL PATHOLOGY

## 2018-05-22 ENCOUNTER — Encounter: Payer: Self-pay | Admitting: Gastroenterology

## 2018-05-23 ENCOUNTER — Ambulatory Visit: Payer: Self-pay

## 2018-05-23 ENCOUNTER — Encounter: Payer: Self-pay | Admitting: Family Medicine

## 2018-05-24 ENCOUNTER — Ambulatory Visit
Admission: RE | Admit: 2018-05-24 | Discharge: 2018-05-24 | Disposition: A | Discharge status: Home / Self Care | Payer: Medicare HMO | Source: Ambulatory Visit | Attending: Obstetrics and Gynecology | Admitting: Obstetrics and Gynecology

## 2018-05-24 DIAGNOSIS — Z1231 Encounter for screening mammogram for malignant neoplasm of breast: Secondary | ICD-10-CM

## 2018-06-09 ENCOUNTER — Other Ambulatory Visit: Payer: Self-pay | Admitting: Gastroenterology

## 2018-06-09 DIAGNOSIS — D509 Iron deficiency anemia, unspecified: Secondary | ICD-10-CM

## 2018-06-13 DIAGNOSIS — I872 Venous insufficiency (chronic) (peripheral): Secondary | ICD-10-CM

## 2018-06-13 DIAGNOSIS — E782 Mixed hyperlipidemia: Secondary | ICD-10-CM | POA: Diagnosis not present

## 2018-06-13 DIAGNOSIS — I10 Essential (primary) hypertension: Secondary | ICD-10-CM | POA: Diagnosis not present

## 2018-06-13 DIAGNOSIS — I2510 Atherosclerotic heart disease of native coronary artery without angina pectoris: Secondary | ICD-10-CM | POA: Diagnosis not present

## 2018-06-13 DIAGNOSIS — I350 Nonrheumatic aortic (valve) stenosis: Secondary | ICD-10-CM | POA: Diagnosis not present

## 2018-06-13 HISTORY — DX: Venous insufficiency (chronic) (peripheral): I87.2

## 2018-06-20 DIAGNOSIS — E113293 Type 2 diabetes mellitus with mild nonproliferative diabetic retinopathy without macular edema, bilateral: Secondary | ICD-10-CM | POA: Diagnosis not present

## 2018-06-21 ENCOUNTER — Inpatient Hospital Stay: Payer: Medicare HMO

## 2018-06-21 ENCOUNTER — Other Ambulatory Visit: Payer: Self-pay

## 2018-06-21 ENCOUNTER — Inpatient Hospital Stay: Payer: Medicare HMO | Attending: Oncology | Admitting: Oncology

## 2018-06-21 VITALS — BP 135/58 | HR 62 | Temp 97.7°F | Resp 18 | Wt 192.3 lb

## 2018-06-21 DIAGNOSIS — D509 Iron deficiency anemia, unspecified: Secondary | ICD-10-CM | POA: Diagnosis not present

## 2018-06-21 DIAGNOSIS — K449 Diaphragmatic hernia without obstruction or gangrene: Secondary | ICD-10-CM | POA: Diagnosis not present

## 2018-06-21 DIAGNOSIS — D500 Iron deficiency anemia secondary to blood loss (chronic): Secondary | ICD-10-CM

## 2018-06-21 LAB — CBC
HEMATOCRIT: 33.9 % — AB (ref 35.0–47.0)
HEMOGLOBIN: 11.4 g/dL — AB (ref 12.0–16.0)
MCH: 29.6 pg (ref 26.0–34.0)
MCHC: 33.5 g/dL (ref 32.0–36.0)
MCV: 88.4 fL (ref 80.0–100.0)
Platelets: 200 10*3/uL (ref 150–440)
RBC: 3.84 MIL/uL (ref 3.80–5.20)
RDW: 14.6 % — ABNORMAL HIGH (ref 11.5–14.5)
WBC: 6 10*3/uL (ref 3.6–11.0)

## 2018-06-21 LAB — IRON AND TIBC
IRON: 89 ug/dL (ref 28–170)
Saturation Ratios: 25 % (ref 10.4–31.8)
TIBC: 352 ug/dL (ref 250–450)
UIBC: 263 ug/dL

## 2018-06-21 LAB — RETICULOCYTES
RBC.: 3.78 MIL/uL — AB (ref 3.80–5.20)
RETIC COUNT ABSOLUTE: 45.4 10*3/uL (ref 19.0–183.0)
RETIC CT PCT: 1.2 % (ref 0.4–3.1)

## 2018-06-21 LAB — FERRITIN: Ferritin: 171 ng/mL (ref 11–307)

## 2018-06-21 LAB — FOLATE: FOLATE: 35 ng/mL (ref >5.9)

## 2018-06-21 NOTE — Progress Notes (Signed)
Paitent states she has been under the weather this week - allergy symptoms - watery eyes, throat scratchy.  Patient states she is better today but not back to 100%.

## 2018-06-22 LAB — HAPTOGLOBIN: HAPTOGLOBIN: 196 mg/dL (ref 34–200)

## 2018-06-24 ENCOUNTER — Encounter: Payer: Self-pay | Admitting: Oncology

## 2018-06-24 NOTE — Progress Notes (Signed)
Hematology/Oncology Consult note [facility]  [phone] [phone]  Patient Care Team: Jerrol Banana., MD as PCP - General (Family Medicine) Corey Skains, MD as Consulting Physician (Internal Medicine) Rexene Alberts, MD as Consulting Physician (Cardiothoracic Surgery) Leandrew Koyanagi, MD as Referring Physician (Ophthalmology) Gabriel Carina Betsey Holiday, MD as Physician Assistant (Endocrinology) Benjaman Kindler, MD as Consulting Physician (Obstetrics and Gynecology) Lavonia Dana, MD as Consulting Physician (Internal Medicine)   Name of the patient: Stacey Pierce  [id number]  October 15, 1941   Date of visit: 06/24/18  Diagnosis- iron deficiency anemia of unclear etiology  Chief complaint/ Reason for visit-routine follow-up of iron deficiency anemia  Heme/Onc history: patient is a 77 year old female with a past medical history significant for obesity, hypertension, hyperlipidemia, diabetes who recently underwent TAVR procedure.  She has baseline chronic anemia and her hemoglobin is around 10.  Patient was found to have evidence of iron deficiency based on her labs in January 2019 and she was referred to Dr. Marius Ditch for further management.  She also had stool occult testing which came back positive.  She has been on oral iron since February 2019.  She was also taking thousand micrograms of oral B12 until recently.  With regards to her TAVR procedure she was on aspirin and Plavix.  Subsequently her aspirin was stopped and she will be coming off her Plavix in the next couple of weeks following which she will be getting an EGD and colonoscopy by Dr. Marius Ditch.  She has been referred to [place] for management of iron deficiency anemia.  Recent CBC on 04/08/2018 showed white count of 6.5, H&H of 10.6/32 with a platelet count of 258.  Iron studies were normal.  Ferritin level was low at 15.  B12 level was normal at 852.  Prior folate was normal in February  2019.  Patient denies any gross blood in her urine or stool.  She denies any consistent use of NSAIDs.  Patient received 2 doses of Feraheme in May 2019. She underwent EGD and colonoscopy in June 2019.  Colonoscopy revealed small polyp in the descending colon.  Nonbleeding internal hemorrhoids EGD showed normal duodenal bulb and normal stomach.  Small hiatal hernia.  Normal GE junction and esophagus.   Interval history-patient does not report any significant difference after receiving IV iron.  She denies any blood in her urine or stool.  ECOG PS- 1 Pain scale- 0 Opioid associated constipation- no  Review of systems- Review of Systems  Constitutional: Positive for malaise/fatigue. Negative for chills, fever and weight loss.  HENT: Negative for congestion, ear discharge and nosebleeds.   Eyes: Negative for blurred vision.  Respiratory: Negative for cough, hemoptysis, sputum production, shortness of breath and wheezing.   Cardiovascular: Negative for chest pain, palpitations, orthopnea and claudication.  Gastrointestinal: Negative for abdominal pain, blood in stool, constipation, diarrhea, heartburn, melena, nausea and vomiting.  Genitourinary: Negative for dysuria, flank pain, frequency, hematuria and urgency.  Musculoskeletal: Negative for back pain, joint pain and myalgias.  Skin: Negative for rash.  Neurological: Negative for dizziness, tingling, focal weakness, seizures, weakness and headaches.  Endo/Heme/Allergies: Does not bruise/bleed easily.  Psychiatric/Behavioral: Negative for depression and suicidal ideas. The patient does not have insomnia.       Allergies  Allergen Reactions  . Hydrocodone-Homatropine     Nausea/dizziness  . Azithromycin Rash    Other reaction(s): Unknown MYCIN GROUP   . Codeine Other (See Comments)    "COLIC"  . Erythromycin Rash  .  Penicillins Rash    Has patient had a PCN reaction causing immediate rash, facial/tongue/throat swelling, SOB or  lightheadedness with hypotension: No Has patient had a PCN reaction causing severe rash involving mucus membranes or skin necrosis: No Has patient had a PCN reaction that required hospitalization: No Has patient had a PCN reaction occurring within the last 10 years: No If all of the above answers are "NO", then may proceed with Cephalosporin use.   . Sulfa Antibiotics Other (See Comments)    "COLIC"     Past Medical History:  Diagnosis Date  . Aortic stenosis, severe    a. 09/2017: s/p TAVR  . CKD (chronic kidney disease)   . Diabetes mellitus, insulin dependent (IDDM), uncontrolled ([place])   . DR (diabetic retinopathy) ([place])   . GERD (gastroesophageal reflux disease)   . Glaucoma   . Hyperlipidemia   . Hypertension   . Hypothyroidism   . Obesity   . S/P TAVR (transcatheter aortic valve replacement) 09/04/2017   26 mm Medtronic Evolut Pro transcatheter heart valve placed via percutaneous right transfemoral approach      Past Surgical History:  Procedure Laterality Date  . CATARACT EXTRACTION     left 2010  . CATARACT EXTRACTION W/PHACO Right 10/13/2015   Procedure: CATARACT EXTRACTION PHACO AND INTRAOCULAR LENS PLACEMENT (IOC);  Surgeon: Leandrew Koyanagi, MD;  Location: [place];  Service: Ophthalmology;  Laterality: Right;  DIABETIC - insulin and oral meds MALYUGIN SHUGARCAINE  . CHOLECYSTECTOMY     1967  . COLONOSCOPY WITH PROPOFOL N/A 05/16/2018   Procedure: COLONOSCOPY WITH PROPOFOL;  Surgeon: Lin Landsman, MD;  Location: [facility] ENDOSCOPY;  Service: Gastroenterology;  Laterality: N/A;  . DILATION AND CURETTAGE OF UTERUS    . ESOPHAGOGASTRODUODENOSCOPY (EGD) WITH PROPOFOL N/A 05/16/2018   Procedure: ESOPHAGOGASTRODUODENOSCOPY (EGD) WITH PROPOFOL;  Surgeon: Lin Landsman, MD;  Location: [place];  Service: Gastroenterology;  Laterality: N/A;  . EYE SURGERY    . LAPAROSCOPIC TUBAL LIGATION    . RIGHT/LEFT HEART CATH AND CORONARY ANGIOGRAPHY N/A  08/02/2017   Procedure: RIGHT/LEFT HEART CATH AND CORONARY ANGIOGRAPHY;  Surgeon: Corey Skains, MD;  Location: [place] CV LAB;  Service: Cardiovascular;  Laterality: N/A;  . TEE WITHOUT CARDIOVERSION N/A 09/04/2017   Procedure: TRANSESOPHAGEAL ECHOCARDIOGRAM (TEE);  Surgeon: Sherren Mocha, MD;  Location: [place];  Service: Open Heart Surgery;  Laterality: N/A;  . TONSILLECTOMY     1974  . TRANSCATHETER AORTIC VALVE REPLACEMENT, TRANSFEMORAL N/A 09/04/2017   Procedure: TRANSCATHETER AORTIC VALVE REPLACEMENT, TRANSFEMORAL;  Surgeon: Sherren Mocha, MD;  Location: [place];  Service: Open Heart Surgery;  Laterality: N/A;  . TUBAL LIGATION      Social History   Socioeconomic History  . Marital status: Married    Spouse name: Not on file  . Number of children: Not on file  . Years of education: Not on file  . Highest education level: Not on file  Occupational History  . Not on file  Social Needs  . Financial resource strain: Not on file  . Food insecurity:    Worry: Not on file    Inability: Not on file  . Transportation needs:    Medical: Not on file    Non-medical: Not on file  Tobacco Use  . Smoking status: Never Smoker  . Smokeless tobacco: Never Used  Substance and Sexual Activity  . Alcohol use: No    Alcohol/week: 0.0 oz  . Drug use: Never  . Sexual activity: Not on  file  Lifestyle  . Physical activity:    Days per week: Not on file    Minutes per session: Not on file  . Stress: Not on file  Relationships  . Social connections:    Talks on phone: Not on file    Gets together: Not on file    Attends religious service: Not on file    Active member of club or organization: Not on file    Attends meetings of clubs or organizations: Not on file    Relationship status: Not on file  . Intimate partner violence:    Fear of current or ex partner: Not on file    Emotionally abused: Not on file    Physically abused: Not on file    Forced sexual activity: Not on file   Other Topics Concern  . Not on file  Social History Narrative  . Not on file    Family History  Problem Relation Age of Onset  . Alzheimer's disease Mother   . Colon polyps Mother   . Hypertension Mother   . Heart attack Father   . Coronary artery disease Father   . Diabetes Father   . Breast cancer Sister   . Colon polyps Sister   . Diabetes Sister   . Diabetes Sister   . Diabetes Sister   . Diabetes Sister      Current Outpatient Medications:  .  acetaminophen (TYLENOL) 500 MG tablet, Take 1,000 mg by mouth every 6 (six) hours as needed for mild pain., Disp: , Rfl:  .  amLODipine (NORVASC) 2.5 MG tablet, Take 1 tablet (2.5 mg total) by mouth daily., Disp: , Rfl:  .  atorvastatin (LIPITOR) 40 MG tablet, TAKE 1 TABLET (40 MG TOTAL) BY MOUTH DAILY., Disp: 90 tablet, Rfl: 3 .  BD INSULIN SYRINGE U/F 31G X 5/16" 1 ML MISC, 4 (four) times daily., Disp: , Rfl: 99 .  carvedilol (COREG) 25 MG tablet, TAKE 1 TABLET BY MOUTH 2 TIMES DAILY WITH A MEAL., Disp: 180 tablet, Rfl: 3 .  DiphenhydrAMINE HCl ([employer] ALLERGY PO), Take 1 tablet by mouth daily., Disp: , Rfl:  .  dorzolamide-timolol (COSOPT) 22.3-6.8 MG/ML ophthalmic solution, Place 1 drop into both eyes 2 (two) times daily., Disp: , Rfl: 5 .  glucose blood test strip, Use three test strips daily as instructed., Disp: , Rfl:  .  insulin aspart (NOVOLOG) 100 UNIT/ML injection, Inject 10-15 Units into the skin 2 (two) times daily before a meal. Takes 20 units before breakfast and 25 units before dinner., Disp: , Rfl:  .  insulin detemir (LEVEMIR) 100 UNIT/ML injection, Inject 20 units into the skin in the morning and 60 units at bedtime, Disp: , Rfl:  .  latanoprost (XALATAN) 0.005 % ophthalmic solution, Place 1 drop into both eyes at bedtime. , Disp: , Rfl:  .  levothyroxine (SYNTHROID, LEVOTHROID) 100 MCG tablet, TAKE ONE TABLET BY MOUTH ONCE DAILY. TAKE EARLY MORNING ON AN EMPTY STOMACH, Disp: 90 tablet, Rfl: 3 .  losartan (COZAAR) 100  MG tablet, TAKE 1 TABLET BY MOUTH EVERY DAY, Disp: 90 tablet, Rfl: 3 .  metFORMIN (GLUCOPHAGE) 1000 MG tablet, Take 2,000 mg by mouth daily after supper., Disp: , Rfl:  .  pantoprazole (PROTONIX) 40 MG tablet, Take 1 tablet (40 mg total) by mouth daily at 12 noon., Disp: 90 tablet, Rfl: 3 .  promethazine-dextromethorphan (PROMETHAZINE-DM) 6.25-15 MG/5ML syrup, Take 2.5 mLs by mouth 4 (four) times daily as needed for cough., Disp:  118 mL, Rfl: 0 .  ferrous sulfate (SM IRON) 325 (65 FE) MG tablet, Take 325 mg by mouth daily., Disp: , Rfl:   Physical exam:  Vitals:   06/21/18 1442  BP: (!) 135/58  Pulse: 62  Resp: 18  Temp: 97.7 F (36.5 C)  TempSrc: Tympanic  SpO2: 95%  Weight: 192 lb 5 oz (87.2 kg)   Physical Exam  Constitutional: She is oriented to person, place, and time. She appears well-developed and well-nourished.  HENT:  Head: Normocephalic and atraumatic.  Eyes: Pupils are equal, round, and reactive to light. EOM are normal.  Neck: Normal range of motion.  Cardiovascular: Normal rate, regular rhythm and normal heart sounds.  No murmur heard. Pulmonary/Chest: Effort normal and breath sounds normal.  Abdominal: Soft. Bowel sounds are normal.  Neurological: She is alert and oriented to person, place, and time.  Skin: Skin is warm and dry.     CMP Latest Ref Rng & Units 12/25/2017  Glucose 65 - 99 mg/dL 158(H)  BUN 8 - 27 mg/dL 25  Creatinine 0.57 - 1.00 mg/dL 1.43(H)  Sodium 134 - 144 mmol/L 140  Potassium 3.5 - 5.2 mmol/L 4.9  Chloride 96 - 106 mmol/L 104  CO2 20 - 29 mmol/L 18(L)  Calcium 8.7 - 10.3 mg/dL 9.2  Total Protein 6.0 - 8.5 g/dL 6.9  Total Bilirubin 0.0 - 1.2 mg/dL <0.2  Alkaline Phos 39 - 117 IU/L 110  AST 0 - 40 IU/L 10  ALT 0 - 32 IU/L 9   CBC Latest Ref Rng & Units 06/21/2018  WBC 3.6 - 11.0 K/uL 6.0  Hemoglobin 12.0 - 16.0 g/dL 11.4(L)  Hematocrit 35.0 - 47.0 % 33.9(L)  Platelets 150 - 440 K/uL 200    No images are attached to the  encounter.  No results found.   Assessment and plan- Patient is a 77 y.o. female with iron deficiency anemia of uncertain etiology  Patient received 2 doses of Feraheme in May 2019.  Her hemoglobin is a little better and up to 11.4 today.  Iron studies are within normal limits.  We will repeat CBC ferritin and iron studies in 3 in 6 months and I will see her back in 6 months.  I will defer the decision regarding capsule endoscopy to Dr. Marius Ditch since her EGD and colonoscopy did not reveal any source of bleeding.  It is possible that her hiatal hernia may be contributing to her iron deficiency anemia   Visit Diagnosis 1. Iron deficiency anemia, unspecified iron deficiency anemia type      Dr. Randa Evens, MD, MPH [facility] [phone] 06/24/2018 9:33 AM

## 2018-06-27 DIAGNOSIS — E1122 Type 2 diabetes mellitus with diabetic chronic kidney disease: Secondary | ICD-10-CM | POA: Diagnosis not present

## 2018-06-27 DIAGNOSIS — N183 Chronic kidney disease, stage 3 (moderate): Secondary | ICD-10-CM | POA: Diagnosis not present

## 2018-06-27 DIAGNOSIS — I129 Hypertensive chronic kidney disease with stage 1 through stage 4 chronic kidney disease, or unspecified chronic kidney disease: Secondary | ICD-10-CM | POA: Diagnosis not present

## 2018-06-27 DIAGNOSIS — R809 Proteinuria, unspecified: Secondary | ICD-10-CM | POA: Diagnosis not present

## 2018-06-27 DIAGNOSIS — D631 Anemia in chronic kidney disease: Secondary | ICD-10-CM | POA: Diagnosis not present

## 2018-07-16 DIAGNOSIS — R69 Illness, unspecified: Secondary | ICD-10-CM | POA: Diagnosis not present

## 2018-07-18 ENCOUNTER — Ambulatory Visit (INDEPENDENT_AMBULATORY_CARE_PROVIDER_SITE_OTHER): Payer: Medicare HMO

## 2018-07-18 ENCOUNTER — Ambulatory Visit (INDEPENDENT_AMBULATORY_CARE_PROVIDER_SITE_OTHER): Payer: Medicare HMO | Admitting: Family Medicine

## 2018-07-18 VITALS — BP 134/52 | HR 62 | Temp 98.4°F | Ht 66.0 in | Wt 196.2 lb

## 2018-07-18 VITALS — BP 134/52 | HR 62 | Temp 98.4°F | Resp 16 | Ht 66.0 in | Wt 196.0 lb

## 2018-07-18 DIAGNOSIS — Z0000 Encounter for general adult medical examination without abnormal findings: Secondary | ICD-10-CM

## 2018-07-18 DIAGNOSIS — E7800 Pure hypercholesterolemia, unspecified: Secondary | ICD-10-CM | POA: Diagnosis not present

## 2018-07-18 NOTE — Progress Notes (Signed)
Subjective:   Stacey Pierce is a 77 y.o. female who presents for Medicare Annual (Subsequent) preventive examination.  Review of Systems:  N/A  Cardiac Risk Factors include: advanced age (>59men, >41 women);diabetes mellitus;dyslipidemia;hypertension;obesity (BMI >30kg/m2)     Objective:     Vitals: BP (!) 134/52 (BP Location: Right Arm)   Pulse 62   Temp 98.4 F (36.9 C) (Oral)   Ht 5\' 6"  (1.676 m)   Wt 196 lb 3.2 oz (89 kg)   BMI 31.67 kg/m   Body mass index is 31.67 kg/m.  Advanced Directives 07/18/2018 06/21/2018 05/16/2018 04/16/2018 09/06/2017 08/14/2017 08/02/2017  Does Patient Have a Medical Advance Directive? No No No No No No No  Would patient like information on creating a medical advance directive? No - Patient declined - - No - Patient declined No - Patient declined No - Patient declined -    Tobacco Social History   Tobacco Use  Smoking Status Never Smoker  Smokeless Tobacco Never Used     Counseling given: Not Answered   Clinical Intake:  Pre-visit preparation completed: Yes  Pain : No/denies pain Pain Score: 0-No pain     Nutritional Status: BMI > 30  Obese Nutritional Risks: None Diabetes: Yes(type 2) CBG done?: No Did pt. bring in CBG monitor from home?: No  How often do you need to have someone help you when you read instructions, pamphlets, or other written materials from your doctor or pharmacy?: 1 - Never  Interpreter Needed?: No  Information entered by :: [facility], LPN  Past Medical History:  Diagnosis Date  . Anemia   . Aortic stenosis, severe    a. 09/2017: s/p TAVR  . CKD (chronic kidney disease)   . Diabetes mellitus, insulin dependent (IDDM), uncontrolled ([place])   . DR (diabetic retinopathy) ([place])   . GERD (gastroesophageal reflux disease)   . Glaucoma   . Hyperlipidemia   . Hypertension   . Hypothyroidism   . Obesity   . S/P TAVR (transcatheter aortic valve replacement) 09/04/2017   26 mm Medtronic Evolut Pro  transcatheter heart valve placed via percutaneous right transfemoral approach    Past Surgical History:  Procedure Laterality Date  . CATARACT EXTRACTION     left 2010  . CATARACT EXTRACTION W/PHACO Right 10/13/2015   Procedure: CATARACT EXTRACTION PHACO AND INTRAOCULAR LENS PLACEMENT (IOC);  Surgeon: Leandrew Koyanagi, MD;  Location: [place];  Service: Ophthalmology;  Laterality: Right;  DIABETIC - insulin and oral meds MALYUGIN SHUGARCAINE  . CHOLECYSTECTOMY     1967  . COLONOSCOPY WITH PROPOFOL N/A 05/16/2018   Procedure: COLONOSCOPY WITH PROPOFOL;  Surgeon: Lin Landsman, MD;  Location[facility] ENDOSCOPY;  Service: Gastroenterology;  Laterality: N/A;  . DILATION AND CURETTAGE OF UTERUS    . ESOPHAGOGASTRODUODENOSCOPY (EGD) WITH PROPOFOL N/A 05/16/2018   Procedure: ESOPHAGOGASTRODUODENOSCOPY (EGD) WITH PROPOFOL;  Surgeon: Lin Landsman, MD;  Location: [place];  Service: Gastroenterology;  Laterality: N/A;  . EYE SURGERY    . LAPAROSCOPIC TUBAL LIGATION    . RIGHT/LEFT HEART CATH AND CORONARY ANGIOGRAPHY N/A 08/02/2017   Procedure: RIGHT/LEFT HEART CATH AND CORONARY ANGIOGRAPHY;  Surgeon: Corey Skains, MD;  Location: [place] CV LAB;  Service: Cardiovascular;  Laterality: N/A;  . TEE WITHOUT CARDIOVERSION N/A 09/04/2017   Procedure: TRANSESOPHAGEAL ECHOCARDIOGRAM (TEE);  Surgeon: Sherren Mocha, MD;  Location: [place];  Service: Open Heart Surgery;  Laterality: N/A;  . TONSILLECTOMY     1974  . TRANSCATHETER AORTIC VALVE REPLACEMENT,  TRANSFEMORAL N/A 09/04/2017   Procedure: TRANSCATHETER AORTIC VALVE REPLACEMENT, TRANSFEMORAL;  Surgeon: Sherren Mocha, MD;  Location: [place];  Service: Open Heart Surgery;  Laterality: N/A;  . TUBAL LIGATION     Family History  Problem Relation Age of Onset  . Alzheimer's disease Mother   . Colon polyps Mother   . Hypertension Mother   . Heart attack Father   . Coronary artery disease Father   . Diabetes Father   .  Breast cancer Sister   . Colon polyps Sister   . Diabetes Sister   . Diabetes Sister   . Diabetes Sister   . Diabetes Sister    Social History   Socioeconomic History  . Marital status: Married    Spouse name: Not on file  . Number of children: 3  . Years of education: Not on file  . Highest education level: GED or equivalent  Occupational History  . Not on file  Social Needs  . Financial resource strain: Not hard at all  . Food insecurity:    Worry: Never true    Inability: Never true  . Transportation needs:    Medical: No    Non-medical: No  Tobacco Use  . Smoking status: Never Smoker  . Smokeless tobacco: Never Used  Substance and Sexual Activity  . Alcohol use: No    Alcohol/week: 0.0 standard drinks  . Drug use: Never  . Sexual activity: Not on file  Lifestyle  . Physical activity:    Days per week: Not on file    Minutes per session: Not on file  . Stress: Not at all  Relationships  . Social connections:    Talks on phone: Not on file    Gets together: Not on file    Attends religious service: Not on file    Active member of club or organization: Not on file    Attends meetings of clubs or organizations: Not on file    Relationship status: Not on file  Other Topics Concern  . Not on file  Social History Narrative  . Not on file    Outpatient Encounter Medications as of 07/18/2018  Medication Sig  . acetaminophen (TYLENOL) 500 MG tablet Take 1,000 mg by mouth every 6 (six) hours as needed for mild pain.  Marland Kitchen amLODipine (NORVASC) 2.5 MG tablet Take 1 tablet (2.5 mg total) by mouth daily.  Marland Kitchen aspirin EC 81 MG tablet Take 81 mg by mouth daily.  Marland Kitchen atorvastatin (LIPITOR) 40 MG tablet TAKE 1 TABLET (40 MG TOTAL) BY MOUTH DAILY.  . BD INSULIN SYRINGE U/F 31G X 5/16" 1 ML MISC 4 (four) times daily.  . carvedilol (COREG) 25 MG tablet TAKE 1 TABLET BY MOUTH 2 TIMES DAILY WITH A MEAL.  . DiphenhydrAMINE HCl ([employer] ALLERGY PO) Take 1 tablet by mouth daily.  .  dorzolamide-timolol (COSOPT) 22.3-6.8 MG/ML ophthalmic solution Place 1 drop into both eyes 2 (two) times daily.  Marland Kitchen glucose blood test strip Use three test strips daily as instructed.  . insulin aspart (NOVOLOG) 100 UNIT/ML injection Inject 20-25 Units into the skin 2 (two) times daily before a meal. Takes 20 units before breakfast and 25 units before dinner.  . insulin detemir (LEVEMIR) 100 UNIT/ML injection Inject 20 units into the skin in the morning and 55 units at bedtime  . latanoprost (XALATAN) 0.005 % ophthalmic solution Place 1 drop into both eyes at bedtime.   Marland Kitchen levothyroxine (SYNTHROID, LEVOTHROID) 100 MCG tablet TAKE ONE TABLET BY  MOUTH ONCE DAILY. TAKE EARLY MORNING ON AN EMPTY STOMACH  . losartan (COZAAR) 100 MG tablet TAKE 1 TABLET BY MOUTH EVERY DAY  . metFORMIN (GLUCOPHAGE) 1000 MG tablet Take 2,000 mg by mouth daily after supper.  . pantoprazole (PROTONIX) 40 MG tablet Take 1 tablet (40 mg total) by mouth daily at 12 noon.  . ferrous sulfate (SM IRON) 325 (65 FE) MG tablet Take 325 mg by mouth daily.  . promethazine-dextromethorphan (PROMETHAZINE-DM) 6.25-15 MG/5ML syrup Take 2.5 mLs by mouth 4 (four) times daily as needed for cough. (Patient not taking: Reported on 07/18/2018)   No facility-administered encounter medications on file as of 07/18/2018.     Activities of Daily Living In your present state of health, do you have any difficulty performing the following activities: 07/18/2018 09/06/2017  Hearing? N N  Vision? N N  Difficulty concentrating or making decisions? N N  Walking or climbing stairs? N Y  Comment Tries to avoid steps completely.  -  Dressing or bathing? N N  Doing errands, shopping? N N  Preparing Food and eating ? N -  Using the Toilet? N -  In the past six months, have you accidently leaked urine? N -  Do you have problems with loss of bowel control? N -  Managing your Medications? N -  Managing your Finances? N -  Housekeeping or managing your  Housekeeping? N -  Some recent data might be hidden    Patient Care Team: Jerrol Banana., MD as PCP - General (Family Medicine) Corey Skains, MD as Consulting Physician (Internal Medicine) Rexene Alberts, MD as Consulting Physician (Cardiothoracic Surgery) Leandrew Koyanagi, MD as Referring Physician (Ophthalmology) Solum, Betsey Holiday, MD as Physician Assistant (Endocrinology) Benjaman Kindler, MD as Consulting Physician (Obstetrics and Gynecology) Lavonia Dana, MD as Consulting Physician (Internal Medicine) Sindy Guadeloupe, MD as Consulting Physician (Oncology)    Assessment:   This is a routine wellness examination for [place].  Exercise Activities and Dietary recommendations Current Exercise Habits: Home exercise routine, Time (Minutes): > 60(2.5 hours), Frequency (Times/Week): 1, Weekly Exercise (Minutes/Week): 0, Exercise limited by: Other - see comments(bowling)  Goals    . DIET - REDUCE SUGAR INTAKE     Recommend decreasing amount of sugar and sweets intake to minimal if not none.     . Increase water intake     Recommend increasing water intake to 3 glasses a day.        Fall Risk Fall Risk  07/18/2018 04/24/2018 05/17/2017 11/02/2016 09/14/2016  Falls in the past year? Yes No No Yes (No Data)  Comment - - - - no falls since 07/27/16 due to low BG  Number falls in past yr: 1 - - 1 -  Injury with Fall? No - - No -  Comment - - - - -  Risk Factor Category  - - - - -  Risk for fall due to : - - - - -  Follow up Falls prevention discussed - - - -   Is the patient's home free of loose throw rugs in walkways, pet beds, electrical cords, etc?   yes      Grab bars in the bathroom? no      Handrails on the stairs?   no      Adequate lighting?   yes  Timed Get Up and Go performed: N/A  Depression Screen PHQ 2/9 Scores 07/18/2018 05/17/2017 05/17/2017 11/02/2016  PHQ - 2 Score 0 0 0 0  PHQ- 9 Score 0 3 - -     Cognitive Function: Pt declined the screening  today.     6CIT Screen 05/17/2017 11/02/2016  What Year? 0 points 0 points  What month? 0 points 0 points  What time? 0 points 0 points  Count back from 20 0 points 0 points  Months in reverse 0 points 0 points  Repeat phrase 2 points 0 points  Total Score 2 0    Immunization History  Administered Date(s) Administered  . Influenza, High Dose Seasonal PF 08/26/2015, 11/02/2016, 09/05/2017  . Pneumococcal Conjugate-13 02/25/2015  . Pneumococcal Polysaccharide-23 10/06/2013    Qualifies for Shingles Vaccine? Due for Shingles vaccine. Declined my offer to administer today. Education has been provided regarding the importance of this vaccine. Pt has been advised to call her insurance company to determine her out of pocket expense. Advised she may also receive this vaccine at her local pharmacy or Health Dept. Verbalized acceptance and understanding.  Screening Tests Health Maintenance  Topic Date Due  . INFLUENZA VACCINE  07/04/2018  . TETANUS/TDAP  12/04/2026 (Originally 12/11/1959)  . HEMOGLOBIN A1C  10/27/2018  . OPHTHALMOLOGY EXAM  03/29/2019  . FOOT EXAM  04/27/2019  . DEXA SCAN  Completed  . PNA vac Low Risk Adult  Completed    Cancer Screenings: Lung: Low Dose CT Chest recommended if Age 61-80 years, 30 pack-year currently smoking OR have quit w/in 15years. Patient does not qualify. Breast:  Up to date on Mammogram? Yes   Up to date of Bone Density/Dexa? Yes Colorectal: Up to date   Additional Screenings:  Hepatitis C Screening: N/A     Plan:  I have personally reviewed and addressed the Medicare Annual Wellness questionnaire and have noted the following in the patient's chart:  A. Medical and social history B. Use of alcohol, tobacco or illicit drugs  C. Current medications and supplements D. Functional ability and status E.  Nutritional status F.  Physical activity G. Advance directives H. List of other physicians I.  Hospitalizations, surgeries, and ER visits  in previous 12 months J.  [place] such as hearing and vision if needed, cognitive and depression L. Referrals and appointments - none  In addition, I have reviewed and discussed with patient certain preventive protocols, quality metrics, and best practice recommendations. A written personalized care plan for preventive services as well as general preventive health recommendations were provided to patient.  See attached scanned questionnaire for additional information.   Signed,  Fabio Neighbors, LPN Nurse Health Advisor   Nurse Recommendations: None.

## 2018-07-18 NOTE — Patient Instructions (Addendum)
Stacey Pierce , Thank you for taking time to come for your Medicare Wellness Visit. I appreciate your ongoing commitment to your health goals. Please review the following plan we discussed and let me know if I can assist you in the future.   Screening recommendations/referrals: Colonoscopy: Up to date Mammogram: Up to date Bone Density: Up to date Recommended yearly ophthalmology/optometry visit for glaucoma screening and checkup Recommended yearly dental visit for hygiene and checkup  Vaccinations: Influenza vaccine: Up to date Pneumococcal vaccine: Up to date Tdap vaccine: Pt declines today.  Shingles vaccine: Pt declines today.     Advanced directives: Advance directive discussed with you today. Even though you declined this today please call our office should you change your mind and we can give you the proper paperwork for you to fill out.  Conditions/risks identified: Fall risk prevention; Obesity/Diabetic - recommend decreasing amount of sugar and sweets intake to minimal if not none.   Next appointment: 2:00 PM today with Dr Rosanna Randy.    Preventive Care 56 Years and Older, Female Preventive care refers to lifestyle choices and visits with your health care provider that can promote health and wellness. What does preventive care include?  A yearly physical exam. This is also called an annual well check.  Dental exams once or twice a year.  Routine eye exams. Ask your health care provider how often you should have your eyes checked.  Personal lifestyle choices, including:  Daily care of your teeth and gums.  Regular physical activity.  Eating a healthy diet.  Avoiding tobacco and drug use.  Limiting alcohol use.  Practicing safe sex.  Taking low-dose aspirin every day.  Taking vitamin and mineral supplements as recommended by your health care provider. What happens during an annual well check? The services and screenings done by your health care provider during  your annual well check will depend on your age, overall health, lifestyle risk factors, and family history of disease. Counseling  Your health care provider may ask you questions about your:  Alcohol use.  Tobacco use.  Drug use.  Emotional well-being.  Home and relationship well-being.  Sexual activity.  Eating habits.  History of falls.  Memory and ability to understand (cognition).  Work and work Statistician.  Reproductive health. Screening  You may have the following tests or measurements:  Height, weight, and BMI.  Blood pressure.  Lipid and cholesterol levels. These may be checked every 5 years, or more frequently if you are over 83 years old.  Skin check.  Lung cancer screening. You may have this screening every year starting at age 46 if you have a 30-pack-year history of smoking and currently smoke or have quit within the past 15 years.  Fecal occult blood test (FOBT) of the stool. You may have this test every year starting at age 55.  Flexible sigmoidoscopy or colonoscopy. You may have a sigmoidoscopy every 5 years or a colonoscopy every 10 years starting at age 67.  Hepatitis C blood test.  Hepatitis B blood test.  Sexually transmitted disease (STD) testing.  Diabetes screening. This is done by checking your blood sugar (glucose) after you have not eaten for a while (fasting). You may have this done every 1-3 years.  Bone density scan. This is done to screen for osteoporosis. You may have this done starting at age 69.  Mammogram. This may be done every 1-2 years. Talk to your health care provider about how often you should have regular mammograms. Talk with  your health care provider about your test results, treatment options, and if necessary, the need for more tests. Vaccines  Your health care provider may recommend certain vaccines, such as:  Influenza vaccine. This is recommended every year.  Tetanus, diphtheria, and acellular pertussis (Tdap,  Td) vaccine. You may need a Td booster every 10 years.  Zoster vaccine. You may need this after age 62.  Pneumococcal 13-valent conjugate (PCV13) vaccine. One dose is recommended after age 48.  Pneumococcal polysaccharide (PPSV23) vaccine. One dose is recommended after age 83. Talk to your health care provider about which screenings and vaccines you need and how often you need them. This information is not intended to replace advice given to you by your health care provider. Make sure you discuss any questions you have with your health care provider. Document Released: 12/17/2015 Document Revised: 08/09/2016 Document Reviewed: 09/21/2015 Elsevier Interactive Patient Education  2017 [place] Prevention in the Home Falls can cause injuries. They can happen to people of all ages. There are many things you can do to make your home safe and to help prevent falls. What can I do on the outside of my home?  Regularly fix the edges of walkways and driveways and fix any cracks.  Remove anything that might make you trip as you walk through a door, such as a raised step or threshold.  Trim any bushes or trees on the path to your home.  Use bright outdoor lighting.  Clear any walking paths of anything that might make someone trip, such as rocks or tools.  Regularly check to see if handrails are loose or broken. Make sure that both sides of any steps have handrails.  Any raised decks and porches should have guardrails on the edges.  Have any leaves, snow, or ice cleared regularly.  Use sand or salt on walking paths during winter.  Clean up any spills in your garage right away. This includes oil or grease spills. What can I do in the bathroom?  Use night lights.  Install grab bars by the toilet and in the tub and shower. Do not use towel bars as grab bars.  Use non-skid mats or decals in the tub or shower.  If you need to sit down in the shower, use a plastic, non-slip  stool.  Keep the floor dry. Clean up any water that spills on the floor as soon as it happens.  Remove soap buildup in the tub or shower regularly.  Attach bath mats securely with double-sided non-slip rug tape.  Do not have throw rugs and other things on the floor that can make you trip. What can I do in the bedroom?  Use night lights.  Make sure that you have a light by your bed that is easy to reach.  Do not use any sheets or blankets that are too big for your bed. They should not hang down onto the floor.  Have a firm chair that has side arms. You can use this for support while you get dressed.  Do not have throw rugs and other things on the floor that can make you trip. What can I do in the kitchen?  Clean up any spills right away.  Avoid walking on wet floors.  Keep items that you use a lot in easy-to-reach places.  If you need to reach something above you, use a strong step stool that has a grab bar.  Keep electrical cords out of the way.  Do not  use floor polish or wax that makes floors slippery. If you must use wax, use non-skid floor wax.  Do not have throw rugs and other things on the floor that can make you trip. What can I do with my stairs?  Do not leave any items on the stairs.  Make sure that there are handrails on both sides of the stairs and use them. Fix handrails that are broken or loose. Make sure that handrails are as long as the stairways.  Check any carpeting to make sure that it is firmly attached to the stairs. Fix any carpet that is loose or worn.  Avoid having throw rugs at the top or bottom of the stairs. If you do have throw rugs, attach them to the floor with carpet tape.  Make sure that you have a light switch at the top of the stairs and the bottom of the stairs. If you do not have them, ask someone to add them for you. What else can I do to help prevent falls?  Wear shoes that:  Do not have high heels.  Have rubber bottoms.  Are  comfortable and fit you well.  Are closed at the toe. Do not wear sandals.  If you use a stepladder:  Make sure that it is fully opened. Do not climb a closed stepladder.  Make sure that both sides of the stepladder are locked into place.  Ask someone to hold it for you, if possible.  Clearly mark and make sure that you can see:  Any grab bars or handrails.  First and last steps.  Where the edge of each step is.  Use tools that help you move around (mobility aids) if they are needed. These include:  Canes.  Walkers.  Scooters.  Crutches.  Turn on the lights when you go into a dark area. Replace any light bulbs as soon as they burn out.  Set up your furniture so you have a clear path. Avoid moving your furniture around.  If any of your floors are uneven, fix them.  If there are any pets around you, be aware of where they are.  Review your medicines with your doctor. Some medicines can make you feel dizzy. This can increase your chance of falling. Ask your doctor what other things that you can do to help prevent falls. This information is not intended to replace advice given to you by your health care provider. Make sure you discuss any questions you have with your health care provider. Document Released: 09/16/2009 Document Revised: 04/27/2016 Document Reviewed: 12/25/2014 Elsevier Interactive Patient Education  2017 [employer].

## 2018-07-18 NOTE — Progress Notes (Signed)
Patient: Stacey Pierce, Female    DOB: 1941-11-17, 77 y.o.   MRN: [id number] Visit Date: 07/18/2018  Today's Provider: Wilhemena Durie, MD   Chief Complaint  Patient presents with  . Annual Exam   Subjective:  Stacey Pierce is a 77 y.o. female who presents today for health maintenance and complete physical. She feels well. She reports exercising none. She reports she is sleeping well.  Immunization History  Administered Date(s) Administered  . Influenza, High Dose Seasonal PF 08/26/2015, 11/02/2016, 09/05/2017  . Pneumococcal Conjugate-13 02/25/2015  . Pneumococcal Polysaccharide-23 10/06/2013   05/16/18 Colonoscopy, Dr Marius Ditch 05/24/18 Mammogram-negative 04/18/18 Female exam done through Dr Leafy Ro, Ob/Gyn   Review of Systems  Constitutional: Negative.   HENT: Negative.   Eyes: Negative.   Respiratory: Negative.   Cardiovascular: Negative.   Gastrointestinal: Negative.   Endocrine: Negative.   Genitourinary: Negative.   Musculoskeletal: Positive for arthralgias and back pain.  Skin: Negative.   Allergic/Immunologic: Negative.   Neurological: Negative.   Hematological: Negative.   Psychiatric/Behavioral: Negative.     Social History   Socioeconomic History  . Marital status: Married    Spouse name: Not on file  . Number of children: 3  . Years of education: Not on file  . Highest education level: GED or equivalent  Occupational History  . Not on file  Social Needs  . Financial resource strain: Not hard at all  . Food insecurity:    Worry: Never true    Inability: Never true  . Transportation needs:    Medical: No    Non-medical: No  Tobacco Use  . Smoking status: Never Smoker  . Smokeless tobacco: Never Used  Substance and Sexual Activity  . Alcohol use: No    Alcohol/week: 0.0 standard drinks  . Drug use: Never  . Sexual activity: Not on file  Lifestyle  . Physical activity:    Days per week: Not on file    Minutes per session: Not on file  .  Stress: Not at all  Relationships  . Social connections:    Talks on phone: Not on file    Gets together: Not on file    Attends religious service: Not on file    Active member of club or organization: Not on file    Attends meetings of clubs or organizations: Not on file    Relationship status: Not on file  . Intimate partner violence:    Fear of current or ex partner: Not on file    Emotionally abused: Not on file    Physically abused: Not on file    Forced sexual activity: Not on file  Other Topics Concern  . Not on file  Social History Narrative  . Not on file    Patient Active Problem List   Diagnosis Date Noted  . Iron deficiency anemia due to chronic blood loss 04/10/2018  . B12 deficiency anemia 03/13/2018  . Dizziness 01/10/2018  . Obesity   . S/P TAVR (transcatheter aortic valve replacement) 09/04/2017  . Aortic stenosis, severe 07/30/2017  . Allergic rhinitis 08/26/2015  . Airway hyperreactivity 08/26/2015  . Back pain, chronic 08/26/2015  . Diabetes ([place]) 08/26/2015  . Degeneration of lumbar or lumbosacral intervertebral disc 08/26/2015  . Acid reflux 08/26/2015  . Glaucoma 08/26/2015  . HLD (hyperlipidemia) 08/26/2015  . Adiposity 08/26/2015  . Benign essential HTN 04/29/2015  . Arteriosclerosis of coronary artery 01/26/2015  . Retinopathy 11/19/2014  . Chronic kidney disease (CKD), stage III (moderate) (  [place]) 11/19/2014  . Long-term insulin use ([place]) 11/19/2014  . Adult hypothyroidism 02/17/2014    Past Surgical History:  Procedure Laterality Date  . CATARACT EXTRACTION     left 2010  . CATARACT EXTRACTION W/PHACO Right 10/13/2015   Procedure: CATARACT EXTRACTION PHACO AND INTRAOCULAR LENS PLACEMENT (IOC);  Surgeon: Leandrew Koyanagi, MD;  Location: [place];  Service: Ophthalmology;  Laterality: Right;  DIABETIC - insulin and oral meds MALYUGIN SHUGARCAINE  . CHOLECYSTECTOMY     1967  . COLONOSCOPY WITH PROPOFOL N/A 05/16/2018    Procedure: COLONOSCOPY WITH PROPOFOL;  Surgeon: Lin Landsman, MD;  Location: [facility] ENDOSCOPY;  Service: Gastroenterology;  Laterality: N/A;  . DILATION AND CURETTAGE OF UTERUS    . ESOPHAGOGASTRODUODENOSCOPY (EGD) WITH PROPOFOL N/A 05/16/2018   Procedure: ESOPHAGOGASTRODUODENOSCOPY (EGD) WITH PROPOFOL;  Surgeon: Lin Landsman, MD;  Location: [place];  Service: Gastroenterology;  Laterality: N/A;  . EYE SURGERY    . LAPAROSCOPIC TUBAL LIGATION    . RIGHT/LEFT HEART CATH AND CORONARY ANGIOGRAPHY N/A 08/02/2017   Procedure: RIGHT/LEFT HEART CATH AND CORONARY ANGIOGRAPHY;  Surgeon: Corey Skains, MD;  Location: [place] CV LAB;  Service: Cardiovascular;  Laterality: N/A;  . TEE WITHOUT CARDIOVERSION N/A 09/04/2017   Procedure: TRANSESOPHAGEAL ECHOCARDIOGRAM (TEE);  Surgeon: Sherren Mocha, MD;  Location: [place];  Service: Open Heart Surgery;  Laterality: N/A;  . TONSILLECTOMY     1974  . TRANSCATHETER AORTIC VALVE REPLACEMENT, TRANSFEMORAL N/A 09/04/2017   Procedure: TRANSCATHETER AORTIC VALVE REPLACEMENT, TRANSFEMORAL;  Surgeon: Sherren Mocha, MD;  Location: [place];  Service: Open Heart Surgery;  Laterality: N/A;  . TUBAL LIGATION      Her family history includes Alzheimer's disease in her mother; Breast cancer in her sister; Colon polyps in her mother and sister; Coronary artery disease in her father; Diabetes in her father, sister, sister, sister, and sister; Heart attack in her father; Hypertension in her mother.     Outpatient Encounter Medications as of 07/18/2018  Medication Sig Note  . acetaminophen (TYLENOL) 500 MG tablet Take 1,000 mg by mouth every 6 (six) hours as needed for mild pain.   Marland Kitchen amLODipine (NORVASC) 2.5 MG tablet Take 1 tablet (2.5 mg total) by mouth daily.   Marland Kitchen aspirin EC 81 MG tablet Take 81 mg by mouth daily.   Marland Kitchen atorvastatin (LIPITOR) 40 MG tablet TAKE 1 TABLET (40 MG TOTAL) BY MOUTH DAILY.   . BD INSULIN SYRINGE U/F 31G X 5/16" 1 ML MISC 4  (four) times daily.   . carvedilol (COREG) 25 MG tablet TAKE 1 TABLET BY MOUTH 2 TIMES DAILY WITH A MEAL.   . DiphenhydrAMINE HCl ([employer] ALLERGY PO) Take 1 tablet by mouth daily.   . dorzolamide-timolol (COSOPT) 22.3-6.8 MG/ML ophthalmic solution Place 1 drop into both eyes 2 (two) times daily.   Marland Kitchen glucose blood test strip Use three test strips daily as instructed.   . insulin aspart (NOVOLOG) 100 UNIT/ML injection Inject 20-25 Units into the skin 2 (two) times daily before a meal. Takes 20 units before breakfast and 25 units before dinner.   . insulin detemir (LEVEMIR) 100 UNIT/ML injection Inject 20 units into the skin in the morning and 55 units at bedtime   . latanoprost (XALATAN) 0.005 % ophthalmic solution Place 1 drop into both eyes at bedtime.    Marland Kitchen levothyroxine (SYNTHROID, LEVOTHROID) 100 MCG tablet TAKE ONE TABLET BY MOUTH ONCE DAILY. TAKE EARLY MORNING ON AN EMPTY STOMACH   . losartan (COZAAR) 100  MG tablet TAKE 1 TABLET BY MOUTH EVERY DAY   . metFORMIN (GLUCOPHAGE) 1000 MG tablet Take 2,000 mg by mouth daily after supper.   . pantoprazole (PROTONIX) 40 MG tablet Take 1 tablet (40 mg total) by mouth daily at 12 noon.   . ferrous sulfate (SM IRON) 325 (65 FE) MG tablet Take 325 mg by mouth daily. 06/21/2018: Currently on hold.  . promethazine-dextromethorphan (PROMETHAZINE-DM) 6.25-15 MG/5ML syrup Take 2.5 mLs by mouth 4 (four) times daily as needed for cough. (Patient not taking: Reported on 07/18/2018)    No facility-administered encounter medications on file as of 07/18/2018.     Patient Care Team: Jerrol Banana., MD as PCP - General (Family Medicine) Corey Skains, MD as Consulting Physician (Internal Medicine) Rexene Alberts, MD as Consulting Physician (Cardiothoracic Surgery) Leandrew Koyanagi, MD as Referring Physician (Ophthalmology) Gabriel Carina, Betsey Holiday, MD as Physician Assistant (Endocrinology) Benjaman Kindler, MD as Consulting Physician (Obstetrics and  Gynecology) Lavonia Dana, MD as Consulting Physician (Internal Medicine) Sindy Guadeloupe, MD as Consulting Physician (Oncology)      Objective:   Vitals:  Vitals:   07/18/18 1353  BP: (!) 134/52  Pulse: 62  Resp: 16  Temp: 98.4 F (36.9 C)  TempSrc: Oral  Weight: 196 lb (88.9 kg)  Height: 5\' 6"  (1.676 m)    Physical Exam  Constitutional: She is oriented to person, place, and time. She appears well-developed and well-nourished.  HENT:  Head: Normocephalic and atraumatic.  Right Ear: External ear normal.  Left Ear: External ear normal.  Nose: Nose normal.  Mouth/Throat: Oropharynx is clear and moist.  Eyes: Pupils are equal, round, and reactive to light. Conjunctivae and EOM are normal.  Neck: Normal range of motion. Neck supple.  Cardiovascular: Normal rate, regular rhythm, normal heart sounds and intact distal pulses.  Pulmonary/Chest: Effort normal and breath sounds normal.  Abdominal: Soft. Bowel sounds are normal.  Neurological: She is alert and oriented to person, place, and time.  Skin: Skin is warm and dry.  Psychiatric: She has a normal mood and affect. Her behavior is normal. Judgment and thought content normal.     Depression Screen PHQ 2/9 Scores 07/18/2018 05/17/2017 05/17/2017 11/02/2016  PHQ - 2 Score 0 0 0 0  PHQ- 9 Score 0 3 - -      Assessment & Plan:     Routine Health Maintenance and Physical Exam  Exercise Activities and Dietary recommendations Goals    . DIET - REDUCE SUGAR INTAKE     Recommend decreasing amount of sugar and sweets intake to minimal if not none.     . Increase water intake     Recommend increasing water intake to 3 glasses a day.        Immunization History  Administered Date(s) Administered  . Influenza, High Dose Seasonal PF 08/26/2015, 11/02/2016, 09/05/2017  . Pneumococcal Conjugate-13 02/25/2015  . Pneumococcal Polysaccharide-23 10/06/2013    Health Maintenance  Topic Date Due  . INFLUENZA VACCINE   07/04/2018  . TETANUS/TDAP  12/04/2026 (Originally 12/11/1958)  . HEMOGLOBIN A1C  10/27/2018  . OPHTHALMOLOGY EXAM  03/29/2019  . FOOT EXAM  04/27/2019  . DEXA SCAN  Completed  . PNA vac Low Risk Adult  Completed    GYN per Dr Leafy Ro. Discussed health benefits of physical activity, and encouraged her to engage in regular exercise appropriate for her age and condition.  RTC 6 months.   I have done the exam and reviewed the chart and  it is accurate to the best of my knowledge. Development worker, community has been used and  any errors in dictation or transcription are unintentional. Miguel Aschoff M.D. [facility]

## 2018-07-25 DIAGNOSIS — L820 Inflamed seborrheic keratosis: Secondary | ICD-10-CM | POA: Diagnosis not present

## 2018-07-25 DIAGNOSIS — L570 Actinic keratosis: Secondary | ICD-10-CM | POA: Diagnosis not present

## 2018-07-25 DIAGNOSIS — B078 Other viral warts: Secondary | ICD-10-CM | POA: Diagnosis not present

## 2018-07-25 DIAGNOSIS — L853 Xerosis cutis: Secondary | ICD-10-CM | POA: Diagnosis not present

## 2018-07-25 DIAGNOSIS — L578 Other skin changes due to chronic exposure to nonionizing radiation: Secondary | ICD-10-CM | POA: Diagnosis not present

## 2018-07-25 DIAGNOSIS — L821 Other seborrheic keratosis: Secondary | ICD-10-CM | POA: Diagnosis not present

## 2018-07-26 DIAGNOSIS — N183 Chronic kidney disease, stage 3 (moderate): Secondary | ICD-10-CM | POA: Diagnosis not present

## 2018-07-26 DIAGNOSIS — E1122 Type 2 diabetes mellitus with diabetic chronic kidney disease: Secondary | ICD-10-CM | POA: Diagnosis not present

## 2018-07-26 DIAGNOSIS — Z794 Long term (current) use of insulin: Secondary | ICD-10-CM | POA: Diagnosis not present

## 2018-08-02 DIAGNOSIS — E1122 Type 2 diabetes mellitus with diabetic chronic kidney disease: Secondary | ICD-10-CM | POA: Diagnosis not present

## 2018-08-02 DIAGNOSIS — N183 Chronic kidney disease, stage 3 (moderate): Secondary | ICD-10-CM | POA: Diagnosis not present

## 2018-08-02 DIAGNOSIS — E1142 Type 2 diabetes mellitus with diabetic polyneuropathy: Secondary | ICD-10-CM | POA: Diagnosis not present

## 2018-08-02 DIAGNOSIS — E1129 Type 2 diabetes mellitus with other diabetic kidney complication: Secondary | ICD-10-CM | POA: Diagnosis not present

## 2018-08-02 DIAGNOSIS — Z794 Long term (current) use of insulin: Secondary | ICD-10-CM | POA: Diagnosis not present

## 2018-08-02 DIAGNOSIS — E11319 Type 2 diabetes mellitus with unspecified diabetic retinopathy without macular edema: Secondary | ICD-10-CM | POA: Diagnosis not present

## 2018-08-02 DIAGNOSIS — R809 Proteinuria, unspecified: Secondary | ICD-10-CM | POA: Diagnosis not present

## 2018-08-04 ENCOUNTER — Other Ambulatory Visit: Payer: Self-pay | Admitting: Family Medicine

## 2018-08-12 DIAGNOSIS — N644 Mastodynia: Secondary | ICD-10-CM | POA: Diagnosis not present

## 2018-08-14 ENCOUNTER — Other Ambulatory Visit: Payer: Self-pay | Admitting: Family Medicine

## 2018-08-22 ENCOUNTER — Other Ambulatory Visit: Payer: Self-pay

## 2018-08-22 DIAGNOSIS — D509 Iron deficiency anemia, unspecified: Secondary | ICD-10-CM

## 2018-08-28 ENCOUNTER — Telehealth: Payer: Self-pay | Admitting: Gastroenterology

## 2018-08-28 NOTE — Telephone Encounter (Signed)
Pt would like to cancel endo proc on 10/2. Pls call

## 2018-08-28 NOTE — Telephone Encounter (Signed)
Pt  Left vm she was not  Aware she is scheduled for capsule study and needs to change the date

## 2018-08-29 NOTE — Telephone Encounter (Signed)
LVM notifying pt og endo unit phone number to reschedule appt

## 2018-09-03 DIAGNOSIS — E7800 Pure hypercholesterolemia, unspecified: Secondary | ICD-10-CM | POA: Diagnosis not present

## 2018-09-04 LAB — LIPID PANEL WITH LDL/HDL RATIO
Cholesterol, Total: 160 mg/dL (ref 100–199)
HDL: 40 mg/dL (ref >39)
LDL Calculated: 81 mg/dL (ref 0–99)
LDl/HDL Ratio: 2 ratio (ref 0.0–3.2)
TRIGLYCERIDES: 195 mg/dL — AB (ref 0–149)
VLDL Cholesterol Cal: 39 mg/dL (ref 5–40)

## 2018-09-05 ENCOUNTER — Ambulatory Visit: Admit: 2018-09-05 | Payer: Medicare HMO | Admitting: Gastroenterology

## 2018-09-05 SURGERY — IMAGING PROCEDURE, GI TRACT, INTRALUMINAL, VIA CAPSULE
Anesthesia: General

## 2018-09-19 ENCOUNTER — Ambulatory Visit (HOSPITAL_COMMUNITY): Payer: Medicare HMO | Attending: Cardiovascular Disease

## 2018-09-19 ENCOUNTER — Encounter: Payer: Self-pay | Admitting: Physician Assistant

## 2018-09-19 ENCOUNTER — Ambulatory Visit: Payer: Medicare HMO | Admitting: Physician Assistant

## 2018-09-19 ENCOUNTER — Other Ambulatory Visit: Payer: Self-pay

## 2018-09-19 VITALS — BP 142/68 | HR 61 | Ht 66.0 in | Wt 197.0 lb

## 2018-09-19 DIAGNOSIS — E785 Hyperlipidemia, unspecified: Secondary | ICD-10-CM

## 2018-09-19 DIAGNOSIS — Z952 Presence of prosthetic heart valve: Secondary | ICD-10-CM

## 2018-09-19 DIAGNOSIS — I10 Essential (primary) hypertension: Secondary | ICD-10-CM

## 2018-09-19 DIAGNOSIS — D509 Iron deficiency anemia, unspecified: Secondary | ICD-10-CM

## 2018-09-19 MED ORDER — CLINDAMYCIN HCL 300 MG PO CAPS: ORAL_CAPSULE | ORAL | 6 refills | Status: DC

## 2018-09-19 NOTE — Patient Instructions (Addendum)
Medication Instructions:  Your provider recommends that you continue on your current medications as directed. Please refer to the Current Medication list given to you today.    Labwork: None  Testing/Procedures: None  Follow-Up: Please keep follow-up with Dr. Nehemiah Massed.  Your provider recommends that you schedule a follow-up appointment AS NEEDED with the Structural Heart Team!

## 2018-09-19 NOTE — Progress Notes (Signed)
HE[facility] AND [place]                                       Cardiology Office Note    Date:  09/20/2018   ID:  Stacey Pierce, DOB Jan 08, 1941, MRN [id number]  PCP:  Jerrol Banana., MD  Cardiologist: Dr. Nehemiah Massed / Dr. Burt Knack (TAVR)  CC: 1 year s/p TAVR   History of Present Illness:  Stacey Pierce is a 77 y.o. female with a history of HTN, HLD, hypothyroidism, obesity, IDDM, CKD, and severe aortic stenosis s/p TAVR (09/04/17) who presents to clinic for follow up.   In 05/2015 she had asymptomatic moderate aortic stenosis with normal LV systolic function and was followed. She was seen by Dr. Nehemiah Massed in 04/2017 and an echo then showed progression to severe AS with a mean gradient of 35 mm Hg and a peak of 58 mm Hg. LVEF remained 60%. There was moderate AI and MR. She reported having slowed down over that past year with exertional shortness of breath and fatigue occurring with bending over or walking any distance. She underwent cath on 08/02/2017 showing non-obstructive CAD and severe AS with a mean gradient of 32 mm Hg and normal EF.  She underwent successful TAVR with a 45mm Medtronic Evolut Pro via R TF approach on 09/04/17. Initial ECG with new LBBB. Post operative echo 10/3 showed good valve placement with mean gradient of 38mm Hg and no PVL. She was discharged on ASA/plavix.   1 month echo showed EF 60%, normally functioning TAVR valve with no significant PVL and a mean gradient of 11 mmHg.  The patient had issues with dizziness and was diagnosed with iron deficiency anemia.  Antiplatelet medications were adjusted.  She has been started on iron supplementation.  Today she presents to clinic for follow-up. No CP or SOB. She has left lower extremity swelling. No orthopnea or PND. No dizziness or syncope. No blood in stool or urine. No palpitations. She is now on iron and has a hemoglobin check tomorrow.  She is not very active but is able  to go bowling and go to the grocery store with no issues.   Past Medical History:  Diagnosis Date  . Anemia   . Aortic stenosis, severe    a. 09/2017: s/p TAVR  . CKD (chronic kidney disease)   . Diabetes mellitus, insulin dependent (IDDM), uncontrolled ([place])   . DR (diabetic retinopathy) ([place])   . GERD (gastroesophageal reflux disease)   . Glaucoma   . Hyperlipidemia   . Hypertension   . Hypothyroidism   . Obesity   . S/P TAVR (transcatheter aortic valve replacement) 09/04/2017   26 mm Medtronic Evolut Pro transcatheter heart valve placed via percutaneous right transfemoral approach     Past Surgical History:  Procedure Laterality Date  . CATARACT EXTRACTION     left 2010  . CATARACT EXTRACTION W/PHACO Right 10/13/2015   Procedure: CATARACT EXTRACTION PHACO AND INTRAOCULAR LENS PLACEMENT (IOC);  Surgeon: Leandrew Koyanagi, MD;  Location: [place];  Service: Ophthalmology;  Laterality: Right;  DIABETIC - insulin and oral meds MALYUGIN SHUGARCAINE  . CHOLECYSTECTOMY     1967  . COLONOSCOPY WITH PROPOFOL N/A 05/16/2018   Procedure: COLONOSCOPY WITH PROPOFOL;  Surgeon: Lin Landsman, MD;  Location: [facility] ENDOSCOPY;  Service: Gastroenterology;  Laterality: N/A;  . DILATION  AND CURETTAGE OF UTERUS    . ESOPHAGOGASTRODUODENOSCOPY (EGD) WITH PROPOFOL N/A 05/16/2018   Procedure: ESOPHAGOGASTRODUODENOSCOPY (EGD) WITH PROPOFOL;  Surgeon: Lin Landsman, MD;  Location: [place];  Service: Gastroenterology;  Laterality: N/A;  . EYE SURGERY    . LAPAROSCOPIC TUBAL LIGATION    . RIGHT/LEFT HEART CATH AND CORONARY ANGIOGRAPHY N/A 08/02/2017   Procedure: RIGHT/LEFT HEART CATH AND CORONARY ANGIOGRAPHY;  Surgeon: Corey Skains, MD;  Location: [place] CV LAB;  Service: Cardiovascular;  Laterality: N/A;  . TEE WITHOUT CARDIOVERSION N/A 09/04/2017   Procedure: TRANSESOPHAGEAL ECHOCARDIOGRAM (TEE);  Surgeon: Sherren Mocha, MD;  Location: [place];  Service: Open  Heart Surgery;  Laterality: N/A;  . TONSILLECTOMY     1974  . TRANSCATHETER AORTIC VALVE REPLACEMENT, TRANSFEMORAL N/A 09/04/2017   Procedure: TRANSCATHETER AORTIC VALVE REPLACEMENT, TRANSFEMORAL;  Surgeon: Sherren Mocha, MD;  Location: [place];  Service: Open Heart Surgery;  Laterality: N/A;  . TUBAL LIGATION      Current Medications: Outpatient Medications Prior to Visit  Medication Sig Dispense Refill  . acetaminophen (TYLENOL) 500 MG tablet Take 1,000 mg by mouth every 6 (six) hours as needed for mild pain.    Marland Kitchen amLODipine (NORVASC) 5 MG tablet TAKE 1 TABLET (5 MG TOTAL) BY MOUTH DAILY. 90 tablet 3  . aspirin EC 81 MG tablet Take 81 mg by mouth daily.    Marland Kitchen atorvastatin (LIPITOR) 40 MG tablet TAKE 1 TABLET (40 MG TOTAL) BY MOUTH DAILY. 90 tablet 3  . BD INSULIN SYRINGE U/F 31G X 5/16" 1 ML MISC 4 (four) times daily.  99  . carvedilol (COREG) 25 MG tablet TAKE 1 TABLET BY MOUTH 2 TIMES DAILY WITH A MEAL. 180 tablet 3  . DiphenhydrAMINE HCl ([employer] ALLERGY PO) Take 1 tablet by mouth daily.    . dorzolamide-timolol (COSOPT) 22.3-6.8 MG/ML ophthalmic solution Place 1 drop into both eyes 2 (two) times daily.  5  . ferrous sulfate (SM IRON) 325 (65 FE) MG tablet Take 325 mg by mouth daily.    Marland Kitchen glucose blood test strip Use three test strips daily as instructed.    . insulin aspart (NOVOLOG) 100 UNIT/ML injection Inject 20-25 Units into the skin 2 (two) times daily before a meal. Takes 20 units before breakfast and 25 units before dinner.    . insulin detemir (LEVEMIR) 100 UNIT/ML injection Inject 20 units into the skin in the morning and 55 units at bedtime    . latanoprost (XALATAN) 0.005 % ophthalmic solution Place 1 drop into both eyes at bedtime.     Marland Kitchen levothyroxine (SYNTHROID, LEVOTHROID) 100 MCG tablet TAKE ONE TABLET BY MOUTH ONCE DAILY. TAKE EARLY MORNING ON AN EMPTY STOMACH 90 tablet 3  . losartan (COZAAR) 100 MG tablet TAKE 1 TABLET BY MOUTH EVERY DAY 90 tablet 3  . metFORMIN  (GLUCOPHAGE) 1000 MG tablet Take 2,000 mg by mouth daily after supper.    . pantoprazole (PROTONIX) 40 MG tablet Take 1 tablet (40 mg total) by mouth daily at 12 noon. 90 tablet 3  . promethazine-dextromethorphan (PROMETHAZINE-DM) 6.25-15 MG/5ML syrup Take 2.5 mLs by mouth 4 (four) times daily as needed for cough. 118 mL 0  . amLODipine (NORVASC) 2.5 MG tablet Take 1 tablet (2.5 mg total) by mouth daily.     No facility-administered medications prior to visit.      Allergies:   Hydrocodone-homatropine; Azithromycin; Codeine; Erythromycin; Penicillins; and Sulfa antibiotics   Social History   Socioeconomic History  . Marital status:  Married    Spouse name: Not on file  . Number of children: 3  . Years of education: Not on file  . Highest education level: GED or equivalent  Occupational History  . Not on file  Social Needs  . Financial resource strain: Not hard at all  . Food insecurity:    Worry: Never true    Inability: Never true  . Transportation needs:    Medical: No    Non-medical: No  Tobacco Use  . Smoking status: Never Smoker  . Smokeless tobacco: Never Used  Substance and Sexual Activity  . Alcohol use: No    Alcohol/week: 0.0 standard drinks  . Drug use: Never  . Sexual activity: Not on file  Lifestyle  . Physical activity:    Days per week: Not on file    Minutes per session: Not on file  . Stress: Not at all  Relationships  . Social connections:    Talks on phone: Not on file    Gets together: Not on file    Attends religious service: Not on file    Active member of club or organization: Not on file    Attends meetings of clubs or organizations: Not on file    Relationship status: Not on file  Other Topics Concern  . Not on file  Social History Narrative  . Not on file     Family History:  The patient's family history includes Alzheimer's disease in her mother; Breast cancer in her sister; Colon polyps in her mother and sister; Coronary artery disease  in her father; Diabetes in her father, sister, sister, sister, and sister; Heart attack in her father; Hypertension in her mother.      ROS:   Please see the history of present illness.    ROS All other systems reviewed and are negative.   PHYSICAL EXAM:   VS:  BP (!) 142/68   Pulse 61   Ht 5\' 6"  (1.676 m)   Wt 197 lb (89.4 kg)   SpO2 97%   BMI 31.80 kg/m    GEN: Well nourished, well developed, in no acute distress HEENT: normal Neck: no JVD or masses Cardiac: RRR; 2/6 SEM. No rubs, or gallops.  1+ left lower extremity edema, trace R LE edema Respiratory:  clear to auscultation bilaterally, normal work of breathing GI: soft, nontender, nondistended, + BS MS: no deformity or atrophy Skin: warm and dry, no rash Neuro:  Alert and Oriented x 3, Strength and sensation are intact Psych: euthymic mood, full affect   Wt Readings from Last 3 Encounters:  09/19/18 197 lb (89.4 kg)  07/18/18 196 lb (88.9 kg)  07/18/18 196 lb 3.2 oz (89 kg)      Studies/Labs Reviewed:   EKG:  EKG is NOT ordered today.    Recent Labs: 11/19/2017: TSH 3.38 12/25/2017: ALT 9; BUN 25; Creatinine, Ser 1.43; Potassium 4.9; Sodium 140 06/21/2018: Hemoglobin 11.4; Platelets 200   Lipid Panel    Component Value Date/Time   CHOL 160 09/03/2018 0951   TRIG 195 (H) 09/03/2018 0951   HDL 40 09/03/2018 0951   CHOLHDL 3.1 11/19/2017 0952   LDLCALC 81 09/03/2018 0951   LDLCALC 82 11/19/2017 0952    Additional studies/ records that were reviewed today include:  TAVR OPERATIVE NOTE   Date of Procedure:09/04/2017  Preoperative Diagnosis:Severe Aortic Stenosis  Procedure:   Transcatheter Aortic Valve Replacement - Percutaneous RightTransfemoral Approach Medtronic CoreValve Evolut Pro (size 4mm, serial # [id number])  Pre-operative Echo Findings: ? Severe aortic stenosis ? Normalleft ventricular systolic function  Post-operative Echo  Findings: ? Noparavalvular leak ? Normalleft ventricular systolic function  ________________________   2D ECHO 09/27/17 (1 month status post TAVR) Study Conclusions - Left ventricle: The cavity size was normal. Wall thickness was increased in a pattern of mild LVH. Systolic function was normal. The estimated ejection fraction was in the range of 60% to 65%. The study is not technically sufficient to allow evaluation of LV diastolic function. - Aortic valve: Post TAVR with 26 mm Evolut Pro valve. No significant peri valvular regurgitation. Stable mean gradient 9 mmHg to 11 mmHg since 09/05/17. - Mitral valve: Severely calcified annulus. Mildly thickened leaflets . - Atrial septum: No defect or patent foramen ovale was identified  ________________________  Echo 09/19/2018 Study Conclusions - Left ventricle: The cavity size was normal. There was moderate   concentric hypertrophy. Systolic function was normal. The   estimated ejection fraction was in the range of 60% to 65%. Wall   motion was normal; there were no regional wall motion   abnormalities. Features are consistent with a pseudonormal left   ventricular filling pattern, with concomitant abnormal relaxation   and increased filling pressure (grade 2 diastolic dysfunction). - Aortic valve: A stent-valve (TAVR) bioprosthesis was present and   functioning normally. - Mitral valve: Severely calcified annulus. Mildly thickened   leaflets . The findings are consistent with mild stenosis.   Gradients at 63 bpm. Mean gradient (D): 4 mm Hg. - Left atrium: The atrium was mildly dilated. Impressions: - TAVR gradients are unchanged since October 2018.  ASSESSMENT & PLAN:   Severe AS s/p TAVR:  Doing well.  Echo today showed EF 60%, normally functioning TAVR with mean gradient of 11 mm Hg and no PVL. She has NYHA class I symptoms.  She has clindamycin for SBE prophylaxis.  She will continue on aspirin and  follow-up with Dr. Adolph Pollack as regularly scheduled.  HTN:  BP with moderate control today.  No changes made.  HLD:  Continue statin.  Iron deficiency anemia:  This is being followed closely.  She was recently taken off of iron she has a hemoglobin check tomorrow.   Medication Adjustments/Labs and Tests Ordered: Current medicines are reviewed at length with the patient today.  Concerns regarding medicines are outlined above.  Medication changes, Labs and Tests ordered today are listed in the Patient Instructions below. Patient Instructions  Medication Instructions:  Your provider recommends that you continue on your current medications as directed. Please refer to the Current Medication list given to you today.    Labwork: None  Testing/Procedures: None  Follow-Up: Please keep follow-up with Dr. Nehemiah Massed.  Your provider recommends that you schedule a follow-up appointment AS NEEDED with the Structural Heart Team!    Signed, Angelena Form, PA-C  09/20/2018 9:45 AM    [address], [place], [place]  [address] Phone: [phone]; Fax: [phone]

## 2018-09-20 ENCOUNTER — Inpatient Hospital Stay: Payer: Medicare HMO | Attending: Oncology

## 2018-09-20 ENCOUNTER — Other Ambulatory Visit: Payer: Self-pay

## 2018-09-20 DIAGNOSIS — D509 Iron deficiency anemia, unspecified: Secondary | ICD-10-CM | POA: Diagnosis not present

## 2018-09-20 LAB — CBC WITH DIFFERENTIAL/PLATELET
Abs Immature Granulocytes: 0.02 10*3/uL (ref 0.00–0.07)
Basophils Absolute: 0 10*3/uL (ref 0.0–0.1)
Basophils Relative: 1 %
Eosinophils Absolute: 0.3 10*3/uL (ref 0.0–0.5)
Eosinophils Relative: 4 %
HEMATOCRIT: 32.4 % — AB (ref 36.0–46.0)
Hemoglobin: 10.7 g/dL — ABNORMAL LOW (ref 12.0–15.0)
IMMATURE GRANULOCYTES: 0 %
LYMPHS ABS: 1.5 10*3/uL (ref 0.7–4.0)
Lymphocytes Relative: 22 %
MCH: 29.6 pg (ref 26.0–34.0)
MCHC: 33 g/dL (ref 30.0–36.0)
MCV: 89.8 fL (ref 80.0–100.0)
MONO ABS: 0.8 10*3/uL (ref 0.1–1.0)
MONOS PCT: 11 %
Neutro Abs: 4.1 10*3/uL (ref 1.7–7.7)
Neutrophils Relative %: 62 %
Platelets: 202 10*3/uL (ref 150–400)
RBC: 3.61 MIL/uL — ABNORMAL LOW (ref 3.87–5.11)
RDW: 12.6 % (ref 11.5–15.5)
WBC: 6.7 10*3/uL (ref 4.0–10.5)
nRBC: 0 % (ref 0.0–0.2)

## 2018-09-20 LAB — IRON AND TIBC
IRON: 76 ug/dL (ref 28–170)
SATURATION RATIOS: 23 % (ref 10.4–31.8)
TIBC: 332 ug/dL (ref 250–450)
UIBC: 256 ug/dL

## 2018-09-20 LAB — FERRITIN: Ferritin: 125 ng/mL (ref 11–307)

## 2018-09-24 DIAGNOSIS — R69 Illness, unspecified: Secondary | ICD-10-CM | POA: Diagnosis not present

## 2018-09-25 ENCOUNTER — Ambulatory Visit (INDEPENDENT_AMBULATORY_CARE_PROVIDER_SITE_OTHER): Payer: Medicare HMO

## 2018-09-25 DIAGNOSIS — Z23 Encounter for immunization: Secondary | ICD-10-CM

## 2018-09-26 DIAGNOSIS — H01003 Unspecified blepharitis right eye, unspecified eyelid: Secondary | ICD-10-CM | POA: Diagnosis not present

## 2018-09-27 ENCOUNTER — Encounter: Payer: Self-pay | Admitting: Thoracic Surgery (Cardiothoracic Vascular Surgery)

## 2018-10-02 DIAGNOSIS — R69 Illness, unspecified: Secondary | ICD-10-CM | POA: Diagnosis not present

## 2018-10-24 ENCOUNTER — Ambulatory Visit: Payer: Self-pay | Admitting: Family Medicine

## 2018-11-17 ENCOUNTER — Other Ambulatory Visit (HOSPITAL_COMMUNITY): Payer: Self-pay | Admitting: Physician Assistant

## 2018-11-17 DIAGNOSIS — I10 Essential (primary) hypertension: Secondary | ICD-10-CM

## 2018-11-19 ENCOUNTER — Encounter: Payer: Self-pay | Admitting: Family Medicine

## 2018-11-19 ENCOUNTER — Ambulatory Visit (INDEPENDENT_AMBULATORY_CARE_PROVIDER_SITE_OTHER): Payer: Medicare HMO | Admitting: Family Medicine

## 2018-11-19 VITALS — BP 128/70 | HR 58 | Temp 98.0°F | Resp 16 | Ht 65.5 in | Wt 197.0 lb

## 2018-11-19 DIAGNOSIS — G8929 Other chronic pain: Secondary | ICD-10-CM | POA: Diagnosis not present

## 2018-11-19 DIAGNOSIS — M25551 Pain in right hip: Secondary | ICD-10-CM

## 2018-11-19 DIAGNOSIS — N183 Chronic kidney disease, stage 3 unspecified: Secondary | ICD-10-CM

## 2018-11-19 DIAGNOSIS — E1121 Type 2 diabetes mellitus with diabetic nephropathy: Secondary | ICD-10-CM | POA: Diagnosis not present

## 2018-11-19 DIAGNOSIS — M545 Low back pain, unspecified: Secondary | ICD-10-CM

## 2018-11-19 MED ORDER — METHYLPREDNISOLONE ACETATE 40 MG/ML IJ SUSP
40.0000 mg | Freq: Once | INTRAMUSCULAR | Status: AC
  Administered 2018-11-19: 40 mg via INTRAMUSCULAR

## 2018-11-19 NOTE — Progress Notes (Signed)
Patient: Stacey Pierce Female    DOB: [birth year]   77 y.o.   MRN: [id number] Visit Date: 11/19/2018  Today's Provider: Wilhemena Durie, MD   Chief Complaint  Patient presents with  . Hip Pain   Subjective:     HPI  Patient comes in today c/o pain in her right hip X 3 days. She reports that she has had issues with her hip in the past. She feels that it could be sciatica. She has occasional numbness and tingling that radiates down her right leg. She has taken Tylenol with minimal relief.   Allergies  Allergen Reactions  . Hydrocodone-Homatropine     Nausea/dizziness  . Azithromycin Rash    Other reaction(s): Unknown MYCIN GROUP   . Codeine Other (See Comments)    "COLIC"  . Erythromycin Rash  . Penicillins Rash    Has patient had a PCN reaction causing immediate rash, facial/tongue/throat swelling, SOB or lightheadedness with hypotension: No Has patient had a PCN reaction causing severe rash involving mucus membranes or skin necrosis: No Has patient had a PCN reaction that required hospitalization: No Has patient had a PCN reaction occurring within the last 10 years: No If all of the above answers are "NO", then may proceed with Cephalosporin use.   . Sulfa Antibiotics Other (See Comments)    "COLIC"     Current Outpatient Medications:  .  acetaminophen (TYLENOL) 500 MG tablet, Take 1,000 mg by mouth every 6 (six) hours as needed for mild pain., Disp: , Rfl:  .  amLODipine (NORVASC) 5 MG tablet, TAKE 1 TABLET (5 MG TOTAL) BY MOUTH DAILY., Disp: 90 tablet, Rfl: 3 .  aspirin EC 81 MG tablet, Take 81 mg by mouth daily., Disp: , Rfl:  .  atorvastatin (LIPITOR) 40 MG tablet, TAKE 1 TABLET (40 MG TOTAL) BY MOUTH DAILY., Disp: 90 tablet, Rfl: 3 .  BD INSULIN SYRINGE U/F 31G X 5/16" 1 ML MISC, 4 (four) times daily., Disp: , Rfl: 99 .  carvedilol (COREG) 25 MG tablet, TAKE 1 TABLET BY MOUTH 2 TIMES DAILY WITH A MEAL., Disp: 180 tablet, Rfl: 3 .  clindamycin (CLEOCIN)  300 MG capsule, Take 600mg  (2 tablets) one hour prior to dental visits., Disp: 4 capsule, Rfl: 6 .  DiphenhydrAMINE HCl ([employer] ALLERGY PO), Take 1 tablet by mouth daily., Disp: , Rfl:  .  dorzolamide-timolol (COSOPT) 22.3-6.8 MG/ML ophthalmic solution, Place 1 drop into both eyes 2 (two) times daily., Disp: , Rfl: 5 .  ferrous sulfate (SM IRON) 325 (65 FE) MG tablet, Take 325 mg by mouth daily., Disp: , Rfl:  .  glucose blood test strip, Use three test strips daily as instructed., Disp: , Rfl:  .  insulin aspart (NOVOLOG) 100 UNIT/ML injection, Inject 20-25 Units into the skin 2 (two) times daily before a meal. Takes 20 units before breakfast and 25 units before dinner., Disp: , Rfl:  .  insulin detemir (LEVEMIR) 100 UNIT/ML injection, Inject 20 units into the skin in the morning and 55 units at bedtime, Disp: , Rfl:  .  latanoprost (XALATAN) 0.005 % ophthalmic solution, Place 1 drop into both eyes at bedtime. , Disp: , Rfl:  .  levothyroxine (SYNTHROID, LEVOTHROID) 100 MCG tablet, TAKE ONE TABLET BY MOUTH ONCE DAILY. TAKE EARLY MORNING ON AN EMPTY STOMACH, Disp: 90 tablet, Rfl: 3 .  losartan (COZAAR) 100 MG tablet, TAKE 1 TABLET BY MOUTH EVERY DAY, Disp: 90 tablet, Rfl: 3 .  metFORMIN (GLUCOPHAGE) 1000 MG tablet, Take 2,000 mg by mouth daily after supper., Disp: , Rfl:  .  pantoprazole (PROTONIX) 40 MG tablet, TAKE 1 TABLET (40 MG TOTAL) BY MOUTH DAILY AT 12 NOON., Disp: 90 tablet, Rfl: 3 .  promethazine-dextromethorphan (PROMETHAZINE-DM) 6.25-15 MG/5ML syrup, Take 2.5 mLs by mouth 4 (four) times daily as needed for cough., Disp: 118 mL, Rfl: 0  Review of Systems  Constitutional: Positive for activity change.  HENT: Negative.   Eyes: Negative.   Respiratory: Negative.   Cardiovascular: Negative for chest pain, palpitations and leg swelling.  Gastrointestinal: Negative.   Endocrine: Negative.   Musculoskeletal: Positive for arthralgias, back pain and myalgias. Negative for gait problem, joint  swelling, neck pain and neck stiffness.  Allergic/Immunologic: Negative.   Neurological: Negative for dizziness, light-headedness and headaches.  Psychiatric/Behavioral: Negative.     Social History   Tobacco Use  . Smoking status: Never Smoker  . Smokeless tobacco: Never Used  Substance Use Topics  . Alcohol use: No    Alcohol/week: 0.0 standard drinks      Objective:   BP 128/70 (BP Location: Left Arm, Patient Position: Sitting, Cuff Size: Normal)   Pulse (!) 58   Temp 98 F (36.7 C)   Resp 16   Ht 5' 5.5" (1.664 m)   Wt 197 lb (89.4 kg)   SpO2 98%   BMI 32.28 kg/m  Vitals:   11/19/18 1330  BP: 128/70  Pulse: (!) 58  Resp: 16  Temp: 98 F (36.7 C)  SpO2: 98%  Weight: 197 lb (89.4 kg)  Height: 5' 5.5" (1.664 m)     Physical Exam Constitutional:      Appearance: She is well-developed.  HENT:     Head: Normocephalic and atraumatic.     Right Ear: External ear normal.     Left Ear: External ear normal.  Eyes:     General: No scleral icterus.    Conjunctiva/sclera: Conjunctivae normal.  Neck:     Thyroid: No thyromegaly.  Cardiovascular:     Rate and Rhythm: Normal rate and regular rhythm.     Heart sounds: Normal heart sounds.  Pulmonary:     Effort: Pulmonary effort is normal.     Breath sounds: Normal breath sounds.  Abdominal:     Palpations: Abdomen is soft.  Musculoskeletal:        General: No tenderness or deformity.     Comments: Her discomfort she describes as hip pain is pain in the region of the right SI joint.  Skin:    General: Skin is warm and dry.     Capillary Refill: Capillary refill takes more than 3 seconds.  Neurological:     Mental Status: She is alert and oriented to person, place, and time.  Psychiatric:        Behavior: Behavior normal.        Thought Content: Thought content normal.        Judgment: Judgment normal.         Assessment & Plan     1. Pain of right hip joint  - methylPREDNISolone acetate  (DEPO-MEDROL) injection 40 mg - methylPREDNISolone acetate (DEPO-MEDROL) injection 40 mg  2. Chronic right-sided low back pain without sciatica   3. Chronic kidney disease (CKD), stage III (moderate) (HCC)   4. Type 2 diabetes mellitus with diabetic nephropathy, unspecified whether long term insulin use ([place]) Patient instructed that blood sugars will be higher due to prednisone.    I have  done the exam and reviewed the above chart and it is accurate to the best of my knowledge. Development worker, community has been used in this note in any air is in the dictation or transcription are unintentional.  Wilhemena Durie, MD  [place]

## 2018-11-20 ENCOUNTER — Telehealth: Payer: Self-pay

## 2018-11-20 DIAGNOSIS — M25551 Pain in right hip: Secondary | ICD-10-CM

## 2018-11-20 NOTE — Telephone Encounter (Signed)
Patient ha called office stating that she was seen in office yesterday and was given a shot in her hip for pain. Patient states that she is not feeling any better today and wants to know if something can be called in for pain? Patient states that she has been taking Tylenol every 3-4hrs. KW

## 2018-11-20 NOTE — Telephone Encounter (Signed)
Please advise 

## 2018-11-25 NOTE — Telephone Encounter (Signed)
Topical and heat==may need referral.

## 2018-11-25 NOTE — Telephone Encounter (Signed)
Yes

## 2018-11-25 NOTE — Telephone Encounter (Signed)
LMTCB

## 2018-11-25 NOTE — Telephone Encounter (Signed)
LMTCB ED 

## 2018-11-25 NOTE — Telephone Encounter (Signed)
She said she has been using heat.  She would like to go ahead and do referral now.  Would you like Ortho

## 2018-11-29 DIAGNOSIS — Z794 Long term (current) use of insulin: Secondary | ICD-10-CM | POA: Diagnosis not present

## 2018-11-29 DIAGNOSIS — E11319 Type 2 diabetes mellitus with unspecified diabetic retinopathy without macular edema: Secondary | ICD-10-CM | POA: Diagnosis not present

## 2018-12-06 DIAGNOSIS — Z794 Long term (current) use of insulin: Secondary | ICD-10-CM | POA: Diagnosis not present

## 2018-12-06 DIAGNOSIS — N183 Chronic kidney disease, stage 3 (moderate): Secondary | ICD-10-CM | POA: Diagnosis not present

## 2018-12-06 DIAGNOSIS — R809 Proteinuria, unspecified: Secondary | ICD-10-CM | POA: Diagnosis not present

## 2018-12-06 DIAGNOSIS — E1129 Type 2 diabetes mellitus with other diabetic kidney complication: Secondary | ICD-10-CM | POA: Diagnosis not present

## 2018-12-06 DIAGNOSIS — E1122 Type 2 diabetes mellitus with diabetic chronic kidney disease: Secondary | ICD-10-CM | POA: Diagnosis not present

## 2018-12-06 DIAGNOSIS — E11319 Type 2 diabetes mellitus with unspecified diabetic retinopathy without macular edema: Secondary | ICD-10-CM | POA: Diagnosis not present

## 2018-12-06 DIAGNOSIS — E1142 Type 2 diabetes mellitus with diabetic polyneuropathy: Secondary | ICD-10-CM | POA: Diagnosis not present

## 2018-12-20 ENCOUNTER — Other Ambulatory Visit: Payer: Medicare HMO

## 2018-12-20 ENCOUNTER — Ambulatory Visit: Payer: Medicare HMO | Admitting: Oncology

## 2018-12-23 DIAGNOSIS — R69 Illness, unspecified: Secondary | ICD-10-CM | POA: Diagnosis not present

## 2018-12-27 ENCOUNTER — Other Ambulatory Visit: Payer: Self-pay

## 2018-12-27 ENCOUNTER — Inpatient Hospital Stay: Payer: Medicare HMO | Attending: Oncology

## 2018-12-27 ENCOUNTER — Inpatient Hospital Stay (HOSPITAL_BASED_OUTPATIENT_CLINIC_OR_DEPARTMENT_OTHER): Payer: Medicare HMO | Admitting: Oncology

## 2018-12-27 VITALS — BP 151/60 | HR 62 | Temp 97.9°F | Ht 65.5 in | Wt 194.9 lb

## 2018-12-27 DIAGNOSIS — Z794 Long term (current) use of insulin: Secondary | ICD-10-CM | POA: Insufficient documentation

## 2018-12-27 DIAGNOSIS — N390 Urinary tract infection, site not specified: Secondary | ICD-10-CM | POA: Diagnosis not present

## 2018-12-27 DIAGNOSIS — E785 Hyperlipidemia, unspecified: Secondary | ICD-10-CM | POA: Insufficient documentation

## 2018-12-27 DIAGNOSIS — E669 Obesity, unspecified: Secondary | ICD-10-CM | POA: Insufficient documentation

## 2018-12-27 DIAGNOSIS — E039 Hypothyroidism, unspecified: Secondary | ICD-10-CM | POA: Diagnosis not present

## 2018-12-27 DIAGNOSIS — I10 Essential (primary) hypertension: Secondary | ICD-10-CM

## 2018-12-27 DIAGNOSIS — D509 Iron deficiency anemia, unspecified: Secondary | ICD-10-CM | POA: Diagnosis not present

## 2018-12-27 DIAGNOSIS — E119 Type 2 diabetes mellitus without complications: Secondary | ICD-10-CM | POA: Diagnosis not present

## 2018-12-27 DIAGNOSIS — Z7982 Long term (current) use of aspirin: Secondary | ICD-10-CM | POA: Insufficient documentation

## 2018-12-27 DIAGNOSIS — D631 Anemia in chronic kidney disease: Secondary | ICD-10-CM | POA: Diagnosis not present

## 2018-12-27 DIAGNOSIS — E1122 Type 2 diabetes mellitus with diabetic chronic kidney disease: Secondary | ICD-10-CM | POA: Diagnosis not present

## 2018-12-27 DIAGNOSIS — N183 Chronic kidney disease, stage 3 (moderate): Secondary | ICD-10-CM | POA: Diagnosis not present

## 2018-12-27 DIAGNOSIS — Z79899 Other long term (current) drug therapy: Secondary | ICD-10-CM | POA: Diagnosis not present

## 2018-12-27 DIAGNOSIS — N2581 Secondary hyperparathyroidism of renal origin: Secondary | ICD-10-CM | POA: Diagnosis not present

## 2018-12-27 LAB — CBC WITH DIFFERENTIAL/PLATELET
Abs Immature Granulocytes: 0.02 10*3/uL (ref 0.00–0.07)
Basophils Absolute: 0.1 10*3/uL (ref 0.0–0.1)
Basophils Relative: 1 %
Eosinophils Absolute: 0.3 10*3/uL (ref 0.0–0.5)
Eosinophils Relative: 4 %
HCT: 34.8 % — ABNORMAL LOW (ref 36.0–46.0)
Hemoglobin: 11.1 g/dL — ABNORMAL LOW (ref 12.0–15.0)
Immature Granulocytes: 0 %
Lymphocytes Relative: 22 %
Lymphs Abs: 1.6 10*3/uL (ref 0.7–4.0)
MCH: 28.9 pg (ref 26.0–34.0)
MCHC: 31.9 g/dL (ref 30.0–36.0)
MCV: 90.6 fL (ref 80.0–100.0)
MONOS PCT: 10 %
Monocytes Absolute: 0.7 10*3/uL (ref 0.1–1.0)
Neutro Abs: 4.6 10*3/uL (ref 1.7–7.7)
Neutrophils Relative %: 63 %
Platelets: 207 10*3/uL (ref 150–400)
RBC: 3.84 MIL/uL — ABNORMAL LOW (ref 3.87–5.11)
RDW: 12.4 % (ref 11.5–15.5)
WBC: 7.3 10*3/uL (ref 4.0–10.5)
nRBC: 0 % (ref 0.0–0.2)

## 2018-12-27 LAB — IRON AND TIBC
Iron: 67 ug/dL (ref 28–170)
Saturation Ratios: 19 % (ref 10.4–31.8)
TIBC: 350 ug/dL (ref 250–450)
UIBC: 283 ug/dL

## 2018-12-27 LAB — FERRITIN: Ferritin: 89 ng/mL (ref 11–307)

## 2018-12-30 NOTE — Progress Notes (Signed)
Hematology/Oncology Consult note [facility]  Telephone:([phone] [phone]  Patient Care Team: Jerrol Banana., MD as PCP - General (Family Medicine) Corey Skains, MD as Consulting Physician (Internal Medicine) Rexene Alberts, MD as Consulting Physician (Cardiothoracic Surgery) Leandrew Koyanagi, MD as Referring Physician (Ophthalmology) Gabriel Carina Betsey Holiday, MD as Physician Assistant (Endocrinology) Benjaman Kindler, MD as Consulting Physician (Obstetrics and Gynecology) Lavonia Dana, MD as Consulting Physician (Internal Medicine) Sindy Guadeloupe, MD as Consulting Physician (Oncology)   Name of the patient: Stacey Pierce  [id number]  01-19-1941   Date of visit: 12/30/18  Diagnosis-iron deficiency anemia possibly due to hiatal hernia with  Chief complaint/ Reason for visit-routine follow-up of iron deficiency anemia  Heme/Onc history: patient is a 78 year old female with a past medical history significant for obesity, hypertension, hyperlipidemia, diabetes who recently underwent TAVR procedure. She has baseline chronic anemia and her hemoglobin is around 10. Patient was found to have evidence of iron deficiency based on her labs in January 2019 and she was referred to Dr. Marius Ditch for further management. She also had stool occult testing which came back positive. She has been on oral iron since February 2019. She was also taking thousand micrograms of oral B12 until recently. With regards to her TAVR procedure she was on aspirin and Plavix. Subsequently her aspirin was stopped and she will be coming off her Plavix in the next couple of weeks following which she will be getting an EGD and colonoscopy by Dr. Marius Ditch. She has been referred to [place] for management of iron deficiency anemia.  Recent CBC on 04/08/2018 showed white count of 6.5, H&H of 10.6/32 with a platelet count of 258. Iron studies were normal. Ferritin level was low at 15.  B12 level was normal at 852. Prior folate was normal in February 2019.  Patient denies any gross blood in her urine or stool. She denies any consistent use of NSAIDs.  Patient received 2 doses of Feraheme in May 2019. She underwent EGD and colonoscopy in June 2019.  Colonoscopy revealed small polyp in the descending colon.  Nonbleeding internal hemorrhoids EGD showed normal duodenal bulb and normal stomach.  Small hiatal hernia.  Normal GE junction and esophagus.   Interval history-she is doing well and denies any bleeding in her stool or urine.  Denies any dark melanotic stools.  She is not currently taking any oral iron.  She has mild chronic fatigue but no new complaints  ECOG PS- 1 Pain scale- 0   Review of systems- Review of Systems  Constitutional: Positive for malaise/fatigue. Negative for chills, fever and weight loss.  HENT: Negative for congestion, ear discharge and nosebleeds.   Eyes: Negative for blurred vision.  Respiratory: Negative for cough, hemoptysis, sputum production, shortness of breath and wheezing.   Cardiovascular: Negative for chest pain, palpitations, orthopnea and claudication.  Gastrointestinal: Negative for abdominal pain, blood in stool, constipation, diarrhea, heartburn, melena, nausea and vomiting.  Genitourinary: Negative for dysuria, flank pain, frequency, hematuria and urgency.  Musculoskeletal: Negative for back pain, joint pain and myalgias.  Skin: Negative for rash.  Neurological: Negative for dizziness, tingling, focal weakness, seizures, weakness and headaches.  Endo/Heme/Allergies: Does not bruise/bleed easily.  Psychiatric/Behavioral: Negative for depression and suicidal ideas. The patient does not have insomnia.       Allergies  Allergen Reactions  . Hydrocodone-Homatropine     Nausea/dizziness  . Azithromycin Rash    Other reaction(s): Unknown MYCIN GROUP   . Codeine  Other (See Comments)    "COLIC"  . Erythromycin Rash  .  Penicillins Rash    Has patient had a PCN reaction causing immediate rash, facial/tongue/throat swelling, SOB or lightheadedness with hypotension: No Has patient had a PCN reaction causing severe rash involving mucus membranes or skin necrosis: No Has patient had a PCN reaction that required hospitalization: No Has patient had a PCN reaction occurring within the last 10 years: No If all of the above answers are "NO", then may proceed with Cephalosporin use.   . Sulfa Antibiotics Other (See Comments)    "COLIC"     Past Medical History:  Diagnosis Date  . Anemia   . Aortic stenosis, severe    a. 09/2017: s/p TAVR  . CKD (chronic kidney disease)   . Diabetes mellitus, insulin dependent (IDDM), uncontrolled ([place])   . DR (diabetic retinopathy) ([place])   . GERD (gastroesophageal reflux disease)   . Glaucoma   . Hyperlipidemia   . Hypertension   . Hypothyroidism   . Obesity   . S/P TAVR (transcatheter aortic valve replacement) 09/04/2017   26 mm Medtronic Evolut Pro transcatheter heart valve placed via percutaneous right transfemoral approach      Past Surgical History:  Procedure Laterality Date  . CATARACT EXTRACTION     left 2010  . CATARACT EXTRACTION W/PHACO Right 10/13/2015   Procedure: CATARACT EXTRACTION PHACO AND INTRAOCULAR LENS PLACEMENT (IOC);  Surgeon: Leandrew Koyanagi, MD;  Location: [place];  Service: Ophthalmology;  Laterality: Right;  DIABETIC - insulin and oral meds MALYUGIN SHUGARCAINE  . CHOLECYSTECTOMY     1967  . COLONOSCOPY WITH PROPOFOL N/A 05/16/2018   Procedure: COLONOSCOPY WITH PROPOFOL;  Surgeon: Lin Landsman, MD;  Location: [facility] ENDOSCOPY;  Service: Gastroenterology;  Laterality: N/A;  . DILATION AND CURETTAGE OF UTERUS    . ESOPHAGOGASTRODUODENOSCOPY (EGD) WITH PROPOFOL N/A 05/16/2018   Procedure: ESOPHAGOGASTRODUODENOSCOPY (EGD) WITH PROPOFOL;  Surgeon: Lin Landsman, MD;  Location: [place];  Service: Gastroenterology;   Laterality: N/A;  . EYE SURGERY    . LAPAROSCOPIC TUBAL LIGATION    . RIGHT/LEFT HEART CATH AND CORONARY ANGIOGRAPHY N/A 08/02/2017   Procedure: RIGHT/LEFT HEART CATH AND CORONARY ANGIOGRAPHY;  Surgeon: Corey Skains, MD;  Location: [place] [facility];  Service: Cardiovascular;  Laterality: N/A;  . TEE WITHOUT CARDIOVERSION N/A 09/04/2017   Procedure: TRANSESOPHAGEAL ECHOCARDIOGRAM (TEE);  Surgeon: Sherren Mocha, MD;  Location: [place];  Service: Open Heart Surgery;  Laterality: N/A;  . TONSILLECTOMY     1974  . TRANSCATHETER AORTIC VALVE REPLACEMENT, TRANSFEMORAL N/A 09/04/2017   Procedure: TRANSCATHETER AORTIC VALVE REPLACEMENT, TRANSFEMORAL;  Surgeon: Sherren Mocha, MD;  Location: [place];  Service: Open Heart Surgery;  Laterality: N/A;  . TUBAL LIGATION      Social History   Socioeconomic History  . Marital status: Married    Spouse name: Not on file  . Number of children: 3  . Years of education: Not on file  . Highest education level: GED or equivalent  Occupational History  . Not on file  Social Needs  . Financial resource strain: Not hard at all  . Food insecurity:    Worry: Never true    Inability: Never true  . Transportation needs:    Medical: No    Non-medical: No  Tobacco Use  . Smoking status: Never Smoker  . Smokeless tobacco: Never Used  Substance and Sexual Activity  . Alcohol use: No    Alcohol/week: 0.0 standard drinks  .  Drug use: Never  . Sexual activity: Not on file  Lifestyle  . Physical activity:    Days per week: Not on file    Minutes per session: Not on file  . Stress: Not at all  Relationships  . Social connections:    Talks on phone: Not on file    Gets together: Not on file    Attends religious service: Not on file    Active member of club or organization: Not on file    Attends meetings of clubs or organizations: Not on file    Relationship status: Not on file  . Intimate partner violence:    Fear of current or ex partner: Not on  file    Emotionally abused: Not on file    Physically abused: Not on file    Forced sexual activity: Not on file  Other Topics Concern  . Not on file  Social History Narrative  . Not on file    Family History  Problem Relation Age of Onset  . Alzheimer's disease Mother   . Colon polyps Mother   . Hypertension Mother   . Heart attack Father   . Coronary artery disease Father   . Diabetes Father   . Breast cancer Sister   . Colon polyps Sister   . Diabetes Sister   . Diabetes Sister   . Diabetes Sister   . Diabetes Sister      Current Outpatient Medications:  .  acetaminophen (TYLENOL) 500 MG tablet, Take 1,000 mg by mouth every 6 (six) hours as needed for mild pain., Disp: , Rfl:  .  amLODipine (NORVASC) 5 MG tablet, TAKE 1 TABLET (5 MG TOTAL) BY MOUTH DAILY., Disp: 90 tablet, Rfl: 3 .  aspirin EC 81 MG tablet, Take 81 mg by mouth daily., Disp: , Rfl:  .  atorvastatin (LIPITOR) 40 MG tablet, TAKE 1 TABLET (40 MG TOTAL) BY MOUTH DAILY., Disp: 90 tablet, Rfl: 3 .  BD INSULIN SYRINGE U/F 31G X 5/16" 1 ML MISC, 4 (four) times daily., Disp: , Rfl: 99 .  carvedilol (COREG) 25 MG tablet, TAKE 1 TABLET BY MOUTH 2 TIMES DAILY WITH A MEAL., Disp: 180 tablet, Rfl: 3 .  clindamycin (CLEOCIN) 300 MG capsule, Take 600mg  (2 tablets) one hour prior to dental visits., Disp: 4 capsule, Rfl: 6 .  DiphenhydrAMINE HCl ([employer] ALLERGY PO), Take 1 tablet by mouth daily., Disp: , Rfl:  .  dorzolamide-timolol (COSOPT) 22.3-6.8 MG/ML ophthalmic solution, Place 1 drop into both eyes 2 (two) times daily., Disp: , Rfl: 5 .  ferrous sulfate (SM IRON) 325 (65 FE) MG tablet, Take 325 mg by mouth daily., Disp: , Rfl:  .  gabapentin (NEURONTIN) 300 MG capsule, Take 1 capsule by mouth., Disp: , Rfl:  .  glucose blood test strip, Use three test strips daily as instructed., Disp: , Rfl:  .  insulin aspart (NOVOLOG) 100 UNIT/ML injection, Inject 20-25 Units into the skin 2 (two) times daily before a meal. Takes 20  units before breakfast and 25 units before dinner., Disp: , Rfl:  .  insulin detemir (LEVEMIR) 100 UNIT/ML injection, Inject 20 units into the skin in the morning and 55 units at bedtime, Disp: , Rfl:  .  latanoprost (XALATAN) 0.005 % ophthalmic solution, Place 1 drop into both eyes at bedtime. , Disp: , Rfl:  .  levothyroxine (SYNTHROID, LEVOTHROID) 100 MCG tablet, TAKE ONE TABLET BY MOUTH ONCE DAILY. TAKE EARLY MORNING ON AN EMPTY STOMACH, Disp: 90  tablet, Rfl: 3 .  losartan (COZAAR) 100 MG tablet, TAKE 1 TABLET BY MOUTH EVERY DAY, Disp: 90 tablet, Rfl: 3 .  metFORMIN (GLUCOPHAGE) 1000 MG tablet, Take 2,000 mg by mouth daily after supper., Disp: , Rfl:  .  pantoprazole (PROTONIX) 40 MG tablet, TAKE 1 TABLET (40 MG TOTAL) BY MOUTH DAILY AT 12 NOON., Disp: 90 tablet, Rfl: 3 .  promethazine-dextromethorphan (PROMETHAZINE-DM) 6.25-15 MG/5ML syrup, Take 2.5 mLs by mouth 4 (four) times daily as needed for cough., Disp: 118 mL, Rfl: 0  Physical exam:  Vitals:   12/27/18 1320 12/27/18 1321  BP: (!) 151/60   Pulse: 62   Temp: 97.9 F (36.6 C)   TempSrc: Tympanic   Weight: 194 lb 14.4 oz (88.4 kg)   Height: 5' 3.5" (1.613 m) 5' 5.5" (1.664 m)   Physical Exam Constitutional:      General: She is not in acute distress. HENT:     Head: Normocephalic and atraumatic.  Eyes:     Pupils: Pupils are equal, round, and reactive to light.  Neck:     Musculoskeletal: Normal range of motion.  Cardiovascular:     Rate and Rhythm: Normal rate and regular rhythm.     Heart sounds: Normal heart sounds.  Pulmonary:     Effort: Pulmonary effort is normal.     Breath sounds: Normal breath sounds.  Abdominal:     General: Bowel sounds are normal.     Palpations: Abdomen is soft.  Skin:    General: Skin is warm and dry.  Neurological:     Mental Status: She is alert and oriented to person, place, and time.      CMP Latest Ref Rng & Units 12/25/2017  Glucose 65 - 99 mg/dL 158(H)  BUN 8 - 27 mg/dL 25    Creatinine 0.57 - 1.00 mg/dL 1.43(H)  Sodium 134 - 144 mmol/L 140  Potassium 3.5 - 5.2 mmol/L 4.9  Chloride 96 - 106 mmol/L 104  CO2 20 - 29 mmol/L 18(L)  Calcium 8.7 - 10.3 mg/dL 9.2  Total Protein 6.0 - 8.5 g/dL 6.9  Total Bilirubin 0.0 - 1.2 mg/dL <0.2  Alkaline Phos 39 - 117 IU/L 110  AST 0 - 40 IU/L 10  ALT 0 - 32 IU/L 9   CBC Latest Ref Rng & Units 12/27/2018  WBC 4.0 - 10.5 K/uL 7.3  Hemoglobin 12.0 - 15.0 g/dL 11.1(L)  Hematocrit 36.0 - 46.0 % 34.8(L)  Platelets 150 - 400 K/uL 207      Assessment and plan- Patient is a 78 y.o. female with iron deficiency anemia possibly secondary to hiatal hernia  Patient's hemoglobin is stable at her baseline of around 11.  She is she does have this mild anemia likely secondary to chronic disease.  Her iron studies today are normal and she does not require any IV iron at this time.  Repeat CBC ferritin and iron studies in 3 in 6 months and I will see her back in 6 months   Visit Diagnosis 1. Iron deficiency anemia, unspecified iron deficiency anemia type      Dr. Randa Evens, MD, MPH [facility] [phone] 12/30/2018 12:54 PM

## 2019-01-09 ENCOUNTER — Other Ambulatory Visit: Payer: Self-pay | Admitting: Family Medicine

## 2019-01-10 DIAGNOSIS — Z794 Long term (current) use of insulin: Secondary | ICD-10-CM | POA: Diagnosis not present

## 2019-01-10 DIAGNOSIS — I350 Nonrheumatic aortic (valve) stenosis: Secondary | ICD-10-CM | POA: Diagnosis not present

## 2019-01-10 DIAGNOSIS — E1122 Type 2 diabetes mellitus with diabetic chronic kidney disease: Secondary | ICD-10-CM | POA: Diagnosis not present

## 2019-01-10 DIAGNOSIS — N183 Chronic kidney disease, stage 3 (moderate): Secondary | ICD-10-CM | POA: Diagnosis not present

## 2019-01-10 DIAGNOSIS — I2510 Atherosclerotic heart disease of native coronary artery without angina pectoris: Secondary | ICD-10-CM | POA: Diagnosis not present

## 2019-01-10 DIAGNOSIS — I10 Essential (primary) hypertension: Secondary | ICD-10-CM | POA: Diagnosis not present

## 2019-01-13 DIAGNOSIS — E113293 Type 2 diabetes mellitus with mild nonproliferative diabetic retinopathy without macular edema, bilateral: Secondary | ICD-10-CM | POA: Diagnosis not present

## 2019-01-13 LAB — HM DIABETES EYE EXAM

## 2019-01-23 ENCOUNTER — Ambulatory Visit: Payer: Self-pay | Admitting: Family Medicine

## 2019-01-30 ENCOUNTER — Encounter: Payer: Self-pay | Admitting: Family Medicine

## 2019-01-30 ENCOUNTER — Ambulatory Visit (INDEPENDENT_AMBULATORY_CARE_PROVIDER_SITE_OTHER): Payer: Medicare HMO | Admitting: Family Medicine

## 2019-01-30 VITALS — BP 136/56 | HR 58 | Temp 98.0°F | Resp 16 | Ht 66.0 in | Wt 196.0 lb

## 2019-01-30 DIAGNOSIS — I10 Essential (primary) hypertension: Secondary | ICD-10-CM | POA: Diagnosis not present

## 2019-01-30 DIAGNOSIS — I2510 Atherosclerotic heart disease of native coronary artery without angina pectoris: Secondary | ICD-10-CM | POA: Diagnosis not present

## 2019-01-30 DIAGNOSIS — E6609 Other obesity due to excess calories: Secondary | ICD-10-CM

## 2019-01-30 DIAGNOSIS — M79622 Pain in left upper arm: Secondary | ICD-10-CM | POA: Diagnosis not present

## 2019-01-30 DIAGNOSIS — E1121 Type 2 diabetes mellitus with diabetic nephropathy: Secondary | ICD-10-CM

## 2019-01-30 DIAGNOSIS — E039 Hypothyroidism, unspecified: Secondary | ICD-10-CM | POA: Diagnosis not present

## 2019-01-30 DIAGNOSIS — Z6832 Body mass index (BMI) 32.0-32.9, adult: Secondary | ICD-10-CM | POA: Diagnosis not present

## 2019-01-30 MED ORDER — MELOXICAM 7.5 MG PO TABS: 7.5000 mg | ORAL_TABLET | Freq: Every day | ORAL | 0 refills | Status: DC

## 2019-01-30 NOTE — Progress Notes (Signed)
Patient: Stacey Pierce Female    DOB: [birth year]   78 y.o.   MRN: [id number] Visit Date: 01/30/2019  Today's Provider: Wilhemena Durie, MD   Chief Complaint  Patient presents with  . Hypertension  . Hypothyroidism   Subjective:     HPI   Hypertension, follow-up:  BP Readings from Last 3 Encounters:  01/30/19 (!) 136/56  12/27/18 (!) 151/60  11/19/18 128/70    She was last seen for hypertension 6 months ago.  BP at that visit was 142/82. Management since that visit includes no changes. She reports good compliance with treatment. She is not having side effects.  She is not exercising. She is adherent to low salt diet.   Outside blood pressures are checked occasionally. She is experiencing none.  Patient denies exertional chest pressure/discomfort, lower extremity edema and palpitations.   Cardiovascular risk factors include diabetes mellitus and dyslipidemia.   Weight trend: stable Wt Readings from Last 3 Encounters:  01/30/19 196 lb (88.9 kg)  12/27/18 194 lb 14.4 oz (88.4 kg)  11/19/18 197 lb (89.4 kg)   Hypothyroidism, follow up: Patient was last seen for this 6 months ago. No medications were changed at that visit. Patient is currently taking levothyroxine 146mcg daily, and reports good compliance and good symptom control.  Lab Results  Component Value Date   TSH 3.38 11/19/2017   Diabetes is still being F/B Dr. Gabriel Carina. Patient reports that she was seen there last month and HgbA1c was 7.0  Allergies  Allergen Reactions  . Hydrocodone-Homatropine     Nausea/dizziness  . Azithromycin Rash    Other reaction(s): Unknown MYCIN GROUP   . Codeine Other (See Comments)    "COLIC"  . Erythromycin Rash  . Penicillins Rash    Has patient had a PCN reaction causing immediate rash, facial/tongue/throat swelling, SOB or lightheadedness with hypotension: No Has patient had a PCN reaction causing severe rash involving mucus membranes or skin necrosis: No Has  patient had a PCN reaction that required hospitalization: No Has patient had a PCN reaction occurring within the last 10 years: No If all of the above answers are "NO", then may proceed with Cephalosporin use.   . Sulfa Antibiotics Other (See Comments)    "COLIC"     Current Outpatient Medications:  .  acetaminophen (TYLENOL) 500 MG tablet, Take 1,000 mg by mouth every 6 (six) hours as needed for mild pain., Disp: , Rfl:  .  amLODipine (NORVASC) 5 MG tablet, TAKE 1 TABLET (5 MG TOTAL) BY MOUTH DAILY., Disp: 90 tablet, Rfl: 3 .  aspirin EC 81 MG tablet, Take 81 mg by mouth daily., Disp: , Rfl:  .  atorvastatin (LIPITOR) 40 MG tablet, TAKE 1 TABLET (40 MG TOTAL) BY MOUTH DAILY., Disp: 90 tablet, Rfl: 3 .  BD INSULIN SYRINGE U/F 31G X 5/16" 1 ML MISC, 4 (four) times daily., Disp: , Rfl: 99 .  carvedilol (COREG) 25 MG tablet, TAKE 1 TABLET BY MOUTH 2 TIMES DAILY WITH A MEAL., Disp: 180 tablet, Rfl: 3 .  DiphenhydrAMINE HCl ([employer] ALLERGY PO), Take 1 tablet by mouth daily., Disp: , Rfl:  .  dorzolamide-timolol (COSOPT) 22.3-6.8 MG/ML ophthalmic solution, Place 1 drop into both eyes 2 (two) times daily., Disp: , Rfl: 5 .  ferrous sulfate (SM IRON) 325 (65 FE) MG tablet, Take 325 mg by mouth daily., Disp: , Rfl:  .  gabapentin (NEURONTIN) 300 MG capsule, Take 1 capsule by mouth., Disp: ,  Rfl:  .  glucose blood test strip, Use three test strips daily as instructed., Disp: , Rfl:  .  insulin aspart (NOVOLOG) 100 UNIT/ML injection, Inject 20-25 Units into the skin 2 (two) times daily before a meal. Takes 20 units before breakfast and 25 units before dinner., Disp: , Rfl:  .  insulin detemir (LEVEMIR) 100 UNIT/ML injection, Inject 20 units into the skin in the morning and 55 units at bedtime, Disp: , Rfl:  .  latanoprost (XALATAN) 0.005 % ophthalmic solution, Place 1 drop into both eyes at bedtime. , Disp: , Rfl:  .  levothyroxine (SYNTHROID, LEVOTHROID) 100 MCG tablet, TAKE 1 TABLET BY MOUTH ONCE DAILY.  TAKE EARLY MORNING ON AN EMPTY STOMACH., Disp: 90 tablet, Rfl: 0 .  losartan (COZAAR) 100 MG tablet, TAKE 1 TABLET BY MOUTH EVERY DAY, Disp: 90 tablet, Rfl: 3 .  metFORMIN (GLUCOPHAGE) 1000 MG tablet, Take 2,000 mg by mouth daily after supper., Disp: , Rfl:  .  pantoprazole (PROTONIX) 40 MG tablet, TAKE 1 TABLET (40 MG TOTAL) BY MOUTH DAILY AT 12 NOON., Disp: 90 tablet, Rfl: 3 .  clindamycin (CLEOCIN) 300 MG capsule, Take 600mg  (2 tablets) one hour prior to dental visits. (Patient not taking: Reported on 01/30/2019), Disp: 4 capsule, Rfl: 6 .  promethazine-dextromethorphan (PROMETHAZINE-DM) 6.25-15 MG/5ML syrup, Take 2.5 mLs by mouth 4 (four) times daily as needed for cough. (Patient not taking: Reported on 01/30/2019), Disp: 118 mL, Rfl: 0  Review of Systems  Constitutional: Negative for activity change, appetite change, chills, diaphoresis, fatigue, fever and unexpected weight change.  HENT: Negative.   Eyes: Negative.   Respiratory: Negative for cough and shortness of breath.   Cardiovascular: Negative for chest pain, palpitations and leg swelling.  Endocrine: Negative for cold intolerance, heat intolerance, polydipsia, polyphagia and polyuria.  Skin: Positive for wound.       Left 3rd finger has a 48mm cut from a dish the patient dropped.   Allergic/Immunologic: Negative.   Neurological: Negative for dizziness, light-headedness and headaches.  Psychiatric/Behavioral: Negative.     Social History   Tobacco Use  . Smoking status: Never Smoker  . Smokeless tobacco: Never Used  Substance Use Topics  . Alcohol use: No    Alcohol/week: 0.0 standard drinks      Objective:   BP (!) 136/56 (BP Location: Right Arm, Patient Position: Sitting, Cuff Size: Normal)   Pulse (!) 58   Temp 98 F (36.7 C)   Resp 16   Ht 5\' 6"  (1.676 m)   Wt 196 lb (88.9 kg)   BMI 31.64 kg/m  Vitals:   01/30/19 1332  BP: (!) 136/56  Pulse: (!) 58  Resp: 16  Temp: 98 F (36.7 C)  Weight: 196 lb (88.9 kg)   Height: 5\' 6"  (1.676 m)     Physical Exam Constitutional:      Appearance: She is well-developed.  HENT:     Head: Normocephalic and atraumatic.     Right Ear: External ear normal.     Left Ear: External ear normal.     Nose: Nose normal.  Eyes:     Conjunctiva/sclera: Conjunctivae normal.     Pupils: Pupils are equal, round, and reactive to light.  Neck:     Musculoskeletal: Normal range of motion and neck supple.  Cardiovascular:     Rate and Rhythm: Normal rate and regular rhythm.     Heart sounds: Normal heart sounds.  Pulmonary:     Effort: Pulmonary effort is  normal.     Breath sounds: Normal breath sounds.  Abdominal:     General: Bowel sounds are normal.     Palpations: Abdomen is soft.  Musculoskeletal:        General: No swelling or tenderness.     Comments: Complains of soreness in her left axilla.  There is no mass, no tenderness, no abnormal anatomy that can be found on exam.  Skin:    General: Skin is warm and dry.  Neurological:     Mental Status: She is alert and oriented to person, place, and time. Mental status is at baseline.  Psychiatric:        Behavior: Behavior normal.        Thought Content: Thought content normal.        Judgment: Judgment normal.         Assessment & Plan    1. Benign essential HTN Controlled on losartan.  2. Left axillary pain Uncertain etiology of this discomfort.  Persist on next visit will work-up. Treat with Toradol 30 and meloxicam 7.5 mg daily.  Return to clinic in 2 to 4 weeks.  Use meloxicam only for 2 weeks. - meloxicam (MOBIC) 7.5 MG tablet; Take 1 tablet (7.5 mg total) by mouth daily.  Dispense: 30 tablet; Refill: 0  3. Arteriosclerosis of coronary artery   4. Type 2 diabetes mellitus with diabetic nephropathy, unspecified whether long term insulin use (HCC) NSAID exposure.  5. Class 1 obesity due to excess calories with serious comorbidity and body mass index (BMI) of 32.0 to 32.9 in adult With  diabetes, hypertension, arthritis. Per endocrine.  6.Hypothyroid Follow TSH.  I have done the exam and reviewed the above chart and it is accurate to the best of my knowledge. Development worker, community has been used in this note in any air is in the dictation or transcription are unintentional.  Wilhemena Durie, MD  [place]

## 2019-02-22 ENCOUNTER — Other Ambulatory Visit: Payer: Self-pay | Admitting: Family Medicine

## 2019-02-22 DIAGNOSIS — M79622 Pain in left upper arm: Secondary | ICD-10-CM

## 2019-02-27 ENCOUNTER — Ambulatory Visit: Payer: Self-pay | Admitting: Family Medicine

## 2019-03-19 DIAGNOSIS — R69 Illness, unspecified: Secondary | ICD-10-CM | POA: Diagnosis not present

## 2019-03-27 ENCOUNTER — Other Ambulatory Visit: Payer: Self-pay

## 2019-03-28 ENCOUNTER — Inpatient Hospital Stay: Payer: Medicare HMO | Attending: Oncology

## 2019-03-28 ENCOUNTER — Other Ambulatory Visit: Payer: Self-pay

## 2019-03-28 DIAGNOSIS — D509 Iron deficiency anemia, unspecified: Secondary | ICD-10-CM | POA: Diagnosis present

## 2019-03-28 LAB — CBC
HCT: 34.3 % — ABNORMAL LOW (ref 36.0–46.0)
Hemoglobin: 11.2 g/dL — ABNORMAL LOW (ref 12.0–15.0)
MCH: 29.6 pg (ref 26.0–34.0)
MCHC: 32.7 g/dL (ref 30.0–36.0)
MCV: 90.7 fL (ref 80.0–100.0)
Platelets: 213 10*3/uL (ref 150–400)
RBC: 3.78 MIL/uL — ABNORMAL LOW (ref 3.87–5.11)
RDW: 13 % (ref 11.5–15.5)
WBC: 7.4 10*3/uL (ref 4.0–10.5)
nRBC: 0 % (ref 0.0–0.2)

## 2019-03-28 LAB — VITAMIN B12: Vitamin B-12: 307 pg/mL (ref 180–914)

## 2019-03-28 LAB — IRON AND TIBC
Iron: 84 ug/dL (ref 28–170)
Saturation Ratios: 23 % (ref 10.4–31.8)
TIBC: 362 ug/dL (ref 250–450)
UIBC: 278 ug/dL

## 2019-03-28 LAB — FERRITIN: Ferritin: 126 ng/mL (ref 11–307)

## 2019-04-11 DIAGNOSIS — E1142 Type 2 diabetes mellitus with diabetic polyneuropathy: Secondary | ICD-10-CM | POA: Diagnosis not present

## 2019-04-18 DIAGNOSIS — Z794 Long term (current) use of insulin: Secondary | ICD-10-CM | POA: Diagnosis not present

## 2019-04-18 DIAGNOSIS — E11319 Type 2 diabetes mellitus with unspecified diabetic retinopathy without macular edema: Secondary | ICD-10-CM | POA: Diagnosis not present

## 2019-04-18 DIAGNOSIS — E1129 Type 2 diabetes mellitus with other diabetic kidney complication: Secondary | ICD-10-CM | POA: Diagnosis not present

## 2019-04-18 DIAGNOSIS — E1122 Type 2 diabetes mellitus with diabetic chronic kidney disease: Secondary | ICD-10-CM | POA: Diagnosis not present

## 2019-04-18 DIAGNOSIS — N183 Chronic kidney disease, stage 3 (moderate): Secondary | ICD-10-CM | POA: Diagnosis not present

## 2019-04-18 DIAGNOSIS — R809 Proteinuria, unspecified: Secondary | ICD-10-CM | POA: Diagnosis not present

## 2019-04-18 DIAGNOSIS — E1142 Type 2 diabetes mellitus with diabetic polyneuropathy: Secondary | ICD-10-CM | POA: Diagnosis not present

## 2019-04-24 ENCOUNTER — Ambulatory Visit: Payer: Self-pay | Admitting: Family Medicine

## 2019-04-30 ENCOUNTER — Other Ambulatory Visit: Payer: Self-pay | Admitting: Family Medicine

## 2019-04-30 DIAGNOSIS — I10 Essential (primary) hypertension: Secondary | ICD-10-CM

## 2019-04-30 NOTE — Telephone Encounter (Signed)
Please review

## 2019-05-08 ENCOUNTER — Ambulatory Visit (INDEPENDENT_AMBULATORY_CARE_PROVIDER_SITE_OTHER): Payer: Medicare HMO | Admitting: Family Medicine

## 2019-05-08 ENCOUNTER — Other Ambulatory Visit: Payer: Self-pay

## 2019-05-08 ENCOUNTER — Encounter: Payer: Self-pay | Admitting: Family Medicine

## 2019-05-08 VITALS — BP 100/58 | HR 60 | Temp 97.9°F | Resp 15 | Wt 198.0 lb

## 2019-05-08 DIAGNOSIS — Z6832 Body mass index (BMI) 32.0-32.9, adult: Secondary | ICD-10-CM

## 2019-05-08 DIAGNOSIS — E1121 Type 2 diabetes mellitus with diabetic nephropathy: Secondary | ICD-10-CM | POA: Diagnosis not present

## 2019-05-08 DIAGNOSIS — N183 Chronic kidney disease, stage 3 unspecified: Secondary | ICD-10-CM

## 2019-05-08 DIAGNOSIS — I10 Essential (primary) hypertension: Secondary | ICD-10-CM

## 2019-05-08 DIAGNOSIS — Z952 Presence of prosthetic heart valve: Secondary | ICD-10-CM | POA: Diagnosis not present

## 2019-05-08 DIAGNOSIS — E6609 Other obesity due to excess calories: Secondary | ICD-10-CM | POA: Diagnosis not present

## 2019-05-08 DIAGNOSIS — B354 Tinea corporis: Secondary | ICD-10-CM

## 2019-05-08 DIAGNOSIS — I350 Nonrheumatic aortic (valve) stenosis: Secondary | ICD-10-CM | POA: Diagnosis not present

## 2019-05-08 MED ORDER — CLOTRIMAZOLE 1 % EX CREA: 1.0000 "application " | TOPICAL_CREAM | Freq: Two times a day (BID) | CUTANEOUS | 0 refills | Status: DC

## 2019-05-08 NOTE — Patient Instructions (Signed)
Discontinue Amlodipine.

## 2019-05-08 NOTE — Progress Notes (Signed)
Patient: Stacey Pierce Female    DOB: 20-Aug-1941   78 y.o.   MRN: [id number] Visit Date: 05/08/2019  Today's Provider: Wilhemena Durie, MD   No chief complaint on file.  Subjective:     HPI  Hypertension, follow-up:  BP Readings from Last 3 Encounters:  05/08/19 (!) 100/58  01/30/19 (!) 136/56  12/27/18 (!) 151/60    She was last seen for hypertension 3 months ago.  BP at that visit was 136/56. Management changes since that visit include none, controlled on Losartan. She reports excellent compliance with treatment. She is not having side effects.  She is not exercising. She is not adherent to low salt diet.   Outside blood pressures are not being checked. She is experiencing none.  Patient denies chest pain, chest pressure/discomfort, dyspnea, exertional chest pressure/discomfort, fatigue, irregular heart beat, near-syncope, orthopnea, palpitations, paroxysmal nocturnal dyspnea, syncope and tachypnea.   Cardiovascular risk factors include advanced age (older than 71 for men, 60 for women), diabetes mellitus and hypertension.  Use of agents associated with hypertension: NSAIDS.     Weight trend: stable Wt Readings from Last 3 Encounters:  05/08/19 198 lb (89.8 kg)  01/30/19 196 lb (88.9 kg)  12/27/18 194 lb 14.4 oz (88.4 kg)    Current diet: well balanced  ------------------------------------------------------------------------  Hypothyroid, follow-up:  TSH  Date Value Ref Range Status  11/19/2017 3.38 0.40 - 4.50 mIU/L Final  11/07/2016 2.830 0.450 - 4.500 uIU/mL Final  07/27/2016 7.443 (H) 0.350 - 4.500 uIU/mL Final   Wt Readings from Last 3 Encounters:  05/08/19 198 lb (89.8 kg)  01/30/19 196 lb (88.9 kg)  12/27/18 194 lb 14.4 oz (88.4 kg)    She was last seen for hypothyroid 3 months ago.  Management since that visit includes none. She reports excellent compliance with treatment. She is not having side effects.  She is not exercising. She  is experiencing heat / cold intolerance She denies change in energy level, diarrhea, nervousness, palpitations and weight changes Weight trend: stable  ------------------------------------------------------------------------    Allergies  Allergen Reactions  . Hydrocodone-Homatropine     Nausea/dizziness  . Azithromycin Rash    Other reaction(s): Unknown MYCIN GROUP   . Codeine Other (See Comments)    "COLIC"  . Erythromycin Rash  . Penicillins Rash    Has patient had a PCN reaction causing immediate rash, facial/tongue/throat swelling, SOB or lightheadedness with hypotension: No Has patient had a PCN reaction causing severe rash involving mucus membranes or skin necrosis: No Has patient had a PCN reaction that required hospitalization: No Has patient had a PCN reaction occurring within the last 10 years: No If all of the above answers are "NO", then may proceed with Cephalosporin use.   . Sulfa Antibiotics Other (See Comments)    "COLIC"     Current Outpatient Medications:  .  acetaminophen (TYLENOL) 500 MG tablet, Take 1,000 mg by mouth every 6 (six) hours as needed for mild pain., Disp: , Rfl:  .  amLODipine (NORVASC) 5 MG tablet, TAKE 1 TABLET (5 MG TOTAL) BY MOUTH DAILY., Disp: 90 tablet, Rfl: 3 .  aspirin EC 81 MG tablet, Take 81 mg by mouth daily., Disp: , Rfl:  .  atorvastatin (LIPITOR) 40 MG tablet, TAKE 1 TABLET (40 MG TOTAL) BY MOUTH DAILY., Disp: 90 tablet, Rfl: 3 .  BD INSULIN SYRINGE U/F 31G X 5/16" 1 ML MISC, 4 (four) times daily., Disp: , Rfl: 99 .  carvedilol (COREG) 25 MG tablet, TAKE 1 TABLET BY MOUTH 2 TIMES DAILY WITH A MEAL., Disp: 180 tablet, Rfl: 3 .  DiphenhydrAMINE HCl ([employer] ALLERGY PO), Take 1 tablet by mouth daily., Disp: , Rfl:  .  dorzolamide-timolol (COSOPT) 22.3-6.8 MG/ML ophthalmic solution, Place 1 drop into both eyes 2 (two) times daily., Disp: , Rfl: 5 .  EUTHYROX 100 MCG tablet, TAKE 1 TABLET BY MOUTH ONCE DAILY IN THE MORNING ON AN EMPTY  STOMACH, Disp: 90 tablet, Rfl: 0 .  gabapentin (NEURONTIN) 300 MG capsule, Take 1 capsule by mouth., Disp: , Rfl:  .  glucose blood test strip, Use three test strips daily as instructed., Disp: , Rfl:  .  insulin aspart (NOVOLOG) 100 UNIT/ML injection, Inject 20-25 Units into the skin 2 (two) times daily before a meal. Takes 20 units before breakfast and 25 units before dinner., Disp: , Rfl:  .  insulin detemir (LEVEMIR) 100 UNIT/ML injection, Inject 20 units into the skin in the morning and 55 units at bedtime, Disp: , Rfl:  .  latanoprost (XALATAN) 0.005 % ophthalmic solution, Place 1 drop into both eyes at bedtime. , Disp: , Rfl:  .  losartan (COZAAR) 100 MG tablet, TAKE 1 TABLET BY MOUTH EVERY DAY, Disp: 90 tablet, Rfl: 3 .  meloxicam (MOBIC) 7.5 MG tablet, TAKE 1 TABLET BY MOUTH EVERY DAY, Disp: 30 tablet, Rfl: 0 .  metFORMIN (GLUCOPHAGE) 1000 MG tablet, Take 2,000 mg by mouth daily after supper., Disp: , Rfl:  .  pantoprazole (PROTONIX) 40 MG tablet, TAKE 1 TABLET (40 MG TOTAL) BY MOUTH DAILY AT 12 NOON., Disp: 90 tablet, Rfl: 3  Review of Systems  Constitutional: Negative.   HENT: Negative.   Eyes: Negative.   Respiratory: Negative.   Cardiovascular: Negative.   Gastrointestinal: Negative.   Endocrine: Negative.   Genitourinary: Negative.   Musculoskeletal: Positive for arthralgias and back pain.  Skin: Negative.   Allergic/Immunologic: Negative.   Neurological: Negative.   Hematological: Negative.   Psychiatric/Behavioral: Negative.   All other systems reviewed and are negative.   Social History   Tobacco Use  . Smoking status: Never Smoker  . Smokeless tobacco: Never Used  Substance Use Topics  . Alcohol use: No    Alcohol/week: 0.0 standard drinks      Objective:   BP (!) 100/58   Pulse 60   Temp 97.9 F (36.6 C) (Oral)   Resp 15   Wt 198 lb (89.8 kg)   BMI 31.96 kg/m  Vitals:   05/08/19 1449  BP: (!) 100/58  Pulse: 60  Resp: 15  Temp: 97.9 F (36.6 C)   TempSrc: Oral  Weight: 198 lb (89.8 kg)     Physical Exam Vitals signs reviewed.  Constitutional:      Appearance: She is well-developed.  HENT:     Head: Normocephalic and atraumatic.     Right Ear: External ear normal.     Left Ear: External ear normal.     Nose: Nose normal.  Eyes:     Conjunctiva/sclera: Conjunctivae normal.     Pupils: Pupils are equal, round, and reactive to light.  Neck:     Musculoskeletal: Normal range of motion and neck supple.  Cardiovascular:     Rate and Rhythm: Normal rate and regular rhythm.     Heart sounds: Murmur present.  Pulmonary:     Effort: Pulmonary effort is normal.     Breath sounds: Normal breath sounds.  Abdominal:  General: Bowel sounds are normal.     Palpations: Abdomen is soft.  Musculoskeletal:        General: No swelling or tenderness.     Comments: Complains of soreness in her left axilla.  There is no mass, no tenderness, no abnormal anatomy that can be found on exam. 2+ edema of both feet  Skin:    General: Skin is warm and dry.     Findings: Rash present.     Comments: Mild tinea corporis of lower leg.  Neurological:     Mental Status: She is alert and oriented to person, place, and time. Mental status is at baseline.  Psychiatric:        Mood and Affect: Mood normal.        Behavior: Behavior normal.        Thought Content: Thought content normal.        Judgment: Judgment normal.         Assessment & Plan       1. Benign essential HTN Very good control --pt with edema--stop Amlodipine.RTC 2 months.  2. Nonrheumatic aortic valve stenosis   3. Chronic kidney disease (CKD), stage III (moderate) (HCC)   4. Type 2 diabetes mellitus with diabetic nephropathy, unspecified whether long term insulin use ([place]) Per Endocrine.  5. Class 1 obesity due to excess calories with serious comorbidity and body mass index (BMI) of 32.0 to 32.9 in adult With HTN/DM.  6. S/P TAVR (transcatheter aortic valve  replacement)   7. Tinea corporis Clotrimazole cream.   Wilhemena Durie, MD  [facility] Fritzi Mandes [place] as a scribe for Wilhemena Durie, MD.,have documented all relevant documentation on the behalf of Wilhemena Durie, MD,as directed by  Wilhemena Durie, MD while in the presence of Wilhemena Durie, MD.

## 2019-05-09 ENCOUNTER — Other Ambulatory Visit: Payer: Self-pay | Admitting: Family Medicine

## 2019-05-09 DIAGNOSIS — Z1231 Encounter for screening mammogram for malignant neoplasm of breast: Secondary | ICD-10-CM

## 2019-05-29 DIAGNOSIS — H401131 Primary open-angle glaucoma, bilateral, mild stage: Secondary | ICD-10-CM | POA: Diagnosis not present

## 2019-05-29 DIAGNOSIS — E113293 Type 2 diabetes mellitus with mild nonproliferative diabetic retinopathy without macular edema, bilateral: Secondary | ICD-10-CM | POA: Diagnosis not present

## 2019-06-24 DIAGNOSIS — R69 Illness, unspecified: Secondary | ICD-10-CM | POA: Diagnosis not present

## 2019-06-27 ENCOUNTER — Inpatient Hospital Stay: Payer: Medicare HMO | Attending: Oncology

## 2019-06-27 ENCOUNTER — Other Ambulatory Visit: Payer: Self-pay

## 2019-06-27 ENCOUNTER — Inpatient Hospital Stay (HOSPITAL_BASED_OUTPATIENT_CLINIC_OR_DEPARTMENT_OTHER): Payer: Medicare HMO | Admitting: Oncology

## 2019-06-27 ENCOUNTER — Encounter: Payer: Self-pay | Admitting: Oncology

## 2019-06-27 VITALS — BP 138/62 | HR 57 | Temp 96.9°F | Resp 16 | Wt 192.5 lb

## 2019-06-27 DIAGNOSIS — E785 Hyperlipidemia, unspecified: Secondary | ICD-10-CM | POA: Diagnosis not present

## 2019-06-27 DIAGNOSIS — Z7982 Long term (current) use of aspirin: Secondary | ICD-10-CM | POA: Insufficient documentation

## 2019-06-27 DIAGNOSIS — Z803 Family history of malignant neoplasm of breast: Secondary | ICD-10-CM

## 2019-06-27 DIAGNOSIS — E119 Type 2 diabetes mellitus without complications: Secondary | ICD-10-CM | POA: Insufficient documentation

## 2019-06-27 DIAGNOSIS — E039 Hypothyroidism, unspecified: Secondary | ICD-10-CM

## 2019-06-27 DIAGNOSIS — N189 Chronic kidney disease, unspecified: Secondary | ICD-10-CM | POA: Diagnosis not present

## 2019-06-27 DIAGNOSIS — D509 Iron deficiency anemia, unspecified: Secondary | ICD-10-CM | POA: Insufficient documentation

## 2019-06-27 DIAGNOSIS — Z79899 Other long term (current) drug therapy: Secondary | ICD-10-CM

## 2019-06-27 DIAGNOSIS — Z794 Long term (current) use of insulin: Secondary | ICD-10-CM | POA: Diagnosis not present

## 2019-06-27 DIAGNOSIS — I10 Essential (primary) hypertension: Secondary | ICD-10-CM | POA: Diagnosis not present

## 2019-06-27 LAB — CBC
HCT: 35.7 % — ABNORMAL LOW (ref 36.0–46.0)
Hemoglobin: 11.4 g/dL — ABNORMAL LOW (ref 12.0–15.0)
MCH: 29.1 pg (ref 26.0–34.0)
MCHC: 31.9 g/dL (ref 30.0–36.0)
MCV: 91.1 fL (ref 80.0–100.0)
Platelets: 209 10*3/uL (ref 150–400)
RBC: 3.92 MIL/uL (ref 3.87–5.11)
RDW: 12.3 % (ref 11.5–15.5)
WBC: 7.6 10*3/uL (ref 4.0–10.5)
nRBC: 0 % (ref 0.0–0.2)

## 2019-06-27 LAB — IRON AND TIBC
Iron: 88 ug/dL (ref 28–170)
Saturation Ratios: 25 % (ref 10.4–31.8)
TIBC: 359 ug/dL (ref 250–450)
UIBC: 271 ug/dL

## 2019-06-27 LAB — FERRITIN: Ferritin: 95 ng/mL (ref 11–307)

## 2019-06-27 NOTE — Progress Notes (Signed)
No concerns from [place]. Works 1 1 /2 days [employer]

## 2019-06-30 ENCOUNTER — Encounter: Payer: Self-pay | Admitting: Oncology

## 2019-06-30 NOTE — Progress Notes (Signed)
Hematology/Oncology Consult note [facility]  [phone] [phone]  Patient Care Team: Jerrol Banana., MD as PCP - General (Family Medicine) Corey Skains, MD as Consulting Physician (Internal Medicine) Rexene Alberts, MD as Consulting Physician (Cardiothoracic Surgery) Leandrew Koyanagi, MD as Referring Physician (Ophthalmology) Gabriel Carina Betsey Holiday, MD as Physician Assistant (Endocrinology) Benjaman Kindler, MD as Consulting Physician (Obstetrics and Gynecology) Lavonia Dana, MD as Consulting Physician (Internal Medicine) Sindy Guadeloupe, MD as Consulting Physician (Oncology)   Name of the patient: Stacey Pierce  [id number]  May 17, 1941   Date of visit: 06/30/19  Diagnosis-iron deficiency anemia possibly secondary to hiatal hernia  Chief complaint/ Reason for visit-routine follow-up of iron deficiency anemia  Heme/Onc history: patient is a 78 year old female with a past medical history significant for obesity, hypertension, hyperlipidemia, diabetes who recently underwent TAVR procedure. She has baseline chronic anemia and her hemoglobin is around 10. Patient was found to have evidence of iron deficiency based on her labs in January 2019 and she was referred to Dr. Marius Ditch for further management. She also had stool occult testing which came back positive. She has been on oral iron since February 2019. She was also taking thousand micrograms of oral B12 until recently. With regards to her TAVR procedure she was on aspirin and Plavix. Subsequently her aspirin was stopped and she will be coming off her Plavix in the next couple of weeks following which she will be getting an EGD and colonoscopy by Dr. Marius Ditch. She has been referred to [place] for management of iron deficiency anemia.  Recent CBC on 04/08/2018 showed white count of 6.5, H&H of 10.6/32 with a platelet count of 258. Iron studies were normal. Ferritin level was low at 15.  B12 level was normal at 852. Prior folate was normal in February 2019.  Patient denies any gross blood in her urine or stool. She denies any consistent use of NSAIDs. Patient received 2 doses of Feraheme in May 2019. She underwent EGD and colonoscopy in June 2019. Colonoscopy revealed small polyp in the descending colon. Nonbleeding internal hemorrhoids EGD showed normal duodenal bulb and normal stomach. Small hiatal hernia. Normal GE junction and esophagus.   Interval history-patient is doing well overall.  She still works part-time about 1-1/2 days a week.  Reports no bleeding in his stool or urine.  Denies any dark melanotic stools  ECOG PS- 1 Pain scale- 0   Review of systems- Review of Systems  Constitutional: Negative for chills, fever, malaise/fatigue and weight loss.  HENT: Negative for congestion, ear discharge and nosebleeds.   Eyes: Negative for blurred vision.  Respiratory: Negative for cough, hemoptysis, sputum production, shortness of breath and wheezing.   Cardiovascular: Negative for chest pain, palpitations, orthopnea and claudication.  Gastrointestinal: Negative for abdominal pain, blood in stool, constipation, diarrhea, heartburn, melena, nausea and vomiting.  Genitourinary: Negative for dysuria, flank pain, frequency, hematuria and urgency.  Musculoskeletal: Negative for back pain, joint pain and myalgias.  Skin: Negative for rash.  Neurological: Negative for dizziness, tingling, focal weakness, seizures, weakness and headaches.  Endo/Heme/Allergies: Does not bruise/bleed easily.  Psychiatric/Behavioral: Negative for depression and suicidal ideas. The patient does not have insomnia.       Allergies  Allergen Reactions  . Hydrocodone-Homatropine     Nausea/dizziness  . Azithromycin Rash    Other reaction(s): Unknown MYCIN GROUP   . Codeine Other (See Comments)    "COLIC"  . Erythromycin Rash  . Penicillins Rash  Has patient had a PCN reaction  causing immediate rash, facial/tongue/throat swelling, SOB or lightheadedness with hypotension: No Has patient had a PCN reaction causing severe rash involving mucus membranes or skin necrosis: No Has patient had a PCN reaction that required hospitalization: No Has patient had a PCN reaction occurring within the last 10 years: No If all of the above answers are "NO", then may proceed with Cephalosporin use.   . Sulfa Antibiotics Other (See Comments)    "COLIC"     Past Medical History:  Diagnosis Date  . Anemia   . Aortic stenosis, severe    a. 09/2017: s/p TAVR  . CKD (chronic kidney disease)   . Diabetes mellitus, insulin dependent (IDDM), uncontrolled ([place])   . DR (diabetic retinopathy) ([place])   . GERD (gastroesophageal reflux disease)   . Glaucoma   . Hyperlipidemia   . Hypertension   . Hypothyroidism   . Obesity   . S/P TAVR (transcatheter aortic valve replacement) 09/04/2017   26 mm Medtronic Evolut Pro transcatheter heart valve placed via percutaneous right transfemoral approach      Past Surgical History:  Procedure Laterality Date  . CATARACT EXTRACTION     left 2010  . CATARACT EXTRACTION W/PHACO Right 10/13/2015   Procedure: CATARACT EXTRACTION PHACO AND INTRAOCULAR LENS PLACEMENT (IOC);  Surgeon: Leandrew Koyanagi, MD;  Location: [facility];  Service: Ophthalmology;  Laterality: Right;  DIABETIC - insulin and oral meds MALYUGIN SHUGARCAINE  . CHOLECYSTECTOMY     1967  . COLONOSCOPY WITH PROPOFOL N/A 05/16/2018   Procedure: COLONOSCOPY WITH PROPOFOL;  Surgeon: Lin Landsman, MD;  Location: [facility] ENDOSCOPY;  Service: Gastroenterology;  Laterality: N/A;  . DILATION AND CURETTAGE OF UTERUS    . ESOPHAGOGASTRODUODENOSCOPY (EGD) WITH PROPOFOL N/A 05/16/2018   Procedure: ESOPHAGOGASTRODUODENOSCOPY (EGD) WITH PROPOFOL;  Surgeon: Lin Landsman, MD;  Location: [place];  Service: Gastroenterology;  Laterality: N/A;  . EYE SURGERY    . LAPAROSCOPIC  TUBAL LIGATION    . RIGHT/LEFT HEART CATH AND CORONARY ANGIOGRAPHY N/A 08/02/2017   Procedure: RIGHT/LEFT HEART CATH AND CORONARY ANGIOGRAPHY;  Surgeon: Corey Skains, MD;  Location: [place] [facility] CV LAB;  Service: Cardiovascular;  Laterality: N/A;  . TEE WITHOUT CARDIOVERSION N/A 09/04/2017   Procedure: TRANSESOPHAGEAL ECHOCARDIOGRAM (TEE);  Surgeon: Sherren Mocha, MD;  Location: [place];  Service: Open Heart Surgery;  Laterality: N/A;  . TONSILLECTOMY     1974  . TRANSCATHETER AORTIC VALVE REPLACEMENT, TRANSFEMORAL N/A 09/04/2017   Procedure: TRANSCATHETER AORTIC VALVE REPLACEMENT, TRANSFEMORAL;  Surgeon: Sherren Mocha, MD;  Location: [place];  Service: Open Heart Surgery;  Laterality: N/A;  . TUBAL LIGATION      Social History   Socioeconomic History  . Marital status: Married    Spouse name: Not on file  . Number of children: 3  . Years of education: Not on file  . Highest education level: GED or equivalent  Occupational History  . Not on file  Social Needs  . Financial resource strain: Not hard at all  . Food insecurity    Worry: Never true    Inability: Never true  . Transportation needs    Medical: No    Non-medical: No  Tobacco Use  . Smoking status: Never Smoker  . Smokeless tobacco: Never Used  Substance and Sexual Activity  . Alcohol use: No    Alcohol/week: 0.0 standard drinks  . Drug use: Never  . Sexual activity: Not Currently  Lifestyle  . Physical activity  Days per week: Not on file    Minutes per session: Not on file  . Stress: Not at all  Relationships  . Social Herbalist on phone: Not on file    Gets together: Not on file    Attends religious service: Not on file    Active member of club or organization: Not on file    Attends meetings of clubs or organizations: Not on file    Relationship status: Not on file  . Intimate partner violence    Fear of current or ex partner: Not on file    Emotionally abused: Not on file     Physically abused: Not on file    Forced sexual activity: Not on file  Other Topics Concern  . Not on file  Social History Narrative  . Not on file    Family History  Problem Relation Age of Onset  . Alzheimer's disease Mother   . Colon polyps Mother   . Hypertension Mother   . Heart attack Father   . Coronary artery disease Father   . Diabetes Father   . Breast cancer Sister   . Colon polyps Sister   . Diabetes Sister   . Diabetes Sister   . Diabetes Sister   . Diabetes Sister      Current Outpatient Medications:  .  acetaminophen (TYLENOL) 500 MG tablet, Take 1,000 mg by mouth every 6 (six) hours as needed for mild pain., Disp: , Rfl:  .  amitriptyline (ELAVIL) 10 MG tablet, Take 10 mg by mouth at bedtime., Disp: , Rfl:  .  aspirin EC 81 MG tablet, Take 81 mg by mouth daily., Disp: , Rfl:  .  atorvastatin (LIPITOR) 40 MG tablet, TAKE 1 TABLET (40 MG TOTAL) BY MOUTH DAILY., Disp: 90 tablet, Rfl: 3 .  BD INSULIN SYRINGE U/F 31G X 5/16" 1 ML MISC, 4 (four) times daily., Disp: , Rfl: 99 .  carvedilol (COREG) 25 MG tablet, TAKE 1 TABLET BY MOUTH 2 TIMES DAILY WITH A MEAL., Disp: 180 tablet, Rfl: 3 .  dorzolamide-timolol (COSOPT) 22.3-6.8 MG/ML ophthalmic solution, Place 1 drop into both eyes 2 (two) times daily., Disp: , Rfl: 5 .  EUTHYROX 100 MCG tablet, TAKE 1 TABLET BY MOUTH ONCE DAILY IN THE MORNING ON AN EMPTY STOMACH, Disp: 90 tablet, Rfl: 0 .  gabapentin (NEURONTIN) 300 MG capsule, Take 1 capsule by mouth., Disp: , Rfl:  .  glucose blood test strip, Use three test strips daily as instructed., Disp: , Rfl:  .  insulin aspart (NOVOLOG) 100 UNIT/ML injection, Inject 20-25 Units into the skin 2 (two) times daily before a meal. Takes 20 units before breakfast and 25 units before dinner., Disp: , Rfl:  .  insulin detemir (LEVEMIR) 100 UNIT/ML injection, Inject 20 units into the skin in the morning and 55 units at bedtime, Disp: , Rfl:  .  latanoprost (XALATAN) 0.005 % ophthalmic  solution, Place 1 drop into both eyes at bedtime. , Disp: , Rfl:  .  losartan (COZAAR) 100 MG tablet, TAKE 1 TABLET BY MOUTH EVERY DAY, Disp: 90 tablet, Rfl: 3 .  metFORMIN (GLUCOPHAGE) 1000 MG tablet, Take 2,000 mg by mouth daily after supper., Disp: , Rfl:  .  pantoprazole (PROTONIX) 40 MG tablet, TAKE 1 TABLET (40 MG TOTAL) BY MOUTH DAILY AT 12 NOON., Disp: 90 tablet, Rfl: 3 .  clotrimazole (LOTRIMIN) 1 % cream, Apply 1 application topically 2 (two) times daily., Disp: 30 g, Rfl:  0 .  DiphenhydrAMINE HCl ([employer] ALLERGY PO), Take 1 tablet by mouth daily., Disp: , Rfl:   Physical exam:  Vitals:   06/27/19 1418  BP: 138/62  Pulse: (!) 57  Resp: 16  Temp: (!) 96.9 F (36.1 C)  TempSrc: Tympanic  Weight: 192 lb 8 oz (87.3 kg)   Physical Exam HENT:     Head: Normocephalic and atraumatic.  Eyes:     Pupils: Pupils are equal, round, and reactive to light.  Neck:     Musculoskeletal: Normal range of motion.  Cardiovascular:     Rate and Rhythm: Normal rate and regular rhythm.     Heart sounds: Normal heart sounds.  Pulmonary:     Effort: Pulmonary effort is normal.     Breath sounds: Normal breath sounds.  Abdominal:     General: Bowel sounds are normal.     Palpations: Abdomen is soft.  Skin:    General: Skin is warm and dry.  Neurological:     Mental Status: She is alert and oriented to person, place, and time.      CMP Latest Ref Rng & Units 12/25/2017  Glucose 65 - 99 mg/dL 158(H)  BUN 8 - 27 mg/dL 25  Creatinine 0.57 - 1.00 mg/dL 1.43(H)  Sodium 134 - 144 mmol/L 140  Potassium 3.5 - 5.2 mmol/L 4.9  Chloride 96 - 106 mmol/L 104  CO2 20 - 29 mmol/L 18(L)  Calcium 8.7 - 10.3 mg/dL 9.2  Total Protein 6.0 - 8.5 g/dL 6.9  Total Bilirubin 0.0 - 1.2 mg/dL <0.2  Alkaline Phos 39 - 117 IU/L 110  AST 0 - 40 IU/L 10  ALT 0 - 32 IU/L 9   CBC Latest Ref Rng & Units 06/27/2019  WBC 4.0 - 10.5 K/uL 7.6  Hemoglobin 12.0 - 15.0 g/dL 11.4(L)  Hematocrit 36.0 - 46.0 % 35.7(L)   Platelets 150 - 400 K/uL 209      Assessment and plan- Patient is a 78 y.o. female with iron deficiency anemia here for routine follow-up  Patient has not required IV iron since May 2019.  She does have baseline anemia of chronic disease and her hemoglobin is between 10-11 and has remained stable.  She can continue to follow-up with her primary care Dr. Rosanna Randy at this time and get her iron studies checked every 3 to 6 months.  She can be referred to [place] in the future if questions or concerns arise   Visit Diagnosis 1. Iron deficiency anemia, unspecified iron deficiency anemia type      Dr. Randa Evens, MD, MPH [facility] [phone] 06/30/2019 8:15 AM

## 2019-07-10 ENCOUNTER — Other Ambulatory Visit: Payer: Self-pay

## 2019-07-10 ENCOUNTER — Encounter: Payer: Self-pay | Admitting: Family Medicine

## 2019-07-10 ENCOUNTER — Ambulatory Visit (INDEPENDENT_AMBULATORY_CARE_PROVIDER_SITE_OTHER): Payer: Medicare HMO | Admitting: Family Medicine

## 2019-07-10 VITALS — BP 110/56 | HR 58 | Temp 98.6°F | Resp 16 | Wt 194.0 lb

## 2019-07-10 DIAGNOSIS — I10 Essential (primary) hypertension: Secondary | ICD-10-CM

## 2019-07-10 DIAGNOSIS — M545 Low back pain, unspecified: Secondary | ICD-10-CM

## 2019-07-10 DIAGNOSIS — Z952 Presence of prosthetic heart valve: Secondary | ICD-10-CM | POA: Diagnosis not present

## 2019-07-10 DIAGNOSIS — M25551 Pain in right hip: Secondary | ICD-10-CM

## 2019-07-10 DIAGNOSIS — E1121 Type 2 diabetes mellitus with diabetic nephropathy: Secondary | ICD-10-CM

## 2019-07-10 DIAGNOSIS — D519 Vitamin B12 deficiency anemia, unspecified: Secondary | ICD-10-CM

## 2019-07-10 MED ORDER — KETOROLAC TROMETHAMINE 60 MG/2ML IM SOLN
60.0000 mg | Freq: Once | INTRAMUSCULAR | Status: AC
  Administered 2019-07-10: 60 mg via INTRAMUSCULAR

## 2019-07-10 MED ORDER — KETOROLAC TROMETHAMINE 30 MG/ML IJ SOLN: 30.0000 mg | Freq: Once | INTRAMUSCULAR | Status: DC

## 2019-07-10 NOTE — Progress Notes (Signed)
Patient: Stacey Pierce Female    DOB: 1941-03-11   78 y.o.   MRN: [id number] Visit Date: 07/10/2019  Today's Provider: Wilhemena Durie, MD   Chief Complaint  Patient presents with  . Hypertension  . Hip Pain   Subjective:     Hip Pain  There was no injury mechanism (chronic problem). The pain is present in the right hip. The pain has been intermittent since onset. Associated symptoms include an inability to bear weight. Pertinent negatives include no loss of motion, loss of sensation, muscle weakness, numbness or tingling. She reports no foreign bodies present. The symptoms are aggravated by movement and weight bearing. She has tried acetaminophen for the symptoms. The treatment provided mild relief.    Hypertension, follow-up:  BP Readings from Last 3 Encounters:  07/10/19 (!) 110/56  06/27/19 138/62  05/08/19 (!) 100/58    She was last seen for hypertension 1 months ago.  BP at that visit was 100/58. Management changes since that visit include d/c Amlodipine.  She is not having side effects.  She is not exercising. She is not adherent to low salt diet.   Outside blood pressures are . She is experiencing none.  Patient denies chest pain, chest pressure/discomfort, claudication, dyspnea, exertional chest pressure/discomfort, fatigue, irregular heart beat, lower extremity edema, near-syncope, orthopnea, palpitations, paroxysmal nocturnal dyspnea, syncope and tachypnea.   Cardiovascular risk factors include advanced age (older than 53 for men, 6 for women) and hypertension.  Use of agents associated with hypertension: NSAIDS.     Weight trend: stable Wt Readings from Last 3 Encounters:  07/10/19 194 lb (88 kg)  06/27/19 192 lb 8 oz (87.3 kg)  05/08/19 198 lb (89.8 kg)    Current diet: well balanced  ------------------------------------------------------------------------  Allergies  Allergen Reactions  . Hydrocodone-Homatropine     Nausea/dizziness  .  Azithromycin Rash    Other reaction(s): Unknown MYCIN GROUP   . Codeine Other (See Comments)    "COLIC"  . Erythromycin Rash  . Penicillins Rash    Has patient had a PCN reaction causing immediate rash, facial/tongue/throat swelling, SOB or lightheadedness with hypotension: No Has patient had a PCN reaction causing severe rash involving mucus membranes or skin necrosis: No Has patient had a PCN reaction that required hospitalization: No Has patient had a PCN reaction occurring within the last 10 years: No If all of the above answers are "NO", then may proceed with Cephalosporin use.   . Sulfa Antibiotics Other (See Comments)    "COLIC"     Current Outpatient Medications:  .  acetaminophen (TYLENOL) 500 MG tablet, Take 1,000 mg by mouth every 6 (six) hours as needed for mild pain., Disp: , Rfl:  .  amitriptyline (ELAVIL) 10 MG tablet, Take 10 mg by mouth at bedtime., Disp: , Rfl:  .  aspirin EC 81 MG tablet, Take 81 mg by mouth daily., Disp: , Rfl:  .  atorvastatin (LIPITOR) 40 MG tablet, TAKE 1 TABLET (40 MG TOTAL) BY MOUTH DAILY., Disp: 90 tablet, Rfl: 3 .  BD INSULIN SYRINGE U/F 31G X 5/16" 1 ML MISC, 4 (four) times daily., Disp: , Rfl: 99 .  carvedilol (COREG) 25 MG tablet, TAKE 1 TABLET BY MOUTH 2 TIMES DAILY WITH A MEAL., Disp: 180 tablet, Rfl: 3 .  clotrimazole (LOTRIMIN) 1 % cream, Apply 1 application topically 2 (two) times daily., Disp: 30 g, Rfl: 0 .  DiphenhydrAMINE HCl ([employer] ALLERGY PO), Take 1 tablet by  mouth daily., Disp: , Rfl:  .  dorzolamide-timolol (COSOPT) 22.3-6.8 MG/ML ophthalmic solution, Place 1 drop into both eyes 2 (two) times daily., Disp: , Rfl: 5 .  EUTHYROX 100 MCG tablet, TAKE 1 TABLET BY MOUTH ONCE DAILY IN THE MORNING ON AN EMPTY STOMACH, Disp: 90 tablet, Rfl: 0 .  gabapentin (NEURONTIN) 300 MG capsule, Take 1 capsule by mouth., Disp: , Rfl:  .  glucose blood test strip, Use three test strips daily as instructed., Disp: , Rfl:  .  insulin aspart (NOVOLOG)  100 UNIT/ML injection, Inject 20-25 Units into the skin 2 (two) times daily before a meal. Takes 20 units before breakfast and 25 units before dinner., Disp: , Rfl:  .  insulin detemir (LEVEMIR) 100 UNIT/ML injection, Inject 20 units into the skin in the morning and 55 units at bedtime, Disp: , Rfl:  .  latanoprost (XALATAN) 0.005 % ophthalmic solution, Place 1 drop into both eyes at bedtime. , Disp: , Rfl:  .  losartan (COZAAR) 100 MG tablet, TAKE 1 TABLET BY MOUTH EVERY DAY, Disp: 90 tablet, Rfl: 3 .  metFORMIN (GLUCOPHAGE) 1000 MG tablet, Take 2,000 mg by mouth daily after supper., Disp: , Rfl:  .  pantoprazole (PROTONIX) 40 MG tablet, TAKE 1 TABLET (40 MG TOTAL) BY MOUTH DAILY AT 12 NOON., Disp: 90 tablet, Rfl: 3  Review of Systems  Constitutional: Positive for activity change.  HENT: Negative.   Eyes: Negative.   Respiratory: Negative.   Cardiovascular: Negative for chest pain, palpitations and leg swelling.  Gastrointestinal: Negative.   Endocrine: Negative.   Musculoskeletal: Positive for arthralgias, back pain and myalgias. Negative for gait problem, joint swelling, neck pain and neck stiffness.  Allergic/Immunologic: Negative.   Neurological: Negative for dizziness, tingling, light-headedness, numbness and headaches.  Psychiatric/Behavioral: Negative.     Social History   Tobacco Use  . Smoking status: Never Smoker  . Smokeless tobacco: Never Used  Substance Use Topics  . Alcohol use: No    Alcohol/week: 0.0 standard drinks      Objective:   BP (!) 110/56   Pulse (!) 58   Temp 98.6 F (37 C) (Oral)   Resp 16   Wt 194 lb (88 kg)   SpO2 96%   BMI 31.31 kg/m  Vitals:   07/10/19 1343  BP: (!) 110/56  Pulse: (!) 58  Resp: 16  Temp: 98.6 F (37 C)  TempSrc: Oral  SpO2: 96%  Weight: 194 lb (88 kg)     Physical Exam Vitals signs reviewed.  Constitutional:      Appearance: She is well-developed.  HENT:     Head: Normocephalic and atraumatic.     Right Ear:  External ear normal.     Left Ear: External ear normal.  Eyes:     General: No scleral icterus.    Conjunctiva/sclera: Conjunctivae normal.  Neck:     Thyroid: No thyromegaly.  Cardiovascular:     Rate and Rhythm: Normal rate and regular rhythm.     Heart sounds: Normal heart sounds.  Pulmonary:     Effort: Pulmonary effort is normal.     Breath sounds: Normal breath sounds.  Abdominal:     Palpations: Abdomen is soft.  Musculoskeletal:        General: No tenderness or deformity.     Comments: Her discomfort she describes as hip pain is pain in the region of the right SI joint.  Skin:    General: Skin is warm and dry.  Capillary Refill: Capillary refill takes more than 3 seconds.  Neurological:     Mental Status: She is alert and oriented to person, place, and time.  Psychiatric:        Behavior: Behavior normal.        Thought Content: Thought content normal.        Judgment: Judgment normal.      No results found for any visits on 07/10/19.     Assessment & Plan      1. Bilateral low back pain without sciatica, unspecified chronicity Refer to PT.Toradol 30mg  IM daily.More than 50% 25 minute visit spent in counseling or coordination of care  - DG Lumbar Spine Complete; Future - Ambulatory referral to Physical Therapy - ketorolac (TORADOL) injection 60 mg  2. Pain of right hip joint May need ortho referral. - Ambulatory referral to Physical Therapy - ketorolac (TORADOL) injection 60 mg - DG HIP UNILAT WITH PELVIS 2-3 VIEWS RIGHT; Future  3. Type 2 diabetes mellitus with diabetic nephropathy, unspecified whether long term insulin use (HCC) A1C is 7.1 today. No changes.  4. Benign essential HTN   5. S/P TAVR (transcatheter aortic valve replacement)   6. Anemia due to vitamin B12 deficiency, unspecified B12 deficiency type      Wilhemena Durie, MD  [facility] Fritzi Mandes [place],acting as a scribe for  Wilhemena Durie, MD.,have documented all relevant documentation on the behalf of Wilhemena Durie, MD,as directed by  Wilhemena Durie, MD while in the presence of Wilhemena Durie, MD.

## 2019-07-11 ENCOUNTER — Ambulatory Visit
Admission: RE | Admit: 2019-07-11 | Discharge: 2019-07-11 | Disposition: A | Discharge status: Home / Self Care | Payer: Medicare HMO | Source: Ambulatory Visit | Attending: Family Medicine | Admitting: Family Medicine

## 2019-07-11 ENCOUNTER — Other Ambulatory Visit: Payer: Self-pay

## 2019-07-11 DIAGNOSIS — M1611 Unilateral primary osteoarthritis, right hip: Secondary | ICD-10-CM | POA: Diagnosis not present

## 2019-07-11 DIAGNOSIS — M545 Low back pain, unspecified: Secondary | ICD-10-CM

## 2019-07-11 DIAGNOSIS — M25551 Pain in right hip: Secondary | ICD-10-CM

## 2019-07-11 DIAGNOSIS — H04202 Unspecified epiphora, left lacrimal gland: Secondary | ICD-10-CM | POA: Diagnosis not present

## 2019-07-16 ENCOUNTER — Ambulatory Visit
Admission: RE | Admit: 2019-07-16 | Discharge: 2019-07-16 | Disposition: A | Discharge status: Home / Self Care | Payer: Medicare HMO | Source: Ambulatory Visit | Attending: Family Medicine | Admitting: Family Medicine

## 2019-07-16 ENCOUNTER — Telehealth: Payer: Self-pay

## 2019-07-16 ENCOUNTER — Other Ambulatory Visit: Payer: Self-pay

## 2019-07-16 DIAGNOSIS — Z1231 Encounter for screening mammogram for malignant neoplasm of breast: Secondary | ICD-10-CM | POA: Insufficient documentation

## 2019-07-16 NOTE — Telephone Encounter (Signed)
-----   Message from Jerrol Banana., MD sent at 07/16/2019  8:06 AM EDT ----- Some arthritis of hip and DDD of back.

## 2019-07-16 NOTE — Telephone Encounter (Signed)
No answer

## 2019-07-16 NOTE — Telephone Encounter (Signed)
Patient notified of lab results

## 2019-07-22 NOTE — Progress Notes (Signed)
Subjective:   Stacey Pierce is a 78 y.o. female who presents for Medicare Annual (Subsequent) preventive examination.    This visit is being conducted through telemedicine due to the COVID-19 pandemic. This patient has given me verbal consent via doximity to conduct this visit, patient states they are participating from their home address. Some vital signs may be absent or patient reported.    Patient identification: identified by name, DOB, and current address  Review of Systems:  N/A  Cardiac Risk Factors include: advanced age (>62men, >74 women);diabetes mellitus;dyslipidemia;hypertension;obesity (BMI >30kg/m2)     Objective:     Vitals: There were no vitals taken for this visit.  There is no height or weight on file to calculate BMI. Unable to obtain vitals due to visit being conducted via telephonically.   Advanced Directives 07/23/2019 06/27/2019 12/27/2018 07/18/2018 06/21/2018 05/16/2018 04/16/2018  Does Patient Have a Medical Advance Directive? No No No No No No No  Would patient like information on creating a medical advance directive? No - Patient declined No - Patient declined No - Patient declined No - Patient declined - - No - Patient declined    Tobacco Social History   Tobacco Use  Smoking Status Never Smoker  Smokeless Tobacco Never Used     Counseling given: Not Answered   Clinical Intake:  Pre-visit preparation completed: Yes  Pain : No/denies pain Pain Score: 0-No pain     Nutritional Risks: None Diabetes: Yes  How often do you need to have someone help you when you read instructions, pamphlets, or other written materials from your doctor or pharmacy?: 1 - Never   Diabetes:  Is the patient diabetic?  Yes type 2 If diabetic, was a CBG obtained today?  No  Did the patient bring in their glucometer from home?  No  How often do you monitor your CBG's? Twice a day.   Financial Strains and Diabetes Management:  Are you having any financial  strains with the device, your supplies or your medication? No .  Does the patient want to be seen by Chronic Care Management for management of their diabetes?  No  Would the patient like to be referred to a Nutritionist or for Diabetic Management?  No   Diabetic Exams:  Diabetic Eye Exam: Completed 01/13/19. Repeat yearly.   Diabetic Foot Exam: Completed 04/18/19. Repeat yearly.    Interpreter Needed?: No  Information entered by :: [facility], LPN  Past Medical History:  Diagnosis Date  . Anemia   . Aortic stenosis, severe    a. 09/2017: s/p TAVR  . CKD (chronic kidney disease)   . Diabetes mellitus, insulin dependent (IDDM), uncontrolled ([place])   . DR (diabetic retinopathy) ([place])   . GERD (gastroesophageal reflux disease)   . Glaucoma   . Hyperlipidemia   . Hypertension   . Hypothyroidism   . Obesity   . S/P TAVR (transcatheter aortic valve replacement) 09/04/2017   26 mm Medtronic Evolut Pro transcatheter heart valve placed via percutaneous right transfemoral approach    Past Surgical History:  Procedure Laterality Date  . CATARACT EXTRACTION     left 2010  . CATARACT EXTRACTION W/PHACO Right 10/13/2015   Procedure: CATARACT EXTRACTION PHACO AND INTRAOCULAR LENS PLACEMENT (IOC);  Surgeon: Leandrew Koyanagi, MD;  Location: [facility];  Service: Ophthalmology;  Laterality: Right;  DIABETIC - insulin and oral meds MALYUGIN SHUGARCAINE  . CHOLECYSTECTOMY     1967  . COLONOSCOPY WITH PROPOFOL N/A 05/16/2018   Procedure: COLONOSCOPY  WITH PROPOFOL;  Surgeon: Lin Landsman, MD;  Location[facility] ENDOSCOPY;  Service: Gastroenterology;  Laterality: N/A;  . DILATION AND CURETTAGE OF UTERUS    . ESOPHAGOGASTRODUODENOSCOPY (EGD) WITH PROPOFOL N/A 05/16/2018   Procedure: ESOPHAGOGASTRODUODENOSCOPY (EGD) WITH PROPOFOL;  Surgeon: Lin Landsman, MD;  Location: [facility];  Service: Gastroenterology;  Laterality: N/A;  . EYE SURGERY    . LAPAROSCOPIC TUBAL LIGATION     . RIGHT/LEFT HEART CATH AND CORONARY ANGIOGRAPHY N/A 08/02/2017   Procedure: RIGHT/LEFT HEART CATH AND CORONARY ANGIOGRAPHY;  Surgeon: Corey Skains, MD;  Location: [facility] CV LAB;  Service: Cardiovascular;  Laterality: N/A;  . TEE WITHOUT CARDIOVERSION N/A 09/04/2017   Procedure: TRANSESOPHAGEAL ECHOCARDIOGRAM (TEE);  Surgeon: Sherren Mocha, MD;  Location: [facility];  Service: Open Heart Surgery;  Laterality: N/A;  . TONSILLECTOMY     1974  . TRANSCATHETER AORTIC VALVE REPLACEMENT, TRANSFEMORAL N/A 09/04/2017   Procedure: TRANSCATHETER AORTIC VALVE REPLACEMENT, TRANSFEMORAL;  Surgeon: Sherren Mocha, MD;  Location: [facility];  Service: Open Heart Surgery;  Laterality: N/A;  . TUBAL LIGATION     Family History  Problem Relation Age of Onset  . Alzheimer's disease Mother   . Colon polyps Mother   . Hypertension Mother   . Heart attack Father   . Coronary artery disease Father   . Diabetes Father   . Breast cancer Sister   . Colon polyps Sister   . Diabetes Sister   . Diabetes Sister   . Diabetes Sister   . Diabetes Sister    Social History   Socioeconomic History  . Marital status: Married    Spouse name: Not on file  . Number of children: 3  . Years of education: Not on file  . Highest education level: GED or equivalent  Occupational History    Employer: [employer]    Comment: part time  Social Needs  . Financial resource strain: Not hard at all  . Food insecurity    Worry: Never true    Inability: Never true  . Transportation needs    Medical: No    Non-medical: No  Tobacco Use  . Smoking status: Never Smoker  . Smokeless tobacco: Never Used  Substance and Sexual Activity  . Alcohol use: No    Alcohol/week: 0.0 standard drinks  . Drug use: Never  . Sexual activity: Not Currently  Lifestyle  . Physical activity    Days per week: 0 days    Minutes per session: 0 min  . Stress: Not at all  Relationships  . Social Herbalist on phone:  Patient refused    Gets together: Patient refused    Attends religious service: Patient refused    Active member of club or organization: Patient refused    Attends meetings of clubs or organizations: Patient refused    Relationship status: Patient refused  Other Topics Concern  . Not on file  Social History Narrative  . Not on file    Outpatient Encounter Medications as of 07/23/2019  Medication Sig  . acetaminophen (TYLENOL) 500 MG tablet Take 1,000 mg by mouth every 6 (six) hours as needed for mild pain.  Marland Kitchen amitriptyline (ELAVIL) 10 MG tablet Take 10 mg by mouth at bedtime.  Marland Kitchen aspirin EC 81 MG tablet Take 81 mg by mouth daily.  Marland Kitchen atorvastatin (LIPITOR) 40 MG tablet TAKE 1 TABLET (40 MG TOTAL) BY MOUTH DAILY.  . BD INSULIN SYRINGE U/F 31G X 5/16" 1 ML  MISC 4 (four) times daily.  . carvedilol (COREG) 25 MG tablet TAKE 1 TABLET BY MOUTH 2 TIMES DAILY WITH A MEAL.  . clotrimazole (LOTRIMIN) 1 % cream Apply 1 application topically 2 (two) times daily.  . DiphenhydrAMINE HCl ([employer] ALLERGY PO) Take 1 tablet by mouth daily.  . dorzolamide-timolol (COSOPT) 22.3-6.8 MG/ML ophthalmic solution Place 1 drop into both eyes 2 (two) times daily.  . EUTHYROX 100 MCG tablet TAKE 1 TABLET BY MOUTH ONCE DAILY IN THE MORNING ON AN EMPTY STOMACH  . gabapentin (NEURONTIN) 300 MG capsule Take 1 capsule by mouth.  Marland Kitchen glucose blood test strip Use three test strips daily as instructed.  . insulin aspart (NOVOLOG) 100 UNIT/ML injection Inject 20-25 Units into the skin 2 (two) times daily before a meal. Takes 20 units before breakfast and 25 units before dinner.  . insulin detemir (LEVEMIR) 100 UNIT/ML injection Inject 20 units into the skin in the morning and 55 units at bedtime  . latanoprost (XALATAN) 0.005 % ophthalmic solution Place 1 drop into both eyes at bedtime.   Marland Kitchen losartan (COZAAR) 100 MG tablet TAKE 1 TABLET BY MOUTH EVERY DAY  . metFORMIN (GLUCOPHAGE) 1000 MG tablet Take 2,000 mg by mouth daily after  supper.  . Olopatadine HCl (PATADAY OP) Apply 1 drop to eye 2 (two) times daily. In left eye  . pantoprazole (PROTONIX) 40 MG tablet TAKE 1 TABLET (40 MG TOTAL) BY MOUTH DAILY AT 12 NOON.   No facility-administered encounter medications on file as of 07/23/2019.     Activities of Daily Living In your present state of health, do you have any difficulty performing the following activities: 07/23/2019  Hearing? N  Vision? N  Difficulty concentrating or making decisions? N  Walking or climbing stairs? Y  Comment Due to back and hip pain.  Dressing or bathing? N  Doing errands, shopping? N  Preparing Food and eating ? N  Using the Toilet? N  In the past six months, have you accidently leaked urine? Y  Comment Occasionally with urges. Was told bladder needed to be tacked.  Do you have problems with loss of bowel control? N  Managing your Medications? N  Managing your Finances? N  Housekeeping or managing your Housekeeping? N  Some recent data might be hidden    Patient Care Team: Jerrol Banana., MD as PCP - General (Family Medicine) Corey Skains, MD as Consulting Physician (Internal Medicine) Leandrew Koyanagi, MD as Referring Physician (Ophthalmology) Solum, Betsey Holiday, MD as Physician Assistant (Endocrinology) Benjaman Kindler, MD as Consulting Physician (Obstetrics and Gynecology) Lavonia Dana, MD as Consulting Physician (Internal Medicine) Ralene Bathe, MD (Dermatology)    Assessment:   This is a routine wellness examination for Stacey Pierce.  Exercise Activities and Dietary recommendations Current Exercise Habits: The patient does not participate in regular exercise at present, Exercise limited by: orthopedic condition(s)  Goals    . DIET - REDUCE SUGAR INTAKE     Recommend decreasing amount of sugar and sweets intake to minimal if not none.     . Increase water intake     Recommend increasing water intake to 3 glasses a day.        Fall Risk: Fall Risk   07/23/2019 07/18/2018 04/24/2018 05/17/2017 11/02/2016  Falls in the past year? 0 Yes No No Yes  Comment - - - - -  Number falls in past yr: - 1 - - 1  Injury with Fall? - No - - No  Comment - - - - -  Risk Factor Category  - - - - -  Risk for fall due to : - - - - -  Follow up - Falls prevention discussed - - -    FALL RISK PREVENTION PERTAINING TO THE HOME:  Any stairs in or around the home? Yes  If so, are there any without handrails? No   Home free of loose throw rugs in walkways, pet beds, electrical cords, etc? Yes  Adequate lighting in your home to reduce risk of falls? Yes   ASSISTIVE DEVICES UTILIZED TO PREVENT FALLS:  Life alert? No  Use of a cane, walker or w/c? No  Grab bars in the bathroom? Yes  Shower chair or bench in shower? Yes  Elevated toilet seat or a handicapped toilet? Yes   TIMED UP AND GO:  Was the test performed? No .    Depression Screen PHQ 2/9 Scores 07/23/2019 07/18/2018 05/17/2017 05/17/2017  PHQ - 2 Score 0 0 0 0  PHQ- 9 Score - 0 3 -     Cognitive Function     6CIT Screen 07/23/2019 05/17/2017 11/02/2016  What Year? 0 points 0 points 0 points  What month? 0 points 0 points 0 points  What time? 0 points 0 points 0 points  Count back from 20 0 points 0 points 0 points  Months in reverse 0 points 0 points 0 points  Repeat phrase 0 points 2 points 0 points  Total Score 0 2 0    Immunization History  Administered Date(s) Administered  . Influenza, High Dose Seasonal PF 08/26/2015, 11/02/2016, 09/05/2017, 09/25/2018  . Pneumococcal Conjugate-13 02/25/2015  . Pneumococcal Polysaccharide-23 10/06/2013    Qualifies for Shingles Vaccine? Yes, due for Shingrix. Education has been provided regarding the importance of this vaccine. Pt has been advised to call insurance company to determine out of pocket expense. Advised may also receive vaccine at local pharmacy or Health Dept. Verbalized acceptance and understanding.  Tdap: Although this vaccine  is not a covered service during a Wellness Exam, does the patient still wish to receive this vaccine today?  No .   Flu Vaccine: Due fall 2020  Pneumococcal Vaccine: Completed series  Screening Tests Health Maintenance  Topic Date Due  . DEXA SCAN  08/30/2004  . INFLUENZA VACCINE  07/05/2019  . TETANUS/TDAP  12/04/2026 (Originally 12/11/1959)  . HEMOGLOBIN A1C  10/19/2019  . OPHTHALMOLOGY EXAM  01/14/2020  . FOOT EXAM  04/17/2020  . PNA vac Low Risk Adult  Completed    Cancer Screenings:  Colorectal Screening: No longer required.   Mammogram: No longer required.   Bone Density: Completed 08/30/01. Results reflect NORMAL. Repeat every 3 years as advised previously. Ordered today. Pt provided with contact info and advised to call to schedule appt. Pt aware the office will call re: appt.  Lung Cancer Screening: (Low Dose CT Chest recommended if Age 15-80 years, 30 pack-year currently smoking OR have quit w/in 15years.) does not qualify.   Additional Screening:  Dental Screening: Recommended annual dental exams for proper oral hygiene   Community Resource Referral:  CRR required this visit?  No       Plan:  I have personally reviewed and addressed the Medicare Annual Wellness questionnaire and have noted the following in the patient's chart:  A. Medical and social history B. Use of alcohol, tobacco or illicit drugs  C. Current medications and supplements D. Functional ability and status E.  Nutritional status F.  Physical activity G. Advance directives H. List of other physicians I.  Hospitalizations, surgeries, and ER visits in previous 12 months J.  [place] such as hearing and vision if needed, cognitive and depression L. Referrals and appointments   In addition, I have reviewed and discussed with patient certain preventive protocols, quality metrics, and best practice recommendations. A written personalized care plan for preventive services as well as  general preventive health recommendations were provided to patient.   Glendora Score, [place]  5/[id number]/[id number] Nurse Health Advisor   Nurse Notes: Due for influenza vaccine.

## 2019-07-23 ENCOUNTER — Other Ambulatory Visit: Payer: Self-pay

## 2019-07-23 ENCOUNTER — Ambulatory Visit (INDEPENDENT_AMBULATORY_CARE_PROVIDER_SITE_OTHER): Payer: Medicare HMO

## 2019-07-23 DIAGNOSIS — Z0000 Encounter for general adult medical examination without abnormal findings: Secondary | ICD-10-CM | POA: Diagnosis not present

## 2019-07-23 DIAGNOSIS — Z13820 Encounter for screening for osteoporosis: Secondary | ICD-10-CM | POA: Diagnosis not present

## 2019-07-23 NOTE — Patient Instructions (Signed)
Stacey Pierce , Thank you for taking time to come for your Medicare Wellness Visit. I appreciate your ongoing commitment to your health goals. Please review the following plan we discussed and let me know if I can assist you in the future.   Screening recommendations/referrals: Colonoscopy: No longer required.  Mammogram: No longer required.  Bone Density: Up to date, due 08/2004 Recommended yearly ophthalmology/optometry visit for glaucoma screening and checkup Recommended yearly dental visit for hygiene and checkup  Vaccinations: Influenza vaccine: Currently due. Pneumococcal vaccine: Completed series Tdap vaccine: Pt declines today.  Shingles vaccine: Pt declines today.     Advanced directives: Advance directive discussed with you today. Even though you declined this today please call our office should you change your mind and we can give you the proper paperwork for you to fill out.  Conditions/risks identified: Continue to increase water intake to 6-8 8 oz glasses a day and to avoid all sweets if possible.   Next appointment: 07/24/19 @ 2:00 PM with Rosanna Randy.    Preventive Care 2 Years and Older, Female Preventive care refers to lifestyle choices and visits with your health care provider that can promote health and wellness. What does preventive care include?  A yearly physical exam. This is also called an annual well check.  Dental exams once or twice a year.  Routine eye exams. Ask your health care provider how often you should have your eyes checked.  Personal lifestyle choices, including:  Daily care of your teeth and gums.  Regular physical activity.  Eating a healthy diet.  Avoiding tobacco and drug use.  Limiting alcohol use.  Practicing safe sex.  Taking low-dose aspirin every day.  Taking vitamin and mineral supplements as recommended by your health care provider. What happens during an annual well check? The services and screenings done by your health  care provider during your annual well check will depend on your age, overall health, lifestyle risk factors, and family history of disease. Counseling  Your health care provider may ask you questions about your:  Alcohol use.  Tobacco use.  Drug use.  Emotional well-being.  Home and relationship well-being.  Sexual activity.  Eating habits.  History of falls.  Memory and ability to understand (cognition).  Work and work Statistician.  Reproductive health. Screening  You may have the following tests or measurements:  Height, weight, and BMI.  Blood pressure.  Lipid and cholesterol levels. These may be checked every 5 years, or more frequently if you are over 39 years old.  Skin check.  Lung cancer screening. You may have this screening every year starting at age 49 if you have a 30-pack-year history of smoking and currently smoke or have quit within the past 15 years.  Fecal occult blood test (FOBT) of the stool. You may have this test every year starting at age 64.  Flexible sigmoidoscopy or colonoscopy. You may have a sigmoidoscopy every 5 years or a colonoscopy every 10 years starting at age 11.  Hepatitis C blood test.  Hepatitis B blood test.  Sexually transmitted disease (STD) testing.  Diabetes screening. This is done by checking your blood sugar (glucose) after you have not eaten for a while (fasting). You may have this done every 1-3 years.  Bone density scan. This is done to screen for osteoporosis. You may have this done starting at age 54.  Mammogram. This may be done every 1-2 years. Talk to your health care provider about how often you should have regular  mammograms. Talk with your health care provider about your test results, treatment options, and if necessary, the need for more tests. Vaccines  Your health care provider may recommend certain vaccines, such as:  Influenza vaccine. This is recommended every year.  Tetanus, diphtheria, and  acellular pertussis (Tdap, Td) vaccine. You may need a Td booster every 10 years.  Zoster vaccine. You may need this after age 104.  Pneumococcal 13-valent conjugate (PCV13) vaccine. One dose is recommended after age 75.  Pneumococcal polysaccharide (PPSV23) vaccine. One dose is recommended after age 8. Talk to your health care provider about which screenings and vaccines you need and how often you need them. This information is not intended to replace advice given to you by your health care provider. Make sure you discuss any questions you have with your health care provider. Document Released: 12/17/2015 Document Revised: 08/09/2016 Document Reviewed: 09/21/2015 Elsevier Interactive Patient Education  2017 [employer] Prevention in the Home Falls can cause injuries. They can happen to people of all ages. There are many things you can do to make your home safe and to help prevent falls. What can I do on the outside of my home?  Regularly fix the edges of walkways and driveways and fix any cracks.  Remove anything that might make you trip as you walk through a door, such as a raised step or threshold.  Trim any bushes or trees on the path to your home.  Use bright outdoor lighting.  Clear any walking paths of anything that might make someone trip, such as rocks or tools.  Regularly check to see if handrails are loose or broken. Make sure that both sides of any steps have handrails.  Any raised decks and porches should have guardrails on the edges.  Have any leaves, snow, or ice cleared regularly.  Use sand or salt on walking paths during winter.  Clean up any spills in your garage right away. This includes oil or grease spills. What can I do in the bathroom?  Use night lights.  Install grab bars by the toilet and in the tub and shower. Do not use towel bars as grab bars.  Use non-skid mats or decals in the tub or shower.  If you need to sit down in the shower, use  a plastic, non-slip stool.  Keep the floor dry. Clean up any water that spills on the floor as soon as it happens.  Remove soap buildup in the tub or shower regularly.  Attach bath mats securely with double-sided non-slip rug tape.  Do not have throw rugs and other things on the floor that can make you trip. What can I do in the bedroom?  Use night lights.  Make sure that you have a light by your bed that is easy to reach.  Do not use any sheets or blankets that are too big for your bed. They should not hang down onto the floor.  Have a firm chair that has side arms. You can use this for support while you get dressed.  Do not have throw rugs and other things on the floor that can make you trip. What can I do in the kitchen?  Clean up any spills right away.  Avoid walking on wet floors.  Keep items that you use a lot in easy-to-reach places.  If you need to reach something above you, use a strong step stool that has a grab bar.  Keep electrical cords out of the way.  Do not use floor polish or wax that makes floors slippery. If you must use wax, use non-skid floor wax.  Do not have throw rugs and other things on the floor that can make you trip. What can I do with my stairs?  Do not leave any items on the stairs.  Make sure that there are handrails on both sides of the stairs and use them. Fix handrails that are broken or loose. Make sure that handrails are as long as the stairways.  Check any carpeting to make sure that it is firmly attached to the stairs. Fix any carpet that is loose or worn.  Avoid having throw rugs at the top or bottom of the stairs. If you do have throw rugs, attach them to the floor with carpet tape.  Make sure that you have a light switch at the top of the stairs and the bottom of the stairs. If you do not have them, ask someone to add them for you. What else can I do to help prevent falls?  Wear shoes that:  Do not have high heels.  Have  rubber bottoms.  Are comfortable and fit you well.  Are closed at the toe. Do not wear sandals.  If you use a stepladder:  Make sure that it is fully opened. Do not climb a closed stepladder.  Make sure that both sides of the stepladder are locked into place.  Ask someone to hold it for you, if possible.  Clearly mark and make sure that you can see:  Any grab bars or handrails.  First and last steps.  Where the edge of each step is.  Use tools that help you move around (mobility aids) if they are needed. These include:  Canes.  Walkers.  Scooters.  Crutches.  Turn on the lights when you go into a dark area. Replace any light bulbs as soon as they burn out.  Set up your furniture so you have a clear path. Avoid moving your furniture around.  If any of your floors are uneven, fix them.  If there are any pets around you, be aware of where they are.  Review your medicines with your doctor. Some medicines can make you feel dizzy. This can increase your chance of falling. Ask your doctor what other things that you can do to help prevent falls. This information is not intended to replace advice given to you by your health care provider. Make sure you discuss any questions you have with your health care provider. Document Released: 09/16/2009 Document Revised: 04/27/2016 Document Reviewed: 12/25/2014 Elsevier Interactive Patient Education  2017 [employer].

## 2019-07-24 ENCOUNTER — Encounter: Payer: Self-pay | Admitting: Family Medicine

## 2019-07-24 ENCOUNTER — Ambulatory Visit: Payer: Medicare HMO

## 2019-07-24 ENCOUNTER — Ambulatory Visit (INDEPENDENT_AMBULATORY_CARE_PROVIDER_SITE_OTHER): Payer: Medicare HMO | Admitting: Family Medicine

## 2019-07-24 ENCOUNTER — Other Ambulatory Visit: Payer: Self-pay

## 2019-07-24 VITALS — BP 130/60 | HR 80 | Temp 98.4°F | Resp 16 | Ht 66.0 in | Wt 193.0 lb

## 2019-07-24 DIAGNOSIS — G8929 Other chronic pain: Secondary | ICD-10-CM | POA: Diagnosis not present

## 2019-07-24 DIAGNOSIS — E1121 Type 2 diabetes mellitus with diabetic nephropathy: Secondary | ICD-10-CM | POA: Diagnosis not present

## 2019-07-24 DIAGNOSIS — M545 Low back pain, unspecified: Secondary | ICD-10-CM

## 2019-07-24 DIAGNOSIS — Z0000 Encounter for general adult medical examination without abnormal findings: Secondary | ICD-10-CM | POA: Diagnosis not present

## 2019-07-24 DIAGNOSIS — E039 Hypothyroidism, unspecified: Secondary | ICD-10-CM | POA: Diagnosis not present

## 2019-07-24 DIAGNOSIS — E7800 Pure hypercholesterolemia, unspecified: Secondary | ICD-10-CM | POA: Diagnosis not present

## 2019-07-24 DIAGNOSIS — I10 Essential (primary) hypertension: Secondary | ICD-10-CM

## 2019-07-24 NOTE — Progress Notes (Signed)
Patient: Stacey Pierce, Female    DOB: October 05, 1941, 78 y.o.   MRN: [id number] Visit Date: 07/24/2019  Today's Provider: Wilhemena Durie, MD   Chief Complaint  Patient presents with  . Annual Exam   Subjective:   Patient had AWV on 07/23/2019   Annual Physical visit Stacey Pierce is a 78 y.o. female. She feels well. She reports exercising not regularly. She reports she is sleeping well. She has been married for 15 years and has 3 sons and 8 grandchildren. Im depomedrol did nt help her back pain. Dr Stacey Pierce follows her DM.  Colonoscopy- 05/16/2018. Tubular adenoma. Repeat in 1yrs.  Mammogram- 07/16/2019. Repeat 1 yr.  Immunization History  Administered Date(s) Administered  . Influenza, High Dose Seasonal PF 08/26/2015, 11/02/2016, 09/05/2017, 09/25/2018  . Pneumococcal Conjugate-13 02/25/2015  . Pneumococcal Polysaccharide-23 10/06/2013    Review of Systems  Constitutional: Negative.   HENT: Negative.   Eyes: Negative.   Respiratory: Negative.   Cardiovascular: Negative.   Gastrointestinal: Negative.   Endocrine: Negative.   Genitourinary: Negative.   Musculoskeletal: Positive for arthralgias and back pain.  Skin: Negative.   Allergic/Immunologic: Negative.   Neurological: Negative.   Hematological: Negative.   Psychiatric/Behavioral: Negative.     Social History   Socioeconomic History  . Marital status: Married    Spouse name: Not on file  . Number of children: 3  . Years of education: Not on file  . Highest education level: GED or equivalent  Occupational History    Employer: [employer]    Comment: part time  Social Needs  . Financial resource strain: Not hard at all  . Food insecurity    Worry: Never true    Inability: Never true  . Transportation needs    Medical: No    Non-medical: No  Tobacco Use  . Smoking status: Never Smoker  . Smokeless tobacco: Never Used  Substance and Sexual Activity  . Alcohol use: No     Alcohol/week: 0.0 standard drinks  . Drug use: Never  . Sexual activity: Not Currently  Lifestyle  . Physical activity    Days per week: 0 days    Minutes per session: 0 min  . Stress: Not at all  Relationships  . Social Herbalist on phone: Patient refused    Gets together: Patient refused    Attends religious service: Patient refused    Active member of club or organization: Patient refused    Attends meetings of clubs or organizations: Patient refused    Relationship status: Patient refused  . Intimate partner violence    Fear of current or ex partner: Patient refused    Emotionally abused: Patient refused    Physically abused: Patient refused    Forced sexual activity: Patient refused  Other Topics Concern  . Not on file  Social History Narrative  . Not on file    Past Medical History:  Diagnosis Date  . Anemia   . Aortic stenosis, severe    a. 09/2017: s/p TAVR  . CKD (chronic kidney disease)   . Diabetes mellitus, insulin dependent (IDDM), uncontrolled (Stacey Pierce)   . DR (diabetic retinopathy) (Stacey Pierce)   . GERD (gastroesophageal reflux disease)   . Glaucoma   . Hyperlipidemia   . Hypertension   . Hypothyroidism   . Obesity   . S/P TAVR (transcatheter aortic valve replacement) 09/04/2017   26 mm Medtronic Evolut Pro transcatheter heart valve placed via  percutaneous right transfemoral approach      Patient Active Problem List   Diagnosis Date Noted  . Venous insufficiency of both lower extremities 06/13/2018  . Iron deficiency anemia due to chronic blood loss 04/10/2018  . B12 deficiency anemia 03/13/2018  . Dizziness 01/10/2018  . Obesity   . S/P TAVR (transcatheter aortic valve replacement) 09/04/2017  . Aortic stenosis, severe 07/30/2017  . Allergic rhinitis 08/26/2015  . Airway hyperreactivity 08/26/2015  . Back pain, chronic 08/26/2015  . Diabetes ([place]) 08/26/2015  . Degeneration of lumbar or lumbosacral intervertebral disc 08/26/2015  . Acid reflux  08/26/2015  . Glaucoma 08/26/2015  . HLD (hyperlipidemia) 08/26/2015  . Adiposity 08/26/2015  . Benign essential HTN 04/29/2015  . Arteriosclerosis of coronary artery 01/26/2015  . Retinopathy 11/19/2014  . Chronic kidney disease (CKD), stage III (moderate) ([place]) 11/19/2014  . Long-term insulin use (Stacey Pierce) 11/19/2014  . Adult hypothyroidism 02/17/2014    Past Surgical History:  Procedure Laterality Date  . CATARACT EXTRACTION     left 2010  . CATARACT EXTRACTION W/PHACO Right 10/13/2015   Procedure: CATARACT EXTRACTION PHACO AND INTRAOCULAR LENS PLACEMENT (IOC);  Surgeon: Stacey Koyanagi, MD;  Location: [facility];  Service: Ophthalmology;  Laterality: Right;  DIABETIC - insulin and oral meds MALYUGIN SHUGARCAINE  . CHOLECYSTECTOMY     1967  . COLONOSCOPY WITH PROPOFOL N/A 05/16/2018   Procedure: COLONOSCOPY WITH PROPOFOL;  Surgeon: Stacey Landsman, MD;  Location: [facility];  Service: Gastroenterology;  Laterality: N/A;  . DILATION AND CURETTAGE OF UTERUS    . ESOPHAGOGASTRODUODENOSCOPY (EGD) WITH PROPOFOL N/A 05/16/2018   Procedure: ESOPHAGOGASTRODUODENOSCOPY (EGD) WITH PROPOFOL;  Surgeon: Stacey Landsman, MD;  Location: [facility];  Service: Gastroenterology;  Laterality: N/A;  . EYE SURGERY    . LAPAROSCOPIC TUBAL LIGATION    . RIGHT/LEFT HEART CATH AND CORONARY ANGIOGRAPHY N/A 08/02/2017   Procedure: RIGHT/LEFT HEART CATH AND CORONARY ANGIOGRAPHY;  Surgeon: Stacey Skains, MD;  Location: [facility];  Service: Cardiovascular;  Laterality: N/A;  . TEE WITHOUT CARDIOVERSION N/A 09/04/2017   Procedure: TRANSESOPHAGEAL ECHOCARDIOGRAM (TEE);  Surgeon: Stacey Mocha, MD;  Location: [facility];  Service: Open Heart Surgery;  Laterality: N/A;  . TONSILLECTOMY     1974  . TRANSCATHETER AORTIC VALVE REPLACEMENT, TRANSFEMORAL N/A 09/04/2017   Procedure: TRANSCATHETER AORTIC VALVE REPLACEMENT, TRANSFEMORAL;  Surgeon: Stacey Mocha, MD;  Location: [place];   Service: Open Heart Surgery;  Laterality: N/A;  . TUBAL LIGATION      Her family history includes Alzheimer's disease in her mother; Breast cancer in her sister; Colon polyps in her mother and sister; Coronary artery disease in her father; Diabetes in her father, sister, sister, sister, and sister; Heart attack in her father; Hypertension in her mother.   Current Outpatient Medications:  .  acetaminophen (TYLENOL) 500 MG tablet, Take 1,000 mg by mouth every 6 (six) hours as needed for mild pain., Disp: , Rfl:  .  amitriptyline (ELAVIL) 10 MG tablet, Take 10 mg by mouth at bedtime., Disp: , Rfl:  .  aspirin EC 81 MG tablet, Take 81 mg by mouth daily., Disp: , Rfl:  .  atorvastatin (LIPITOR) 40 MG tablet, TAKE 1 TABLET (40 MG TOTAL) BY MOUTH DAILY., Disp: 90 tablet, Rfl: 3 .  BD INSULIN SYRINGE U/F 31G X 5/16" 1 ML MISC, 4 (four) times daily., Disp: , Rfl: 99 .  carvedilol (COREG) 25 MG tablet, TAKE 1 TABLET BY MOUTH 2 TIMES DAILY WITH A MEAL., Disp:  180 tablet, Rfl: 3 .  clotrimazole (LOTRIMIN) 1 % cream, Apply 1 application topically 2 (two) times daily., Disp: 30 g, Rfl: 0 .  DiphenhydrAMINE HCl ([employer] ALLERGY PO), Take 1 tablet by mouth daily., Disp: , Rfl:  .  dorzolamide-timolol (COSOPT) 22.3-6.8 MG/ML ophthalmic solution, Place 1 drop into both eyes 2 (two) times daily., Disp: , Rfl: 5 .  EUTHYROX 100 MCG tablet, TAKE 1 TABLET BY MOUTH ONCE DAILY IN THE MORNING ON AN EMPTY STOMACH, Disp: 90 tablet, Rfl: 0 .  gabapentin (NEURONTIN) 300 MG capsule, Take 1 capsule by mouth., Disp: , Rfl:  .  glucose blood test strip, Use three test strips daily as instructed., Disp: , Rfl:  .  insulin aspart (NOVOLOG) 100 UNIT/ML injection, Inject 20-25 Units into the skin 2 (two) times daily before a meal. Takes 20 units before breakfast and 25 units before dinner., Disp: , Rfl:  .  insulin detemir (LEVEMIR) 100 UNIT/ML injection, Inject 20 units into the skin in the morning and 55 units at bedtime, Disp: ,  Rfl:  .  latanoprost (XALATAN) 0.005 % ophthalmic solution, Place 1 drop into both eyes at bedtime. , Disp: , Rfl:  .  losartan (COZAAR) 100 MG tablet, TAKE 1 TABLET BY MOUTH EVERY DAY, Disp: 90 tablet, Rfl: 3 .  metFORMIN (GLUCOPHAGE) 1000 MG tablet, Take 2,000 mg by mouth daily after supper., Disp: , Rfl:  .  Olopatadine HCl (PATADAY OP), Apply 1 drop to eye 2 (two) times daily. In left eye, Disp: , Rfl:  .  pantoprazole (PROTONIX) 40 MG tablet, TAKE 1 TABLET (40 MG TOTAL) BY MOUTH DAILY AT 12 NOON., Disp: 90 tablet, Rfl: 3  Patient Care Team: Jerrol Banana., MD as PCP - General (Family Medicine) Stacey Skains, MD as Consulting Physician (Internal Medicine) Stacey Koyanagi, MD as Referring Physician (Ophthalmology) Stacey Pierce, Betsey Holiday, MD as Physician Assistant (Endocrinology) Benjaman Kindler, MD as Consulting Physician (Obstetrics and Gynecology) Lavonia Dana, MD as Consulting Physician (Internal Medicine) Ralene Bathe, MD (Dermatology)    Objective:    Vitals: BP 130/60   Pulse 80   Temp 98.4 F (36.9 C)   Resp 16   Ht 5\' 6"  (1.676 m)   Wt 193 lb (87.5 kg)   SpO2 98%   BMI 31.15 kg/m   Physical Exam Vitals signs reviewed.  Constitutional:      Appearance: She is well-developed.  HENT:     Head: Normocephalic and atraumatic.     Right Ear: External ear normal.     Left Ear: External ear normal.     Nose: Nose normal.  Eyes:     Conjunctiva/sclera: Conjunctivae normal.     Pupils: Pupils are equal, round, and reactive to light.  Neck:     Musculoskeletal: Normal range of motion and neck supple.  Cardiovascular:     Rate and Rhythm: Normal rate and regular rhythm.     Heart sounds: Normal heart sounds.  Pulmonary:     Effort: Pulmonary effort is normal.     Breath sounds: Normal breath sounds.  Abdominal:     General: Bowel sounds are normal.     Palpations: Abdomen is soft.  Skin:    General: Skin is warm and dry.  Neurological:     Mental  Status: She is alert and oriented to person, place, and time.  Psychiatric:        Behavior: Behavior normal.        Thought Content: Thought  content normal.        Judgment: Judgment normal.     Activities of Daily Living In your present state of health, do you have any difficulty performing the following activities: 07/23/2019  Hearing? N  Vision? N  Difficulty concentrating or making decisions? N  Walking or climbing stairs? Y  Comment Due to back and hip pain.  Dressing or bathing? N  Doing errands, shopping? N  Preparing Food and eating ? N  Using the Toilet? N  In the past six months, have you accidently leaked urine? Y  Comment Occasionally with urges. Was told bladder needed to be tacked.  Do you have problems with loss of bowel control? N  Managing your Medications? N  Managing your Finances? N  Housekeeping or managing your Housekeeping? N  Some recent data might be hidden    Fall Risk Assessment Fall Risk  07/23/2019 07/18/2018 04/24/2018 05/17/2017 11/02/2016  Falls in the past year? 0 Yes No No Yes  Comment - - - - -  Number falls in past yr: - 1 - - 1  Injury with Fall? - No - - No  Comment - - - - -  Risk Factor Category  - - - - -  Risk for fall due to : - - - - -  Follow up - Falls prevention discussed - - -     Depression Screen PHQ 2/9 Scores 07/23/2019 07/18/2018 05/17/2017 05/17/2017  PHQ - 2 Score 0 0 0 0  PHQ- 9 Score - 0 3 -    6CIT Screen 07/23/2019  What Year? 0 points  What month? 0 points  What time? 0 points  Count back from 20 0 points  Months in reverse 0 points  Repeat phrase 0 points  Total Score 0      Assessment & Plan:     Annual Wellness Visit  Reviewed patient's Family Medical History Reviewed and updated list of patient's medical providers Assessment of cognitive impairment was done Assessed patient's functional ability Established a written schedule for health screening [place] Completed and  Reviewed  Exercise Activities and Dietary recommendations Goals    . DIET - REDUCE SUGAR INTAKE     Recommend decreasing amount of sugar and sweets intake to minimal if not none.     . Increase water intake     Recommend increasing water intake to 3 glasses a day.        Immunization History  Administered Date(s) Administered  . Influenza, High Dose Seasonal PF 08/26/2015, 11/02/2016, 09/05/2017, 09/25/2018  . Pneumococcal Conjugate-13 02/25/2015  . Pneumococcal Polysaccharide-23 10/06/2013    Health Maintenance  Topic Date Due  . DEXA SCAN  08/30/2004  . INFLUENZA VACCINE  07/05/2019  . TETANUS/TDAP  12/04/2026 (Originally 12/11/1959)  . HEMOGLOBIN A1C  10/19/2019  . OPHTHALMOLOGY EXAM  01/14/2020  . FOOT EXAM  04/17/2020  . PNA vac Low Risk Adult  Completed     Discussed health benefits of physical activity, and encouraged her to engage in regular exercise appropriate for her age and condition.  1. Annual physical exam Screening UTD. Needs tetanus.  2. Type 2 diabetes mellitus with diabetic nephropathy, unspecified whether long term insulin use (HCC)  - POCT UA - Microalbumin  3. Benign essential HTN  - Comprehensive metabolic panel  4. Acquired hypothyroidism  - TSH  5. Hypercholesterolemia  - CBC with Differential/Platelet - Lipid panel 7.Low Back Pain PT next week.   Stacey Durie, MD  [facility]

## 2019-07-27 ENCOUNTER — Other Ambulatory Visit: Payer: Self-pay | Admitting: Family Medicine

## 2019-07-29 ENCOUNTER — Other Ambulatory Visit: Payer: Self-pay

## 2019-07-29 ENCOUNTER — Ambulatory Visit: Payer: Medicare HMO | Attending: Family Medicine

## 2019-07-29 DIAGNOSIS — I10 Essential (primary) hypertension: Secondary | ICD-10-CM | POA: Diagnosis not present

## 2019-07-29 DIAGNOSIS — M25551 Pain in right hip: Secondary | ICD-10-CM | POA: Diagnosis not present

## 2019-07-29 DIAGNOSIS — M545 Low back pain, unspecified: Secondary | ICD-10-CM

## 2019-07-29 DIAGNOSIS — R2689 Other abnormalities of gait and mobility: Secondary | ICD-10-CM | POA: Diagnosis not present

## 2019-07-29 DIAGNOSIS — G8929 Other chronic pain: Secondary | ICD-10-CM | POA: Insufficient documentation

## 2019-07-29 DIAGNOSIS — E039 Hypothyroidism, unspecified: Secondary | ICD-10-CM | POA: Diagnosis not present

## 2019-07-29 DIAGNOSIS — M6281 Muscle weakness (generalized): Secondary | ICD-10-CM | POA: Diagnosis not present

## 2019-07-29 DIAGNOSIS — E7800 Pure hypercholesterolemia, unspecified: Secondary | ICD-10-CM | POA: Diagnosis not present

## 2019-07-29 NOTE — Therapy (Signed)
[place] [facility] [address], [place], [address] Phone: [phone]   Fax:  [phone]  Physical Therapy Evaluation  Patient Details  Name: Stacey Pierce MRN: [id number] Date of Birth: 1941-12-04 Referring Provider (PT): Miguel Aschoff MD   Encounter Date: 07/29/2019  PT End of Session - 07/29/19 1800    Visit Number  1    Number of Visits  13    Date for PT Re-Evaluation  09/09/19    PT Start Time  1630    PT Stop Time  1730    PT Time Calculation (min)  60 min    Activity Tolerance  Patient tolerated treatment well;No increased pain    Behavior During Therapy  [facility] for tasks assessed/performed       Past Medical History:  Diagnosis Date   Anemia    Aortic stenosis, severe    a. 09/2017: s/p TAVR   CKD (chronic kidney disease)    Diabetes mellitus, insulin dependent (IDDM), uncontrolled ([place])    DR (diabetic retinopathy) ([place])    GERD (gastroesophageal reflux disease)    Glaucoma    Hyperlipidemia    Hypertension    Hypothyroidism    Obesity    S/P TAVR (transcatheter aortic valve replacement) 09/04/2017   26 mm Medtronic Evolut Pro transcatheter heart valve placed via percutaneous right transfemoral approach     Past Surgical History:  Procedure Laterality Date   CATARACT EXTRACTION     left 2010   CATARACT EXTRACTION W/PHACO Right 10/13/2015   Procedure: CATARACT EXTRACTION PHACO AND INTRAOCULAR LENS PLACEMENT ([place]);  Surgeon: Leandrew Koyanagi, MD;  Location: [facility];  Service: Ophthalmology;  Laterality: Right;  DIABETIC - insulin and oral meds [address]   COLONOSCOPY WITH PROPOFOL N/A 05/16/2018   Procedure: COLONOSCOPY WITH PROPOFOL;  Surgeon: Lin Landsman, MD;  Location: [facility] ENDOSCOPY;  Service: Gastroenterology;  Laterality: N/A;   DILATION AND CURETTAGE OF UTERUS     ESOPHAGOGASTRODUODENOSCOPY (EGD) WITH  PROPOFOL N/A 05/16/2018   Procedure: ESOPHAGOGASTRODUODENOSCOPY (EGD) WITH PROPOFOL;  Surgeon: Lin Landsman, MD;  Location: [facility] ENDOSCOPY;  Service: Gastroenterology;  Laterality: N/A;   EYE SURGERY     LAPAROSCOPIC TUBAL LIGATION     RIGHT/LEFT HEART CATH AND CORONARY ANGIOGRAPHY N/A 08/02/2017   Procedure: RIGHT/LEFT HEART CATH AND CORONARY ANGIOGRAPHY;  Surgeon: Corey Skains, MD;  Location: [place] CV LAB;  Service: Cardiovascular;  Laterality: N/A;   TEE WITHOUT CARDIOVERSION N/A 09/04/2017   Procedure: TRANSESOPHAGEAL ECHOCARDIOGRAM (TEE);  Surgeon: Sherren Mocha, MD;  Location: [place];  Service: Open Heart Surgery;  Laterality: N/A;   TONSILLECTOMY     1974   TRANSCATHETER AORTIC VALVE REPLACEMENT, TRANSFEMORAL N/A 09/04/2017   Procedure: TRANSCATHETER AORTIC VALVE REPLACEMENT, TRANSFEMORAL;  Surgeon: Sherren Mocha, MD;  Location: [place];  Service: Open Heart Surgery;  Laterality: N/A;   TUBAL LIGATION      There were no vitals filed for this visit.   Subjective Assessment - 07/29/19 1754    Subjective  Pt is a 78 y/o F who presents to PT with a primary medical dx of LBP without sciatica and with R hip pain. Pt reports initial onset of LBP and R hip pain 1-2 years ago. Pt reports a current 0/10 LBP and 2/10 R hip pain, which has worsened since her most recent R hip steroidal injection 2 weeks ago. Pt reports that her pain worsens to  8-10/10 with lifting heavier objects (over 15#), going downstairs, and prolonged positions/sustained activities. Pt reports that she takes Tylenol prior to bedtime for pain management. Pt currently works for [employer] and notes that he lifts boxes ~15# frequently, as well as standing for up to 7-8 hours at a time. Pt has a primary PT goal of returning to prior level of function and reducing her pain.    Pertinent History  DM, aortic stenosis, CKD, HTN    Limitations  Lifting    Patient Stated Goals  Return to prior level of function,  reduce pain    Currently in Pain?  Yes    Pain Score  2     Pain Location  Hip    Pain Orientation  Right;Posterior;Proximal    Pain Descriptors / Indicators  Aching;Constant    Pain Type  Chronic pain    Pain Onset  More than a month ago    Pain Frequency  Constant    Aggravating Factors   Prolonged activity    Pain Relieving Factors  Tylenol    Effect of Pain on Daily Activities  Reduction of activity    Multiple Pain Sites  Yes    Pain Score  0    Pain Location  Back    Pain Orientation  Distal    Pain Descriptors / Indicators  Aching    Pain Onset  More than a month ago         [facility] PT Assessment - 07/29/19 1816      Assessment   Medical Diagnosis  LBP without sciatica and R hip pain    Referring Provider (PT)  Miguel Aschoff MD    Prior Therapy  Yes      Precautions   Precautions  None      Restrictions   Weight Bearing Restrictions  No      Balance Screen   Has the patient fallen in the past 6 months  No    Has the patient had a decrease in activity level because of a fear of falling?   Yes    Is the patient reluctant to leave their home because of a fear of falling?   No      Home Film/video editor residence    Home Access  Stairs to enter    Entrance Stairs-Number of Steps  2    Entrance Stairs-Rails  Left      Prior Function   Level of Independence  Independent    Vocation  Full time employment    Cytogeneticist boxes, prolonged standing    Leisure  Playing cards, bowling      Cognition   Overall Cognitive Status  Within Functional Limits for tasks assessed      Observation/Other Assessments   Other Surveys   Modified Oswestry    Modified Oswestry  8/100      Functional Tests   Functional tests  Squat;Single leg stance;Sit to [place] anterior translation, inc lumbar lordosis      Single Leg Stance   Comments  L 7s, R 5s      Sit to Stand   Comments  Inc time, push off B  thighs      Posture/Postural Control   Posture/Postural Control  No significant limitations      ROM / Strength   AROM / PROM / Strength  Strength;AROM  AROM   Overall AROM Comments  Dec B hip ext, lumbar ext, thoracic B rotation (R>L)      Strength   Strength Assessment Site  Hip;Knee;Ankle    Right/Left Hip  Right;Left    Right Hip Flexion  3+/5    Right Hip Extension  3/5    Right Hip External Rotation   4+/5    Right Hip Internal Rotation  4+/5    Right Hip ABduction  3+/5    Right Hip ADduction  3+/5    Left Hip Flexion  3+/5    Left Hip Extension  4/5    Left Hip External Rotation  5/5    Left Hip Internal Rotation  4/5    Left Hip ABduction  4/5    Left Hip ADduction  3+/5    Right/Left Knee  Right;Left    Right Knee Extension  5/5    Left Knee Extension  3+/5    Right/Left Ankle  Right;Left    Right Ankle Dorsiflexion  5/5    Right Ankle Plantar Flexion  5/5    Left Ankle Dorsiflexion  5/5    Left Ankle Plantar Flexion  5/5      Palpation   Palpation comment  WNL      Transfers   Five time sit to stand comments   21.56, push off B thighs      Ambulation/Gait   Ambulation/Gait  Yes    Assistive device  None    Gait Pattern  Decreased stride length;Poor foot clearance - left;Poor foot clearance - right    Ambulation Surface  Level    Stairs  Yes    Stair Management Technique  One rail Left;Step to pattern;Forwards;Alternating pattern   reciprocal forward ascent, step to forward descent   Number of Stairs  4    Height of Stairs  7      Balance   Balance Assessed  Yes      Static Standing Balance   Static Standing - Balance Support  No upper extremity supported    Static Standing Balance -  Activities   Single Leg Stance - Right Leg;Single Leg Stance - Left Leg    Static Standing - Comment/# of Minutes  L 7s, R 5s      Standardized Balance Assessment   Standardized Balance Assessment  Five Times Sit to Stand    Five times sit to stand comments    21.56 s      Objective measurements completed on examination: See above findings.    TREATMENT  Therapeutic Exercise to improve lumbar/hip mobility, strength, and activity tolerance.  Prone on elbows x 5 min Standing lumbar ext with straight arms 2x10  Pt performed exercises without sx exacerbation.     PT Education - 07/29/19 1759    Education Details  Pt educated on plan of care and prognosis. Pt educated on technique/form, as well as provided/prescribed HEP of prone on elbows x3-5 min and standing lumbar ext with ext arms 2x10.    Person(s) Educated  Patient    Methods  Explanation;Demonstration;Tactile cues;Verbal cues    Comprehension  Verbalized understanding;Returned demonstration       PT Short Term Goals - 07/29/19 1814      PT SHORT TERM GOAL #1   Title  Pt will be compliant and independent with her HEP.    Time  2    Period  Weeks    Status  New    Target Date  08/12/19  PT Long Term Goals - 07/29/19 1811      PT LONG TERM GOAL #1   Title  Pt will demonstrate a 0/100 on the Modified Oswestry to indicate a significantly improved disability and function.    Baseline  8/100    Time  6    Period  Weeks    Status  New    Target Date  09/09/19      PT LONG TERM GOAL #2   Title  Pt will demonstrate an at worst 6/10 pain in the lower back and R hip to demonstrate a significantly improved pain response.    Baseline  8-10/10 pain at worst    Time  6    Period  Weeks    Status  New    Target Date  09/09/19      PT LONG TERM GOAL #3   Title  Pt will perform 5x STS in equal to or less than 13 seconds to indicate a significantly improved fall risk and lower body strength to perform her ADLs with decreased difficulty.    Baseline  21.56 seconds    Time  6    Period  Weeks    Status  New    Target Date  09/09/19      PT LONG TERM GOAL #4   Title  Pt will perform 20# box lift off the floor with the appropriate hip hinge/squat technique without need for  cueing or increase in sx.    Baseline  Able to perform 15# box lift with extensive cueing required.    Time  6    Period  Weeks    Status  New    Target Date  09/09/19             Plan - 07/29/19 1801    Clinical Impression Statement  Pt is a 78 y/o F who presents to PT with a primary PT dx of LBP and R hip pain, as well as secondary diagnoses of impaired lumbar/hip strength and impaired lumbar/hip mobility. Pt presents with the following hip static strength measurements via MMT: L hip flex 3+/5, L hip IR 4/5, L hip ABD 4/5, L hip ADD 3+/5, L hip ext 4/5, R hip flex 3+/5, R hip ER/IR 4+/5, R hip ABD/ADD 3+/5, and R hip ext 3/5. Pt also demonstrates decreased standing lumbar ext, seated thoracic R rotation, and R lumbar side-bending with concordant sx. Pt performed 5x STS in 21.56 sec, which demonstrates increased fall risk and decreased B LE strength. Pt also demonstrated increased difficulty with stairs descent, with step-to pattern (reciprocal ascent). Pt performed SLS L 7s and R 5s, indicating impaired static postural stability and SL strength. Pt did perform a squat with increased anterior weight translation, although she was able to lift a 5-15# box off the ground without sx exacerbation. With cueing and practice, pt able to perform box lift with appropriate hip hinge/squat with good technique. Pt demonstrates impaired lumbar/hip mobility and strength, static/dynamic postural stability, as well as increased pain with prolonged activity indicating impaired activity tolerance. Pt will benefit from skilled therapy treatment in order to return to prior level of function.    Personal Factors and Comorbidities  Age;Comorbidity 1;Comorbidity 2;Comorbidity 3+;Fitness    Comorbidities  Aortic stenosis, CKD, DM, HTN    Examination-Activity Limitations  Lift;Stairs;Squat;Locomotion Level    Examination-Participation Restrictions  Community Activity    Stability/Clinical Decision Making   Stable/Uncomplicated    Clinical Decision Making  Low  Rehab Potential  Good    PT Frequency  2x / week    PT Duration  6 weeks    PT Treatment/Interventions  ADLs/Self Care Home Management;Cryotherapy;Electrical Stimulation;Moist Heat;Gait training;Stair training;Functional mobility training;Therapeutic activities;Therapeutic exercise;Balance training;Patient/family education;Neuromuscular re-education;Manual techniques;Passive range of motion;Dry needling;Joint Manipulations    PT Next Visit Plan  Progress HEP and strengthening/mobility program.    PT Home Exercise Plan  See education section.    Consulted and Agree with Plan of Care  Patient       Patient will benefit from skilled therapeutic intervention in order to improve the following deficits and impairments:  Abnormal gait, Decreased range of motion, Difficulty walking, Increased muscle spasms, Decreased endurance, Decreased activity tolerance, Pain, Improper body mechanics, Impaired flexibility, Hypomobility, Decreased balance, Decreased mobility, Decreased strength, Postural dysfunction  Visit Diagnosis: Chronic low back pain without sciatica, unspecified back pain laterality  Pain in right hip  Muscle weakness (generalized)  Other abnormalities of gait and mobility     Problem List Patient Active Problem List   Diagnosis Date Noted   Venous insufficiency of both lower extremities 06/13/2018   Iron deficiency anemia due to chronic blood loss 04/10/2018   B12 deficiency anemia 03/13/2018   Dizziness 01/10/2018   Obesity    S/P TAVR (transcatheter aortic valve replacement) 09/04/2017   Aortic stenosis, severe 07/30/2017   Allergic rhinitis 08/26/2015   Airway hyperreactivity 08/26/2015   Back pain, chronic 08/26/2015   Diabetes ([place]) 08/26/2015   Degeneration of lumbar or lumbosacral intervertebral disc 08/26/2015   Acid reflux 08/26/2015   Glaucoma 08/26/2015   HLD (hyperlipidemia) 08/26/2015    Adiposity 08/26/2015   Benign essential HTN 04/29/2015   Arteriosclerosis of coronary artery 01/26/2015   Retinopathy 11/19/2014   Chronic kidney disease (CKD), stage III (moderate) ([place]) 11/19/2014   Long-term insulin use ([place]) 11/19/2014   Adult hypothyroidism 02/17/2014    Scarlette Calico, SPT 07/29/2019, 6:24 PM  [place] [facility] [address], [place], [address] Phone: [phone]   Fax:  [phone]  Name: Stacey Pierce MRN: [id number] Date of Birth: Apr 02, 1941

## 2019-07-30 DIAGNOSIS — N183 Chronic kidney disease, stage 3 (moderate): Secondary | ICD-10-CM | POA: Diagnosis not present

## 2019-07-30 DIAGNOSIS — D631 Anemia in chronic kidney disease: Secondary | ICD-10-CM | POA: Diagnosis not present

## 2019-07-30 DIAGNOSIS — E1122 Type 2 diabetes mellitus with diabetic chronic kidney disease: Secondary | ICD-10-CM | POA: Diagnosis not present

## 2019-07-30 DIAGNOSIS — I129 Hypertensive chronic kidney disease with stage 1 through stage 4 chronic kidney disease, or unspecified chronic kidney disease: Secondary | ICD-10-CM | POA: Diagnosis not present

## 2019-07-30 DIAGNOSIS — I10 Essential (primary) hypertension: Secondary | ICD-10-CM | POA: Diagnosis not present

## 2019-07-30 DIAGNOSIS — N2581 Secondary hyperparathyroidism of renal origin: Secondary | ICD-10-CM | POA: Diagnosis not present

## 2019-07-30 LAB — CBC WITH DIFFERENTIAL/PLATELET
Basophils Absolute: 0.1 10*3/uL (ref 0.0–0.2)
Basos: 1 %
EOS (ABSOLUTE): 0.3 10*3/uL (ref 0.0–0.4)
Eos: 6 %
Hematocrit: 34.7 % (ref 34.0–46.6)
Hemoglobin: 11.4 g/dL (ref 11.1–15.9)
Immature Grans (Abs): 0 10*3/uL (ref 0.0–0.1)
Immature Granulocytes: 0 %
Lymphocytes Absolute: 1.7 10*3/uL (ref 0.7–3.1)
Lymphs: 28 %
MCH: 28.9 pg (ref 26.6–33.0)
MCHC: 32.9 g/dL (ref 31.5–35.7)
MCV: 88 fL (ref 79–97)
Monocytes Absolute: 0.7 10*3/uL (ref 0.1–0.9)
Monocytes: 11 %
Neutrophils Absolute: 3.3 10*3/uL (ref 1.4–7.0)
Neutrophils: 54 %
Platelets: 219 10*3/uL (ref 150–450)
RBC: 3.95 x10E6/uL (ref 3.77–5.28)
RDW: 11.8 % (ref 11.7–15.4)
WBC: 6 10*3/uL (ref 3.4–10.8)

## 2019-07-30 LAB — COMPREHENSIVE METABOLIC PANEL
ALT: 16 IU/L (ref 0–32)
AST: 14 IU/L (ref 0–40)
Albumin/Globulin Ratio: 2 (ref 1.2–2.2)
Albumin: 4.3 g/dL (ref 3.7–4.7)
Alkaline Phosphatase: 68 IU/L (ref 39–117)
BUN/Creatinine Ratio: 13 (ref 12–28)
BUN: 16 mg/dL (ref 8–27)
Bilirubin Total: 0.3 mg/dL (ref 0.0–1.2)
CO2: 19 mmol/L — ABNORMAL LOW (ref 20–29)
Calcium: 9.4 mg/dL (ref 8.7–10.3)
Chloride: 106 mmol/L (ref 96–106)
Creatinine, Ser: 1.28 mg/dL — ABNORMAL HIGH (ref 0.57–1.00)
GFR calc Af Amer: 46 mL/min/{1.73_m2} — ABNORMAL LOW (ref >59)
GFR calc non Af Amer: 40 mL/min/{1.73_m2} — ABNORMAL LOW (ref >59)
Globulin, Total: 2.2 g/dL (ref 1.5–4.5)
Glucose: 177 mg/dL — ABNORMAL HIGH (ref 65–99)
Potassium: 4.9 mmol/L (ref 3.5–5.2)
Sodium: 140 mmol/L (ref 134–144)
Total Protein: 6.5 g/dL (ref 6.0–8.5)

## 2019-07-30 LAB — LIPID PANEL
Chol/HDL Ratio: 3.2 ratio (ref 0.0–4.4)
Cholesterol, Total: 152 mg/dL (ref 100–199)
HDL: 48 mg/dL (ref >39)
LDL Calculated: 71 mg/dL (ref 0–99)
Triglycerides: 163 mg/dL — ABNORMAL HIGH (ref 0–149)
VLDL Cholesterol Cal: 33 mg/dL (ref 5–40)

## 2019-07-30 LAB — TSH: TSH: 6.79 u[IU]/mL — ABNORMAL HIGH (ref 0.450–4.500)

## 2019-07-31 ENCOUNTER — Ambulatory Visit: Payer: Medicare HMO

## 2019-07-31 DIAGNOSIS — L821 Other seborrheic keratosis: Secondary | ICD-10-CM | POA: Diagnosis not present

## 2019-07-31 DIAGNOSIS — L578 Other skin changes due to chronic exposure to nonionizing radiation: Secondary | ICD-10-CM | POA: Diagnosis not present

## 2019-07-31 DIAGNOSIS — L820 Inflamed seborrheic keratosis: Secondary | ICD-10-CM | POA: Diagnosis not present

## 2019-08-01 DIAGNOSIS — I872 Venous insufficiency (chronic) (peripheral): Secondary | ICD-10-CM | POA: Diagnosis not present

## 2019-08-01 DIAGNOSIS — I350 Nonrheumatic aortic (valve) stenosis: Secondary | ICD-10-CM | POA: Diagnosis not present

## 2019-08-01 DIAGNOSIS — I10 Essential (primary) hypertension: Secondary | ICD-10-CM | POA: Diagnosis not present

## 2019-08-01 DIAGNOSIS — I2510 Atherosclerotic heart disease of native coronary artery without angina pectoris: Secondary | ICD-10-CM | POA: Diagnosis not present

## 2019-08-05 ENCOUNTER — Telehealth: Payer: Self-pay

## 2019-08-05 ENCOUNTER — Ambulatory Visit: Payer: Medicare HMO

## 2019-08-05 DIAGNOSIS — E039 Hypothyroidism, unspecified: Secondary | ICD-10-CM

## 2019-08-05 LAB — POCT UA - MICROALBUMIN: Microalbumin Ur, POC: 20 mg/L

## 2019-08-05 NOTE — Telephone Encounter (Signed)
-----   Message from Jerrol Banana., MD sent at 07/30/2019  4:16 PM EDT ----- Stable--increase synthroid from 100 to 112 mcg daily

## 2019-08-05 NOTE — Telephone Encounter (Signed)
LMTCB

## 2019-08-06 MED ORDER — LEVOTHYROXINE SODIUM 112 MCG PO TABS: 112.0000 ug | ORAL_TABLET | Freq: Every day | ORAL | 3 refills | Status: DC

## 2019-08-06 NOTE — Telephone Encounter (Signed)
Advised patient of results. Medication was sent into the pharmacy.  

## 2019-08-07 ENCOUNTER — Ambulatory Visit: Payer: Medicare HMO | Attending: Family Medicine

## 2019-08-12 ENCOUNTER — Telehealth: Payer: Self-pay

## 2019-08-12 ENCOUNTER — Ambulatory Visit: Payer: Medicare HMO

## 2019-08-12 NOTE — Telephone Encounter (Signed)
No show. Called patient pertaining to her appointment today. Pt states that her husband is having surgery tomorrow at [facility]. Just got back from an appointment in [place] today for her husband's surgery. Will have to cancel her 08/14/19. 08/19/19, and 08/21/19 appointments. Will see if she can make it to the 08/26/2019 session. She will let us know. Pt states that her back is doing fine.

## 2019-08-14 ENCOUNTER — Ambulatory Visit: Payer: Medicare HMO

## 2019-08-14 DIAGNOSIS — D631 Anemia in chronic kidney disease: Secondary | ICD-10-CM | POA: Diagnosis not present

## 2019-08-14 DIAGNOSIS — N183 Chronic kidney disease, stage 3 (moderate): Secondary | ICD-10-CM | POA: Diagnosis not present

## 2019-08-14 DIAGNOSIS — E1122 Type 2 diabetes mellitus with diabetic chronic kidney disease: Secondary | ICD-10-CM | POA: Diagnosis not present

## 2019-08-14 DIAGNOSIS — N2581 Secondary hyperparathyroidism of renal origin: Secondary | ICD-10-CM | POA: Diagnosis not present

## 2019-08-14 DIAGNOSIS — I10 Essential (primary) hypertension: Secondary | ICD-10-CM | POA: Diagnosis not present

## 2019-08-19 ENCOUNTER — Ambulatory Visit: Payer: Medicare HMO

## 2019-08-20 DIAGNOSIS — R69 Illness, unspecified: Secondary | ICD-10-CM | POA: Diagnosis not present

## 2019-08-22 DIAGNOSIS — Z794 Long term (current) use of insulin: Secondary | ICD-10-CM | POA: Diagnosis not present

## 2019-08-22 DIAGNOSIS — E11319 Type 2 diabetes mellitus with unspecified diabetic retinopathy without macular edema: Secondary | ICD-10-CM | POA: Diagnosis not present

## 2019-08-22 LAB — BASIC METABOLIC PANEL
BUN: 24 — AB (ref 4–21)
CO2: 23 — AB (ref 13–22)
Chloride: 108 (ref 99–108)
Creatinine: 1.3 — AB (ref 0.5–1.1)
Potassium: 5 (ref 3.4–5.3)
Sodium: 138 (ref 137–147)

## 2019-08-22 LAB — HEMOGLOBIN A1C: Hemoglobin A1C: 7.1

## 2019-08-24 ENCOUNTER — Other Ambulatory Visit: Payer: Self-pay | Admitting: Family Medicine

## 2019-08-25 ENCOUNTER — Ambulatory Visit
Admission: RE | Admit: 2019-08-25 | Discharge: 2019-08-25 | Disposition: A | Discharge status: Home / Self Care | Payer: Medicare HMO | Source: Ambulatory Visit | Attending: Family Medicine | Admitting: Family Medicine

## 2019-08-25 DIAGNOSIS — Z780 Asymptomatic menopausal state: Secondary | ICD-10-CM | POA: Diagnosis not present

## 2019-08-25 DIAGNOSIS — E119 Type 2 diabetes mellitus without complications: Secondary | ICD-10-CM | POA: Diagnosis not present

## 2019-08-25 DIAGNOSIS — M85832 Other specified disorders of bone density and structure, left forearm: Secondary | ICD-10-CM | POA: Diagnosis not present

## 2019-08-25 DIAGNOSIS — Z794 Long term (current) use of insulin: Secondary | ICD-10-CM | POA: Insufficient documentation

## 2019-08-25 DIAGNOSIS — M8580 Other specified disorders of bone density and structure, unspecified site: Secondary | ICD-10-CM | POA: Diagnosis not present

## 2019-08-25 DIAGNOSIS — E039 Hypothyroidism, unspecified: Secondary | ICD-10-CM | POA: Insufficient documentation

## 2019-08-25 DIAGNOSIS — Z13820 Encounter for screening for osteoporosis: Secondary | ICD-10-CM | POA: Diagnosis not present

## 2019-08-26 ENCOUNTER — Ambulatory Visit: Payer: Medicare HMO

## 2019-08-28 ENCOUNTER — Ambulatory Visit: Payer: Medicare HMO

## 2019-08-29 DIAGNOSIS — E1129 Type 2 diabetes mellitus with other diabetic kidney complication: Secondary | ICD-10-CM | POA: Diagnosis not present

## 2019-08-29 DIAGNOSIS — Z794 Long term (current) use of insulin: Secondary | ICD-10-CM | POA: Diagnosis not present

## 2019-08-29 DIAGNOSIS — E1142 Type 2 diabetes mellitus with diabetic polyneuropathy: Secondary | ICD-10-CM | POA: Diagnosis not present

## 2019-08-29 DIAGNOSIS — E11319 Type 2 diabetes mellitus with unspecified diabetic retinopathy without macular edema: Secondary | ICD-10-CM | POA: Diagnosis not present

## 2019-08-29 DIAGNOSIS — N183 Chronic kidney disease, stage 3 (moderate): Secondary | ICD-10-CM | POA: Diagnosis not present

## 2019-08-29 DIAGNOSIS — R809 Proteinuria, unspecified: Secondary | ICD-10-CM | POA: Diagnosis not present

## 2019-08-29 DIAGNOSIS — E1122 Type 2 diabetes mellitus with diabetic chronic kidney disease: Secondary | ICD-10-CM | POA: Diagnosis not present

## 2019-09-02 ENCOUNTER — Ambulatory Visit: Payer: Medicare HMO

## 2019-09-10 ENCOUNTER — Ambulatory Visit (INDEPENDENT_AMBULATORY_CARE_PROVIDER_SITE_OTHER): Payer: Medicare HMO

## 2019-09-10 DIAGNOSIS — Z23 Encounter for immunization: Secondary | ICD-10-CM

## 2019-09-18 DIAGNOSIS — H401131 Primary open-angle glaucoma, bilateral, mild stage: Secondary | ICD-10-CM | POA: Diagnosis not present

## 2019-09-30 DIAGNOSIS — R69 Illness, unspecified: Secondary | ICD-10-CM | POA: Diagnosis not present

## 2019-10-18 DIAGNOSIS — R69 Illness, unspecified: Secondary | ICD-10-CM | POA: Diagnosis not present

## 2019-11-18 ENCOUNTER — Other Ambulatory Visit: Payer: Self-pay | Admitting: Physician Assistant

## 2019-11-18 DIAGNOSIS — R69 Illness, unspecified: Secondary | ICD-10-CM | POA: Diagnosis not present

## 2019-11-18 DIAGNOSIS — I10 Essential (primary) hypertension: Secondary | ICD-10-CM

## 2019-12-18 DIAGNOSIS — R69 Illness, unspecified: Secondary | ICD-10-CM | POA: Diagnosis not present

## 2019-12-23 ENCOUNTER — Other Ambulatory Visit: Payer: Self-pay | Admitting: Physician Assistant

## 2019-12-23 DIAGNOSIS — R69 Illness, unspecified: Secondary | ICD-10-CM | POA: Diagnosis not present

## 2020-01-16 DIAGNOSIS — R69 Illness, unspecified: Secondary | ICD-10-CM | POA: Diagnosis not present

## 2020-01-22 ENCOUNTER — Ambulatory Visit: Payer: Self-pay | Admitting: Family Medicine

## 2020-01-28 NOTE — Progress Notes (Signed)
Patient: Stacey Pierce Female    DOB: 1941/02/18   79 y.o.   MRN: [id number] Visit Date: 01/29/2020  Today's Provider: Wilhemena Durie, MD   Chief Complaint  Patient presents with  . Diabetes  . Hypertension  . Hypothyroidism  . Hyperlipidemia   Subjective:    I [place] S. Dimas, CMA, am acting as scribe for Wilhemena Durie, MD.  HPI  The only new complaint the patient has today is 1 of hand pain consistent with osteoarthritis.  Her right ring finger is swollen and stiff.  Other fingers are involved but this is the worst.  She does have decreased vision in left eye since cataract surgery.  She has follow-up with ophthalmology for this.  He has chronic low back pain with is stable.  Diabetes Mellitus Type II, Follow-up:   Lab Results  Component Value Date   HGBA1C 7.1 08/22/2019   HGBA1C 7.5 (H) 08/30/2017   HGBA1C 7.5 (H) 11/07/2016    Last seen for diabetes 6 months ago.  Management since then includes no changes. Patient is followed by Dr. Gabriel Carina endocinologist. Patient was last seen at Endo on 08/29/2019. A1c was 7.1%. Patient has follow up for labs with endo on 02/27/2020 and 03/11/2020 with Dr. Gabriel Carina. She reports excellent compliance with treatment. Patient is taking medications for diabetes: Metformin 1000 mg BID, Levemir-20 units am and 53 units qHS, and Novolog-20 units before breakfast and 25 units before supper. She is not having side effects.  Current symptoms include visual disturbances and have been stable. Home blood sugar records: fasting range: 171 this morning  Episodes of hypoglycemia? no   Current insulin regiment: Levemir 20 units AM, 53 units qHS, Novolog 20 units before breakfast and 25 units before supper. Most Recent Eye Exam: 01/13/2019,  Weight trend: stable Prior visit with dietician: No Current exercise: walking Current diet habits: in general, a "healthy" diet    Pertinent Labs:    Component Value Date/Time   CHOL 152  07/29/2019 0950   TRIG 163 (H) 07/29/2019 0950   HDL 48 07/29/2019 0950   LDLCALC 71 07/29/2019 0950   LDLCALC 82 11/19/2017 0952   CREATININE 1.3 (A) 08/22/2019 0000   CREATININE 1.28 (H) 07/29/2019 0950   CREATININE 1.13 (H) 06/08/2015 1442    Wt Readings from Last 3 Encounters:  01/29/20 193 lb 6.4 oz (87.7 kg)  07/24/19 193 lb (87.5 kg)  07/10/19 194 lb (88 kg)    ------------------------------------------------------------------------  Hypertension, follow-up:  BP Readings from Last 3 Encounters:  01/29/20 133/88  07/24/19 130/60  07/10/19 (!) 110/56    She was last seen for hypertension 6 months ago.  BP at that visit was 130/60. Management changes since that visit include no changes. She reports excellent compliance with treatment. She is not having side effects.  She is exercising. She is not adherent to low salt diet.   Outside blood pressures are stable. She is experiencing none.  Patient denies chest pain and lower extremity edema.   Cardiovascular risk factors include advanced age (older than 84 for men, 59 for women), diabetes mellitus, dyslipidemia and hypertension.  Use of agents associated with hypertension: none.     Weight trend: stable Wt Readings from Last 3 Encounters:  01/29/20 193 lb 6.4 oz (87.7 kg)  07/24/19 193 lb (87.5 kg)  07/10/19 194 lb (88 kg)    Current diet: in general, a "healthy" diet    ------------------------------------------------------------------------   Lipid/Cholesterol, Follow-up:  Last seen for this6 months ago.  Management changes since that visit include no changes. Last Lipid Panel:    Component Value Date/Time   CHOL 152 07/29/2019 0950   TRIG 163 (H) 07/29/2019 0950   HDL 48 07/29/2019 0950   CHOLHDL 3.2 07/29/2019 0950   CHOLHDL 3.1 11/19/2017 0952   LDLCALC 71 07/29/2019 0950   LDLCALC 82 11/19/2017 0952    Risk factors for vascular disease include arteriosclerotic heart disease, diabetes  mellitus, hypercholesterolemia and hypertension  She reports excellent compliance with treatment. She is not having side effects.  Current symptoms include none and have been stable. Weight trend: stable Prior visit with dietician: no Current diet: in general, a "healthy" diet   Current exercise: walking  Wt Readings from Last 3 Encounters:  01/29/20 193 lb 6.4 oz (87.7 kg)  07/24/19 193 lb (87.5 kg)  07/10/19 194 lb (88 kg)  -------------------------------------------------------------------  Hypothyroid, follow-up:  TSH  Date Value Ref Range Status  07/29/2019 6.790 (H) 0.450 - 4.500 uIU/mL Final  11/19/2017 3.38 0.40 - 4.50 mIU/L Final  11/07/2016 2.830 0.450 - 4.500 uIU/mL Final   Wt Readings from Last 3 Encounters:  01/29/20 193 lb 6.4 oz (87.7 kg)  07/24/19 193 lb (87.5 kg)  07/10/19 194 lb (88 kg)    She was last seen for hypothyroid 6 months ago.  Management since that visit includes increase Synthroid from 100 to 112 mcg daily. She reports excellent compliance with treatment. She is not having side effects.  She is exercising. She is experiencing heat / cold intolerance She denies none Weight trend: stable  ------------------------------------------------------------------------  Allergies  Allergen Reactions  . Hydrocodone-Homatropine     Nausea/dizziness  . Azithromycin Rash    Other reaction(s): Unknown MYCIN GROUP   . Codeine Other (See Comments)    "COLIC"  . Erythromycin Rash  . Penicillins Rash    Has patient had a PCN reaction causing immediate rash, facial/tongue/throat swelling, SOB or lightheadedness with hypotension: No Has patient had a PCN reaction causing severe rash involving mucus membranes or skin necrosis: No Has patient had a PCN reaction that required hospitalization: No Has patient had a PCN reaction occurring within the last 10 years: No If all of the above answers are "NO", then may proceed with Cephalosporin use.   . Sulfa  Antibiotics Other (See Comments)    "COLIC"     Current Outpatient Medications:  .  acetaminophen (TYLENOL) 500 MG tablet, Take 1,000 mg by mouth every 6 (six) hours as needed for mild pain., Disp: , Rfl:  .  aspirin EC 81 MG tablet, Take 81 mg by mouth daily., Disp: , Rfl:  .  atorvastatin (LIPITOR) 40 MG tablet, TAKE 1 TABLET (40 MG TOTAL) BY MOUTH DAILY., Disp: 90 tablet, Rfl: 3 .  BD INSULIN SYRINGE U/F 31G X 5/16" 1 ML MISC, 4 (four) times daily., Disp: , Rfl: 99 .  carvedilol (COREG) 25 MG tablet, TAKE 1 TABLET BY MOUTH 2 TIMES DAILY WITH A MEAL., Disp: 180 tablet, Rfl: 3 .  DiphenhydrAMINE HCl ([facility] PO), Take 1 tablet by mouth daily., Disp: , Rfl:  .  dorzolamide-timolol (COSOPT) 22.3-6.8 MG/ML ophthalmic solution, Place 1 drop into both eyes 2 (two) times daily., Disp: , Rfl: 5 .  gabapentin (NEURONTIN) 300 MG capsule, Take 1 capsule by mouth., Disp: , Rfl:  .  glucose blood test strip, Use three test strips daily as instructed., Disp: , Rfl:  .  hydrochlorothiazide (HYDRODIURIL) 25 MG tablet,  Take 25 mg by mouth daily., Disp: , Rfl:  .  insulin aspart (NOVOLOG) 100 UNIT/ML injection, Inject 20-25 Units into the skin 2 (two) times daily before a meal. Takes 20 units before breakfast and 25 units before dinner., Disp: , Rfl:  .  insulin detemir (LEVEMIR) 100 UNIT/ML injection, Inject 20 units into the skin in the morning and 55 units at bedtime, Disp: , Rfl:  .  latanoprost (XALATAN) 0.005 % ophthalmic solution, Place 1 drop into both eyes at bedtime. , Disp: , Rfl:  .  levothyroxine (SYNTHROID) 112 MCG tablet, Take 1 tablet (112 mcg total) by mouth daily before breakfast., Disp: 90 tablet, Rfl: 3 .  losartan (COZAAR) 100 MG tablet, TAKE 1 TABLET BY MOUTH EVERY DAY, Disp: 90 tablet, Rfl: 3 .  metFORMIN (GLUCOPHAGE) 1000 MG tablet, Take 2,000 mg by mouth daily after supper., Disp: , Rfl:  .  pantoprazole (PROTONIX) 40 MG tablet, TAKE 1 TABLET (40 MG TOTAL) BY MOUTH DAILY AT 12  NOON., Disp: 90 tablet, Rfl: 3 .  amitriptyline (ELAVIL) 10 MG tablet, Take 10 mg by mouth at bedtime., Disp: , Rfl:  .  amLODipine (NORVASC) 5 MG tablet, TAKE 1 TABLET (5 MG TOTAL) BY MOUTH DAILY. (Patient not taking: Reported on 01/29/2020), Disp: 90 tablet, Rfl: 3 .  clindamycin (CLEOCIN) 300 MG capsule, Take 2 capsules (600 mg) one hour prior to all dental visits. (Patient not taking: Reported on 01/29/2020), Disp: 4 capsule, Rfl: 6 .  clotrimazole (LOTRIMIN) 1 % cream, Apply 1 application topically 2 (two) times daily. (Patient not taking: Reported on 01/29/2020), Disp: 30 g, Rfl: 0  Review of Systems  Constitutional: Negative for appetite change, chills, fatigue and fever.  HENT: Negative.   Eyes: Positive for visual disturbance.  Respiratory: Negative for chest tightness and shortness of breath.   Cardiovascular: Negative for chest pain, palpitations and leg swelling.  Gastrointestinal: Negative for abdominal pain, constipation, diarrhea, nausea and vomiting.  Endocrine: Positive for cold intolerance. Negative for heat intolerance, polydipsia, polyphagia and polyuria.  Musculoskeletal: Positive for back pain.  Allergic/Immunologic: Negative.   Neurological: Negative for dizziness and weakness.  Psychiatric/Behavioral: Negative.     Social History   Tobacco Use  . Smoking status: Never Smoker  . Smokeless tobacco: Never Used  Substance Use Topics  . Alcohol use: No    Alcohol/week: 0.0 standard drinks      Objective:   BP 133/88 (BP Location: Left Arm, Patient Position: Sitting, Cuff Size: Large)   Pulse 61   Temp (!) 96.9 F (36.1 C) (Temporal)   Resp 16   Ht 5\' 6"  (1.676 m)   Wt 193 lb 6.4 oz (87.7 kg)   BMI 31.22 kg/m  Vitals:   01/29/20 1433  BP: 133/88  Pulse: 61  Resp: 16  Temp: (!) 96.9 F (36.1 C)  TempSrc: Temporal  Weight: 193 lb 6.4 oz (87.7 kg)  Height: 5\' 6"  (1.676 m)  Body mass index is 31.22 kg/m.   Physical Exam Vitals reviewed.   Constitutional:      Appearance: She is well-developed.  HENT:     Head: Normocephalic and atraumatic.     Right Ear: External ear normal.     Left Ear: External ear normal.     Nose: Nose normal.  Eyes:     Conjunctiva/sclera: Conjunctivae normal.     Pupils: Pupils are equal, round, and reactive to light.     Comments: Visual fields intact in  left eye which is  bothering her.  Cardiovascular:     Rate and Rhythm: Normal rate and regular rhythm.     Heart sounds: Murmur present.  Pulmonary:     Effort: Pulmonary effort is normal.     Breath sounds: Normal breath sounds.  Abdominal:     General: Bowel sounds are normal.     Palpations: Abdomen is soft.  Musculoskeletal:     Cervical back: Normal range of motion and neck supple.     Comments: Mild osteoarthritic changes of the hands.  Worse on the right fourth digit  Skin:    General: Skin is warm and dry.  Neurological:     General: No focal deficit present.     Mental Status: She is alert and oriented to person, place, and time.  Psychiatric:        Mood and Affect: Mood normal.        Behavior: Behavior normal.        Thought Content: Thought content normal.        Judgment: Judgment normal.      Results for orders placed or performed in visit on 47/82/95  Basic metabolic panel  Result Value Ref Range   BUN 24 (A) 4 - 21   CO2 23 (A) 13 - 22   Creatinine 1.3 (A) 0.5 - 1.1   Potassium 5.0 3.4 - 5.3   Sodium 138 137 - 147   Chloride 108 99 - 108  Hemoglobin A1c  Result Value Ref Range   Hemoglobin A1C 7.1        Assessment & Plan    1. Type 2 diabetes mellitus with diabetic nephropathy, unspecified whether long term insulin use ([place]) Followed by Dr. Mickie Kay from endocrinology.  2. Benign essential HTN Controlled on HCTZ, losartan,   3. Acquired hypothyroidism  - TSH - Sedimentation rate  4. Hypercholesterolemia On atorvastatin  5. Bilateral low back pain, unspecified chronicity, unspecified whether  sciatica present   6. Osteoarthritis of hand, unspecified laterality, unspecified osteoarthritis type Try topical Voltaren gel over-the-counter - Sedimentation rate 7.Obesity     Wilhemena Durie, MD  [facility]

## 2020-01-29 ENCOUNTER — Ambulatory Visit (INDEPENDENT_AMBULATORY_CARE_PROVIDER_SITE_OTHER): Payer: Medicare HMO | Admitting: Family Medicine

## 2020-01-29 ENCOUNTER — Encounter: Payer: Self-pay | Admitting: Family Medicine

## 2020-01-29 ENCOUNTER — Other Ambulatory Visit: Payer: Self-pay

## 2020-01-29 VITALS — BP 133/88 | HR 61 | Temp 96.9°F | Resp 16 | Ht 66.0 in | Wt 193.4 lb

## 2020-01-29 DIAGNOSIS — Z6832 Body mass index (BMI) 32.0-32.9, adult: Secondary | ICD-10-CM

## 2020-01-29 DIAGNOSIS — E1121 Type 2 diabetes mellitus with diabetic nephropathy: Secondary | ICD-10-CM

## 2020-01-29 DIAGNOSIS — M545 Low back pain, unspecified: Secondary | ICD-10-CM

## 2020-01-29 DIAGNOSIS — M19049 Primary osteoarthritis, unspecified hand: Secondary | ICD-10-CM

## 2020-01-29 DIAGNOSIS — E039 Hypothyroidism, unspecified: Secondary | ICD-10-CM

## 2020-01-29 DIAGNOSIS — E7800 Pure hypercholesterolemia, unspecified: Secondary | ICD-10-CM | POA: Diagnosis not present

## 2020-01-29 DIAGNOSIS — I10 Essential (primary) hypertension: Secondary | ICD-10-CM | POA: Diagnosis not present

## 2020-01-29 DIAGNOSIS — E6609 Other obesity due to excess calories: Secondary | ICD-10-CM | POA: Diagnosis not present

## 2020-01-29 NOTE — Patient Instructions (Signed)
Try over the counter Voltaren Gel for hand pain, two times a day.

## 2020-01-30 ENCOUNTER — Telehealth: Payer: Self-pay | Admitting: *Deleted

## 2020-01-30 LAB — TSH: TSH: 3 u[IU]/mL (ref 0.450–4.500)

## 2020-01-30 LAB — SEDIMENTATION RATE: Sed Rate: 20 mm/hr (ref 0–40)

## 2020-01-30 NOTE — Telephone Encounter (Signed)
LMOVM for pt to return call. Okay for [facility] triage to give pt results.

## 2020-01-30 NOTE — Telephone Encounter (Signed)
-----   Message from Jerrol Banana., MD sent at 01/30/2020  8:20 AM EST ----- Labs normal including thyroid.

## 2020-01-30 NOTE — Telephone Encounter (Signed)
Pt given results per Dr Rosanna Randy; she verbalized understanding.

## 2020-02-19 DIAGNOSIS — N1832 Chronic kidney disease, stage 3b: Secondary | ICD-10-CM | POA: Diagnosis not present

## 2020-02-19 DIAGNOSIS — I350 Nonrheumatic aortic (valve) stenosis: Secondary | ICD-10-CM | POA: Diagnosis not present

## 2020-02-19 DIAGNOSIS — Z794 Long term (current) use of insulin: Secondary | ICD-10-CM | POA: Diagnosis not present

## 2020-02-19 DIAGNOSIS — R809 Proteinuria, unspecified: Secondary | ICD-10-CM | POA: Diagnosis not present

## 2020-02-19 DIAGNOSIS — E782 Mixed hyperlipidemia: Secondary | ICD-10-CM | POA: Diagnosis not present

## 2020-02-19 DIAGNOSIS — I10 Essential (primary) hypertension: Secondary | ICD-10-CM | POA: Diagnosis not present

## 2020-02-19 DIAGNOSIS — I2510 Atherosclerotic heart disease of native coronary artery without angina pectoris: Secondary | ICD-10-CM | POA: Diagnosis not present

## 2020-02-19 DIAGNOSIS — E1129 Type 2 diabetes mellitus with other diabetic kidney complication: Secondary | ICD-10-CM | POA: Diagnosis not present

## 2020-02-26 DIAGNOSIS — D631 Anemia in chronic kidney disease: Secondary | ICD-10-CM | POA: Diagnosis not present

## 2020-02-26 DIAGNOSIS — I129 Hypertensive chronic kidney disease with stage 1 through stage 4 chronic kidney disease, or unspecified chronic kidney disease: Secondary | ICD-10-CM | POA: Diagnosis not present

## 2020-02-26 DIAGNOSIS — E1122 Type 2 diabetes mellitus with diabetic chronic kidney disease: Secondary | ICD-10-CM | POA: Diagnosis not present

## 2020-02-26 DIAGNOSIS — R809 Proteinuria, unspecified: Secondary | ICD-10-CM

## 2020-02-26 DIAGNOSIS — N1832 Chronic kidney disease, stage 3b: Secondary | ICD-10-CM | POA: Diagnosis not present

## 2020-02-26 DIAGNOSIS — N2581 Secondary hyperparathyroidism of renal origin: Secondary | ICD-10-CM

## 2020-02-26 HISTORY — DX: Secondary hyperparathyroidism of renal origin: N25.81

## 2020-02-26 HISTORY — DX: Proteinuria, unspecified: R80.9

## 2020-02-26 HISTORY — DX: Hypertensive chronic kidney disease with stage 1 through stage 4 chronic kidney disease, or unspecified chronic kidney disease: I12.9

## 2020-02-27 DIAGNOSIS — N1832 Chronic kidney disease, stage 3b: Secondary | ICD-10-CM | POA: Diagnosis not present

## 2020-02-27 DIAGNOSIS — Z794 Long term (current) use of insulin: Secondary | ICD-10-CM | POA: Diagnosis not present

## 2020-02-27 DIAGNOSIS — E11319 Type 2 diabetes mellitus with unspecified diabetic retinopathy without macular edema: Secondary | ICD-10-CM | POA: Diagnosis not present

## 2020-03-01 DIAGNOSIS — N1832 Chronic kidney disease, stage 3b: Secondary | ICD-10-CM | POA: Diagnosis not present

## 2020-03-01 DIAGNOSIS — E11319 Type 2 diabetes mellitus with unspecified diabetic retinopathy without macular edema: Secondary | ICD-10-CM | POA: Diagnosis not present

## 2020-03-01 DIAGNOSIS — Z794 Long term (current) use of insulin: Secondary | ICD-10-CM | POA: Diagnosis not present

## 2020-03-12 DIAGNOSIS — R69 Illness, unspecified: Secondary | ICD-10-CM | POA: Diagnosis not present

## 2020-03-18 DIAGNOSIS — I350 Nonrheumatic aortic (valve) stenosis: Secondary | ICD-10-CM | POA: Diagnosis not present

## 2020-03-25 DIAGNOSIS — I2510 Atherosclerotic heart disease of native coronary artery without angina pectoris: Secondary | ICD-10-CM | POA: Diagnosis not present

## 2020-03-25 DIAGNOSIS — E782 Mixed hyperlipidemia: Secondary | ICD-10-CM | POA: Diagnosis not present

## 2020-03-25 DIAGNOSIS — I10 Essential (primary) hypertension: Secondary | ICD-10-CM | POA: Diagnosis not present

## 2020-03-25 DIAGNOSIS — I350 Nonrheumatic aortic (valve) stenosis: Secondary | ICD-10-CM | POA: Diagnosis not present

## 2020-04-09 DIAGNOSIS — Z794 Long term (current) use of insulin: Secondary | ICD-10-CM | POA: Diagnosis not present

## 2020-04-09 DIAGNOSIS — E1129 Type 2 diabetes mellitus with other diabetic kidney complication: Secondary | ICD-10-CM | POA: Diagnosis not present

## 2020-04-09 DIAGNOSIS — R809 Proteinuria, unspecified: Secondary | ICD-10-CM | POA: Diagnosis not present

## 2020-04-09 DIAGNOSIS — R5383 Other fatigue: Secondary | ICD-10-CM | POA: Diagnosis not present

## 2020-04-09 DIAGNOSIS — E11319 Type 2 diabetes mellitus with unspecified diabetic retinopathy without macular edema: Secondary | ICD-10-CM | POA: Diagnosis not present

## 2020-04-09 DIAGNOSIS — E1142 Type 2 diabetes mellitus with diabetic polyneuropathy: Secondary | ICD-10-CM | POA: Diagnosis not present

## 2020-04-15 DIAGNOSIS — H401132 Primary open-angle glaucoma, bilateral, moderate stage: Secondary | ICD-10-CM | POA: Diagnosis not present

## 2020-04-16 DIAGNOSIS — H401131 Primary open-angle glaucoma, bilateral, mild stage: Secondary | ICD-10-CM | POA: Diagnosis not present

## 2020-04-16 LAB — HM DIABETES EYE EXAM

## 2020-05-08 ENCOUNTER — Other Ambulatory Visit: Payer: Self-pay | Admitting: Family Medicine

## 2020-05-08 DIAGNOSIS — I10 Essential (primary) hypertension: Secondary | ICD-10-CM

## 2020-05-08 NOTE — Telephone Encounter (Signed)
Requested Prescriptions  Pending Prescriptions Disp Refills   carvedilol (COREG) 25 MG tablet [Pharmacy Med Name: CARVEDILOL 25 MG TABLET] 180 tablet 3    Sig: TAKE 1 TABLET BY MOUTH 2 TIMES DAILY WITH A MEAL.     Cardiovascular:  Beta Blockers Passed - 05/08/2020  2:32 PM      Passed - Last BP in normal range    BP Readings from Last 1 Encounters:  01/29/20 133/88         Passed - Last Heart Rate in normal range    Pulse Readings from Last 1 Encounters:  01/29/20 61         Passed - Valid encounter within last 6 months    Recent Outpatient Visits          3 months ago Type 2 diabetes mellitus with diabetic nephropathy, unspecified whether long term insulin use [facility])   [facility] Jerrol Banana., MD   9 months ago Annual physical exam   [facility] Jerrol Banana., MD   10 months ago Bilateral low back pain without sciatica, unspecified chronicity   [facility] Jerrol Banana., MD   1 year ago Benign essential HTN   [facility] Jerrol Banana., MD   1 year ago Benign essential HTN   [facility] Jerrol Banana., MD      Future Appointments            In 2 months Jerrol Banana., MD [facility] [place]

## 2020-06-03 DIAGNOSIS — R69 Illness, unspecified: Secondary | ICD-10-CM | POA: Diagnosis not present

## 2020-06-18 ENCOUNTER — Other Ambulatory Visit: Payer: Self-pay | Admitting: Family Medicine

## 2020-07-09 DIAGNOSIS — E1142 Type 2 diabetes mellitus with diabetic polyneuropathy: Secondary | ICD-10-CM | POA: Diagnosis not present

## 2020-07-09 DIAGNOSIS — R5383 Other fatigue: Secondary | ICD-10-CM | POA: Diagnosis not present

## 2020-07-09 LAB — COMPREHENSIVE METABOLIC PANEL
Calcium: 9.4 (ref 8.7–10.7)
GFR calc non Af Amer: 39

## 2020-07-09 LAB — CBC AND DIFFERENTIAL
Hemoglobin: 11.4 — AB (ref 12.0–16.0)
Platelets: 231 (ref 150–399)
WBC: 6.6

## 2020-07-09 LAB — HEMOGLOBIN A1C: Hemoglobin A1C: 7.1

## 2020-07-09 LAB — BASIC METABOLIC PANEL
BUN: 19 (ref 4–21)
CO2: 25 — AB (ref 13–22)
Chloride: 105 (ref 99–108)
Creatinine: 1.3 — AB (ref 0.5–1.1)
Glucose: 202
Potassium: 5.3 (ref 3.4–5.3)
Sodium: 137 (ref 137–147)

## 2020-07-09 LAB — CBC: RBC: 3.97 (ref 3.87–5.11)

## 2020-07-16 DIAGNOSIS — Z20822 Contact with and (suspected) exposure to covid-19: Secondary | ICD-10-CM | POA: Diagnosis not present

## 2020-07-16 DIAGNOSIS — Z20828 Contact with and (suspected) exposure to other viral communicable diseases: Secondary | ICD-10-CM | POA: Diagnosis not present

## 2020-07-20 ENCOUNTER — Ambulatory Visit: Payer: Self-pay | Admitting: *Deleted

## 2020-07-20 ENCOUNTER — Telehealth: Payer: Self-pay

## 2020-07-20 NOTE — Telephone Encounter (Signed)
  Caller is patient's daughter in law and reports patient has been exposed to patient's husband that has recently tested positive for covid. Patient tested within 3 days of exposure and now presenting with cough. Patient has comorbidities and daughter in law wants medication or possible infusion prescribed for patient if PCP allows. Patient's symptom of cough started today. Patient's daughter in law , Stacey Pierce reports patient has no other  Symptoms at this time. Please advise and daughter in law requesting to call her back due to patient may not answer phone. Stacey Pierce contact [phone]. [facility] [place] [phone]. Care advise given to daughter in law for patient. Abigail Butts reported she called earlier and no return call at this time. Abigail Butts verbalized understanding of care advise and to allow time for PCP to review requests.  Reason for Disposition . COVID-19 Testing, questions about  Answer Assessment - Initial Assessment Questions 1. COVID-19 CLOSE CONTACT: "Who is the person with the confirmed or suspected COVID-19 infection that you were exposed to?"     husband 2. PLACE of CONTACT: "Where were you when you were exposed to COVID-19?" (e.g., home, school, medical waiting room; which city?)     home 3. TYPE of CONTACT: "How much contact was there?" (e.g., sitting next to, live in same house, work in same office, same building)     Live in same house hold 4. DURATION of CONTACT: "How long were you in contact with the COVID-19 patient?" (e.g., a few seconds, passed by person, a few minutes, 15 minutes or longer, live with the patient)     Live with patient 5. MASK: "Were you wearing a mask?" "Was the other person wearing a mask?" Note: wearing a mask reduces the risk of an otherwise close contact.     no 6. DATE of CONTACT: "When did you have contact with a COVID-19 patient?" (e.g., how many days ago)     07/17/20 7. COMMUNITY SPREAD: "Are there lots of  cases of COVID-19 (community spread) where you live?" (See public health department website, if unsure)       unknown 8. SYMPTOMS: "Do you have any symptoms?" (e.g., fever, cough, breathing difficulty, loss of taste or smell)     cough 9. PREGNANCY OR POSTPARTUM: "Is there any chance you are pregnant?" "When was your last menstrual period?" "Did you deliver in the last 2 weeks?"     na 10. HIGH RISK: "Do you have any heart or lung problems?" "Do you have a weak immune system?" (e.g., heart failure, COPD, asthma, HIV positive, chemotherapy, renal failure, diabetes mellitus, sickle cell anemia, obesity)       diabetic 11. TRAVEL: "Have you traveled out of the country recently?" If Yes, ask: "When and where?" Also ask about out-of-state travel, since the CDC has identified some high-risk cities for community spread in the [place]. Note: Travel becomes less relevant if there is widespread community transmission where the patient lives.       no  Protocols used: CORONAVIRUS (COVID-19) EXPOSURE-A-AH

## 2020-07-20 NOTE — Telephone Encounter (Signed)
Pt's daughter would like an antibiotic or prednisone called in for patient, she has a bad cough so Abigail Butts is also requesting a cough medication.

## 2020-07-20 NOTE — Telephone Encounter (Signed)
Copied from [place] [id number]. Topic: General - Other >> Jul 20, 2020  3:51 PM Keene Breath wrote: Reason for CRM: Patient's daughter-n-law called to ask the nurse to call regarding patient's symptoms of covid.  The husband tested positive and now the patient has some symptoms.  She wanted to know if there is some medication that she can take.  Please advise and call Abigail Butts, daughter-n-law at [phone]

## 2020-07-21 DIAGNOSIS — U071 COVID-19: Secondary | ICD-10-CM | POA: Diagnosis not present

## 2020-07-21 DIAGNOSIS — Z8616 Personal history of COVID-19: Secondary | ICD-10-CM

## 2020-07-21 DIAGNOSIS — Z03818 Encounter for observation for suspected exposure to other biological agents ruled out: Secondary | ICD-10-CM | POA: Diagnosis not present

## 2020-07-21 DIAGNOSIS — J22 Unspecified acute lower respiratory infection: Secondary | ICD-10-CM | POA: Diagnosis not present

## 2020-07-21 DIAGNOSIS — R05 Cough: Secondary | ICD-10-CM | POA: Diagnosis not present

## 2020-07-21 HISTORY — DX: Personal history of COVID-19: Z86.16

## 2020-07-21 NOTE — Telephone Encounter (Signed)
Spoke to patient's daughter and she claims she is upset because no one had reached back out to her but patient had called at 3:51 PM yesterday and message was not sent to provider at 4:45 PM. Patient called back at 6:45 PM when the office was closed. Patient said that she got tired of waiting so she took the patient to be seen elsewhere.

## 2020-07-21 NOTE — Telephone Encounter (Signed)
Patient will need to get Covid testing and then referred for outpatient immunotherapy.  If she gets short of breath she needs to go to the emergency room.

## 2020-07-21 NOTE — Telephone Encounter (Signed)
Left message for daughter in law to call office back, I need more information. When did patient test for covid? What were here results? Has she been taking or tried anything otc for cough? Does she have shortness of breath? Is she staying quaratined in home? Is she running a fever? Are you requesting prescription to treat cough? Please have patient/daughter in law  speak with [facility] triage nurse to be triaged

## 2020-07-21 NOTE — Telephone Encounter (Signed)
See other message

## 2020-07-26 ENCOUNTER — Other Ambulatory Visit (HOSPITAL_COMMUNITY): Payer: Self-pay | Admitting: Oncology

## 2020-07-26 ENCOUNTER — Ambulatory Visit (HOSPITAL_COMMUNITY)
Admission: RE | Admit: 2020-07-26 | Discharge: 2020-07-26 | Disposition: A | Discharge status: Home / Self Care | Payer: Medicare Other | Source: Ambulatory Visit | Attending: Pulmonary Disease | Admitting: Pulmonary Disease

## 2020-07-26 ENCOUNTER — Encounter: Payer: Self-pay | Admitting: Oncology

## 2020-07-26 DIAGNOSIS — U071 COVID-19: Secondary | ICD-10-CM | POA: Diagnosis not present

## 2020-07-26 DIAGNOSIS — Z23 Encounter for immunization: Secondary | ICD-10-CM | POA: Diagnosis not present

## 2020-07-26 MED ORDER — EPINEPHRINE 0.3 MG/0.3ML IJ SOAJ: 0.3000 mg | Freq: Once | INTRAMUSCULAR | Status: DC | PRN

## 2020-07-26 MED ORDER — SODIUM CHLORIDE 0.9 % IV SOLN: INTRAVENOUS | Status: DC | PRN

## 2020-07-26 MED ORDER — METHYLPREDNISOLONE SODIUM SUCC 125 MG IJ SOLR: 125.0000 mg | Freq: Once | INTRAMUSCULAR | Status: DC | PRN

## 2020-07-26 MED ORDER — DIPHENHYDRAMINE HCL 50 MG/ML IJ SOLN: 50.0000 mg | Freq: Once | INTRAMUSCULAR | Status: DC | PRN

## 2020-07-26 MED ORDER — SODIUM CHLORIDE 0.9 % IV SOLN
1200.0000 mg | Freq: Once | INTRAVENOUS | Status: AC
  Administered 2020-07-26: 1200 mg via INTRAVENOUS
  Filled 2020-07-26: qty 10

## 2020-07-26 MED ORDER — ALBUTEROL SULFATE HFA 108 (90 BASE) MCG/ACT IN AERS: 2.0000 | INHALATION_SPRAY | Freq: Once | RESPIRATORY_TRACT | Status: DC | PRN

## 2020-07-26 MED ORDER — FAMOTIDINE IN NACL 20-0.9 MG/50ML-% IV SOLN: 20.0000 mg | Freq: Once | INTRAVENOUS | Status: DC | PRN

## 2020-07-26 NOTE — Progress Notes (Signed)
I connected by phone with  Stacey Pierce to discuss the potential use of an new treatment for mild to moderate COVID-19 viral infection in non-hospitalized patients.   This patient is a age/sex that meets the FDA criteria for Emergency Use Authorization of casirivimab\imdevimab.  Has a (+) direct SARS-CoV-2 viral test result 1. Has mild or moderate COVID-19  2. Is ? 80 years of age and weighs ? 40 kg 3. Is NOT hospitalized due to COVID-19 4. Is NOT requiring oxygen therapy or requiring an increase in baseline oxygen flow rate due to COVID-19 5. Is within 10 days of symptom onset 6. Has at least one of the high risk factor(s) for progression to severe COVID-19 and/or hospitalization as defined in EUA. ? Specific high risk criteria :DM, Obesity, age    Symptom onset 07/20/20   I have spoken and communicated the following to the patient or parent/caregiver:   1. FDA has authorized the emergency use of casirivimab\imdevimab for the treatment of mild to moderate COVID-19 in adults and pediatric patients with positive results of direct SARS-CoV-2 viral testing who are 49 years of age and older weighing at least 40 kg, and who are at high risk for progressing to severe COVID-19 and/or hospitalization.   2. The significant known and potential risks and benefits of casirivimab\imdevimab, and the extent to which such potential risks and benefits are unknown.   3. Information on available alternative treatments and the risks and benefits of those alternatives, including clinical trials.   4. Patients treated with casirivimab\imdevimab should continue to self-isolate and use infection control measures (e.g., wear mask, isolate, social distance, avoid sharing personal items, clean and disinfect "high touch" surfaces, and frequent handwashing) according to CDC guidelines.    5. The patient or parent/caregiver has the option to accept or refuse casirivimab\imdevimab .   After reviewing this information  with the patient, The patient agreed to proceed with receiving casirivimab\imdevimab infusion and will be provided a copy of the Fact sheet prior to receiving the infusion.Rulon Abide, AGNP-C [phone] ([place])

## 2020-07-26 NOTE — Progress Notes (Addendum)
  Diagnosis: COVID-19  Physician:DR Joya Gaskins  Procedure: Covid Infusion Clinic Med: casirivimab\imdevimab infusion - Provided patient with casirivimab\imdevimab fact sheet for patients, parents and caregivers prior to infusion.  Complications: No immediate complications noted.  Discharge: Discharged home   [address] 07/26/2020 Pt BP 178/8mmhg, HR 67.Patient stated she will take her Losartan meds q HS when she got home.NO complaints made.

## 2020-07-26 NOTE — Discharge Instructions (Signed)

## 2020-07-29 ENCOUNTER — Ambulatory Visit: Payer: Self-pay | Admitting: Family Medicine

## 2020-08-05 ENCOUNTER — Ambulatory Visit: Payer: Self-pay | Admitting: Family Medicine

## 2020-08-06 DIAGNOSIS — E1142 Type 2 diabetes mellitus with diabetic polyneuropathy: Secondary | ICD-10-CM | POA: Diagnosis not present

## 2020-08-06 DIAGNOSIS — R809 Proteinuria, unspecified: Secondary | ICD-10-CM | POA: Diagnosis not present

## 2020-08-06 DIAGNOSIS — N1832 Chronic kidney disease, stage 3b: Secondary | ICD-10-CM | POA: Diagnosis not present

## 2020-08-06 DIAGNOSIS — Z794 Long term (current) use of insulin: Secondary | ICD-10-CM | POA: Diagnosis not present

## 2020-08-06 DIAGNOSIS — E11319 Type 2 diabetes mellitus with unspecified diabetic retinopathy without macular edema: Secondary | ICD-10-CM | POA: Diagnosis not present

## 2020-08-06 DIAGNOSIS — E1129 Type 2 diabetes mellitus with other diabetic kidney complication: Secondary | ICD-10-CM | POA: Diagnosis not present

## 2020-08-06 DIAGNOSIS — I10 Essential (primary) hypertension: Secondary | ICD-10-CM | POA: Diagnosis not present

## 2020-08-10 ENCOUNTER — Telehealth: Payer: Self-pay

## 2020-08-10 ENCOUNTER — Ambulatory Visit (INDEPENDENT_AMBULATORY_CARE_PROVIDER_SITE_OTHER): Payer: Medicare HMO | Admitting: Family Medicine

## 2020-08-10 ENCOUNTER — Other Ambulatory Visit: Payer: Self-pay

## 2020-08-10 ENCOUNTER — Encounter: Payer: Self-pay | Admitting: Family Medicine

## 2020-08-10 VITALS — BP 92/52 | HR 60 | Temp 98.4°F | Resp 16 | Ht 62.0 in | Wt 188.8 lb

## 2020-08-10 DIAGNOSIS — E1121 Type 2 diabetes mellitus with diabetic nephropathy: Secondary | ICD-10-CM

## 2020-08-10 DIAGNOSIS — Z6832 Body mass index (BMI) 32.0-32.9, adult: Secondary | ICD-10-CM

## 2020-08-10 DIAGNOSIS — I10 Essential (primary) hypertension: Secondary | ICD-10-CM | POA: Diagnosis not present

## 2020-08-10 DIAGNOSIS — I2510 Atherosclerotic heart disease of native coronary artery without angina pectoris: Secondary | ICD-10-CM

## 2020-08-10 DIAGNOSIS — I350 Nonrheumatic aortic (valve) stenosis: Secondary | ICD-10-CM

## 2020-08-10 DIAGNOSIS — E1165 Type 2 diabetes mellitus with hyperglycemia: Secondary | ICD-10-CM | POA: Diagnosis not present

## 2020-08-10 DIAGNOSIS — E6609 Other obesity due to excess calories: Secondary | ICD-10-CM

## 2020-08-10 NOTE — Assessment & Plan Note (Signed)
Low today 92/52 D/C HCTZ  Continue Amlodipine  Monitor bp at home Follow up in 4 weeks

## 2020-08-10 NOTE — Telephone Encounter (Signed)
ok 

## 2020-08-10 NOTE — Telephone Encounter (Signed)
Copied from [place] [id number]. Topic: Clinical - COVID Pre-Screen >> Aug 10, 2020  8:49 AM Leward Quan A wrote: 1. To the best of your knowledge, have you been in close contact with anyone with a confirmed diagnosis of COVID 19?  no  If no - Proceed to next question; If yes - Schedule patient for a virtual visit  2. Have you had any one or more of the following: fever, chills, cough, shortness of breath or any flu-like symptoms?  no  If no - Proceed to next question; If yes - Schedule patient for a virtual visit  3. Have you been diagnosed with or have a previous diagnosis of COVID 19?  Yes ( 07/21/20 )  If no - Proceed to next question; If yes - Schedule patient for a virtual visit  4. I am going to go over a few other symptoms with you. Please let me know if you are experiencing any of the following: no  Ear, nose or throat discomfort  A sore throat  Headache  Muscle pain  Diarrhea  Loss of taste or smell  If no - Continue with scheduling process; If yes - Document in scheduling notes   Thank you for answering these questions. Please know we will ask you these questions or similar questions when you arrive for your appointment and again it's how we are keeping everyone safe. Also, to keep you safe, please use the provided hand sanitizer when you enter the building. Walker Kehr, we are asking everyone in the building to wear a mask because they help [place] prevent the spread of germs.   Do you have a mask of your own, if not, we are happy to provide one for you. The last thing I want to go over with you is the no visitor guidelines. This means no one can attend the appointment with you unless you need physical assistance. I understand this may be different from your past appointments and I know this may be difficult but please know if someone is driving you we are happy to call them for you once your appointment is over.

## 2020-08-10 NOTE — Patient Instructions (Addendum)
PLEASE GET A BLOOD PRESSURE MONITOR  OMRON WELCH ALLYN STOP HYDROCHLOROTHIAZIDE  Hypertension, Adult High blood pressure (hypertension) is when the force of blood pumping through the arteries is too strong. The arteries are the blood vessels that carry blood from the heart throughout the body. Hypertension forces the heart to work harder to pump blood and may cause arteries to become narrow or stiff. Untreated or uncontrolled hypertension can cause a heart attack, heart failure, a stroke, kidney disease, and other problems. A blood pressure reading consists of a higher number over a lower number. Ideally, your blood pressure should be below 120/80. The first ("top") number is called the systolic pressure. It is a measure of the pressure in your arteries as your heart beats. The second ("bottom") number is called the diastolic pressure. It is a measure of the pressure in your arteries as the heart relaxes. What are the causes? The exact cause of this condition is not known. There are some conditions that result in or are related to high blood pressure. What increases the risk? Some risk factors for high blood pressure are under your control. The following factors may make you more likely to develop this condition:  Smoking.  Having type 2 diabetes mellitus, high cholesterol, or both.  Not getting enough exercise or physical activity.  Being overweight.  Having too much fat, sugar, calories, or salt (sodium) in your diet.  Drinking too much alcohol. Some risk factors for high blood pressure may be difficult or impossible to change. Some of these factors include:  Having chronic kidney disease.  Having a family history of high blood pressure.  Age. Risk increases with age.  Race. You may be at higher risk if you are African American.  Gender. Men are at higher risk than women before age 49. After age 61, women are at higher risk than men.  Having obstructive sleep  apnea.  Stress. What are the signs or symptoms? High blood pressure may not cause symptoms. Very high blood pressure (hypertensive crisis) may cause:  Headache.  Anxiety.  Shortness of breath.  Nosebleed.  Nausea and vomiting.  Vision changes.  Severe chest pain.  Seizures. How is this diagnosed? This condition is diagnosed by measuring your blood pressure while you are seated, with your arm resting on a flat surface, your legs uncrossed, and your feet flat on the floor. The cuff of the blood pressure monitor will be placed directly against the skin of your upper arm at the level of your heart. It should be measured at least twice using the same arm. Certain conditions can cause a difference in blood pressure between your right and left arms. Certain factors can cause blood pressure readings to be lower or higher than normal for a short period of time:  When your blood pressure is higher when you are in a health care provider's office than when you are at home, this is called white coat hypertension. Most people with this condition do not need medicines.  When your blood pressure is higher at home than when you are in a health care provider's office, this is called masked hypertension. Most people with this condition may need medicines to control blood pressure. If you have a high blood pressure reading during one visit or you have normal blood pressure with other risk factors, you may be asked to:  Return on a different day to have your blood pressure checked again.  Monitor your blood pressure at home for 1 week or  longer. If you are diagnosed with hypertension, you may have other blood or imaging tests to help your health care provider understand your overall risk for other conditions. How is this treated? This condition is treated by making healthy lifestyle changes, such as eating healthy foods, exercising more, and reducing your alcohol intake. Your health care provider may  prescribe medicine if lifestyle changes are not enough to get your blood pressure under control, and if:  Your systolic blood pressure is above 130.  Your diastolic blood pressure is above 80. Your personal target blood pressure may vary depending on your medical conditions, your age, and other factors. Follow these instructions at home: Eating and drinking   Eat a diet that is high in fiber and potassium, and low in sodium, added sugar, and fat. An example eating plan is called the DASH (Dietary Approaches to Stop Hypertension) diet. To eat this way: ? Eat plenty of fresh fruits and vegetables. Try to fill one half of your plate at each meal with fruits and vegetables. ? Eat whole grains, such as whole-wheat pasta, brown rice, or whole-grain bread. Fill about one fourth of your plate with whole grains. ? Eat or drink low-fat dairy products, such as skim milk or low-fat yogurt. ? Avoid fatty cuts of meat, processed or cured meats, and poultry with skin. Fill about one fourth of your plate with lean proteins, such as fish, chicken without skin, beans, eggs, or tofu. ? Avoid pre-made and processed foods. These tend to be higher in sodium, added sugar, and fat.  Reduce your daily sodium intake. Most people with hypertension should eat less than 1,500 mg of sodium a day.  Do not drink alcohol if: ? Your health care provider tells you not to drink. ? You are pregnant, may be pregnant, or are planning to become pregnant.  If you drink alcohol: ? Limit how much you use to:  0-1 drink a day for women.  0-2 drinks a day for men. ? Be aware of how much alcohol is in your drink. In the [place] one drink equals one 12 oz bottle of beer (355 mL), one 5 oz glass of wine (148 mL), or one 1 oz glass of hard liquor (44 mL). Lifestyle   Work with your health care provider to maintain a healthy body weight or to lose weight. Ask what an ideal weight is for you.  Get at least 30 minutes of exercise  most days of the week. Activities may include walking, swimming, or biking.  Include exercise to strengthen your muscles (resistance exercise), such as Pilates or lifting weights, as part of your weekly exercise routine. Try to do these types of exercises for 30 minutes at least 3 days a week.  Do not use any products that contain nicotine or tobacco, such as cigarettes, e-cigarettes, and chewing tobacco. If you need help quitting, ask your health care provider.  Monitor your blood pressure at home as told by your health care provider.  Keep all follow-up visits as told by your health care provider. This is important. Medicines  Take over-the-counter and prescription medicines only as told by your health care provider. Follow directions carefully. Blood pressure medicines must be taken as prescribed.  Do not skip doses of blood pressure medicine. Doing this puts you at risk for problems and can make the medicine less effective.  Ask your health care provider about side effects or reactions to medicines that you should watch for. Contact a health care provider  if you:  Think you are having a reaction to a medicine you are taking.  Have headaches that keep coming back (recurring).  Feel dizzy.  Have swelling in your ankles.  Have trouble with your vision. Get help right away if you:  Develop a severe headache or confusion.  Have unusual weakness or numbness.  Feel faint.  Have severe pain in your chest or abdomen.  Vomit repeatedly.  Have trouble breathing. Summary  Hypertension is when the force of blood pumping through your arteries is too strong. If this condition is not controlled, it may put you at risk for serious complications.  Your personal target blood pressure may vary depending on your medical conditions, your age, and other factors. For most people, a normal blood pressure is less than 120/80.  Hypertension is treated with lifestyle changes, medicines, or a  combination of both. Lifestyle changes include losing weight, eating a healthy, low-sodium diet, exercising more, and limiting alcohol. This information is not intended to replace advice given to you by your health care provider. Make sure you discuss any questions you have with your health care provider. Document Revised: 07/31/2018 Document Reviewed: 07/31/2018 Elsevier Patient Education  2020 [employer].

## 2020-08-10 NOTE — Progress Notes (Signed)
Established patient visit  I,Dacoda Finlay S Etienne Mowers,acting as a scribe for Wilhemena Durie, MD.,have documented all relevant documentation on the behalf of Wilhemena Durie, MD,as directed by  Wilhemena Durie, MD while in the presence of Wilhemena Durie, MD.   Patient: Stacey Pierce   DOB: 10/31/1941   79 y.o. Female  MRN: [id number] Visit Date: 08/10/2020  Today's healthcare provider: Wilhemena Durie, MD   Chief Complaint  Patient presents with  . Hypertension   Subjective    HPI  Patient states her blood pressure is but been running high lately.  She has no symptoms.  No neurologic symptoms no chest pain no shortness of breath. Hypertension, follow-up  BP Readings from Last 3 Encounters:  08/10/20 (!) 92/52  07/26/20 (!) 178/62  01/29/20 133/88   Wt Readings from Last 3 Encounters:  08/10/20 188 lb 12.8 oz (85.6 kg)  01/29/20 193 lb 6.4 oz (87.7 kg)  07/24/19 193 lb (87.5 kg)     She was last seen for hypertension 6 months ago.  BP at that visit was 133/88. Management since that visit includes no changes.  She reports excellent compliance with treatment. She is not having side effects.  She is following a Regular diet. She is not exercising. She does not smoke.  Use of agents associated with hypertension: none.   Outside blood pressures are elevated. Symptoms: No chest pain No chest pressure  No palpitations No syncope  No dyspnea No orthopnea  No paroxysmal nocturnal dyspnea Yes lower extremity edema   Pertinent labs: Lab Results  Component Value Date   CHOL 152 07/29/2019   HDL 48 07/29/2019   LDLCALC 71 07/29/2019   TRIG 163 (H) 07/29/2019   CHOLHDL 3.2 07/29/2019   Lab Results  Component Value Date   NA 137 07/09/2020   K 5.3 07/09/2020   CREATININE 1.3 (A) 07/09/2020   GFRNONAA 39 07/09/2020   GFRAA 46 (L) 07/29/2019   GLUCOSE 177 (H) 07/29/2019     The ASCVD Risk score (Goff DC Jr., et al., 2013) failed to calculate for the  following reasons:   The 2013 ASCVD risk score is only valid for ages 75 to 31   ---------------------------------------------------------------------------------------------------   Patient Active Problem List   Diagnosis Date Noted  . Venous insufficiency of both lower extremities 06/13/2018  . Iron deficiency anemia due to chronic blood loss 04/10/2018  . B12 deficiency anemia 03/13/2018  . Dizziness 01/10/2018  . Obesity   . S/P TAVR (transcatheter aortic valve replacement) 09/04/2017  . Aortic stenosis, severe 07/30/2017  . Allergic rhinitis 08/26/2015  . Airway hyperreactivity 08/26/2015  . Back pain, chronic 08/26/2015  . Diabetes ([place]) 08/26/2015  . Degeneration of lumbar or lumbosacral intervertebral disc 08/26/2015  . Acid reflux 08/26/2015  . Glaucoma 08/26/2015  . HLD (hyperlipidemia) 08/26/2015  . Adiposity 08/26/2015  . Benign essential HTN 04/29/2015  . Arteriosclerosis of coronary artery 01/26/2015  . Retinopathy 11/19/2014  . Chronic kidney disease (CKD), stage III (moderate) ([place]) 11/19/2014  . Long-term insulin use ([place]) 11/19/2014  . Adult hypothyroidism 02/17/2014   Social History   Tobacco Use  . Smoking status: Never Smoker  . Smokeless tobacco: Never Used  Vaping Use  . Vaping Use: Never used  Substance Use Topics  . Alcohol use: No    Alcohol/week: 0.0 standard drinks  . Drug use: Never   Allergies  Allergen Reactions  . Hydrocodone-Homatropine     Nausea/dizziness  . Azithromycin  Rash    Other reaction(s): Unknown MYCIN GROUP   . Codeine Other (See Comments)    "COLIC"  . Erythromycin Rash  . Penicillins Rash    Has patient had a PCN reaction causing immediate rash, facial/tongue/throat swelling, SOB or lightheadedness with hypotension: No Has patient had a PCN reaction causing severe rash involving mucus membranes or skin necrosis: No Has patient had a PCN reaction that required hospitalization: No Has patient had a PCN reaction  occurring within the last 10 years: No If all of the above answers are "NO", then may proceed with Cephalosporin use.   . Sulfa Antibiotics Other (See Comments)    "COLIC"       Medications: Outpatient Medications Prior to Visit  Medication Sig  . acetaminophen (TYLENOL) 500 MG tablet Take 1,000 mg by mouth every 6 (six) hours as needed for mild pain.  Marland Kitchen amitriptyline (ELAVIL) 10 MG tablet Take 10 mg by mouth at bedtime.  Marland Kitchen amLODipine (NORVASC) 5 MG tablet TAKE 1 TABLET (5 MG TOTAL) BY MOUTH DAILY.  Marland Kitchen aspirin EC 81 MG tablet Take 81 mg by mouth daily.  Marland Kitchen atorvastatin (LIPITOR) 40 MG tablet TAKE 1 TABLET (40 MG TOTAL) BY MOUTH DAILY.  . BD INSULIN SYRINGE U/F 31G X 5/16" 1 ML MISC 4 (four) times daily.  . carvedilol (COREG) 25 MG tablet TAKE 1 TABLET BY MOUTH 2 TIMES DAILY WITH A MEAL.  . DiphenhydrAMINE HCl ([employer] ALLERGY PO) Take 1 tablet by mouth daily.  . dorzolamide-timolol (COSOPT) 22.3-6.8 MG/ML ophthalmic solution Place 1 drop into both eyes 2 (two) times daily.  Marland Kitchen gabapentin (NEURONTIN) 300 MG capsule Take 2 capsules by mouth.   Marland Kitchen glucose blood test strip Use three test strips daily as instructed.  . insulin aspart (NOVOLOG) 100 UNIT/ML injection Inject 20-25 Units into the skin 2 (two) times daily before a meal. Takes 20 units before breakfast and 25 units before dinner.  . insulin detemir (LEVEMIR) 100 UNIT/ML injection Inject 20 units into the skin in the morning and 55 units at bedtime  . latanoprost (XALATAN) 0.005 % ophthalmic solution Place 1 drop into both eyes at bedtime.   Marland Kitchen levothyroxine (SYNTHROID) 112 MCG tablet Take 1 tablet (112 mcg total) by mouth daily before breakfast.  . losartan (COZAAR) 100 MG tablet TAKE 1 TABLET BY MOUTH EVERY DAY  . metFORMIN (GLUCOPHAGE) 1000 MG tablet Take 2,000 mg by mouth daily after supper.  . [DISCONTINUED] hydrochlorothiazide (HYDRODIURIL) 25 MG tablet Take 25 mg by mouth daily.  . clindamycin (CLEOCIN) 300 MG capsule Take 2 capsules  (600 mg) one hour prior to all dental visits. (Patient not taking: Reported on 01/29/2020)  . pantoprazole (PROTONIX) 40 MG tablet TAKE 1 TABLET (40 MG TOTAL) BY MOUTH DAILY AT 12 NOON. (Patient not taking: Reported on 08/10/2020)  . [DISCONTINUED] clotrimazole (LOTRIMIN) 1 % cream Apply 1 application topically 2 (two) times daily. (Patient not taking: Reported on 01/29/2020)   No facility-administered medications prior to visit.    Review of Systems  Constitutional: Negative for appetite change, chills and fever.  Respiratory: Negative for chest tightness and shortness of breath.   Cardiovascular: Positive for leg swelling. Negative for chest pain and palpitations.     Objective    BP (!) 92/52 (BP Location: Left Arm, Patient Position: Sitting, Cuff Size: Large)   Pulse 60   Temp 98.4 F (36.9 C) (Oral)   Resp 16   Ht 5\' 2"  (1.575 m)   Wt 188 lb 12.8  oz (85.6 kg)   BMI 34.53 kg/m  BP Readings from Last 3 Encounters:  08/10/20 (!) 92/52  07/26/20 (!) 178/62  01/29/20 133/88   Wt Readings from Last 3 Encounters:  08/10/20 188 lb 12.8 oz (85.6 kg)  01/29/20 193 lb 6.4 oz (87.7 kg)  07/24/19 193 lb (87.5 kg)      Physical Exam Vitals reviewed.  Constitutional:      General: She is not in acute distress.    Appearance: Normal appearance. She is well-developed. She is not diaphoretic.  HENT:     Head: Normocephalic and atraumatic.  Eyes:     General: No scleral icterus.    Conjunctiva/sclera: Conjunctivae normal.  Neck:     Thyroid: No thyromegaly.  Cardiovascular:     Rate and Rhythm: Normal rate and regular rhythm.     Pulses: Normal pulses.     Heart sounds: Normal heart sounds. No murmur heard.   Pulmonary:     Effort: Pulmonary effort is normal. No respiratory distress.     Breath sounds: Normal breath sounds. No wheezing, rhonchi or rales.  Musculoskeletal:     Cervical back: Neck supple.     Right lower leg: No edema.     Left lower leg: No edema.   Lymphadenopathy:     Cervical: No cervical adenopathy.  Skin:    General: Skin is warm and dry.     Findings: No rash.  Neurological:     General: No focal deficit present.     Mental Status: She is alert and oriented to person, place, and time.  Psychiatric:        Mood and Affect: Mood normal.        Behavior: Behavior normal.        Thought Content: Thought content normal.        Judgment: Judgment normal.      Results for orders placed or performed in visit on 08/10/20  CBC and differential  Result Value Ref Range   Hemoglobin 11.4 (A) 12.0 - 16.0   Platelets 231 150 - 399   WBC 6.6   CBC  Result Value Ref Range   RBC 3.97 3.87 - 9.52  Basic metabolic panel  Result Value Ref Range   Glucose 202    BUN 19 4 - 21   CO2 25 (A) 13 - 22   Creatinine 1.3 (A) 0.5 - 1.1   Potassium 5.3 3.4 - 5.3   Sodium 137 137 - 147   Chloride 105 99 - 108  Comprehensive metabolic panel  Result Value Ref Range   GFR calc non Af Amer 39    Calcium 9.4 8.7 - 10.7  Hemoglobin A1c  Result Value Ref Range   Hemoglobin A1C 7.1     Assessment & Plan     Problem List Items Addressed This Visit      Cardiovascular and Mediastinum   Benign essential HTN - Primary (Chronic)    Low today 92/52 D/C HCTZ  Continue Amlodipine  Monitor bp at home Follow up in 4 weeks         1. Benign essential HTN Bring in home blood pressure readings and cuff in 1 month.  2. Arteriosclerosis of coronary artery   3. Nonrheumatic aortic valve stenosis   4. Type 2 diabetes mellitus with diabetic nephropathy, unspecified whether long term insulin use (HCC)   5. Class 1 obesity due to excess calories with serious comorbidity and body mass index (BMI) of 32.0  to 32.9 in adult    Return in about 4 weeks (around 09/07/2020) for chronic disease f/u.         Richard Cranford Mon, MD  [facility] [phone] (phone) [phone] (fax)  [place]

## 2020-08-13 ENCOUNTER — Other Ambulatory Visit: Payer: Self-pay | Admitting: Family Medicine

## 2020-08-13 ENCOUNTER — Other Ambulatory Visit: Payer: Self-pay | Admitting: Physician Assistant

## 2020-08-13 DIAGNOSIS — E039 Hypothyroidism, unspecified: Secondary | ICD-10-CM

## 2020-08-13 DIAGNOSIS — I10 Essential (primary) hypertension: Secondary | ICD-10-CM

## 2020-08-23 DIAGNOSIS — R69 Illness, unspecified: Secondary | ICD-10-CM | POA: Diagnosis not present

## 2020-08-25 NOTE — Progress Notes (Signed)
I,Hilarie Sinha,acting as a scribe for Wilhemena Durie, MD.,have documented all relevant documentation on the behalf of Wilhemena Durie, MD,as directed by  Wilhemena Durie, MD while in the presence of Wilhemena Durie, MD.   Annual Wellness Visit     Patient: Stacey Pierce, Female    DOB: 04-13-1941, 79 y.o.   MRN: [id number] Visit Date: 08/26/2020  Today's Provider: Wilhemena Durie, MD   Chief Complaint  Patient presents with   Annual Exam   Subjective    Stacey Pierce is a 79 y.o. female who presents today for her Annual Wellness Visit. She reports consuming a general diet. The patient does not participate in regular exercise at present. She generally feels fairly well. She reports sleeping fairly well. She does have additional problems to discuss today.   HPI Edema. feet since starting Norvasc. She has right shoulder pain recently with abduction.      Medications: Outpatient Medications Prior to Visit  Medication Sig   acetaminophen (TYLENOL) 500 MG tablet Take 1,000 mg by mouth every 6 (six) hours as needed for mild pain.   amitriptyline (ELAVIL) 10 MG tablet Take 10 mg by mouth at bedtime.   amLODipine (NORVASC) 5 MG tablet TAKE 1 TABLET (5 MG TOTAL) BY MOUTH DAILY.   aspirin EC 81 MG tablet Take 81 mg by mouth daily.   atorvastatin (LIPITOR) 40 MG tablet TAKE 1 TABLET (40 MG TOTAL) BY MOUTH DAILY.   BD INSULIN SYRINGE U/F 31G X 5/16" 1 ML MISC 4 (four) times daily.   carvedilol (COREG) 25 MG tablet TAKE 1 TABLET BY MOUTH 2 TIMES DAILY WITH A MEAL.   DiphenhydrAMINE HCl ([facility] ALLERGY PO) Take 1 tablet by mouth daily.   dorzolamide-timolol (COSOPT) 22.3-6.8 MG/ML ophthalmic solution Place 1 drop into both eyes 2 (two) times daily.   glucose blood test strip Use three test strips daily as instructed.   insulin aspart (NOVOLOG) 100 UNIT/ML injection Inject 20-25 Units into the skin 2 (two) times daily before a meal. Takes 20 units before  breakfast and 25 units before dinner.   insulin detemir (LEVEMIR) 100 UNIT/ML injection Inject 20 units into the skin in the morning and 55 units at bedtime   latanoprost (XALATAN) 0.005 % ophthalmic solution Place 1 drop into both eyes at bedtime.    levothyroxine (SYNTHROID) 112 MCG tablet TAKE 1 TABLET (112 MCG TOTAL) BY MOUTH DAILY BEFORE BREAKFAST.   losartan (COZAAR) 100 MG tablet TAKE 1 TABLET BY MOUTH EVERY DAY   metFORMIN (GLUCOPHAGE) 1000 MG tablet Take 2,000 mg by mouth daily after supper.   clindamycin (CLEOCIN) 300 MG capsule Take 2 capsules (600 mg) one hour prior to all dental visits. (Patient not taking: Reported on 01/29/2020)   gabapentin (NEURONTIN) 300 MG capsule Take 2 capsules by mouth.    pantoprazole (PROTONIX) 40 MG tablet TAKE 1 TABLET (40 MG TOTAL) BY MOUTH DAILY AT 12 NOON. (Patient not taking: Reported on 08/10/2020)   No facility-administered medications prior to visit.    Allergies  Allergen Reactions   Hydrocodone-Homatropine     Nausea/dizziness   Azithromycin Rash    Other reaction(s): Unknown MYCIN GROUP    Codeine Other (See Comments)    "COLIC"   Erythromycin Rash   Penicillins Rash    Has patient had a PCN reaction causing immediate rash, facial/tongue/throat swelling, SOB or lightheadedness with hypotension: No Has patient had a PCN reaction causing severe rash involving mucus membranes or skin  necrosis: No Has patient had a PCN reaction that required hospitalization: No Has patient had a PCN reaction occurring within the last 10 years: No If all of the above answers are "NO", then may proceed with Cephalosporin use.    Sulfa Antibiotics Other (See Comments)    "COLIC"    Patient Care Team: Jerrol Banana., MD as PCP - General (Family Medicine) Corey Skains, MD as Consulting Physician (Internal Medicine) Leandrew Koyanagi, MD as Referring Physician (Ophthalmology) Gabriel Carina Betsey Holiday, MD as Physician Assistant  (Endocrinology) Benjaman Kindler, MD as Consulting Physician (Obstetrics and Gynecology) Lavonia Dana, MD as Consulting Physician (Internal Medicine) Ralene Bathe, MD (Dermatology)  Review of Systems  Musculoskeletal: Positive for arthralgias.  Skin: Positive for rash.  All other systems reviewed and are negative.      Objective    Vitals: BP (!) 132/57 (BP Location: Left Arm, Patient Position: Sitting, Cuff Size: Large)    Pulse (!) 59    Temp 98.4 F (36.9 C) (Oral)    Resp 18    Ht 5\' 2"  (1.575 m)    Wt 192 lb (87.1 kg)    SpO2 98%    BMI 35.12 kg/m  BP Readings from Last 3 Encounters:  08/26/20 (!) 132/57  08/10/20 (!) 92/52  07/26/20 (!) 178/62   Wt Readings from Last 3 Encounters:  08/26/20 192 lb (87.1 kg)  08/10/20 188 lb 12.8 oz (85.6 kg)  01/29/20 193 lb 6.4 oz (87.7 kg)      Physical Exam Vitals reviewed.  Constitutional:      Appearance: She is well-developed.  HENT:     Head: Normocephalic and atraumatic.     Right Ear: External ear normal.     Left Ear: External ear normal.     Nose: Nose normal.  Eyes:     Conjunctiva/sclera: Conjunctivae normal.     Pupils: Pupils are equal, round, and reactive to light.  Cardiovascular:     Rate and Rhythm: Normal rate and regular rhythm.     Heart sounds: Normal heart sounds.  Pulmonary:     Effort: Pulmonary effort is normal.     Breath sounds: Normal breath sounds.  Abdominal:     General: Bowel sounds are normal.     Palpations: Abdomen is soft.  Musculoskeletal:     Cervical back: Normal range of motion and neck supple.  Skin:    General: Skin is warm and dry.  Neurological:     Mental Status: She is alert and oriented to person, place, and time.  Psychiatric:        Behavior: Behavior normal.        Thought Content: Thought content normal.        Judgment: Judgment normal.      Most recent functional status assessment: In your present state of health, do you have any difficulty performing  the following activities: 08/10/2020  Hearing? N  Vision? N  Difficulty concentrating or making decisions? N  Walking or climbing stairs? N  Dressing or bathing? N  Doing errands, shopping? N  Some recent data might be hidden   Most recent fall risk assessment: Fall Risk  08/10/2020  Falls in the past year? 0  Comment -  Number falls in past yr: 0  Injury with Fall? 0  Comment -  Risk Factor Category  -  Risk for fall due to : No Fall Risks  Follow up Falls evaluation completed;Falls prevention discussed    Most  recent depression screenings: PHQ 2/9 Scores 08/10/2020 07/23/2019  PHQ - 2 Score 0 0  PHQ- 9 Score - -   Most recent cognitive screening: 6CIT Screen 07/23/2019  What Year? 0 points  What month? 0 points  What time? 0 points  Count back from 20 0 points  Months in reverse 0 points  Repeat phrase 0 points  Total Score 0   Most recent Audit-C alcohol use screening No flowsheet data found. A score of 3 or more in women, and 4 or more in men indicates increased risk for alcohol abuse, EXCEPT if all of the points are from question 1   No results found for any visits on 08/26/20.  Assessment & Plan     Annual wellness visit done today including the all of the following: Reviewed patient's Family Medical History Reviewed and updated list of patient's medical providers Assessment of cognitive impairment was done Assessed patient's functional ability Established a written schedule for health screening [place] Completed and Reviewed  Exercise Activities and Dietary recommendations Goals     DIET - REDUCE SUGAR INTAKE     Recommend decreasing amount of sugar and sweets intake to minimal if not none.      Increase water intake     Recommend increasing water intake to 3 glasses a day.        Immunization History  Administered Date(s) Administered   Fluad Quad(high Dose 65+) 09/10/2019   Influenza, High Dose Seasonal PF 08/26/2015,  11/02/2016, 09/05/2017, 09/25/2018   Pneumococcal Conjugate-13 02/25/2015   Pneumococcal Polysaccharide-23 10/06/2013    Health Maintenance  Topic Date Due   COVID-19 Vaccine (1) Never done   FOOT EXAM  04/17/2020   INFLUENZA VACCINE  07/04/2020   TETANUS/TDAP  12/04/2026 (Originally 12/11/1959)   HEMOGLOBIN A1C  01/09/2021   OPHTHALMOLOGY EXAM  04/16/2021   DEXA SCAN  08/24/2022   PNA vac Low Risk Adult  Completed     Discussed health benefits of physical activity, and encouraged her to engage in regular exercise appropriate for her age and condition.    1. Annual physical exam Pt sees [facility] for well woman care. - Lipid panel - CBC w/Diff/Platelet - Comprehensive Metabolic Panel (CMET) - TSH  2. Type 2 diabetes mellitus with diabetic nephropathy, unspecified whether long term insulin use (HCC)  - Lipid panel - CBC w/Diff/Platelet - Comprehensive Metabolic Panel (CMET) - TSH  3. Hypercholesterolemia  - Lipid panel - CBC w/Diff/Platelet - Comprehensive Metabolic Panel (CMET) - TSH  4. Acquired hypothyroidism  - Lipid panel - CBC w/Diff/Platelet - Comprehensive Metabolic Panel (CMET) - TSH  5. Benign essential HTN Stop norvasc and try Hydralazine 25 mg BID RTC 1-2 months. - Lipid panel - CBC w/Diff/Platelet - Comprehensive Metabolic Panel (CMET) - TSH  6. Need for influenza vaccination  - Flu Vaccine QUAD High Dose(Fluad)   No follow-ups on file.     I, Wilhemena Durie, MD, have reviewed all documentation for this visit. The documentation on 08/29/20 for the exam, diagnosis, procedures, and orders are all accurate and complete.    Richard Cranford Mon, MD  [facility] [phone] (phone) [phone] (fax)  [place]

## 2020-08-26 ENCOUNTER — Encounter: Payer: Self-pay | Admitting: Family Medicine

## 2020-08-26 ENCOUNTER — Ambulatory Visit (INDEPENDENT_AMBULATORY_CARE_PROVIDER_SITE_OTHER): Payer: Medicare HMO | Admitting: Family Medicine

## 2020-08-26 ENCOUNTER — Other Ambulatory Visit: Payer: Self-pay

## 2020-08-26 VITALS — BP 132/57 | HR 59 | Temp 98.4°F | Resp 18 | Ht 62.0 in | Wt 192.0 lb

## 2020-08-26 DIAGNOSIS — E1121 Type 2 diabetes mellitus with diabetic nephropathy: Secondary | ICD-10-CM | POA: Diagnosis not present

## 2020-08-26 DIAGNOSIS — E039 Hypothyroidism, unspecified: Secondary | ICD-10-CM | POA: Diagnosis not present

## 2020-08-26 DIAGNOSIS — E7800 Pure hypercholesterolemia, unspecified: Secondary | ICD-10-CM | POA: Diagnosis not present

## 2020-08-26 DIAGNOSIS — Z0000 Encounter for general adult medical examination without abnormal findings: Secondary | ICD-10-CM

## 2020-08-26 DIAGNOSIS — Z23 Encounter for immunization: Secondary | ICD-10-CM | POA: Diagnosis not present

## 2020-08-26 DIAGNOSIS — I10 Essential (primary) hypertension: Secondary | ICD-10-CM

## 2020-08-26 MED ORDER — HYDRALAZINE HCL 25 MG PO TABS: 25.0000 mg | ORAL_TABLET | Freq: Two times a day (BID) | ORAL | 12 refills | Status: DC

## 2020-08-26 NOTE — Patient Instructions (Signed)
Stop amlodipine.

## 2020-08-27 DIAGNOSIS — E039 Hypothyroidism, unspecified: Secondary | ICD-10-CM | POA: Diagnosis not present

## 2020-08-27 DIAGNOSIS — E1121 Type 2 diabetes mellitus with diabetic nephropathy: Secondary | ICD-10-CM | POA: Diagnosis not present

## 2020-08-27 DIAGNOSIS — I10 Essential (primary) hypertension: Secondary | ICD-10-CM | POA: Diagnosis not present

## 2020-08-27 DIAGNOSIS — E7800 Pure hypercholesterolemia, unspecified: Secondary | ICD-10-CM | POA: Diagnosis not present

## 2020-08-27 DIAGNOSIS — Z0000 Encounter for general adult medical examination without abnormal findings: Secondary | ICD-10-CM | POA: Diagnosis not present

## 2020-08-28 LAB — COMPREHENSIVE METABOLIC PANEL
ALT: 22 IU/L (ref 0–32)
AST: 27 IU/L (ref 0–40)
Albumin/Globulin Ratio: 1.4 (ref 1.2–2.2)
Albumin: 3.9 g/dL (ref 3.7–4.7)
Alkaline Phosphatase: 80 IU/L (ref 44–121)
BUN/Creatinine Ratio: 12 (ref 12–28)
BUN: 16 mg/dL (ref 8–27)
Bilirubin Total: 0.3 mg/dL (ref 0.0–1.2)
CO2: 18 mmol/L — ABNORMAL LOW (ref 20–29)
Calcium: 8.9 mg/dL (ref 8.7–10.3)
Chloride: 106 mmol/L (ref 96–106)
Creatinine, Ser: 1.36 mg/dL — ABNORMAL HIGH (ref 0.57–1.00)
GFR calc Af Amer: 42 mL/min/{1.73_m2} — ABNORMAL LOW (ref >59)
GFR calc non Af Amer: 37 mL/min/{1.73_m2} — ABNORMAL LOW (ref >59)
Globulin, Total: 2.8 g/dL (ref 1.5–4.5)
Glucose: 234 mg/dL — ABNORMAL HIGH (ref 65–99)
Potassium: 4.6 mmol/L (ref 3.5–5.2)
Sodium: 139 mmol/L (ref 134–144)
Total Protein: 6.7 g/dL (ref 6.0–8.5)

## 2020-08-28 LAB — CBC WITH DIFFERENTIAL/PLATELET
Basophils Absolute: 0.1 10*3/uL (ref 0.0–0.2)
Basos: 1 %
EOS (ABSOLUTE): 0.3 10*3/uL (ref 0.0–0.4)
Eos: 5 %
Hematocrit: 32.3 % — ABNORMAL LOW (ref 34.0–46.6)
Hemoglobin: 10.5 g/dL — ABNORMAL LOW (ref 11.1–15.9)
Immature Grans (Abs): 0 10*3/uL (ref 0.0–0.1)
Immature Granulocytes: 0 %
Lymphocytes Absolute: 1.5 10*3/uL (ref 0.7–3.1)
Lymphs: 26 %
MCH: 29.1 pg (ref 26.6–33.0)
MCHC: 32.5 g/dL (ref 31.5–35.7)
MCV: 90 fL (ref 79–97)
Monocytes Absolute: 0.7 10*3/uL (ref 0.1–0.9)
Monocytes: 12 %
Neutrophils Absolute: 3.2 10*3/uL (ref 1.4–7.0)
Neutrophils: 56 %
Platelets: 237 10*3/uL (ref 150–450)
RBC: 3.61 x10E6/uL — ABNORMAL LOW (ref 3.77–5.28)
RDW: 12.6 % (ref 11.7–15.4)
WBC: 5.8 10*3/uL (ref 3.4–10.8)

## 2020-08-28 LAB — LIPID PANEL
Chol/HDL Ratio: 3.8 ratio (ref 0.0–4.4)
Cholesterol, Total: 161 mg/dL (ref 100–199)
HDL: 42 mg/dL (ref >39)
LDL Chol Calc (NIH): 86 mg/dL (ref 0–99)
Triglycerides: 197 mg/dL — ABNORMAL HIGH (ref 0–149)
VLDL Cholesterol Cal: 33 mg/dL (ref 5–40)

## 2020-08-28 LAB — TSH: TSH: 6.97 u[IU]/mL — ABNORMAL HIGH (ref 0.450–4.500)

## 2020-09-01 NOTE — Progress Notes (Signed)
Subjective:   Stacey Pierce is a 79 y.o. female who presents for Medicare Annual (Subsequent) preventive examination.  I connected with Ricki Rodriguez today by telephone and verified that I am speaking with the correct person using two identifiers. Location patient: home Location provider: work Persons participating in the virtual visit: patient, provider.   I discussed the limitations, risks, security and privacy concerns of performing an evaluation and management service by telephone and the availability of in person appointments. I also discussed with the patient that there may be a patient responsible charge related to this service. The patient expressed understanding and verbally consented to this telephonic visit.    Interactive audio and video telecommunications were attempted between this provider and patient, however failed, due to patient having technical difficulties OR patient did not have access to video capability.  We continued and completed visit with audio only.   Review of Systems    N/A  Cardiac Risk Factors include: advanced age (>42men, >97 women);diabetes mellitus;dyslipidemia;hypertension;obesity (BMI >30kg/m2)     Objective:    Today's Vitals   09/02/20 1016  PainSc: 9    There is no height or weight on file to calculate BMI.  Advanced Directives 09/02/2020 07/23/2019 06/27/2019 12/27/2018 07/18/2018 06/21/2018 05/16/2018  Does Patient Have a Medical Advance Directive? No No No No No No No  Does patient want to make changes to medical advance directive? No - Patient declined - - - - - -  Would patient like information on creating a medical advance directive? - No - Patient declined No - Patient declined No - Patient declined No - Patient declined - -    Current Medications (verified) Outpatient Encounter Medications as of 09/02/2020  Medication Sig  . acetaminophen (TYLENOL) 500 MG tablet Take 1,000 mg by mouth every 6 (six) hours as needed for mild pain.  Marland Kitchen  amitriptyline (ELAVIL) 10 MG tablet Take 10 mg by mouth at bedtime.  Marland Kitchen aspirin EC 81 MG tablet Take 81 mg by mouth daily.  Marland Kitchen atorvastatin (LIPITOR) 40 MG tablet TAKE 1 TABLET (40 MG TOTAL) BY MOUTH DAILY.  . BD INSULIN SYRINGE U/F 31G X 5/16" 1 ML MISC 4 (four) times daily.  . carvedilol (COREG) 25 MG tablet TAKE 1 TABLET BY MOUTH 2 TIMES DAILY WITH A MEAL.  . DiphenhydrAMINE HCl ([employer] ALLERGY PO) Take 1 tablet by mouth daily.  . dorzolamide-timolol (COSOPT) 22.3-6.8 MG/ML ophthalmic solution Place 1 drop into both eyes 2 (two) times daily.  Marland Kitchen glucose blood test strip Use three test strips daily as instructed.  . hydrALAZINE (APRESOLINE) 25 MG tablet Take 1 tablet (25 mg total) by mouth 2 times daily at 12 noon and 4 pm.  . insulin aspart (NOVOLOG) 100 UNIT/ML injection Inject 20-25 Units into the skin 2 (two) times daily before a meal. Takes 20 units before breakfast and 25 units before dinner.  . insulin detemir (LEVEMIR) 100 UNIT/ML injection Inject 20 units into the skin in the morning and 55 units at bedtime  . latanoprost (XALATAN) 0.005 % ophthalmic solution Place 1 drop into both eyes at bedtime.   Marland Kitchen levothyroxine (SYNTHROID) 112 MCG tablet TAKE 1 TABLET (112 MCG TOTAL) BY MOUTH DAILY BEFORE BREAKFAST.  Marland Kitchen losartan (COZAAR) 100 MG tablet TAKE 1 TABLET BY MOUTH EVERY DAY  . metFORMIN (GLUCOPHAGE) 1000 MG tablet Take 2,000 mg by mouth daily after supper.  Marland Kitchen amLODipine (NORVASC) 5 MG tablet TAKE 1 TABLET (5 MG TOTAL) BY MOUTH DAILY. (Patient not taking:  Reported on 09/02/2020)  . clindamycin (CLEOCIN) 300 MG capsule Take 2 capsules (600 mg) one hour prior to all dental visits. (Patient not taking: Reported on 01/29/2020)  . gabapentin (NEURONTIN) 300 MG capsule Take 2 capsules by mouth.   . pantoprazole (PROTONIX) 40 MG tablet TAKE 1 TABLET (40 MG TOTAL) BY MOUTH DAILY AT 12 NOON. (Patient not taking: Reported on 08/10/2020)  . [DISCONTINUED] levothyroxine (SYNTHROID) 112 MCG tablet Take 1 tablet  (112 mcg total) by mouth daily before breakfast.   No facility-administered encounter medications on file as of 09/02/2020.    Allergies (verified) Hydrocodone-homatropine, Azithromycin, Codeine, Erythromycin, Penicillins, and Sulfa antibiotics   History: Past Medical History:  Diagnosis Date  . Anemia   . Aortic stenosis, severe    a. 09/2017: s/p TAVR  . CKD (chronic kidney disease)   . Diabetes mellitus, insulin dependent (IDDM), uncontrolled   . DR (diabetic retinopathy) ([place])   . GERD (gastroesophageal reflux disease)   . Glaucoma   . Hyperlipidemia   . Hypertension   . Hypothyroidism   . Obesity   . S/P TAVR (transcatheter aortic valve replacement) 09/04/2017   26 mm Medtronic Evolut Pro transcatheter heart valve placed via percutaneous right transfemoral approach    Past Surgical History:  Procedure Laterality Date  . CATARACT EXTRACTION     left 2010  . CATARACT EXTRACTION W/PHACO Right 10/13/2015   Procedure: CATARACT EXTRACTION PHACO AND INTRAOCULAR LENS PLACEMENT (IOC);  Surgeon: Leandrew Koyanagi, MD;  Location: [facility];  Service: Ophthalmology;  Laterality: Right;  DIABETIC - insulin and oral meds MALYUGIN SHUGARCAINE  . CHOLECYSTECTOMY     1967  . COLONOSCOPY WITH PROPOFOL N/A 05/16/2018   Procedure: COLONOSCOPY WITH PROPOFOL;  Surgeon: Lin Landsman, MD;  Location: [facility] ENDOSCOPY;  Service: Gastroenterology;  Laterality: N/A;  . DILATION AND CURETTAGE OF UTERUS    . ESOPHAGOGASTRODUODENOSCOPY (EGD) WITH PROPOFOL N/A 05/16/2018   Procedure: ESOPHAGOGASTRODUODENOSCOPY (EGD) WITH PROPOFOL;  Surgeon: Lin Landsman, MD;  Location: [facility];  Service: Gastroenterology;  Laterality: N/A;  . EYE SURGERY    . LAPAROSCOPIC TUBAL LIGATION    . RIGHT/LEFT HEART CATH AND CORONARY ANGIOGRAPHY N/A 08/02/2017   Procedure: RIGHT/LEFT HEART CATH AND CORONARY ANGIOGRAPHY;  Surgeon: Corey Skains, MD;  Location: [facility] CV LAB;  Service:  Cardiovascular;  Laterality: N/A;  . TEE WITHOUT CARDIOVERSION N/A 09/04/2017   Procedure: TRANSESOPHAGEAL ECHOCARDIOGRAM (TEE);  Surgeon: Sherren Mocha, MD;  Location: [facility];  Service: Open Heart Surgery;  Laterality: N/A;  . TONSILLECTOMY     1974  . TRANSCATHETER AORTIC VALVE REPLACEMENT, TRANSFEMORAL N/A 09/04/2017   Procedure: TRANSCATHETER AORTIC VALVE REPLACEMENT, TRANSFEMORAL;  Surgeon: Sherren Mocha, MD;  Location: [facility];  Service: Open Heart Surgery;  Laterality: N/A;  . TUBAL LIGATION     Family History  Problem Relation Age of Onset  . Alzheimer's disease Mother   . Colon polyps Mother   . Hypertension Mother   . Heart attack Father   . Coronary artery disease Father   . Diabetes Father   . Breast cancer Sister   . Colon polyps Sister   . Diabetes Sister   . Diabetes Sister   . Diabetes Sister   . Diabetes Sister    Social History   Socioeconomic History  . Marital status: Married    Spouse name: Not on file  . Number of children: 3  . Years of education: Not on file  . Highest education level: GED or equivalent  Occupational History    Employer: [employer]    Comment: part time  Tobacco Use  . Smoking status: Never Smoker  . Smokeless tobacco: Never Used  Vaping Use  . Vaping Use: Never used  Substance and Sexual Activity  . Alcohol use: No    Alcohol/week: 0.0 standard drinks  . Drug use: Never  . Sexual activity: Not Currently  Other Topics Concern  . Not on file  Social History Narrative  . Not on file   Social Determinants of Health   Financial Resource Strain: Low Risk   . Difficulty of Paying Living Expenses: Not hard at all  Food Insecurity: No Food Insecurity  . Worried About Charity fundraiser in the Last Year: Never true  . Ran Out of Food in the Last Year: Never true  Transportation Needs: No Transportation Needs  . Lack of Transportation (Medical): No  . Lack of Transportation (Non-Medical): No  Physical Activity:  Inactive  . Days of Exercise per Week: 0 days  . Minutes of Exercise per Session: 0 min  Stress: No Stress Concern Present  . Feeling of Stress : Only a little  Social Connections: Moderately Integrated  . Frequency of Communication with Friends and Family: More than three times a week  . Frequency of Social Gatherings with Friends and Family: More than three times a week  . Attends Religious Services: More than 4 times per year  . Active Member of Clubs or Organizations: No  . Attends Archivist Meetings: Never  . Marital Status: Married    Tobacco Counseling Counseling given: Not Answered   Clinical Intake:  Pre-visit preparation completed: Yes  Pain : 0-10 Pain Score: 9  Pain Type: Chronic pain (arthritis) Pain Location: Leg Pain Orientation: Right, Left Pain Descriptors / Indicators: Aching Pain Frequency: Intermittent Pain Relieving Factors: Pt is taking Tylenol and using Voltaren gel as needed for pain.  Pain Relieving Factors: Pt is taking Tylenol and using Voltaren gel as needed for pain.  Nutritional Risks: None Diabetes: Yes  How often do you need to have someone help you when you read instructions, pamphlets, or other written materials from your doctor or pharmacy?: 1 - Never  Diabetic? Yes  Nutrition Risk Assessment:  Has the patient had any N/V/D within the last 2 months?  No  Does the patient have any non-healing wounds?  No  Has the patient had any unintentional weight loss or weight gain?  No   Diabetes:  Is the patient diabetic?  Yes  If diabetic, was a CBG obtained today?  No  Did the patient bring in their glucometer from home?  No  How often do you monitor your CBG's? Three to four times daily.   Financial Strains and Diabetes Management:  Are you having any financial strains with the device, your supplies or your medication? No .  Does the patient want to be seen by Chronic Care Management for management of their diabetes?  No    Would the patient like to be referred to a Nutritionist or for Diabetic Management?  No   Diabetic Exams:  Diabetic Eye Exam: Completed 04/16/20 Diabetic Foot Exam: Completed 08/26/20. Repeat yearly.   Interpreter Needed?: No  Information entered by :: [facility], LPN   Activities of Daily Living In your present state of health, do you have any difficulty performing the following activities: 09/02/2020 08/10/2020  Hearing? N N  Vision? Y N  Comment Trouble seeing out of the left  eye after cataract sx years ago. -  Difficulty concentrating or making decisions? N N  Walking or climbing stairs? N N  Dressing or bathing? N N  Doing errands, shopping? N N  Preparing Food and eating ? N -  Using the Toilet? N -  In the past six months, have you accidently leaked urine? N -  Do you have problems with loss of bowel control? N -  Managing your Medications? N -  Managing your Finances? N -  Housekeeping or managing your Housekeeping? N -  Some recent data might be hidden    Patient Care Team: Jerrol Banana., MD as PCP - General (Family Medicine) Corey Skains, MD as Consulting Physician (Internal Medicine) Leandrew Koyanagi, MD as Referring Physician (Ophthalmology) Gabriel Carina, Betsey Holiday, MD as Physician Assistant (Endocrinology) Benjaman Kindler, MD as Consulting Physician (Obstetrics and Gynecology) Lavonia Dana, MD as Consulting Physician (Internal Medicine) Jannet Mantis, MD (Dermatology)  Indicate any recent Medical Services you may have received from other than [facility] providers in the past year (date may be approximate).     Assessment:   This is a routine wellness examination for Stacey Pierce.  Hearing/Vision screen No exam data present  Dietary issues and exercise activities discussed: Current Exercise Habits: The patient does not participate in regular exercise at present, Exercise limited by: orthopedic condition(s)  Goals    . DIET - REDUCE SUGAR INTAKE      Recommend decreasing amount of sugar and sweets intake to minimal if not none.     . Increase water intake     Recommend increasing water intake to 3 glasses a day.       Depression Screen PHQ 2/9 Scores 08/10/2020 07/23/2019 07/18/2018 05/17/2017 05/17/2017 11/02/2016 08/24/2016  PHQ - 2 Score 0 0 0 0 0 0 0  PHQ- 9 Score - - 0 3 - - -    Fall Risk Fall Risk  09/02/2020 08/10/2020 07/23/2019 07/18/2018 04/24/2018  Falls in the past year? 0 0 0 Yes No  Comment - - - - -  Number falls in past yr: 0 0 - 1 -  Injury with Fall? 0 0 - No -  Comment - - - - -  Risk Factor Category  - - - - -  Risk for fall due to : - No Fall Risks - - -  Follow up - Falls evaluation completed;Falls prevention discussed - Falls prevention discussed -    Any stairs in or around the home? Yes  If so, are there any without handrails? No  Home free of loose throw rugs in walkways, pet beds, electrical cords, etc? Yes  Adequate lighting in your home to reduce risk of falls? Yes   ASSISTIVE DEVICES UTILIZED TO PREVENT FALLS:  Life alert? No  Use of a cane, walker or w/c? No Grab bars in the bathroom? Yes  Shower chair or bench in shower? Yes  Elevated toilet seat or a handicapped toilet? Yes    Cognitive Function: Declined today.     6CIT Screen 07/23/2019 05/17/2017 11/02/2016  What Year? 0 points 0 points 0 points  What month? 0 points 0 points 0 points  What time? 0 points 0 points 0 points  Count back from 20 0 points 0 points 0 points  Months in reverse 0 points 0 points 0 points  Repeat phrase 0 points 2 points 0 points  Total Score 0 2 0    Immunizations Immunization History  Administered Date(s) Administered  .  Fluad Quad(high Dose 65+) 09/10/2019, 08/26/2020  . Influenza, High Dose Seasonal PF 08/26/2015, 11/02/2016, 09/05/2017, 09/25/2018  . Pneumococcal Conjugate-13 02/25/2015  . Pneumococcal Polysaccharide-23 10/06/2013    TDAP status: Due, Education has been provided regarding the  importance of this vaccine. Advised may receive this vaccine at local pharmacy or Health Dept. Aware to provide a copy of the vaccination record if obtained from local pharmacy or Health Dept. Verbalized acceptance and understanding. Flu Vaccine status: Up to date Pneumococcal vaccine status: Up to date Covid-19 vaccine status: Declined, Education has been provided regarding the importance of this vaccine but patient still declined. Advised may receive this vaccine at local pharmacy or Health Dept.or vaccine clinic. Aware to provide a copy of the vaccination record if obtained from local pharmacy or Health Dept. Verbalized acceptance and understanding.  Qualifies for Shingles Vaccine? Yes   Zostavax completed No   Shingrix Completed?: No.    Education has been provided regarding the importance of this vaccine. Patient has been advised to call insurance company to determine out of pocket expense if they have not yet received this vaccine. Advised may also receive vaccine at local pharmacy or Health Dept. Verbalized acceptance and understanding.  Screening Tests Health Maintenance  Topic Date Due  . COVID-19 Vaccine (1) Never done  . TETANUS/TDAP  12/04/2026 (Originally 12/11/1959)  . HEMOGLOBIN A1C  01/09/2021  . OPHTHALMOLOGY EXAM  04/16/2021  . FOOT EXAM  08/26/2021  . DEXA SCAN  08/24/2022  . INFLUENZA VACCINE  Completed  . PNA vac Low Risk Adult  Completed    Health Maintenance  Health Maintenance Due  Topic Date Due  . COVID-19 Vaccine (1) Never done    Colorectal cancer screening: No longer required.  Mammogram status: No longer required.  Bone Density status: Completed 08/25/19. Results reflect: Bone density results: OSTEOPENIA. Repeat every 3 years.  Lung Cancer Screening: (Low Dose CT Chest recommended if Age 69-80 years, 30 pack-year currently smoking OR have quit w/in 15years.) does not qualify.   Additional Screening:  Vision Screening: Recommended annual ophthalmology  exams for early detection of glaucoma and other disorders of the eye. Is the patient up to date with their annual eye exam?  Yes  Who is the provider or what is the name of the office in which the patient attends annual eye exams? Dr Wallace Going @ [place] If pt is not established with a provider, would they like to be referred to a provider to establish care? No .   Dental Screening: Recommended annual dental exams for proper oral hygiene  Community Resource Referral / Chronic Care Management: CRR required this visit?  No   CCM required this visit?  No      Plan:     I have personally reviewed and noted the following in the patient's chart:   . Medical and social history . Use of alcohol, tobacco or illicit drugs  . Current medications and supplements . Functional ability and status . Nutritional status . Physical activity . Advanced directives . List of other physicians . Hospitalizations, surgeries, and ER visits in previous 12 months . Vitals . Screenings to include cognitive, depression, and falls . Referrals and appointments  In addition, I have reviewed and discussed with patient certain preventive protocols, quality metrics, and best practice recommendations. A written personalized care plan for preventive services as well as general preventive health recommendations were provided to patient.     Candelario Steppe [place], [place]   07/27/2352   Nurse Notes: Pt  declined receiving a future Covid vaccine. Please see telephone encounter regarding b/p concerns.

## 2020-09-02 ENCOUNTER — Other Ambulatory Visit: Payer: Self-pay

## 2020-09-02 ENCOUNTER — Telehealth: Payer: Self-pay

## 2020-09-02 ENCOUNTER — Ambulatory Visit (INDEPENDENT_AMBULATORY_CARE_PROVIDER_SITE_OTHER): Payer: Medicare HMO

## 2020-09-02 ENCOUNTER — Ambulatory Visit: Payer: Self-pay | Admitting: Family Medicine

## 2020-09-02 DIAGNOSIS — I129 Hypertensive chronic kidney disease with stage 1 through stage 4 chronic kidney disease, or unspecified chronic kidney disease: Secondary | ICD-10-CM | POA: Diagnosis not present

## 2020-09-02 DIAGNOSIS — Z0000 Encounter for general adult medical examination without abnormal findings: Secondary | ICD-10-CM | POA: Diagnosis not present

## 2020-09-02 DIAGNOSIS — R809 Proteinuria, unspecified: Secondary | ICD-10-CM | POA: Diagnosis not present

## 2020-09-02 DIAGNOSIS — E1122 Type 2 diabetes mellitus with diabetic chronic kidney disease: Secondary | ICD-10-CM | POA: Diagnosis not present

## 2020-09-02 DIAGNOSIS — D631 Anemia in chronic kidney disease: Secondary | ICD-10-CM | POA: Diagnosis not present

## 2020-09-02 DIAGNOSIS — N2581 Secondary hyperparathyroidism of renal origin: Secondary | ICD-10-CM | POA: Diagnosis not present

## 2020-09-02 DIAGNOSIS — N1832 Chronic kidney disease, stage 3b: Secondary | ICD-10-CM | POA: Diagnosis not present

## 2020-09-02 NOTE — Telephone Encounter (Signed)
Spoke with pt today to complete her telephonic AWV. Pt states she is concerned because she just stopped Amlodipine to Hydralazine at last weeks apt (08/26/20) and since then her b/p has increased. This morning her b/p 155/77. Pt states her feet and legs are swelling and they are aching constantly. Pt is elevating her legs to to help with the pain. Pt has been taking Tylenol for pain as needed.

## 2020-09-02 NOTE — Telephone Encounter (Signed)
Spoke to patient she said she went and saw her nephrologist and he put her on a new medication that starts with a "T" she does not remember what the medication is called but she said she is due to pick up the medication today and that the nephrologist is supposed to send pcp a message regarding this medication. Advised patient to let us know how medication works for her.

## 2020-09-02 NOTE — Telephone Encounter (Signed)
Stop the hydralazine and go back to amlodipine.  Also add chlorthalidone 25 mg every morning

## 2020-09-02 NOTE — Telephone Encounter (Signed)
Spoke with pt today to complete her telephonic AWV.  Pt stated she is concerned because she just switched from Amlodipine to Hydralazine at last weeks apt (08/26/20) and since then her b/p has increased. This morning her b/p was 155/77. Pt states her feet and legs are still swollen and are aching constantly. Pt is elevating her legs to to help with the pain and taking Tylenol as needed. Pt stated the Amlodipine did help control her b/p but that is caused swelling. Pt would like to know if she should try another b/p medication since the increase of her b/p since starting the Hydralazine? Please advise, thank you.

## 2020-09-02 NOTE — Patient Instructions (Signed)
Stacey Pierce , Thank you for taking time to come for your Medicare Wellness Visit. I appreciate your ongoing commitment to your health goals. Please review the following plan we discussed and let me know if I can assist you in the future.   Screening recommendations/referrals: Colonoscopy: No longer required.  Mammogram: No longer required.  Bone Density: Up to date, due 08/2022 Recommended yearly ophthalmology/optometry visit for glaucoma screening and checkup Recommended yearly dental visit for hygiene and checkup  Vaccinations: Influenza vaccine: Done 07/26/20 Pneumococcal vaccine: Completed series Tdap vaccine: Currently due, declined at this time. Shingles vaccine: Shingrix discussed. Please contact your pharmacy for coverage information.     Advanced directives: Advance directive discussed with you today. Even though you declined this today please call our office should you change your mind and we can give you the proper paperwork for you to fill out.  Conditions/risks identified: Recommend to increase water intake to 6-8 8oz glasses a day and continue to cut back on sugar and sweet intake.   Next appointment: 11/04/20 @ 1:20 PM with Dr Rosanna Randy. Declined scheduling an AWV for 2022 at this time.    Preventive Care 37 Years and Older, Female Preventive care refers to lifestyle choices and visits with your health care provider that can promote health and wellness. What does preventive care include?  A yearly physical exam. This is also called an annual well check.  Dental exams once or twice a year.  Routine eye exams. Ask your health care provider how often you should have your eyes checked.  Personal lifestyle choices, including:  Daily care of your teeth and gums.  Regular physical activity.  Eating a healthy diet.  Avoiding tobacco and drug use.  Limiting alcohol use.  Practicing safe sex.  Taking low-dose aspirin every day.  Taking vitamin and mineral supplements  as recommended by your health care provider. What happens during an annual well check? The services and screenings done by your health care provider during your annual well check will depend on your age, overall health, lifestyle risk factors, and family history of disease. Counseling  Your health care provider may ask you questions about your:  Alcohol use.  Tobacco use.  Drug use.  Emotional well-being.  Home and relationship well-being.  Sexual activity.  Eating habits.  History of falls.  Memory and ability to understand (cognition).  Work and work Statistician.  Reproductive health. Screening  You may have the following tests or measurements:  Height, weight, and BMI.  Blood pressure.  Lipid and cholesterol levels. These may be checked every 5 years, or more frequently if you are over 64 years old.  Skin check.  Lung cancer screening. You may have this screening every year starting at age 55 if you have a 30-pack-year history of smoking and currently smoke or have quit within the past 15 years.  Fecal occult blood test (FOBT) of the stool. You may have this test every year starting at age 38.  Flexible sigmoidoscopy or colonoscopy. You may have a sigmoidoscopy every 5 years or a colonoscopy every 10 years starting at age 69.  Hepatitis C blood test.  Hepatitis B blood test.  Sexually transmitted disease (STD) testing.  Diabetes screening. This is done by checking your blood sugar (glucose) after you have not eaten for a while (fasting). You may have this done every 1-3 years.  Bone density scan. This is done to screen for osteoporosis. You may have this done starting at age 88.  Mammogram.  This may be done every 1-2 years. Talk to your health care provider about how often you should have regular mammograms. Talk with your health care provider about your test results, treatment options, and if necessary, the need for more tests. Vaccines  Your health care  provider may recommend certain vaccines, such as:  Influenza vaccine. This is recommended every year.  Tetanus, diphtheria, and acellular pertussis (Tdap, Td) vaccine. You may need a Td booster every 10 years.  Zoster vaccine. You may need this after age 70.  Pneumococcal 13-valent conjugate (PCV13) vaccine. One dose is recommended after age 52.  Pneumococcal polysaccharide (PPSV23) vaccine. One dose is recommended after age 17. Talk to your health care provider about which screenings and vaccines you need and how often you need them. This information is not intended to replace advice given to you by your health care provider. Make sure you discuss any questions you have with your health care provider. Document Released: 12/17/2015 Document Revised: 08/09/2016 Document Reviewed: 09/21/2015 Elsevier Interactive Patient Education  2017 [place] Prevention in the Home Falls can cause injuries. They can happen to people of all ages. There are many things you can do to make your home safe and to help prevent falls. What can I do on the outside of my home?  Regularly fix the edges of walkways and driveways and fix any cracks.  Remove anything that might make you trip as you walk through a door, such as a raised step or threshold.  Trim any bushes or trees on the path to your home.  Use bright outdoor lighting.  Clear any walking paths of anything that might make someone trip, such as rocks or tools.  Regularly check to see if handrails are loose or broken. Make sure that both sides of any steps have handrails.  Any raised decks and porches should have guardrails on the edges.  Have any leaves, snow, or ice cleared regularly.  Use sand or salt on walking paths during winter.  Clean up any spills in your garage right away. This includes oil or grease spills. What can I do in the bathroom?  Use night lights.  Install grab bars by the toilet and in the tub and shower. Do  not use towel bars as grab bars.  Use non-skid mats or decals in the tub or shower.  If you need to sit down in the shower, use a plastic, non-slip stool.  Keep the floor dry. Clean up any water that spills on the floor as soon as it happens.  Remove soap buildup in the tub or shower regularly.  Attach bath mats securely with double-sided non-slip rug tape.  Do not have throw rugs and other things on the floor that can make you trip. What can I do in the bedroom?  Use night lights.  Make sure that you have a light by your bed that is easy to reach.  Do not use any sheets or blankets that are too big for your bed. They should not hang down onto the floor.  Have a firm chair that has side arms. You can use this for support while you get dressed.  Do not have throw rugs and other things on the floor that can make you trip. What can I do in the kitchen?  Clean up any spills right away.  Avoid walking on wet floors.  Keep items that you use a lot in easy-to-reach places.  If you need to reach something  above you, use a strong step stool that has a grab bar.  Keep electrical cords out of the way.  Do not use floor polish or wax that makes floors slippery. If you must use wax, use non-skid floor wax.  Do not have throw rugs and other things on the floor that can make you trip. What can I do with my stairs?  Do not leave any items on the stairs.  Make sure that there are handrails on both sides of the stairs and use them. Fix handrails that are broken or loose. Make sure that handrails are as long as the stairways.  Check any carpeting to make sure that it is firmly attached to the stairs. Fix any carpet that is loose or worn.  Avoid having throw rugs at the top or bottom of the stairs. If you do have throw rugs, attach them to the floor with carpet tape.  Make sure that you have a light switch at the top of the stairs and the bottom of the stairs. If you do not have them,  ask someone to add them for you. What else can I do to help prevent falls?  Wear shoes that:  Do not have high heels.  Have rubber bottoms.  Are comfortable and fit you well.  Are closed at the toe. Do not wear sandals.  If you use a stepladder:  Make sure that it is fully opened. Do not climb a closed stepladder.  Make sure that both sides of the stepladder are locked into place.  Ask someone to hold it for you, if possible.  Clearly mark and make sure that you can see:  Any grab bars or handrails.  First and last steps.  Where the edge of each step is.  Use tools that help you move around (mobility aids) if they are needed. These include:  Canes.  Walkers.  Scooters.  Crutches.  Turn on the lights when you go into a dark area. Replace any light bulbs as soon as they burn out.  Set up your furniture so you have a clear path. Avoid moving your furniture around.  If any of your floors are uneven, fix them.  If there are any pets around you, be aware of where they are.  Review your medicines with your doctor. Some medicines can make you feel dizzy. This can increase your chance of falling. Ask your doctor what other things that you can do to help prevent falls. This information is not intended to replace advice given to you by your health care provider. Make sure you discuss any questions you have with your health care provider. Document Released: 09/16/2009 Document Revised: 04/27/2016 Document Reviewed: 12/25/2014 Elsevier Interactive Patient Education  2017 [employer].

## 2020-09-06 ENCOUNTER — Ambulatory Visit (INDEPENDENT_AMBULATORY_CARE_PROVIDER_SITE_OTHER): Payer: Medicare HMO | Admitting: Physician Assistant

## 2020-09-06 ENCOUNTER — Encounter: Payer: Self-pay | Admitting: Physician Assistant

## 2020-09-06 ENCOUNTER — Other Ambulatory Visit: Payer: Self-pay

## 2020-09-06 VITALS — BP 155/55 | HR 65 | Temp 98.4°F | Resp 20 | Ht 65.5 in | Wt 188.0 lb

## 2020-09-06 DIAGNOSIS — M5136 Other intervertebral disc degeneration, lumbar region: Secondary | ICD-10-CM

## 2020-09-06 DIAGNOSIS — M25552 Pain in left hip: Secondary | ICD-10-CM

## 2020-09-06 DIAGNOSIS — M1610 Unilateral primary osteoarthritis, unspecified hip: Secondary | ICD-10-CM | POA: Diagnosis not present

## 2020-09-06 NOTE — Progress Notes (Signed)
Established patient visit   Patient: Stacey Pierce   DOB: July 09, 1941   79 y.o. Female  MRN: [id number] Visit Date: 09/06/2020  Today's healthcare provider: Trinna Post, PA-C   Chief Complaint  Patient presents with  . Leg Pain   Subjective    Leg Pain  There was no injury mechanism. The pain is present in the left leg and left ankle. The quality of the pain is described as aching, shooting and stabbing. The pain is moderate (was severe). Associated symptoms include an inability to bear weight. Pertinent negatives include no numbness. The symptoms are aggravated by movement and weight bearing. She has tried acetaminophen for the symptoms.    She reports she had left leg pain that started on Thursday 09/02/2020 that was worse going down stairs. She reports pain from her hip travelling down her left knee and into her foot. She denies injuries. She denies prosthetic joints. She has used some voltaren gel which she doesn't think was helpful. She has difficulty getting up from a sitting position after a period of inactivity. She reports a limp due to pain. Lumbar spine and right hip xrays from 07/2019 show arthritis and lumbar and bilateral hip xray from 2018 showed the same. She denies swelling, erythema of the leg.      Medications: Outpatient Medications Prior to Visit  Medication Sig  . acetaminophen (TYLENOL) 500 MG tablet Take 1,000 mg by mouth every 6 (six) hours as needed for mild pain.  Marland Kitchen amitriptyline (ELAVIL) 10 MG tablet Take 10 mg by mouth at bedtime.  Marland Kitchen aspirin EC 81 MG tablet Take 81 mg by mouth daily.  Marland Kitchen atorvastatin (LIPITOR) 40 MG tablet TAKE 1 TABLET (40 MG TOTAL) BY MOUTH DAILY.  . BD INSULIN SYRINGE U/F 31G X 5/16" 1 ML MISC 4 (four) times daily.  . carvedilol (COREG) 25 MG tablet TAKE 1 TABLET BY MOUTH 2 TIMES DAILY WITH A MEAL.  . DiphenhydrAMINE HCl ([facility] ALLERGY PO) Take 1 tablet by mouth daily.  . dorzolamide-timolol (COSOPT) 22.3-6.8 MG/ML ophthalmic  solution Place 1 drop into both eyes 2 (two) times daily.  Marland Kitchen glucose blood test strip Use three test strips daily as instructed.  . hydrALAZINE (APRESOLINE) 25 MG tablet Take 1 tablet (25 mg total) by mouth 2 times daily at 12 noon and 4 pm.  . insulin aspart (NOVOLOG) 100 UNIT/ML injection Inject 20-25 Units into the skin 2 (two) times daily before a meal. Takes 20 units before breakfast and 25 units before dinner.  . insulin detemir (LEVEMIR) 100 UNIT/ML injection Inject 20 units into the skin in the morning and 55 units at bedtime  . latanoprost (XALATAN) 0.005 % ophthalmic solution Place 1 drop into both eyes at bedtime.   Marland Kitchen levothyroxine (SYNTHROID) 112 MCG tablet TAKE 1 TABLET (112 MCG TOTAL) BY MOUTH DAILY BEFORE BREAKFAST.  Marland Kitchen losartan (COZAAR) 100 MG tablet TAKE 1 TABLET BY MOUTH EVERY DAY  . metFORMIN (GLUCOPHAGE) 1000 MG tablet Take 2,000 mg by mouth daily after supper.  Marland Kitchen amLODipine (NORVASC) 5 MG tablet TAKE 1 TABLET (5 MG TOTAL) BY MOUTH DAILY. (Patient not taking: Reported on 09/02/2020)  . clindamycin (CLEOCIN) 300 MG capsule Take 2 capsules (600 mg) one hour prior to all dental visits. (Patient not taking: Reported on 01/29/2020)  . gabapentin (NEURONTIN) 300 MG capsule Take 2 capsules by mouth.   . pantoprazole (PROTONIX) 40 MG tablet TAKE 1 TABLET (40 MG TOTAL) BY MOUTH DAILY AT 12 NOON. (  Patient not taking: Reported on 08/10/2020)   No facility-administered medications prior to visit.    Review of Systems  Constitutional: Positive for activity change.  Cardiovascular: Negative for chest pain, palpitations and leg swelling.  Musculoskeletal: Positive for arthralgias, back pain, gait problem and myalgias.  Neurological: Negative for dizziness, weakness and numbness.  Hematological: Does not bruise/bleed easily.      Objective    BP (!) 155/55 (BP Location: Right Arm)   Pulse 65   Temp 98.4 F (36.9 C)   Resp 20   Ht 5' 5.5" (1.664 m)   Wt 188 lb (85.3 kg)   BMI 30.81  kg/m    Physical Exam Constitutional:      Appearance: Normal appearance.  Cardiovascular:     Rate and Rhythm: Normal rate and regular rhythm.     Heart sounds: Normal heart sounds.  Pulmonary:     Effort: Pulmonary effort is normal.     Breath sounds: Normal breath sounds.  Musculoskeletal:     Right knee: Normal. No swelling or deformity.     Left knee: Normal. No swelling or deformity.     Right lower leg: Normal. No edema.     Left lower leg: Normal. No edema.  Skin:    General: Skin is warm and dry.  Neurological:     Mental Status: She is alert and oriented to person, place, and time. Mental status is at baseline.  Psychiatric:        Mood and Affect: Mood normal.        Behavior: Behavior normal.       No results found for any visits on 09/06/20.  Assessment & Plan    1. Left hip pain  Described chronicity of arthritis and OTC interventions including tylenol, icy hot. Also counseled on interventional options including steroid injections, nerve ablations. Patient is interested in this and will refer to physiatry.   - Ambulatory referral to Orthopedics  2. DDD (degenerative disc disease), lumbar  - Ambulatory referral to Orthopedics  3. Arthritis, hip  - Ambulatory referral to Orthopedics    No follow-ups on file.      ITrinna Post, PA-C, have reviewed all documentation for this visit. The documentation on 09/06/20 for the exam, diagnosis, procedures, and orders are all accurate and complete.    Paulene Floor  [facility] [phone] (phone) [phone] (fax)  [place]

## 2020-09-06 NOTE — Patient Instructions (Signed)
Arthritis Arthritis means joint pain. It can also mean joint disease. A joint is a place where bones come together. There are more than 100 types of arthritis. What are the causes? This condition may be caused by:  Wear and tear of a joint. This is the most common cause.  A lot of acid in the blood, which leads to pain in the joint (gout).  Pain and swelling (inflammation) in a joint.  Infection of a joint.  Injuries in the joint.  A reaction to medicines (allergy). In some cases, the cause may not be known. What are the signs or symptoms? Symptoms of this condition include:  Redness at a joint.  Swelling at a joint.  Stiffness at a joint.  Warmth coming from the joint.  A fever.  A feeling of being sick. How is this treated? This condition may be treated with:  Treating the cause, if it is known.  Rest.  Raising (elevating) the joint.  Putting cold or hot packs on the joint.  Medicines to treat symptoms and reduce pain and swelling.  Shots of medicines (cortisone) into the joint. You may also be told to make changes in your life, such as doing exercises and losing weight. Follow these instructions at home: Medicines  Take over-the-counter and prescription medicines only as told by your doctor.  Do not take aspirin for pain if your doctor says that you may have gout. Activity  Rest your joint if your doctor tells you to.  Avoid activities that make the pain worse.  Exercise your joint regularly as told by your doctor. Try doing exercises like: ? Swimming. ? Water aerobics. ? Biking. ? Walking. Managing pain, stiffness, and swelling      If told, put ice on the affected area. ? Put ice in a plastic bag. ? Place a towel between your skin and the bag. ? Leave the ice on for 20 minutes, 2-3 times per day.  If your joint is swollen, raise (elevate) it above the level of your heart if told by your doctor.  If your joint feels stiff in the morning,  try taking a warm shower.  If told, put heat on the affected area. Do this as often as told by your doctor. Use the heat source that your doctor recommends, such as a moist heat pack or a heating pad. If you have diabetes, do not apply heat without asking your doctor. To apply heat: ? Place a towel between your skin and the heat source. ? Leave the heat on for 20-30 minutes. ? Remove the heat if your skin turns bright red. This is very important if you are unable to feel pain, heat, or cold. You may have a greater risk of getting burned. General instructions  Do not use any products that contain nicotine or tobacco, such as cigarettes, e-cigarettes, and chewing tobacco. If you need help quitting, ask your doctor.  Keep all follow-up visits as told by your doctor. This is important. Contact a doctor if:  The pain gets worse.  You have a fever. Get help right away if:  You have very bad pain in your joint.  You have swelling in your joint.  Your joint is red.  Many joints become painful and swollen.  You have very bad back pain.  Your leg is very weak.  You cannot control your pee (urine) or poop (stool). Summary  Arthritis means joint pain. It can also mean joint disease. A joint is a place   where bones come together.  The most common cause of this condition is wear and tear of a joint.  Symptoms of this condition include redness, swelling, or stiffness of the joint.  This condition is treated with rest, raising the joint, medicines, and putting cold or hot packs on the joint.  Follow your doctor's instructions about medicines, activity, exercises, and other home care treatments. This information is not intended to replace advice given to you by your health care provider. Make sure you discuss any questions you have with your health care provider. Document Revised: 10/28/2018 Document Reviewed: 10/28/2018 Elsevier Patient Education  2020 [employer] Inc[employer]  

## 2020-09-09 DIAGNOSIS — E1165 Type 2 diabetes mellitus with hyperglycemia: Secondary | ICD-10-CM | POA: Diagnosis not present

## 2020-09-16 DIAGNOSIS — M25562 Pain in left knee: Secondary | ICD-10-CM | POA: Diagnosis not present

## 2020-09-16 DIAGNOSIS — M25552 Pain in left hip: Secondary | ICD-10-CM | POA: Diagnosis not present

## 2020-09-23 DIAGNOSIS — R69 Illness, unspecified: Secondary | ICD-10-CM | POA: Diagnosis not present

## 2020-10-04 ENCOUNTER — Other Ambulatory Visit: Payer: Self-pay | Admitting: Family Medicine

## 2020-10-04 NOTE — Telephone Encounter (Signed)
Requested Prescriptions  Pending Prescriptions Disp Refills  . losartan (COZAAR) 100 MG tablet [Pharmacy Med Name: LOSARTAN POTASSIUM 100 MG TAB] 90 tablet 1    Sig: TAKE 1 TABLET BY MOUTH EVERY DAY     Cardiovascular:  Angiotensin Receptor Blockers Failed - 10/04/2020  1:21 AM      Failed - Cr in normal range and within 180 days    Creat  Date Value Ref Range Status  06/08/2015 1.13 (H) 0.50 - 1.10 mg/dL Final   Creatinine, Ser  Date Value Ref Range Status  08/27/2020 1.36 (H) 0.57 - 1.00 mg/dL Final         Failed - Last BP in normal range    BP Readings from Last 1 Encounters:  09/06/20 (!) 155/55         Passed - K in normal range and within 180 days    Potassium  Date Value Ref Range Status  08/27/2020 4.6 3.5 - 5.2 mmol/L Final         Passed - Patient is not pregnant      Passed - Valid encounter within last 6 months    Recent Outpatient Visits          4 weeks ago Left hip pain   [facility] [place] Kaylor, Wendee Beavers, PA-C   1 month ago Annual physical exam   [facility] Jerrol Banana., MD   1 month ago Benign essential HTN   [facility] Jerrol Banana., MD   8 months ago Type 2 diabetes mellitus with diabetic nephropathy, unspecified whether long term insulin use [facility])   [facility] Jerrol Banana., MD   1 year ago Annual physical exam   [facility] Jerrol Banana., MD      Future Appointments            In 1 month Jerrol Banana., MD [facility] [place]

## 2020-10-07 DIAGNOSIS — D631 Anemia in chronic kidney disease: Secondary | ICD-10-CM | POA: Diagnosis not present

## 2020-10-07 DIAGNOSIS — N2581 Secondary hyperparathyroidism of renal origin: Secondary | ICD-10-CM | POA: Diagnosis not present

## 2020-10-07 DIAGNOSIS — I129 Hypertensive chronic kidney disease with stage 1 through stage 4 chronic kidney disease, or unspecified chronic kidney disease: Secondary | ICD-10-CM | POA: Diagnosis not present

## 2020-10-07 DIAGNOSIS — N1832 Chronic kidney disease, stage 3b: Secondary | ICD-10-CM | POA: Diagnosis not present

## 2020-10-07 DIAGNOSIS — E1122 Type 2 diabetes mellitus with diabetic chronic kidney disease: Secondary | ICD-10-CM | POA: Diagnosis not present

## 2020-10-07 DIAGNOSIS — R809 Proteinuria, unspecified: Secondary | ICD-10-CM | POA: Diagnosis not present

## 2020-10-10 DIAGNOSIS — E1165 Type 2 diabetes mellitus with hyperglycemia: Secondary | ICD-10-CM | POA: Diagnosis not present

## 2020-10-12 DIAGNOSIS — R809 Proteinuria, unspecified: Secondary | ICD-10-CM | POA: Diagnosis not present

## 2020-10-12 DIAGNOSIS — N2581 Secondary hyperparathyroidism of renal origin: Secondary | ICD-10-CM | POA: Diagnosis not present

## 2020-10-12 DIAGNOSIS — D631 Anemia in chronic kidney disease: Secondary | ICD-10-CM | POA: Diagnosis not present

## 2020-10-12 DIAGNOSIS — N1832 Chronic kidney disease, stage 3b: Secondary | ICD-10-CM | POA: Diagnosis not present

## 2020-10-12 DIAGNOSIS — E1122 Type 2 diabetes mellitus with diabetic chronic kidney disease: Secondary | ICD-10-CM | POA: Diagnosis not present

## 2020-10-12 DIAGNOSIS — I129 Hypertensive chronic kidney disease with stage 1 through stage 4 chronic kidney disease, or unspecified chronic kidney disease: Secondary | ICD-10-CM | POA: Diagnosis not present

## 2020-10-13 DIAGNOSIS — I350 Nonrheumatic aortic (valve) stenosis: Secondary | ICD-10-CM | POA: Diagnosis not present

## 2020-10-13 DIAGNOSIS — I2510 Atherosclerotic heart disease of native coronary artery without angina pectoris: Secondary | ICD-10-CM | POA: Diagnosis not present

## 2020-10-13 DIAGNOSIS — E782 Mixed hyperlipidemia: Secondary | ICD-10-CM | POA: Diagnosis not present

## 2020-10-13 DIAGNOSIS — I10 Essential (primary) hypertension: Secondary | ICD-10-CM | POA: Diagnosis not present

## 2020-10-21 DIAGNOSIS — M5416 Radiculopathy, lumbar region: Secondary | ICD-10-CM | POA: Diagnosis not present

## 2020-10-22 ENCOUNTER — Other Ambulatory Visit: Payer: Self-pay | Admitting: Orthopedic Surgery

## 2020-10-22 DIAGNOSIS — M5416 Radiculopathy, lumbar region: Secondary | ICD-10-CM

## 2020-11-04 ENCOUNTER — Encounter: Payer: Self-pay | Admitting: Family Medicine

## 2020-11-04 ENCOUNTER — Ambulatory Visit (INDEPENDENT_AMBULATORY_CARE_PROVIDER_SITE_OTHER): Payer: Medicare HMO | Admitting: Family Medicine

## 2020-11-04 ENCOUNTER — Other Ambulatory Visit: Payer: Self-pay

## 2020-11-04 VITALS — BP 121/77 | HR 62 | Temp 98.1°F | Resp 16 | Wt 193.0 lb

## 2020-11-04 DIAGNOSIS — M545 Low back pain, unspecified: Secondary | ICD-10-CM

## 2020-11-04 DIAGNOSIS — Z952 Presence of prosthetic heart valve: Secondary | ICD-10-CM | POA: Diagnosis not present

## 2020-11-04 DIAGNOSIS — E039 Hypothyroidism, unspecified: Secondary | ICD-10-CM | POA: Diagnosis not present

## 2020-11-04 DIAGNOSIS — I350 Nonrheumatic aortic (valve) stenosis: Secondary | ICD-10-CM | POA: Diagnosis not present

## 2020-11-04 DIAGNOSIS — I2510 Atherosclerotic heart disease of native coronary artery without angina pectoris: Secondary | ICD-10-CM | POA: Diagnosis not present

## 2020-11-04 DIAGNOSIS — I10 Essential (primary) hypertension: Secondary | ICD-10-CM

## 2020-11-04 DIAGNOSIS — Z794 Long term (current) use of insulin: Secondary | ICD-10-CM

## 2020-11-04 DIAGNOSIS — G8929 Other chronic pain: Secondary | ICD-10-CM

## 2020-11-04 DIAGNOSIS — Z6831 Body mass index (BMI) 31.0-31.9, adult: Secondary | ICD-10-CM

## 2020-11-04 DIAGNOSIS — E6609 Other obesity due to excess calories: Secondary | ICD-10-CM

## 2020-11-04 DIAGNOSIS — E11319 Type 2 diabetes mellitus with unspecified diabetic retinopathy without macular edema: Secondary | ICD-10-CM | POA: Diagnosis not present

## 2020-11-04 NOTE — Progress Notes (Signed)
Established patient visit   Patient: Stacey Pierce   DOB: Mar 14, 1941   79 y.o. Female  MRN: [id number] Visit Date: 11/04/2020  Today's healthcare provider: Wilhemena Durie, MD   Chief Complaint  Patient presents with  . Hypertension   Subjective    HPI  Overall patient feels well recently.  Stacey Pierce has had her flu shot but declines Covid vaccine.  She had Covid back in August and is feeling okay.  She is taking her medications as prescribed. She is followed by cardiology and by endocrinology. Hypertension, follow-up  BP Readings from Last 3 Encounters:  11/04/20 121/77  09/06/20 (!) 155/55  08/26/20 (!) 132/57   Wt Readings from Last 3 Encounters:  11/04/20 193 lb (87.5 kg)  09/06/20 188 lb (85.3 kg)  08/26/20 192 lb (87.1 kg)     She was last seen for hypertension 2 months ago.  BP at that visit was 132/57. Management since that visit includes; Stop norvasc and try Hydralazine 25 mg BID RTC 1-2 months. She reports good compliance with treatment. Patient stopped taking Hydralazine because she didn't like taking it twice daily and she reprts it didn't help to lower her blood pressure. Patient says she was seen by her nephrologist and was changed to  She is not having side effects.  She is not exercising. She is adherent to low salt diet.   Outside blood pressures are not checked.  She does not smoke.  Use of agents associated with hypertension: NSAIDS.   ---------------------------------------------------------------------------------------------------   Past Medical History:  Diagnosis Date  . Anemia   . Aortic stenosis, severe    a. 09/2017: s/p TAVR  . CKD (chronic kidney disease)   . Diabetes mellitus, insulin dependent (IDDM), uncontrolled   . DR (diabetic retinopathy) ([place])   . GERD (gastroesophageal reflux disease)   . Glaucoma   . Hyperlipidemia   . Hypertension   . Hypothyroidism   . Obesity   . S/P TAVR (transcatheter aortic valve replacement)  09/04/2017   26 mm Medtronic Evolut Pro transcatheter heart valve placed via percutaneous right transfemoral approach        Medications: Outpatient Medications Prior to Visit  Medication Sig  . acetaminophen (TYLENOL) 500 MG tablet Take 1,000 mg by mouth every 6 (six) hours as needed for mild pain.  Marland Kitchen amitriptyline (ELAVIL) 10 MG tablet Take 10 mg by mouth at bedtime.  Marland Kitchen aspirin EC 81 MG tablet Take 81 mg by mouth daily.  Marland Kitchen atorvastatin (LIPITOR) 40 MG tablet TAKE 1 TABLET (40 MG TOTAL) BY MOUTH DAILY.  . BD INSULIN SYRINGE U/F 31G X 5/16" 1 ML MISC 4 (four) times daily.  . carvedilol (COREG) 25 MG tablet TAKE 1 TABLET BY MOUTH 2 TIMES DAILY WITH A MEAL.  . clindamycin (CLEOCIN) 300 MG capsule Take 2 capsules (600 mg) one hour prior to all dental visits.  . DiphenhydrAMINE HCl ([facility] ALLERGY PO) Take 1 tablet by mouth daily.  . dorzolamide-timolol (COSOPT) 22.3-6.8 MG/ML ophthalmic solution Place 1 drop into both eyes 2 (two) times daily.  Marland Kitchen glucose blood test strip Use three test strips daily as instructed.  . insulin aspart (NOVOLOG) 100 UNIT/ML injection Inject 20-25 Units into the skin 2 (two) times daily before a meal. Takes 20 units before breakfast and 25 units before dinner.  . insulin detemir (LEVEMIR) 100 UNIT/ML injection Inject 20 units into the skin in the morning and 55 units at bedtime  . latanoprost (XALATAN) 0.005 % ophthalmic  solution Place 1 drop into both eyes at bedtime.   Marland Kitchen levothyroxine (SYNTHROID) 112 MCG tablet TAKE 1 TABLET (112 MCG TOTAL) BY MOUTH DAILY BEFORE BREAKFAST.  Marland Kitchen losartan (COZAAR) 100 MG tablet TAKE 1 TABLET BY MOUTH EVERY DAY  . metFORMIN (GLUCOPHAGE) 1000 MG tablet Take 2,000 mg by mouth daily after supper.  . pantoprazole (PROTONIX) 40 MG tablet TAKE 1 TABLET (40 MG TOTAL) BY MOUTH DAILY AT 12 NOON.  Marland Kitchen torsemide (DEMADEX) 10 MG tablet Take 10 mg by mouth daily.  Marland Kitchen gabapentin (NEURONTIN) 300 MG capsule Take 2 capsules by mouth.   . hydrALAZINE  (APRESOLINE) 25 MG tablet Take 1 tablet (25 mg total) by mouth 2 times daily at 12 noon and 4 pm. (Patient not taking: Reported on 11/04/2020)  . [DISCONTINUED] amLODipine (NORVASC) 5 MG tablet TAKE 1 TABLET (5 MG TOTAL) BY MOUTH DAILY. (Patient not taking: Reported on 09/02/2020)   No facility-administered medications prior to visit.    Review of Systems  Constitutional: Negative for appetite change, chills, fatigue and fever.  Respiratory: Negative for chest tightness and shortness of breath.   Cardiovascular: Negative for chest pain and palpitations.  Gastrointestinal: Negative for abdominal pain, nausea and vomiting.  Musculoskeletal: Positive for arthralgias and back pain.  Neurological: Negative for dizziness and weakness.       Objective    BP 121/77 (BP Location: Left Arm, Patient Position: Sitting, Cuff Size: Normal)   Pulse 62   Temp 98.1 F (36.7 C) (Oral)   Resp 16   Wt 193 lb (87.5 kg)   BMI 31.63 kg/m  BP Readings from Last 3 Encounters:  11/04/20 121/77  09/06/20 (!) 155/55  08/26/20 (!) 132/57   Wt Readings from Last 3 Encounters:  11/04/20 193 lb (87.5 kg)  09/06/20 188 lb (85.3 kg)  08/26/20 192 lb (87.1 kg)      Physical Exam Vitals reviewed.  Constitutional:      General: She is not in acute distress.    Appearance: She is well-developed. She is obese. She is not diaphoretic.  HENT:     Head: Normocephalic and atraumatic.  Eyes:     General: No scleral icterus.    Conjunctiva/sclera: Conjunctivae normal.  Neck:     Thyroid: No thyromegaly.  Cardiovascular:     Rate and Rhythm: Normal rate and regular rhythm.     Pulses: Normal pulses.     Heart sounds: Normal heart sounds. No murmur heard.   Pulmonary:     Effort: Pulmonary effort is normal. No respiratory distress.     Breath sounds: Normal breath sounds. No wheezing, rhonchi or rales.  Musculoskeletal:     Cervical back: Neck supple.     Right lower leg: No edema.     Left lower leg: No  edema.  Lymphadenopathy:     Cervical: No cervical adenopathy.  Skin:    General: Skin is warm and dry.     Findings: No rash.  Neurological:     General: No focal deficit present.     Mental Status: She is alert and oriented to person, place, and time.  Psychiatric:        Mood and Affect: Mood normal.        Behavior: Behavior normal.        Thought Content: Thought content normal.        Judgment: Judgment normal.       No results found for any visits on 11/04/20.  Assessment & Plan  1. Arteriosclerosis of coronary artery All risk factors treated  2. Benign essential HTN Good control.  3. Nonrheumatic aortic valve stenosis Status post TAVR  4. Type 2 diabetes mellitus with retinopathy, with long-term current use of insulin, macular edema presence unspecified, unspecified laterality, unspecified retinopathy severity [facility]) Per endocrinology  5. Adult hypothyroidism   6. S/P TAVR (transcatheter aortic valve replacement)   7. Chronic right-sided low back pain without sciatica   8. Class 1 obesity due to excess calories with serious comorbidity and body mass index (BMI) of 31.0 to 31.9 in adult With hypertension CAD hyper lipidemia diabetes COVID vaccine recommended.  No follow-ups on file.         Mahayla Haddaway Cranford Mon, MD  [facility] [phone] (phone) [phone] (fax)  [place]

## 2020-11-05 DIAGNOSIS — Z794 Long term (current) use of insulin: Secondary | ICD-10-CM | POA: Diagnosis not present

## 2020-11-05 DIAGNOSIS — E11319 Type 2 diabetes mellitus with unspecified diabetic retinopathy without macular edema: Secondary | ICD-10-CM | POA: Diagnosis not present

## 2020-11-06 ENCOUNTER — Other Ambulatory Visit: Payer: Self-pay

## 2020-11-06 ENCOUNTER — Ambulatory Visit
Admission: RE | Admit: 2020-11-06 | Discharge: 2020-11-06 | Disposition: A | Discharge status: Home / Self Care | Payer: Medicare HMO | Source: Ambulatory Visit | Attending: Orthopedic Surgery | Admitting: Orthopedic Surgery

## 2020-11-06 DIAGNOSIS — M5126 Other intervertebral disc displacement, lumbar region: Secondary | ICD-10-CM | POA: Diagnosis not present

## 2020-11-06 DIAGNOSIS — M5416 Radiculopathy, lumbar region: Secondary | ICD-10-CM | POA: Diagnosis not present

## 2020-11-10 ENCOUNTER — Other Ambulatory Visit: Payer: Self-pay | Admitting: Family Medicine

## 2020-11-10 DIAGNOSIS — E039 Hypothyroidism, unspecified: Secondary | ICD-10-CM

## 2020-11-11 ENCOUNTER — Other Ambulatory Visit: Payer: Self-pay | Admitting: Obstetrics and Gynecology

## 2020-11-11 DIAGNOSIS — Z124 Encounter for screening for malignant neoplasm of cervix: Secondary | ICD-10-CM | POA: Diagnosis not present

## 2020-11-11 DIAGNOSIS — Z1231 Encounter for screening mammogram for malignant neoplasm of breast: Secondary | ICD-10-CM | POA: Diagnosis not present

## 2020-11-12 DIAGNOSIS — E1129 Type 2 diabetes mellitus with other diabetic kidney complication: Secondary | ICD-10-CM | POA: Diagnosis not present

## 2020-11-12 DIAGNOSIS — E1142 Type 2 diabetes mellitus with diabetic polyneuropathy: Secondary | ICD-10-CM | POA: Diagnosis not present

## 2020-11-12 DIAGNOSIS — R809 Proteinuria, unspecified: Secondary | ICD-10-CM | POA: Diagnosis not present

## 2020-11-12 DIAGNOSIS — E1169 Type 2 diabetes mellitus with other specified complication: Secondary | ICD-10-CM | POA: Diagnosis not present

## 2020-11-12 DIAGNOSIS — Z794 Long term (current) use of insulin: Secondary | ICD-10-CM | POA: Diagnosis not present

## 2020-11-12 DIAGNOSIS — Z7185 Encounter for immunization safety counseling: Secondary | ICD-10-CM | POA: Diagnosis not present

## 2020-11-12 DIAGNOSIS — E785 Hyperlipidemia, unspecified: Secondary | ICD-10-CM | POA: Diagnosis not present

## 2020-11-12 DIAGNOSIS — E11319 Type 2 diabetes mellitus with unspecified diabetic retinopathy without macular edema: Secondary | ICD-10-CM | POA: Diagnosis not present

## 2020-11-12 DIAGNOSIS — N1832 Chronic kidney disease, stage 3b: Secondary | ICD-10-CM | POA: Diagnosis not present

## 2020-11-12 DIAGNOSIS — E1122 Type 2 diabetes mellitus with diabetic chronic kidney disease: Secondary | ICD-10-CM | POA: Diagnosis not present

## 2020-11-15 ENCOUNTER — Ambulatory Visit
Admission: RE | Admit: 2020-11-15 | Discharge: 2020-11-15 | Disposition: A | Discharge status: Home / Self Care | Payer: Medicare HMO | Source: Ambulatory Visit | Attending: Obstetrics and Gynecology | Admitting: Obstetrics and Gynecology

## 2020-11-15 ENCOUNTER — Other Ambulatory Visit: Payer: Self-pay

## 2020-11-15 DIAGNOSIS — Z1231 Encounter for screening mammogram for malignant neoplasm of breast: Secondary | ICD-10-CM | POA: Insufficient documentation

## 2020-11-16 DIAGNOSIS — R69 Illness, unspecified: Secondary | ICD-10-CM | POA: Diagnosis not present

## 2020-12-04 IMAGING — MG DIGITAL SCREENING BILATERAL MAMMOGRAM WITH TOMO AND CAD
8 series · 8 of 24 positions shown · non-contrast
Comparison: Previous exam(s).

CLINICAL DATA: Screening.

EXAM:
DIGITAL SCREENING BILATERAL MAMMOGRAM WITH TOMO AND CAD

[L MLO synth-2D]
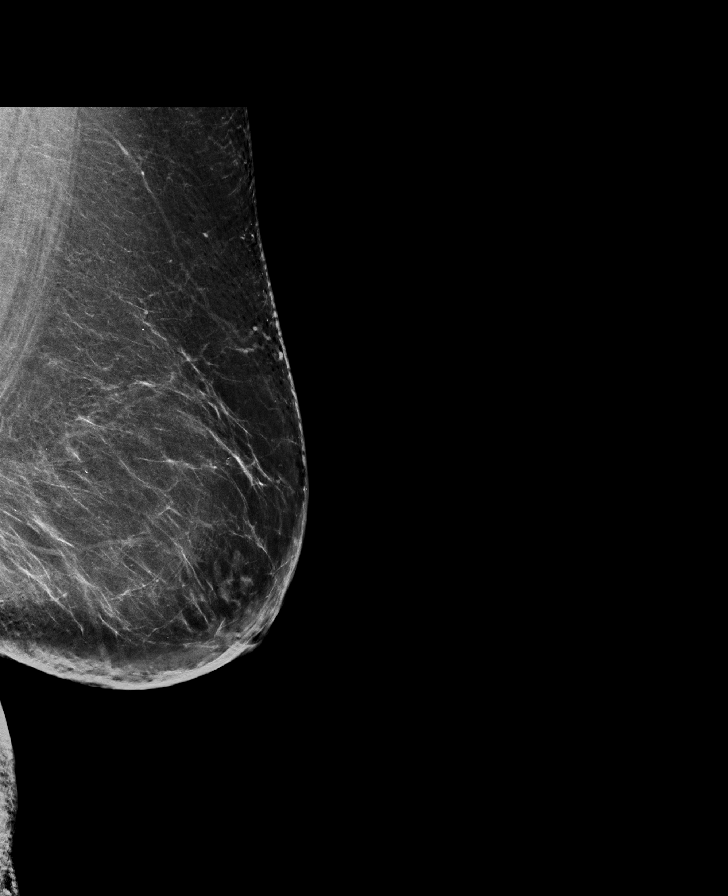

[L CC synth-2D]
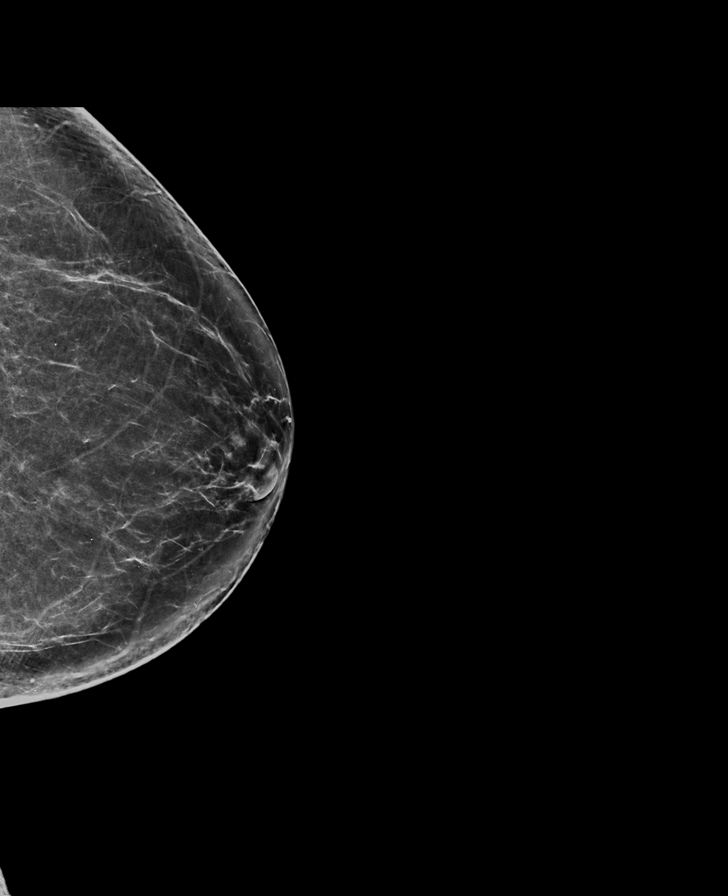

[R CC synth-2D]
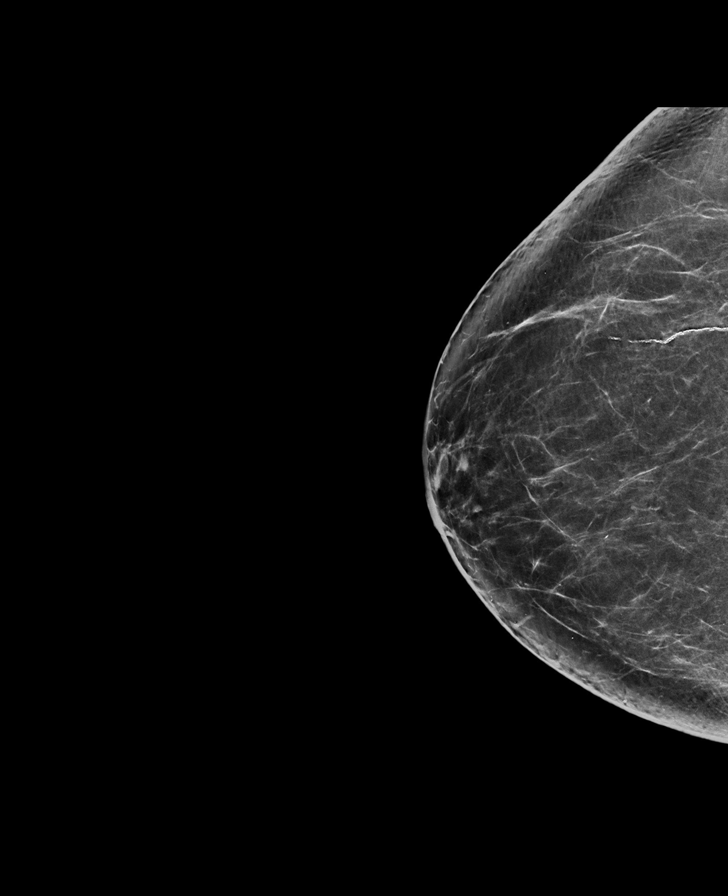

[R MLO synth-2D]
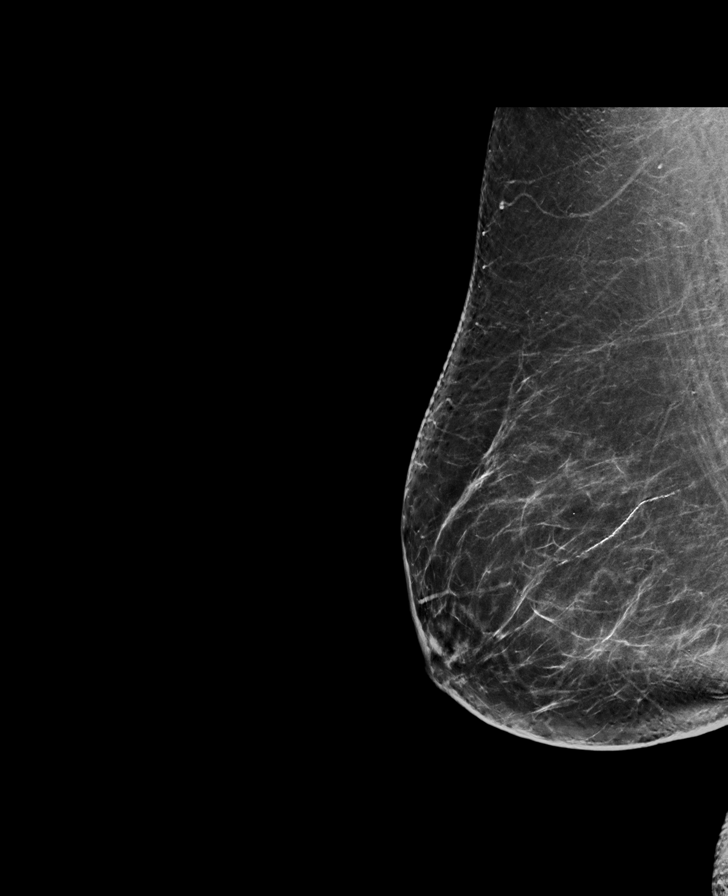

[L MLO tomo · tomo slice 45/89.0]
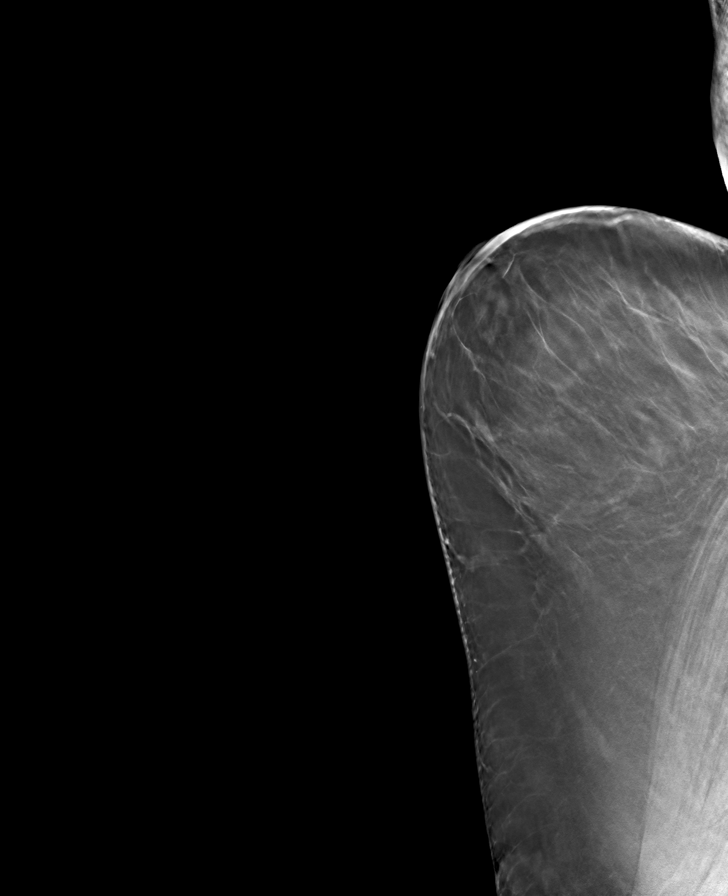

[R MLO tomo · tomo slice 42/83.0]
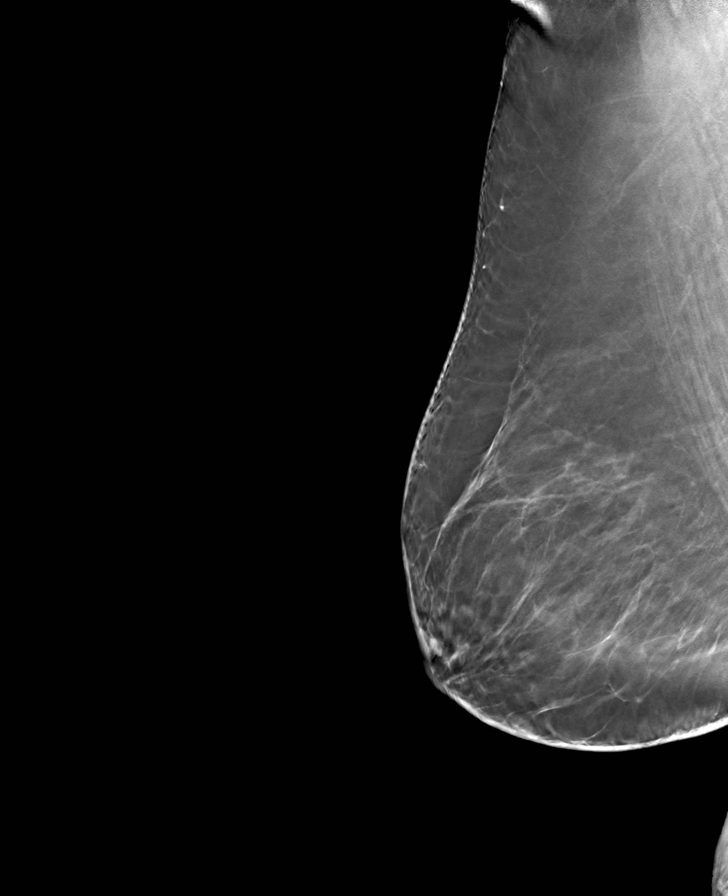

[L CC tomo · tomo slice 39/76.0]
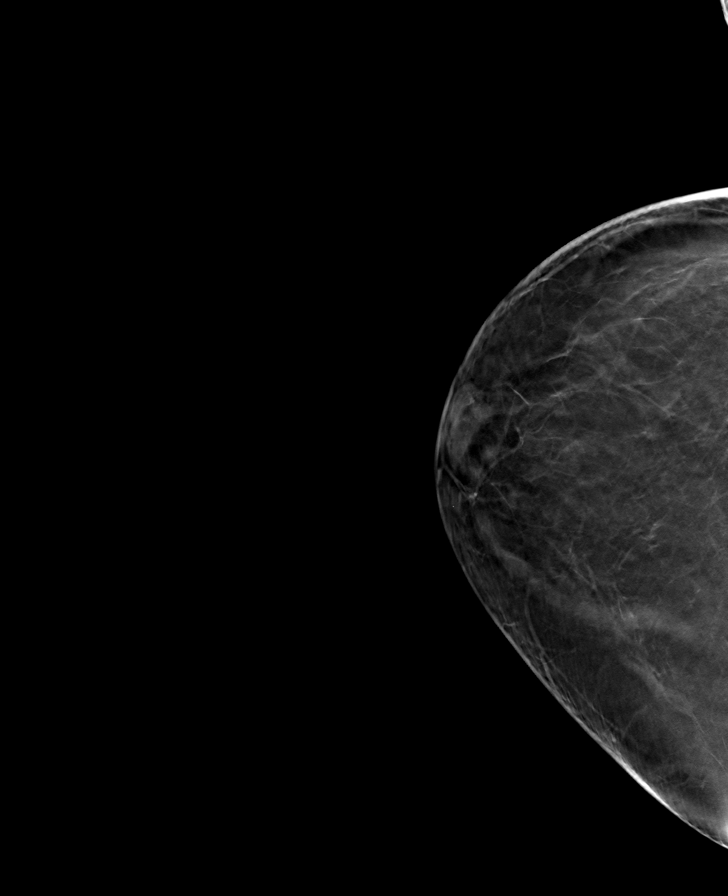

[R CC tomo · tomo slice 39/77.0]
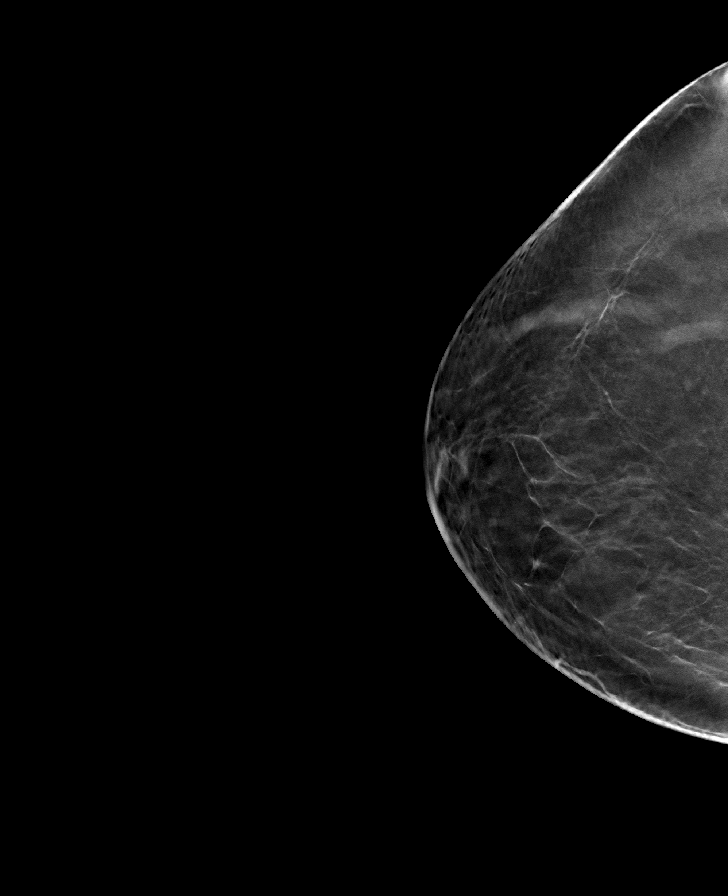

[8 of 24 positions shown; findings below may reference images not displayed]

ACR Breast Density Category b: There are scattered areas of
fibroglandular density.
FINDINGS: There are no findings suspicious for malignancy. Images were
processed with CAD.
IMPRESSION: No mammographic evidence of malignancy. A result letter of this
screening mammogram will be mailed directly to the patient.

RECOMMENDATION:
Screening mammogram in one year. (Code:[id number])

BI-RADS CATEGORY  1: Negative.

## 2020-12-07 DIAGNOSIS — M5126 Other intervertebral disc displacement, lumbar region: Secondary | ICD-10-CM | POA: Diagnosis not present

## 2020-12-07 DIAGNOSIS — M48062 Spinal stenosis, lumbar region with neurogenic claudication: Secondary | ICD-10-CM | POA: Diagnosis not present

## 2020-12-07 DIAGNOSIS — M5416 Radiculopathy, lumbar region: Secondary | ICD-10-CM | POA: Diagnosis not present

## 2020-12-17 DIAGNOSIS — H401131 Primary open-angle glaucoma, bilateral, mild stage: Secondary | ICD-10-CM | POA: Diagnosis not present

## 2021-01-13 DIAGNOSIS — M48062 Spinal stenosis, lumbar region with neurogenic claudication: Secondary | ICD-10-CM | POA: Diagnosis not present

## 2021-01-13 DIAGNOSIS — M5416 Radiculopathy, lumbar region: Secondary | ICD-10-CM | POA: Diagnosis not present

## 2021-02-04 DIAGNOSIS — M5416 Radiculopathy, lumbar region: Secondary | ICD-10-CM | POA: Diagnosis not present

## 2021-02-04 DIAGNOSIS — M4306 Spondylolysis, lumbar region: Secondary | ICD-10-CM | POA: Diagnosis not present

## 2021-02-04 DIAGNOSIS — M5126 Other intervertebral disc displacement, lumbar region: Secondary | ICD-10-CM | POA: Diagnosis not present

## 2021-02-07 ENCOUNTER — Other Ambulatory Visit: Payer: Self-pay | Admitting: Family Medicine

## 2021-02-07 DIAGNOSIS — E039 Hypothyroidism, unspecified: Secondary | ICD-10-CM

## 2021-02-07 NOTE — Telephone Encounter (Signed)
Requested medication (s) are due for refill today:   Yes  Requested medication (s) are on the active medication list:   Yes  Future visit scheduled:   Yes   Last ordered: 11/10/2020 #90, 0 refills  Clinic note:  Returned because protocol failed.   Labs due.   Requested Prescriptions  Pending Prescriptions Disp Refills   levothyroxine (SYNTHROID) 112 MCG tablet [Pharmacy Med Name: LEVOTHYROXINE 112 MCG TABLET] 90 tablet 0    Sig: TAKE 1 TABLET (112 MCG TOTAL) BY MOUTH DAILY BEFORE BREAKFAST.      Endocrinology:  Hypothyroid Agents Failed - 02/07/2021  1:32 AM      Failed - TSH needs to be rechecked within 3 months after an abnormal result. Refill until TSH is due.      Failed - TSH in normal range and within 360 days    TSH  Date Value Ref Range Status  08/27/2020 6.970 (H) 0.450 - 4.500 uIU/mL Final          Passed - Valid encounter within last 12 months    Recent Outpatient Visits           3 months ago Arteriosclerosis of coronary artery   [facility] Jerrol Banana., MD   5 months ago Left hip pain   [facility] Carles Collet M, [place]   5 months ago Annual physical exam   [facility] Jerrol Banana., MD   6 months ago Benign essential HTN   [facility] Jerrol Banana., MD   1 year ago Type 2 diabetes mellitus with diabetic nephropathy, unspecified whether long term insulin use [facility])   [facility] Jerrol Banana., MD       Future Appointments             In 2 months Jerrol Banana., MD [facility]

## 2021-02-08 ENCOUNTER — Ambulatory Visit (INDEPENDENT_AMBULATORY_CARE_PROVIDER_SITE_OTHER): Payer: HMO | Admitting: Family Medicine

## 2021-02-08 ENCOUNTER — Other Ambulatory Visit: Payer: Self-pay

## 2021-02-08 DIAGNOSIS — J069 Acute upper respiratory infection, unspecified: Secondary | ICD-10-CM

## 2021-02-08 DIAGNOSIS — J440 Chronic obstructive pulmonary disease with acute lower respiratory infection: Secondary | ICD-10-CM | POA: Diagnosis not present

## 2021-02-08 DIAGNOSIS — J209 Acute bronchitis, unspecified: Secondary | ICD-10-CM | POA: Diagnosis not present

## 2021-02-08 DIAGNOSIS — R059 Cough, unspecified: Secondary | ICD-10-CM

## 2021-02-08 MED ORDER — PREDNISONE 20 MG PO TABS: 20.0000 mg | ORAL_TABLET | Freq: Every day | ORAL | 1 refills | Status: DC

## 2021-02-08 MED ORDER — DOXYCYCLINE HYCLATE 100 MG PO TABS: 100.0000 mg | ORAL_TABLET | Freq: Two times a day (BID) | ORAL | 0 refills | Status: DC

## 2021-02-08 NOTE — Progress Notes (Signed)
Virtual telephone visit    Virtual Visit via Telephone Note   This visit type was conducted due to national recommendations for restrictions regarding the COVID-19 Pandemic (e.g. social distancing) in an effort to limit this patient's exposure and mitigate transmission in our community. Due to her co-morbid illnesses, this patient is at least at moderate risk for complications without adequate follow up. This format is felt to be most appropriate for this patient at this time. The patient did not have access to video technology or had technical difficulties with video requiring transitioning to audio format only (telephone). Physical exam was limited to content and character of the telephone converstion.    Patient location: Home Provider location: Office  I discussed the limitations of evaluation and management by telemedicine and the availability of in person appointments. The patient expressed understanding and agreed to proceed.   Visit Date: 02/08/2021  Today's healthcare provider: Wilhemena Durie, MD   No chief complaint on file.  Subjective    HPI  Patient is an unvaccinated lady who had Covid in August 2021.  3 weeks ago she was exposed to her granddaughter with an upper respiratory infection 2 weeks ago developed cough after starting with a sore throat.  The cough has been unabated since then but no headache myalgias fever or shortness of breath.       Medications: Outpatient Medications Prior to Visit  Medication Sig  . acetaminophen (TYLENOL) 500 MG tablet Take 1,000 mg by mouth every 6 (six) hours as needed for mild pain.  Marland Kitchen amitriptyline (ELAVIL) 10 MG tablet Take 10 mg by mouth at bedtime.  Marland Kitchen aspirin EC 81 MG tablet Take 81 mg by mouth daily.  Marland Kitchen atorvastatin (LIPITOR) 40 MG tablet TAKE 1 TABLET (40 MG TOTAL) BY MOUTH DAILY.  . BD INSULIN SYRINGE U/F 31G X 5/16" 1 ML MISC 4 (four) times daily.  . carvedilol (COREG) 25 MG tablet TAKE 1 TABLET BY MOUTH 2 TIMES  DAILY WITH A MEAL.  . clindamycin (CLEOCIN) 300 MG capsule Take 2 capsules (600 mg) one hour prior to all dental visits.  . DiphenhydrAMINE HCl ([facility] ALLERGY PO) Take 1 tablet by mouth daily.  . dorzolamide-timolol (COSOPT) 22.3-6.8 MG/ML ophthalmic solution Place 1 drop into both eyes 2 (two) times daily.  Marland Kitchen gabapentin (NEURONTIN) 300 MG capsule Take 2 capsules by mouth.   Marland Kitchen glucose blood test strip Use three test strips daily as instructed.  . hydrALAZINE (APRESOLINE) 25 MG tablet Take 1 tablet (25 mg total) by mouth 2 times daily at 12 noon and 4 pm. (Patient not taking: Reported on 11/04/2020)  . insulin aspart (NOVOLOG) 100 UNIT/ML injection Inject 20-25 Units into the skin 2 (two) times daily before a meal. Takes 20 units before breakfast and 25 units before dinner.  . insulin detemir (LEVEMIR) 100 UNIT/ML injection Inject 20 units into the skin in the morning and 55 units at bedtime  . latanoprost (XALATAN) 0.005 % ophthalmic solution Place 1 drop into both eyes at bedtime.   Marland Kitchen levothyroxine (SYNTHROID) 112 MCG tablet TAKE 1 TABLET (112 MCG TOTAL) BY MOUTH DAILY BEFORE BREAKFAST.  Marland Kitchen losartan (COZAAR) 100 MG tablet TAKE 1 TABLET BY MOUTH EVERY DAY  . metFORMIN (GLUCOPHAGE) 1000 MG tablet Take 2,000 mg by mouth daily after supper.  . pantoprazole (PROTONIX) 40 MG tablet TAKE 1 TABLET (40 MG TOTAL) BY MOUTH DAILY AT 12 NOON.  Marland Kitchen torsemide (DEMADEX) 10 MG tablet Take 10 mg by mouth daily.  No facility-administered medications prior to visit.    Review of Systems     Objective    There were no vitals taken for this visit.      Assessment & Plan     1. Cough Check in the parking lot drive-through testing for Covid - Novel Coronavirus, NAA ([facility])  2. Viral upper respiratory tract infection It is likely viral but prednisone 20 mg daily for 5 days will help.  She has tolerated this in the past. - Novel Coronavirus, NAA ([facility])  3. Acute bronchitis with COPD ([place]) Doxycycline  for 5 days is probably unnecessary but with 2 weeks of cough we will go ahead and treat this symptomatic   No follow-ups on file.    I discussed the assessment and treatment plan with the patient. The patient was provided an opportunity to ask questions and all were answered. The patient agreed with the plan and demonstrated an understanding of the instructions.   The patient was advised to call back or seek an in-person evaluation if the symptoms worsen or if the condition fails to improve as anticipated.  I provided 11 minutes of non-face-to-face time during this encounter.  I, Wilhemena Durie, MD, have reviewed all documentation for this visit. The documentation on 02/08/21 for the exam, diagnosis, procedures, and orders are all accurate and complete.   Richard Cranford Mon, MD [facility] [phone] (phone) [phone] (fax)  [place]

## 2021-02-08 NOTE — Progress Notes (Signed)
Virtual Visit via Telephone Note  I connected with Stacey Pierce on 02/08/21 at  1:40 PM EST by telephone and verified that I am speaking with the correct person using two identifiers.  Location: Patient: home Provider: office   I discussed the limitations, risks, security and privacy concerns of performing an evaluation and management service by telephone and the availability of in person appointments. I also discussed with the patient that there may be a patient responsible charge related to this service. The patient expressed understanding and agreed to proceed.   History of Present Illness: Patient is an unvaccinated patient for Covid who had Covid back in August of last year.  She now presents after being exposed to her granddaughter few weeks ago she has 2 weeks of sore throat followed by a cough that would not go away.  No fever myalgias headache or shortness of breath.  Cough is keeping her awake at night.   Observations/Objective: She is able to answer questions easily and she is alert and oriented on the phone.  No shortness of breath and no obvious wheezing.  Assessment and Plan: 1. Cough Check Covid test later today in the parking lot. He certainly could have an endocrine variant and the patient was unvaccinated. - Novel Coronavirus, NAA ([facility])  2. Viral upper respiratory tract infection  - Novel Coronavirus, NAA ([facility])  3. Acute bronchitis with COPD ([place]) With 2 weeks duration will treat with doxycycline and prednisone although the antibiotic might not be necessary. She states she has done well with prednisone in the past despite her diabetes  Follow Up Instructions:    I discussed the assessment and treatment plan with the patient. The patient was provided an opportunity to ask questions and all were answered. The patient agreed with the plan and demonstrated an understanding of the instructions.   The patient was advised to call back or seek an in-person  evaluation if the symptoms worsen or if the condition fails to improve as anticipated.  I provided 11 minutes of non-face-to-face time during this encounter.   Wilhemena Durie, MD

## 2021-02-21 ENCOUNTER — Other Ambulatory Visit: Payer: Self-pay | Admitting: Family Medicine

## 2021-02-21 DIAGNOSIS — I10 Essential (primary) hypertension: Secondary | ICD-10-CM

## 2021-02-25 DIAGNOSIS — M47816 Spondylosis without myelopathy or radiculopathy, lumbar region: Secondary | ICD-10-CM | POA: Diagnosis not present

## 2021-03-04 DIAGNOSIS — E1142 Type 2 diabetes mellitus with diabetic polyneuropathy: Secondary | ICD-10-CM | POA: Diagnosis not present

## 2021-03-11 DIAGNOSIS — E1142 Type 2 diabetes mellitus with diabetic polyneuropathy: Secondary | ICD-10-CM | POA: Diagnosis not present

## 2021-03-11 DIAGNOSIS — E1169 Type 2 diabetes mellitus with other specified complication: Secondary | ICD-10-CM | POA: Diagnosis not present

## 2021-03-11 DIAGNOSIS — Z794 Long term (current) use of insulin: Secondary | ICD-10-CM | POA: Diagnosis not present

## 2021-03-11 DIAGNOSIS — E1122 Type 2 diabetes mellitus with diabetic chronic kidney disease: Secondary | ICD-10-CM | POA: Diagnosis not present

## 2021-03-11 DIAGNOSIS — R809 Proteinuria, unspecified: Secondary | ICD-10-CM | POA: Diagnosis not present

## 2021-03-11 DIAGNOSIS — E1129 Type 2 diabetes mellitus with other diabetic kidney complication: Secondary | ICD-10-CM | POA: Diagnosis not present

## 2021-03-11 DIAGNOSIS — E11319 Type 2 diabetes mellitus with unspecified diabetic retinopathy without macular edema: Secondary | ICD-10-CM | POA: Diagnosis not present

## 2021-03-11 DIAGNOSIS — M47816 Spondylosis without myelopathy or radiculopathy, lumbar region: Secondary | ICD-10-CM | POA: Diagnosis not present

## 2021-03-11 DIAGNOSIS — E785 Hyperlipidemia, unspecified: Secondary | ICD-10-CM | POA: Diagnosis not present

## 2021-03-11 DIAGNOSIS — N1832 Chronic kidney disease, stage 3b: Secondary | ICD-10-CM | POA: Diagnosis not present

## 2021-03-23 DIAGNOSIS — L570 Actinic keratosis: Secondary | ICD-10-CM | POA: Diagnosis not present

## 2021-03-23 DIAGNOSIS — L821 Other seborrheic keratosis: Secondary | ICD-10-CM | POA: Diagnosis not present

## 2021-04-01 ENCOUNTER — Other Ambulatory Visit: Payer: Self-pay | Admitting: Family Medicine

## 2021-04-01 NOTE — Telephone Encounter (Signed)
Most recent Potassium 04/01/21 4.7 Approved per protocol.  Requested Prescriptions  Pending Prescriptions Disp Refills  . losartan (COZAAR) 100 MG tablet [Pharmacy Med Name: LOSARTAN POTASSIUM 100 MG TAB] 90 tablet 0    Sig: TAKE 1 TABLET BY MOUTH EVERY DAY     Cardiovascular:  Angiotensin Receptor Blockers Failed - 04/01/2021  1:30 AM      Failed - Cr in normal range and within 180 days    Creat  Date Value Ref Range Status  06/08/2015 1.13 (H) 0.50 - 1.10 mg/dL Final   Creatinine, Ser  Date Value Ref Range Status  08/27/2020 1.36 (H) 0.57 - 1.00 mg/dL Final         Failed - K in normal range and within 180 days    Potassium  Date Value Ref Range Status  08/27/2020 4.6 3.5 - 5.2 mmol/L Final         Passed - Patient is not pregnant      Passed - Last BP in normal range    BP Readings from Last 1 Encounters:  11/04/20 121/77         Passed - Valid encounter within last 6 months    Recent Outpatient Visits          1 month ago Cough   [facility] Jerrol Banana., MD   4 months ago Arteriosclerosis of coronary artery   [facility] Jerrol Banana., MD   6 months ago Left hip pain   [facility] Carles Collet M, [place]   7 months ago Annual physical exam   [facility] Jerrol Banana., MD   7 months ago Benign essential HTN   [facility] Jerrol Banana., MD      Future Appointments            In 1 month Jerrol Banana., MD [facility], [place]

## 2021-04-18 ENCOUNTER — Telehealth: Payer: Self-pay

## 2021-04-18 NOTE — Telephone Encounter (Signed)
Please advise 

## 2021-04-18 NOTE — Telephone Encounter (Signed)
Copied from [place] [id number]. Topic: General - Other >> Apr 18, 2021  1:44 PM Mcneil, Ja-Kwan wrote: Reason for CRM: Pt requests that a Rx for cough medication be sent to [id number] - [place], [place] - [address] Pt stated she can not wait another week for an appt. Pt requests call back

## 2021-04-19 ENCOUNTER — Telehealth: Payer: Self-pay

## 2021-04-19 NOTE — Telephone Encounter (Signed)
Please review. Thanks!  

## 2021-04-19 NOTE — Telephone Encounter (Signed)
Tried calling patient back. No answer and no vm. Will try again later. Okay for [facility] triage to advise patient.

## 2021-04-19 NOTE — Telephone Encounter (Signed)
I recommend urgent care as I do not think we have any openings this week.

## 2021-04-19 NOTE — Telephone Encounter (Signed)
Stacey Pierce was advised.

## 2021-04-19 NOTE — Telephone Encounter (Signed)
Copied from [place] [phone]. Topic: General - Other >> Apr 19, 2021 11:04 AM Tessa Lerner A wrote: Reason for CRM: Patient's daughter-in-law has requested that patient be prescribed a cough medicine to help ease discomfort  Patient's coughing has become increasingly frequent   Patient does not believe that they will be able to wait until their appointment  Please contact to further advise >> Apr 19, 2021  1:48 PM Keene Breath wrote: Patient is calling again regarding her cough.  She stated that she still has not heard from the nurse or doctor.  Please advise.

## 2021-04-19 NOTE — Telephone Encounter (Signed)
Copied from [place] [phone]. Topic: General - Other >> Apr 19, 2021 11:04 AM Tessa Lerner A wrote: Reason for CRM: Patient's daughter-in-law has requested that patient be prescribed a cough medicine to help ease discomfort  Patient's coughing has become increasingly frequent   Patient does not believe that they will be able to wait until their appointment  Please contact to further advise

## 2021-04-20 ENCOUNTER — Ambulatory Visit (INDEPENDENT_AMBULATORY_CARE_PROVIDER_SITE_OTHER): Payer: HMO | Admitting: Family Medicine

## 2021-04-20 DIAGNOSIS — J189 Pneumonia, unspecified organism: Secondary | ICD-10-CM | POA: Diagnosis not present

## 2021-04-20 DIAGNOSIS — R059 Cough, unspecified: Secondary | ICD-10-CM

## 2021-04-20 MED ORDER — DOXYCYCLINE HYCLATE 100 MG PO TABS: 100.0000 mg | ORAL_TABLET | Freq: Two times a day (BID) | ORAL | 0 refills | Status: DC

## 2021-04-20 NOTE — Progress Notes (Signed)
Virtual telephone visit    Virtual Visit via Telephone Note   This visit type was conducted due to national recommendations for restrictions regarding the COVID-19 Pandemic (e.g. social distancing) in an effort to limit this patient's exposure and mitigate transmission in our community. Due to her co-morbid illnesses, this patient is at least at moderate risk for complications without adequate follow up. This format is felt to be most appropriate for this patient at this time. The patient did not have access to video technology or had technical difficulties with video requiring transitioning to audio format only (telephone). Physical exam was limited to content and character of the telephone converstion.    Patient location: home Provider location: Office  I discussed the limitations of evaluation and management by telemedicine and the availability of in person appointments. The patient expressed understanding and agreed to proceed.   Visit Date: 04/20/2021  Today's healthcare provider: Wilhemena Durie, MD   No chief complaint on file.  Subjective    HPI  This is a phone call for this patient who has had 2 weeks of cough which is not really getting any better.  She is recently has noticed mild wheezing and is getting mildly short of breath.  No fever or myalgias.  Patient is not COVID vaccinated.  She did have a negative COVID test early on in this at home but has not had a PCR.  She has been taking Robitussin and taking some whiskey at night which is helped the cough.  She requests an antibiotic.       Medications: Outpatient Medications Prior to Visit  Medication Sig  . acetaminophen (TYLENOL) 500 MG tablet Take 1,000 mg by mouth every 6 (six) hours as needed for mild pain.  Marland Kitchen amitriptyline (ELAVIL) 10 MG tablet Take 10 mg by mouth at bedtime.  Marland Kitchen aspirin EC 81 MG tablet Take 81 mg by mouth daily.  Marland Kitchen atorvastatin (LIPITOR) 40 MG tablet TAKE 1 TABLET (40 MG TOTAL) BY MOUTH  DAILY.  . BD INSULIN SYRINGE U/F 31G X 5/16" 1 ML MISC 4 (four) times daily.  . carvedilol (COREG) 25 MG tablet TAKE 1 TABLET BY MOUTH 2 TIMES DAILY WITH A MEAL.  . clindamycin (CLEOCIN) 300 MG capsule Take 2 capsules (600 mg) one hour prior to all dental visits.  . DiphenhydrAMINE HCl ([facility] ALLERGY PO) Take 1 tablet by mouth daily.  . dorzolamide-timolol (COSOPT) 22.3-6.8 MG/ML ophthalmic solution Place 1 drop into both eyes 2 (two) times daily.  Marland Kitchen doxycycline (VIBRA-TABS) 100 MG tablet Take 1 tablet (100 mg total) by mouth 2 (two) times daily.  Marland Kitchen gabapentin (NEURONTIN) 300 MG capsule Take 2 capsules by mouth.   Marland Kitchen glucose blood test strip Use three test strips daily as instructed.  . hydrALAZINE (APRESOLINE) 25 MG tablet Take 1 tablet (25 mg total) by mouth 2 times daily at 12 noon and 4 pm. (Patient not taking: Reported on 11/04/2020)  . insulin aspart (NOVOLOG) 100 UNIT/ML injection Inject 20-25 Units into the skin 2 (two) times daily before a meal. Takes 20 units before breakfast and 25 units before dinner.  . insulin detemir (LEVEMIR) 100 UNIT/ML injection Inject 20 units into the skin in the morning and 55 units at bedtime  . latanoprost (XALATAN) 0.005 % ophthalmic solution Place 1 drop into both eyes at bedtime.   Marland Kitchen levothyroxine (SYNTHROID) 112 MCG tablet TAKE 1 TABLET (112 MCG TOTAL) BY MOUTH DAILY BEFORE BREAKFAST.  Marland Kitchen losartan (COZAAR) 100 MG tablet  TAKE 1 TABLET BY MOUTH EVERY DAY  . metFORMIN (GLUCOPHAGE) 1000 MG tablet Take 2,000 mg by mouth daily after supper.  . pantoprazole (PROTONIX) 40 MG tablet TAKE 1 TABLET (40 MG TOTAL) BY MOUTH DAILY AT 12 NOON.  Marland Kitchen predniSONE (DELTASONE) 20 MG tablet Take 1 tablet (20 mg total) by mouth daily with breakfast.  . torsemide (DEMADEX) 10 MG tablet Take 10 mg by mouth daily.   No facility-administered medications prior to visit.    Review of Systems     Objective    There were no vitals taken for this visit. Patient is cooperative and  pleasant only call.  She is having no difficulty breathing with her conversation and no audible wheezes    Assessment & Plan     1. Community acquired pneumonia, unspecified laterality Clinically I think the patient probably has walking pneumonia.  Treat with doxycycline.  Chest x-ray if she worsens. She must get a COVID PCR.  I think it will not be too late to treat it no matter what unless she clinically deteriorates  2. Cough Patient has some leftover cough syrup with narcotic from before.  She will use honey and whiskey at night.  She states this works better than the cough syrup.  Continue Robitussin twice a day routinely until cough is resolved   No follow-ups on file.    I discussed the assessment and treatment plan with the patient. The patient was provided an opportunity to ask questions and all were answered. The patient agreed with the plan and demonstrated an understanding of the instructions.   The patient was advised to call back or seek an in-person evaluation if the symptoms worsen or if the condition fails to improve as anticipated.  I provided 12 minutes of non-face-to-face time during this encounter.  I, Wilhemena Durie, MD, have reviewed all documentation for this visit. The documentation on 04/20/21 for the exam, diagnosis, procedures, and orders are all accurate and complete.   Virgina Deakins Cranford Mon, MD [facility] [phone] (phone) [phone] (fax)  [place]

## 2021-05-05 ENCOUNTER — Ambulatory Visit: Payer: Self-pay | Admitting: Family Medicine

## 2021-05-12 ENCOUNTER — Encounter: Payer: Self-pay | Admitting: Family Medicine

## 2021-05-12 ENCOUNTER — Ambulatory Visit (INDEPENDENT_AMBULATORY_CARE_PROVIDER_SITE_OTHER): Payer: HMO | Admitting: Family Medicine

## 2021-05-12 ENCOUNTER — Other Ambulatory Visit: Payer: Self-pay

## 2021-05-12 VITALS — BP 124/62 | HR 60 | Temp 98.4°F | Resp 16 | Ht 65.0 in | Wt 191.0 lb

## 2021-05-12 DIAGNOSIS — E785 Hyperlipidemia, unspecified: Secondary | ICD-10-CM

## 2021-05-12 DIAGNOSIS — I350 Nonrheumatic aortic (valve) stenosis: Secondary | ICD-10-CM

## 2021-05-12 DIAGNOSIS — M545 Low back pain, unspecified: Secondary | ICD-10-CM

## 2021-05-12 DIAGNOSIS — J069 Acute upper respiratory infection, unspecified: Secondary | ICD-10-CM

## 2021-05-12 DIAGNOSIS — I10 Essential (primary) hypertension: Secondary | ICD-10-CM

## 2021-05-12 DIAGNOSIS — G8929 Other chronic pain: Secondary | ICD-10-CM | POA: Diagnosis not present

## 2021-05-12 DIAGNOSIS — R059 Cough, unspecified: Secondary | ICD-10-CM | POA: Diagnosis not present

## 2021-05-12 DIAGNOSIS — Z952 Presence of prosthetic heart valve: Secondary | ICD-10-CM | POA: Diagnosis not present

## 2021-05-12 DIAGNOSIS — N183 Chronic kidney disease, stage 3 unspecified: Secondary | ICD-10-CM | POA: Diagnosis not present

## 2021-05-12 NOTE — Progress Notes (Signed)
Established patient visit   Patient: Stacey Pierce   DOB: 1941-03-11   80 y.o. Female  MRN: [id number] Visit Date: 05/12/2021  Today's healthcare provider: Wilhemena Durie, MD   Chief Complaint  Patient presents with   Hypertension   Subjective    HPI  Patient comes in today for follow-up.  She is completely unvaccinated against COVID and she is advised to get vaccinated despite what her son wants her to do.  She states her son told her not to get vaccinated. Overall patient feels fairly well.  She is feeling weak from recent URI/cough.  She is getting better. She has an appointment at [facility] to establish with a new doctor in the fall.  Hypertension, follow-up  BP Readings from Last 3 Encounters:  05/12/21 124/62  11/04/20 121/77  09/06/20 (!) 155/55   Wt Readings from Last 3 Encounters:  05/12/21 191 lb (86.6 kg)  11/04/20 193 lb (87.5 kg)  09/06/20 188 lb (85.3 kg)     She was last seen for hypertension 6 months ago.  BP at that visit was 121/77. Management since that visit includes; Good control. She reports good compliance with treatment. She is not having side effects.  She is not exercising. She is adherent to low salt diet.   Outside blood pressures are checked occasionally.  She does not smoke.  Use of agents associated with hypertension: none.   Patient reports that she still does not feel well. She was treated for cough about 2 weeks ago. She says that she still feels weak.       Medications: Outpatient Medications Prior to Visit  Medication Sig   acetaminophen (TYLENOL) 500 MG tablet Take 1,000 mg by mouth every 6 (six) hours as needed for mild pain.   amitriptyline (ELAVIL) 10 MG tablet Take 10 mg by mouth at bedtime.   aspirin EC 81 MG tablet Take 81 mg by mouth daily.   atorvastatin (LIPITOR) 40 MG tablet TAKE 1 TABLET (40 MG TOTAL) BY MOUTH DAILY.   BD INSULIN SYRINGE U/F 31G X 5/16" 1 ML MISC 4 (four) times daily.    carvedilol (COREG) 25 MG tablet TAKE 1 TABLET BY MOUTH 2 TIMES DAILY WITH A MEAL.   clindamycin (CLEOCIN) 300 MG capsule Take 2 capsules (600 mg) one hour prior to all dental visits.   DiphenhydrAMINE HCl ([facility] ALLERGY PO) Take 1 tablet by mouth daily.   dorzolamide-timolol (COSOPT) 22.3-6.8 MG/ML ophthalmic solution Place 1 drop into both eyes 2 (two) times daily.   doxycycline (VIBRA-TABS) 100 MG tablet Take 1 tablet (100 mg total) by mouth 2 (two) times daily.   doxycycline (VIBRA-TABS) 100 MG tablet Take 1 tablet (100 mg total) by mouth 2 (two) times daily.   gabapentin (NEURONTIN) 300 MG capsule Take 2 capsules by mouth.    glucose blood test strip Use three test strips daily as instructed.   hydrALAZINE (APRESOLINE) 25 MG tablet Take 1 tablet (25 mg total) by mouth 2 times daily at 12 noon and 4 pm. (Patient not taking: Reported on 11/04/2020)   insulin aspart (NOVOLOG) 100 UNIT/ML injection Inject 20-25 Units into the skin 2 (two) times daily before a meal. Takes 20 units before breakfast and 25 units before dinner.   insulin detemir (LEVEMIR) 100 UNIT/ML injection Inject 20 units into the skin in the morning and 55 units at bedtime   latanoprost (XALATAN) 0.005 % ophthalmic solution Place 1 drop into both eyes at bedtime.  levothyroxine (SYNTHROID) 112 MCG tablet TAKE 1 TABLET (112 MCG TOTAL) BY MOUTH DAILY BEFORE BREAKFAST.   losartan (COZAAR) 100 MG tablet TAKE 1 TABLET BY MOUTH EVERY DAY   metFORMIN (GLUCOPHAGE) 1000 MG tablet Take 2,000 mg by mouth daily after supper.   pantoprazole (PROTONIX) 40 MG tablet TAKE 1 TABLET (40 MG TOTAL) BY MOUTH DAILY AT 12 NOON.   predniSONE (DELTASONE) 20 MG tablet Take 1 tablet (20 mg total) by mouth daily with breakfast.   torsemide (DEMADEX) 10 MG tablet Take 10 mg by mouth daily.   No facility-administered medications prior to visit.    Review of Systems  Constitutional:  Negative for appetite change, chills, fatigue and fever.  Respiratory:   Negative for chest tightness and shortness of breath.   Cardiovascular:  Negative for chest pain and palpitations.  Gastrointestinal:  Negative for abdominal pain, nausea and vomiting.  Neurological:  Negative for dizziness and weakness.       Objective    BP 124/62   Pulse 60   Temp 98.4 F (36.9 C)   Resp 16   Ht 5\' 5"  (1.651 m)   Wt 191 lb (86.6 kg)   BMI 31.78 kg/m  BP Readings from Last 3 Encounters:  05/12/21 124/62  11/04/20 121/77  09/06/20 (!) 155/55   Wt Readings from Last 3 Encounters:  05/12/21 191 lb (86.6 kg)  11/04/20 193 lb (87.5 kg)  09/06/20 188 lb (85.3 kg)       Physical Exam Vitals reviewed.  Constitutional:      General: She is not in acute distress.    Appearance: She is well-developed. She is obese. She is not diaphoretic.  HENT:     Head: Normocephalic and atraumatic.  Eyes:     General: No scleral icterus.    Conjunctiva/sclera: Conjunctivae normal.  Neck:     Thyroid: No thyromegaly.  Cardiovascular:     Rate and Rhythm: Normal rate and regular rhythm.     Pulses: Normal pulses.     Heart sounds: Murmur heard.  Pulmonary:     Effort: Pulmonary effort is normal. No respiratory distress.     Breath sounds: Normal breath sounds. No wheezing, rhonchi or rales.  Musculoskeletal:     Cervical back: Neck supple.     Right lower leg: No edema.     Left lower leg: No edema.  Lymphadenopathy:     Cervical: No cervical adenopathy.  Skin:    General: Skin is warm and dry.     Findings: No rash.  Neurological:     General: No focal deficit present.     Mental Status: She is alert and oriented to person, place, and time.  Psychiatric:        Mood and Affect: Mood normal.        Behavior: Behavior normal.        Thought Content: Thought content normal.        Judgment: Judgment normal.      No results found for any visits on 05/12/21.  Assessment & Plan     1. Cough This is improving.  It is certainly possible the patient has had  COVID but she does not wish further intervention.  Ultimately also declines vaccination  2. Upper respiratory tract infection, unspecified type Patient declines chest x-ray or COVID work-up.  She is feeling better  3. Benign essential HTN Trolled on Coreg and losartan  4. Aortic stenosis, severe Patient is status post TAVR  5. S/P  TAVR (transcatheter aortic valve replacement)   6. Stage 3 chronic kidney disease, unspecified whether stage 3a or 3b CKD (HCC) Avoid NSAID  7. Chronic right-sided low back pain without sciatica Followed by physiatry  8. Hyperlipidemia, unspecified hyperlipidemia type On atorvastatin 40   No follow-ups on file.      I, Wilhemena Durie, MD, have reviewed all documentation for this visit. The documentation on 05/15/21 for the exam, diagnosis, procedures, and orders are all accurate and complete.    Ciarra Braddy Cranford Mon, MD  [facility] [phone] (phone) [phone] (fax)  [place]

## 2021-05-20 DIAGNOSIS — N2581 Secondary hyperparathyroidism of renal origin: Secondary | ICD-10-CM | POA: Diagnosis not present

## 2021-05-20 DIAGNOSIS — R809 Proteinuria, unspecified: Secondary | ICD-10-CM | POA: Diagnosis not present

## 2021-05-20 DIAGNOSIS — I129 Hypertensive chronic kidney disease with stage 1 through stage 4 chronic kidney disease, or unspecified chronic kidney disease: Secondary | ICD-10-CM | POA: Diagnosis not present

## 2021-05-20 DIAGNOSIS — D631 Anemia in chronic kidney disease: Secondary | ICD-10-CM | POA: Diagnosis not present

## 2021-05-20 DIAGNOSIS — N1832 Chronic kidney disease, stage 3b: Secondary | ICD-10-CM | POA: Diagnosis not present

## 2021-05-20 DIAGNOSIS — E1122 Type 2 diabetes mellitus with diabetic chronic kidney disease: Secondary | ICD-10-CM | POA: Diagnosis not present

## 2021-05-31 DIAGNOSIS — D631 Anemia in chronic kidney disease: Secondary | ICD-10-CM | POA: Diagnosis not present

## 2021-05-31 DIAGNOSIS — N2581 Secondary hyperparathyroidism of renal origin: Secondary | ICD-10-CM | POA: Diagnosis not present

## 2021-05-31 DIAGNOSIS — N1832 Chronic kidney disease, stage 3b: Secondary | ICD-10-CM | POA: Diagnosis not present

## 2021-05-31 DIAGNOSIS — E1122 Type 2 diabetes mellitus with diabetic chronic kidney disease: Secondary | ICD-10-CM | POA: Diagnosis not present

## 2021-05-31 DIAGNOSIS — I129 Hypertensive chronic kidney disease with stage 1 through stage 4 chronic kidney disease, or unspecified chronic kidney disease: Secondary | ICD-10-CM | POA: Diagnosis not present

## 2021-05-31 DIAGNOSIS — R809 Proteinuria, unspecified: Secondary | ICD-10-CM | POA: Diagnosis not present

## 2021-06-08 ENCOUNTER — Other Ambulatory Visit: Payer: Self-pay | Admitting: Family Medicine

## 2021-06-09 DIAGNOSIS — I350 Nonrheumatic aortic (valve) stenosis: Secondary | ICD-10-CM | POA: Diagnosis not present

## 2021-06-09 DIAGNOSIS — E782 Mixed hyperlipidemia: Secondary | ICD-10-CM | POA: Diagnosis not present

## 2021-06-09 DIAGNOSIS — I10 Essential (primary) hypertension: Secondary | ICD-10-CM | POA: Diagnosis not present

## 2021-06-09 DIAGNOSIS — I2510 Atherosclerotic heart disease of native coronary artery without angina pectoris: Secondary | ICD-10-CM | POA: Diagnosis not present

## 2021-06-10 DIAGNOSIS — H401131 Primary open-angle glaucoma, bilateral, mild stage: Secondary | ICD-10-CM | POA: Diagnosis not present

## 2021-06-17 DIAGNOSIS — H401131 Primary open-angle glaucoma, bilateral, mild stage: Secondary | ICD-10-CM | POA: Diagnosis not present

## 2021-06-17 LAB — HM DIABETES EYE EXAM

## 2021-07-02 ENCOUNTER — Emergency Department: Payer: HMO

## 2021-07-02 ENCOUNTER — Encounter: Payer: Self-pay | Admitting: Oncology

## 2021-07-02 ENCOUNTER — Other Ambulatory Visit: Payer: Self-pay | Admitting: Family Medicine

## 2021-07-02 ENCOUNTER — Emergency Department
Admission: EM | Admit: 2021-07-02 | Discharge: 2021-07-02 | Disposition: A | Discharge status: Home / Self Care | Payer: HMO | Attending: Emergency Medicine | Admitting: Emergency Medicine

## 2021-07-02 ENCOUNTER — Other Ambulatory Visit: Payer: Self-pay

## 2021-07-02 DIAGNOSIS — W19XXXA Unspecified fall, initial encounter: Secondary | ICD-10-CM

## 2021-07-02 DIAGNOSIS — E039 Hypothyroidism, unspecified: Secondary | ICD-10-CM | POA: Diagnosis not present

## 2021-07-02 DIAGNOSIS — Z7982 Long term (current) use of aspirin: Secondary | ICD-10-CM | POA: Insufficient documentation

## 2021-07-02 DIAGNOSIS — E11319 Type 2 diabetes mellitus with unspecified diabetic retinopathy without macular edema: Secondary | ICD-10-CM | POA: Diagnosis not present

## 2021-07-02 DIAGNOSIS — Z7984 Long term (current) use of oral hypoglycemic drugs: Secondary | ICD-10-CM | POA: Diagnosis not present

## 2021-07-02 DIAGNOSIS — M25561 Pain in right knee: Secondary | ICD-10-CM | POA: Diagnosis not present

## 2021-07-02 DIAGNOSIS — S8001XA Contusion of right knee, initial encounter: Secondary | ICD-10-CM

## 2021-07-02 DIAGNOSIS — R609 Edema, unspecified: Secondary | ICD-10-CM | POA: Diagnosis not present

## 2021-07-02 DIAGNOSIS — S022XXA Fracture of nasal bones, initial encounter for closed fracture: Secondary | ICD-10-CM | POA: Diagnosis not present

## 2021-07-02 DIAGNOSIS — Z79899 Other long term (current) drug therapy: Secondary | ICD-10-CM | POA: Insufficient documentation

## 2021-07-02 DIAGNOSIS — I10 Essential (primary) hypertension: Secondary | ICD-10-CM | POA: Diagnosis not present

## 2021-07-02 DIAGNOSIS — Z794 Long term (current) use of insulin: Secondary | ICD-10-CM | POA: Diagnosis not present

## 2021-07-02 DIAGNOSIS — N183 Chronic kidney disease, stage 3 unspecified: Secondary | ICD-10-CM | POA: Insufficient documentation

## 2021-07-02 DIAGNOSIS — E1122 Type 2 diabetes mellitus with diabetic chronic kidney disease: Secondary | ICD-10-CM | POA: Diagnosis not present

## 2021-07-02 DIAGNOSIS — Y9302 Activity, running: Secondary | ICD-10-CM | POA: Diagnosis not present

## 2021-07-02 DIAGNOSIS — R58 Hemorrhage, not elsewhere classified: Secondary | ICD-10-CM | POA: Diagnosis not present

## 2021-07-02 DIAGNOSIS — I129 Hypertensive chronic kidney disease with stage 1 through stage 4 chronic kidney disease, or unspecified chronic kidney disease: Secondary | ICD-10-CM | POA: Diagnosis not present

## 2021-07-02 DIAGNOSIS — D631 Anemia in chronic kidney disease: Secondary | ICD-10-CM | POA: Diagnosis not present

## 2021-07-02 DIAGNOSIS — W010XXA Fall on same level from slipping, tripping and stumbling without subsequent striking against object, initial encounter: Secondary | ICD-10-CM | POA: Diagnosis not present

## 2021-07-02 DIAGNOSIS — S0993XA Unspecified injury of face, initial encounter: Secondary | ICD-10-CM | POA: Diagnosis present

## 2021-07-02 MED ORDER — CEPHALEXIN 500 MG PO CAPS: 500.0000 mg | ORAL_CAPSULE | Freq: Two times a day (BID) | ORAL | 0 refills | Status: DC

## 2021-07-02 NOTE — ED Notes (Signed)
Pt assisted to [facility] and back. Moved independently and in a stable manner. Pt assisted back into bed and hooked back up to monitors.

## 2021-07-02 NOTE — Telephone Encounter (Signed)
Requested medication (s) are due for refill today: yes  Requested medication (s) are on the active medication list: yes  Last refill:  04/01/21 #90  Future visit scheduled: no  Notes to clinic:  pt due a visit, overdue lab work   Requested Prescriptions  Pending Prescriptions Disp Refills   losartan (COZAAR) 100 MG tablet [Pharmacy Med Name: LOSARTAN POTASSIUM 100 MG TAB] 90 tablet 0    Sig: TAKE 1 TABLET BY MOUTH EVERY DAY      Cardiovascular:  Angiotensin Receptor Blockers Failed - 07/02/2021  9:02 AM      Failed - Cr in normal range and within 180 days    Creat  Date Value Ref Range Status  06/08/2015 1.13 (H) 0.50 - 1.10 mg/dL Final   Creatinine, Ser  Date Value Ref Range Status  08/27/2020 1.36 (H) 0.57 - 1.00 mg/dL Final          Failed - K in normal range and within 180 days    Potassium  Date Value Ref Range Status  08/27/2020 4.6 3.5 - 5.2 mmol/L Final          Passed - Patient is not pregnant      Passed - Last BP in normal range    BP Readings from Last 1 Encounters:  05/12/21 124/62          Passed - Valid encounter within last 6 months    Recent Outpatient Visits           1 month ago Cough   [facility] Jerrol Banana., MD   2 months ago Community acquired pneumonia, unspecified laterality   [facility] Jerrol Banana., MD   4 months ago Cough   [facility] Jerrol Banana., MD   8 months ago Arteriosclerosis of coronary artery   [facility] Jerrol Banana., MD   9 months ago Left hip pain   [facility] [place], Fabio Bering [place], [place]

## 2021-07-02 NOTE — ED Triage Notes (Signed)
Pt was getting her 63 m/o grandaughter out of the car, stood her on the driveway and baby went running down the driveway. Pt started running after her and tripped in the driveway and fell. Reports hitting face and R knee. Small abrasion to R knee noted. Nose swollen at nares, dried blood noted to base of nares, shirt covered in drying blood.

## 2021-07-02 NOTE — ED Provider Notes (Signed)
[facility] Emergency Department Provider Note   ____________________________________________    I have reviewed the triage vital signs and the nursing notes.   HISTORY  Chief Complaint Fall (Pt tripped and fell on driveway)     HPI Stacey Pierce is a 80 y.o. female who presents after a fall.  Patient has a history of diabetes, CKD, hypertension.  Patient reports her great granddaughter was running down the driveway and she was concerned she was never run into the street so she ran after her and felt onto her right knee and struck her face on the driveway.  She is not on blood thinners.  No back pain.  Complains primarily of right knee pain and nasal pain  Past Medical History:  Diagnosis Date   Anemia    Aortic stenosis, severe    a. 09/2017: s/p TAVR   CKD (chronic kidney disease)    Diabetes mellitus, insulin dependent (IDDM), uncontrolled    DR (diabetic retinopathy) ([place])    GERD (gastroesophageal reflux disease)    Glaucoma    Hyperlipidemia    Hypertension    Hypothyroidism    Obesity    S/P TAVR (transcatheter aortic valve replacement) 09/04/2017   26 mm Medtronic Evolut Pro transcatheter heart valve placed via percutaneous right transfemoral approach     Patient Active Problem List   Diagnosis Date Noted   Venous insufficiency of both lower extremities 06/13/2018   Iron deficiency anemia due to chronic blood loss 04/10/2018   B12 deficiency anemia 03/13/2018   Dizziness 01/10/2018   Obesity    S/P TAVR (transcatheter aortic valve replacement) 09/04/2017   Aortic stenosis, severe 07/30/2017   Allergic rhinitis 08/26/2015   Airway hyperreactivity 08/26/2015   Back pain, chronic 08/26/2015   Diabetes ([place]) 08/26/2015   Degeneration of lumbar or lumbosacral intervertebral disc 08/26/2015   Acid reflux 08/26/2015   Glaucoma 08/26/2015   HLD (hyperlipidemia) 08/26/2015   Adiposity 08/26/2015   Benign essential HTN 04/29/2015    Arteriosclerosis of coronary artery 01/26/2015   Retinopathy 11/19/2014   Chronic kidney disease (CKD), stage III (moderate) ([place]) 11/19/2014   Long-term insulin use ([place]) 11/19/2014   Adult hypothyroidism 02/17/2014    Past Surgical History:  Procedure Laterality Date   CATARACT EXTRACTION     left 2010   CATARACT EXTRACTION W/PHACO Right 10/13/2015   Procedure: CATARACT EXTRACTION PHACO AND INTRAOCULAR LENS PLACEMENT ([place]);  Surgeon: Leandrew Koyanagi, MD;  Location: [place];  Service: Ophthalmology;  Laterality: Right;  DIABETIC - insulin and oral meds [place]   COLONOSCOPY WITH PROPOFOL N/A 05/16/2018   Procedure: COLONOSCOPY WITH PROPOFOL;  Surgeon: Lin Landsman, MD;  Location: [facility] ENDOSCOPY;  Service: Gastroenterology;  Laterality: N/A;   DILATION AND CURETTAGE OF UTERUS     ESOPHAGOGASTRODUODENOSCOPY (EGD) WITH PROPOFOL N/A 05/16/2018   Procedure: ESOPHAGOGASTRODUODENOSCOPY (EGD) WITH PROPOFOL;  Surgeon: Lin Landsman, MD;  Location: [facility] ENDOSCOPY;  Service: Gastroenterology;  Laterality: N/A;   EYE SURGERY     LAPAROSCOPIC TUBAL LIGATION     RIGHT/LEFT HEART CATH AND CORONARY ANGIOGRAPHY N/A 08/02/2017   Procedure: RIGHT/LEFT HEART CATH AND CORONARY ANGIOGRAPHY;  Surgeon: Corey Skains, MD;  Location: [place] CV LAB;  Service: Cardiovascular;  Laterality: N/A;   TEE WITHOUT CARDIOVERSION N/A 09/04/2017   Procedure: TRANSESOPHAGEAL ECHOCARDIOGRAM (TEE);  Surgeon: Sherren Mocha, MD;  Location: [place];  Service: Open Heart Surgery;  Laterality: N/A;  TONSILLECTOMY     1974   TRANSCATHETER AORTIC VALVE REPLACEMENT, TRANSFEMORAL N/A 09/04/2017   Procedure: TRANSCATHETER AORTIC VALVE REPLACEMENT, TRANSFEMORAL;  Surgeon: Sherren Mocha, MD;  Location: [place];  Service: Open Heart Surgery;  Laterality: N/A;   TUBAL LIGATION      Prior to Admission medications   Medication Sig Start Date End Date Taking?  Authorizing Provider  cephALEXin (KEFLEX) 500 MG capsule Take 1 capsule (500 mg total) by mouth 2 (two) times daily. 07/02/21  Yes Lavonia Drafts, MD  acetaminophen (TYLENOL) 500 MG tablet Take 1,000 mg by mouth every 6 (six) hours as needed for mild pain.    [provider]  amitriptyline (ELAVIL) 10 MG tablet Take 10 mg by mouth at bedtime.    [provider]  aspirin EC 81 MG tablet Take 81 mg by mouth daily.    [provider]  atorvastatin (LIPITOR) 40 MG tablet TAKE 1 TABLET (40 MG TOTAL) BY MOUTH DAILY. 06/08/21   Jerrol Banana., MD  BD INSULIN SYRINGE U/F 31G X 5/16" 1 ML MISC 4 (four) times daily. 12/25/17   [provider]  carvedilol (COREG) 25 MG tablet TAKE 1 TABLET BY MOUTH 2 TIMES DAILY WITH A MEAL. 05/08/20   Jerrol Banana., MD  clindamycin (CLEOCIN) 300 MG capsule Take 2 capsules (600 mg) one hour prior to all dental visits. 12/24/19   Eileen Stanford, PA-C  DiphenhydrAMINE HCl ([facility] ALLERGY PO) Take 1 tablet by mouth daily.    [provider]  dorzolamide-timolol (COSOPT) 22.3-6.8 MG/ML ophthalmic solution Place 1 drop into both eyes 2 (two) times daily. 07/10/16   [provider]  doxycycline (VIBRA-TABS) 100 MG tablet Take 1 tablet (100 mg total) by mouth 2 (two) times daily. 02/08/21   Jerrol Banana., MD  doxycycline (VIBRA-TABS) 100 MG tablet Take 1 tablet (100 mg total) by mouth 2 (two) times daily. 04/20/21   Jerrol Banana., MD  gabapentin (NEURONTIN) 300 MG capsule Take 2 capsules by mouth.  12/06/18 08/10/20  [provider]  glucose blood test strip Use three test strips daily as instructed. 04/20/17   [provider]  hydrALAZINE (APRESOLINE) 25 MG tablet Take 1 tablet (25 mg total) by mouth 2 times daily at 12 noon and 4 pm. Patient not taking: Reported on 11/04/2020 08/26/20   Jerrol Banana., MD  insulin aspart (NOVOLOG) 100 UNIT/ML injection Inject 20-25 Units into the skin  2 (two) times daily before a meal. Takes 20 units before breakfast and 25 units before dinner.    [provider]  insulin detemir (LEVEMIR) 100 UNIT/ML injection Inject 20 units into the skin in the morning and 55 units at bedtime    [provider]  latanoprost (XALATAN) 0.005 % ophthalmic solution Place 1 drop into both eyes at bedtime.     [provider]  levothyroxine (SYNTHROID) 112 MCG tablet TAKE 1 TABLET (112 MCG TOTAL) BY MOUTH DAILY BEFORE BREAKFAST. 02/07/21   Jerrol Banana., MD  losartan (COZAAR) 100 MG tablet TAKE 1 TABLET BY MOUTH EVERY DAY 04/01/21   Jerrol Banana., MD  metFORMIN (GLUCOPHAGE) 1000 MG tablet Take 2,000 mg by mouth daily after supper. 12/31/15   [provider]  pantoprazole (PROTONIX) 40 MG tablet TAKE 1 TABLET (40 MG TOTAL) BY MOUTH DAILY AT 12 NOON. 11/18/18   Eileen Stanford, PA-C  predniSONE (DELTASONE) 20 MG tablet Take 1 tablet (20 mg  total) by mouth daily with breakfast. 02/08/21   Jerrol Banana., MD  torsemide (DEMADEX) 10 MG tablet Take 10 mg by mouth daily. 09/02/20   [provider]     Allergies Hydrocodone bit-homatrop mbr, Azithromycin, Codeine, Erythromycin, Penicillins, and Sulfa antibiotics  Family History  Problem Relation Age of Onset   Alzheimer's disease Mother    Colon polyps Mother    Hypertension Mother    Heart attack Father    Coronary artery disease Father    Diabetes Father    Breast cancer Sister    Colon polyps Sister    Diabetes Sister    Diabetes Sister    Diabetes Sister    Diabetes Sister     Social History Social History   Tobacco Use   Smoking status: Never   Smokeless tobacco: Never  Vaping Use   Vaping Use: Never used  Substance Use Topics   Alcohol use: No    Alcohol/week: 0.0 standard drinks   Drug use: Never    Review of Systems  Constitutional: No fever/chills Eyes: No visual changes.  ENT: Nosebleed Cardiovascular: No chest  pain Respiratory: Denies shortness of breath. Gastrointestinal: No abdominal pain.  No nausea, no vomiting.   Genitourinary: Negative for dysuria. Musculoskeletal: As above, no back pain, no neck pain Skin: Abrasion Neurological: Negative for headaches or weakness   ____________________________________________   PHYSICAL EXAM:  VITAL SIGNS: ED Triage Vitals  Enc Vitals Group     BP 07/02/21 1041 (!) 190/59     Pulse Rate 07/02/21 1041 78     Resp 07/02/21 1041 19     Temp 07/02/21 1041 98.3 F (36.8 C)     Temp Source 07/02/21 1041 Oral     SpO2 07/02/21 1041 97 %     Weight 07/02/21 1040 89.5 kg (197 lb 6.4 oz)     Height 07/02/21 1040 1.651 m (5\' 5" )     Head Circumference --      Peak Flow --      Pain Score 07/02/21 1039 6     Pain Loc --      Pain Edu? --      Excl. in [place]? --     Constitutional: Alert and oriented.  Eyes: Conjunctivae are normal.   Nose: Dried blood in both naris, swelling suggestive of fracture Mouth/Throat: Mucous membranes are moist.   Neck: No pain with axial load, no vertebral terms palpation Cardiovascular: Normal rate, regular rhythm. Grossly normal heart sounds.  Good peripheral circulation.  No chest wall tenderness palpation Respiratory: Normal respiratory effort.  No retractions. Lungs CTAB. Gastrointestinal: Soft and nontender. No distention.    Musculoskeletal: Full range of motion of upper extremities and lower extremities, no pain with axial load on both hips.  Patient is able to flex at the right knee although it is mildly swollen.  Warm and well perfused Neurologic:  Normal speech and language. No gross focal neurologic deficits are appreciated.  Skin:  Skin is warm, dry.  Multiple shallow abrasions Psychiatric: Mood and affect are normal. Speech and behavior are normal.  ____________________________________________   LABS (all labs ordered are listed, but only abnormal results are displayed)  Labs Reviewed - No data to  display ____________________________________________  EKG  ED ECG REPORT I, Lavonia Drafts, the attending physician, personally viewed and interpreted this ECG.  Date: 07/02/2021  Rhythm: normal sinus rhythm QRS Axis: normal Intervals: normal ST/T Wave abnormalities: normal Narrative Interpretation: no evidence of acute ischemia  ____________________________________________  RADIOLOGY  Knee x-ray reviewed by me, no fracture CT head and max face with left-sided nasal fracture ____________________________________________   PROCEDURES  Procedure(s) performed: No  Procedures   Critical Care performed: No ____________________________________________   INITIAL IMPRESSION / ASSESSMENT AND PLAN / ED COURSE  Pertinent labs & imaging results that were available during my care of the patient were reviewed by me and considered in my medical decision making (see chart for details).   Patient presents after mechanical fall as detailed above.  CT imaging demonstrates nasal fracture, bleeding controlled.  Knee contusion, no fracture.  Recommend supportive care outpatient follow-up with ENT, antibiotic prophylaxis.    ____________________________________________   FINAL CLINICAL IMPRESSION(S) / ED DIAGNOSES  Final diagnoses:  Fall, initial encounter  Closed fracture of nasal bone, initial encounter  Contusion of right knee, initial encounter        Note:  This document was prepared using Dragon voice recognition software and may include unintentional dictation errors.    Lavonia Drafts, MD 07/02/21 [phone]

## 2021-07-07 DIAGNOSIS — S022XXA Fracture of nasal bones, initial encounter for closed fracture: Secondary | ICD-10-CM | POA: Diagnosis not present

## 2021-07-08 DIAGNOSIS — Z794 Long term (current) use of insulin: Secondary | ICD-10-CM | POA: Diagnosis not present

## 2021-07-08 DIAGNOSIS — N1832 Chronic kidney disease, stage 3b: Secondary | ICD-10-CM | POA: Diagnosis not present

## 2021-07-08 DIAGNOSIS — E1122 Type 2 diabetes mellitus with diabetic chronic kidney disease: Secondary | ICD-10-CM | POA: Diagnosis not present

## 2021-07-08 DIAGNOSIS — E1142 Type 2 diabetes mellitus with diabetic polyneuropathy: Secondary | ICD-10-CM | POA: Diagnosis not present

## 2021-07-15 DIAGNOSIS — N184 Chronic kidney disease, stage 4 (severe): Secondary | ICD-10-CM | POA: Diagnosis not present

## 2021-07-15 DIAGNOSIS — E785 Hyperlipidemia, unspecified: Secondary | ICD-10-CM | POA: Diagnosis not present

## 2021-07-15 DIAGNOSIS — E1129 Type 2 diabetes mellitus with other diabetic kidney complication: Secondary | ICD-10-CM | POA: Diagnosis not present

## 2021-07-15 DIAGNOSIS — E11319 Type 2 diabetes mellitus with unspecified diabetic retinopathy without macular edema: Secondary | ICD-10-CM | POA: Diagnosis not present

## 2021-07-15 DIAGNOSIS — E1122 Type 2 diabetes mellitus with diabetic chronic kidney disease: Secondary | ICD-10-CM | POA: Diagnosis not present

## 2021-07-15 DIAGNOSIS — R809 Proteinuria, unspecified: Secondary | ICD-10-CM | POA: Diagnosis not present

## 2021-07-15 DIAGNOSIS — Z794 Long term (current) use of insulin: Secondary | ICD-10-CM | POA: Diagnosis not present

## 2021-07-15 DIAGNOSIS — E1169 Type 2 diabetes mellitus with other specified complication: Secondary | ICD-10-CM | POA: Diagnosis not present

## 2021-07-15 DIAGNOSIS — E1142 Type 2 diabetes mellitus with diabetic polyneuropathy: Secondary | ICD-10-CM | POA: Diagnosis not present

## 2021-07-28 DIAGNOSIS — S022XXA Fracture of nasal bones, initial encounter for closed fracture: Secondary | ICD-10-CM | POA: Diagnosis not present

## 2021-08-03 ENCOUNTER — Other Ambulatory Visit: Payer: Self-pay | Admitting: Family Medicine

## 2021-08-03 DIAGNOSIS — I10 Essential (primary) hypertension: Secondary | ICD-10-CM

## 2021-08-03 DIAGNOSIS — E039 Hypothyroidism, unspecified: Secondary | ICD-10-CM

## 2021-08-16 DIAGNOSIS — N1832 Chronic kidney disease, stage 3b: Secondary | ICD-10-CM | POA: Diagnosis not present

## 2021-08-16 DIAGNOSIS — I129 Hypertensive chronic kidney disease with stage 1 through stage 4 chronic kidney disease, or unspecified chronic kidney disease: Secondary | ICD-10-CM | POA: Diagnosis not present

## 2021-08-16 DIAGNOSIS — E1122 Type 2 diabetes mellitus with diabetic chronic kidney disease: Secondary | ICD-10-CM | POA: Diagnosis not present

## 2021-08-16 DIAGNOSIS — D631 Anemia in chronic kidney disease: Secondary | ICD-10-CM | POA: Diagnosis not present

## 2021-08-16 DIAGNOSIS — R809 Proteinuria, unspecified: Secondary | ICD-10-CM | POA: Diagnosis not present

## 2021-08-16 DIAGNOSIS — N2581 Secondary hyperparathyroidism of renal origin: Secondary | ICD-10-CM | POA: Diagnosis not present

## 2021-08-17 DIAGNOSIS — M47816 Spondylosis without myelopathy or radiculopathy, lumbar region: Secondary | ICD-10-CM | POA: Diagnosis not present

## 2021-08-23 DIAGNOSIS — E11319 Type 2 diabetes mellitus with unspecified diabetic retinopathy without macular edema: Secondary | ICD-10-CM | POA: Diagnosis not present

## 2021-08-23 DIAGNOSIS — N2581 Secondary hyperparathyroidism of renal origin: Secondary | ICD-10-CM | POA: Diagnosis not present

## 2021-08-23 DIAGNOSIS — N1831 Chronic kidney disease, stage 3a: Secondary | ICD-10-CM | POA: Diagnosis not present

## 2021-08-23 DIAGNOSIS — Z794 Long term (current) use of insulin: Secondary | ICD-10-CM | POA: Diagnosis not present

## 2021-08-23 DIAGNOSIS — H409 Unspecified glaucoma: Secondary | ICD-10-CM | POA: Diagnosis not present

## 2021-08-23 DIAGNOSIS — E1122 Type 2 diabetes mellitus with diabetic chronic kidney disease: Secondary | ICD-10-CM | POA: Diagnosis not present

## 2021-08-23 DIAGNOSIS — Z23 Encounter for immunization: Secondary | ICD-10-CM | POA: Diagnosis not present

## 2021-08-23 DIAGNOSIS — E039 Hypothyroidism, unspecified: Secondary | ICD-10-CM | POA: Diagnosis not present

## 2021-08-23 DIAGNOSIS — E782 Mixed hyperlipidemia: Secondary | ICD-10-CM | POA: Diagnosis not present

## 2021-08-23 DIAGNOSIS — I350 Nonrheumatic aortic (valve) stenosis: Secondary | ICD-10-CM | POA: Diagnosis not present

## 2021-08-24 DIAGNOSIS — N184 Chronic kidney disease, stage 4 (severe): Secondary | ICD-10-CM | POA: Diagnosis not present

## 2021-08-24 DIAGNOSIS — D631 Anemia in chronic kidney disease: Secondary | ICD-10-CM | POA: Diagnosis not present

## 2021-08-24 DIAGNOSIS — E1122 Type 2 diabetes mellitus with diabetic chronic kidney disease: Secondary | ICD-10-CM | POA: Diagnosis not present

## 2021-08-24 DIAGNOSIS — R809 Proteinuria, unspecified: Secondary | ICD-10-CM | POA: Diagnosis not present

## 2021-08-24 DIAGNOSIS — I129 Hypertensive chronic kidney disease with stage 1 through stage 4 chronic kidney disease, or unspecified chronic kidney disease: Secondary | ICD-10-CM | POA: Diagnosis not present

## 2021-08-24 DIAGNOSIS — N2581 Secondary hyperparathyroidism of renal origin: Secondary | ICD-10-CM | POA: Diagnosis not present

## 2021-08-30 DIAGNOSIS — J209 Acute bronchitis, unspecified: Secondary | ICD-10-CM | POA: Diagnosis not present

## 2021-09-01 ENCOUNTER — Other Ambulatory Visit: Payer: Self-pay

## 2021-09-01 ENCOUNTER — Emergency Department: Payer: HMO

## 2021-09-01 ENCOUNTER — Emergency Department
Admission: EM | Admit: 2021-09-01 | Discharge: 2021-09-01 | Disposition: A | Discharge status: Home / Self Care | Payer: HMO | Attending: Emergency Medicine | Admitting: Emergency Medicine

## 2021-09-01 DIAGNOSIS — I129 Hypertensive chronic kidney disease with stage 1 through stage 4 chronic kidney disease, or unspecified chronic kidney disease: Secondary | ICD-10-CM | POA: Insufficient documentation

## 2021-09-01 DIAGNOSIS — Z794 Long term (current) use of insulin: Secondary | ICD-10-CM | POA: Insufficient documentation

## 2021-09-01 DIAGNOSIS — E1165 Type 2 diabetes mellitus with hyperglycemia: Secondary | ICD-10-CM | POA: Diagnosis not present

## 2021-09-01 DIAGNOSIS — E1122 Type 2 diabetes mellitus with diabetic chronic kidney disease: Secondary | ICD-10-CM | POA: Diagnosis not present

## 2021-09-01 DIAGNOSIS — J40 Bronchitis, not specified as acute or chronic: Secondary | ICD-10-CM | POA: Diagnosis not present

## 2021-09-01 DIAGNOSIS — R739 Hyperglycemia, unspecified: Secondary | ICD-10-CM

## 2021-09-01 DIAGNOSIS — I10 Essential (primary) hypertension: Secondary | ICD-10-CM | POA: Diagnosis not present

## 2021-09-01 DIAGNOSIS — E039 Hypothyroidism, unspecified: Secondary | ICD-10-CM | POA: Diagnosis not present

## 2021-09-01 DIAGNOSIS — Z79899 Other long term (current) drug therapy: Secondary | ICD-10-CM | POA: Diagnosis not present

## 2021-09-01 DIAGNOSIS — N183 Chronic kidney disease, stage 3 unspecified: Secondary | ICD-10-CM | POA: Diagnosis not present

## 2021-09-01 DIAGNOSIS — Z7984 Long term (current) use of oral hypoglycemic drugs: Secondary | ICD-10-CM | POA: Insufficient documentation

## 2021-09-01 DIAGNOSIS — Z7982 Long term (current) use of aspirin: Secondary | ICD-10-CM | POA: Insufficient documentation

## 2021-09-01 DIAGNOSIS — R0602 Shortness of breath: Secondary | ICD-10-CM | POA: Diagnosis not present

## 2021-09-01 DIAGNOSIS — R059 Cough, unspecified: Secondary | ICD-10-CM | POA: Diagnosis not present

## 2021-09-01 LAB — URINALYSIS, ROUTINE W REFLEX MICROSCOPIC
Bilirubin Urine: NEGATIVE
Glucose, UA: >1000 mg/dL — AB
Ketones, ur: NEGATIVE mg/dL
Leukocytes,Ua: NEGATIVE
Nitrite: NEGATIVE
Protein, ur: 30 mg/dL — AB
Specific Gravity, Urine: 1.01 (ref 1.005–1.030)
pH: 5.5 (ref 5.0–8.0)

## 2021-09-01 LAB — CBC WITH DIFFERENTIAL/PLATELET
Abs Immature Granulocytes: 0.06 10*3/uL (ref 0.00–0.07)
Basophils Absolute: 0 10*3/uL (ref 0.0–0.1)
Basophils Relative: 0 %
Eosinophils Absolute: 0 10*3/uL (ref 0.0–0.5)
Eosinophils Relative: 0 %
HCT: 32.7 % — ABNORMAL LOW (ref 36.0–46.0)
Hemoglobin: 11 g/dL — ABNORMAL LOW (ref 12.0–15.0)
Immature Granulocytes: 1 %
Lymphocytes Relative: 9 %
Lymphs Abs: 1 10*3/uL (ref 0.7–4.0)
MCH: 29.6 pg (ref 26.0–34.0)
MCHC: 33.6 g/dL (ref 30.0–36.0)
MCV: 88.1 fL (ref 80.0–100.0)
Monocytes Absolute: 0.8 10*3/uL (ref 0.1–1.0)
Monocytes Relative: 7 %
Neutro Abs: 10.1 10*3/uL — ABNORMAL HIGH (ref 1.7–7.7)
Neutrophils Relative %: 83 %
Platelets: 342 10*3/uL (ref 150–400)
RBC: 3.71 MIL/uL — ABNORMAL LOW (ref 3.87–5.11)
RDW: 12 % (ref 11.5–15.5)
WBC: 12.1 10*3/uL — ABNORMAL HIGH (ref 4.0–10.5)
nRBC: 0 % (ref 0.0–0.2)

## 2021-09-01 LAB — URINALYSIS, MICROSCOPIC (REFLEX): Bacteria, UA: NONE SEEN

## 2021-09-01 LAB — COMPREHENSIVE METABOLIC PANEL
ALT: 19 U/L (ref 0–44)
AST: 16 U/L (ref 15–41)
Albumin: 3.8 g/dL (ref 3.5–5.0)
Alkaline Phosphatase: 85 U/L (ref 38–126)
Anion gap: 12 (ref 5–15)
BUN: 36 mg/dL — ABNORMAL HIGH (ref 8–23)
CO2: 25 mmol/L (ref 22–32)
Calcium: 9.8 mg/dL (ref 8.9–10.3)
Chloride: 100 mmol/L (ref 98–111)
Creatinine, Ser: 1.73 mg/dL — ABNORMAL HIGH (ref 0.44–1.00)
GFR, Estimated: 29 mL/min — ABNORMAL LOW (ref >60)
Glucose, Bld: 227 mg/dL — ABNORMAL HIGH (ref 70–99)
Potassium: 4.4 mmol/L (ref 3.5–5.1)
Sodium: 137 mmol/L (ref 135–145)
Total Bilirubin: 0.6 mg/dL (ref 0.3–1.2)
Total Protein: 8 g/dL (ref 6.5–8.1)

## 2021-09-01 LAB — MAGNESIUM: Magnesium: 1.9 mg/dL (ref 1.7–2.4)

## 2021-09-01 LAB — TROPONIN I (HIGH SENSITIVITY)
Troponin I (High Sensitivity): 13 ng/L (ref <18)
Troponin I (High Sensitivity): 12 ng/L (ref <18)

## 2021-09-01 LAB — CBG MONITORING, ED: Glucose-Capillary: 220 mg/dL — ABNORMAL HIGH (ref 70–99)

## 2021-09-01 MED ORDER — GUAIFENESIN ER 600 MG PO TB12: 600.0000 mg | ORAL_TABLET | Freq: Two times a day (BID) | ORAL | 0 refills | Status: AC

## 2021-09-01 MED ORDER — ALBUTEROL SULFATE HFA 108 (90 BASE) MCG/ACT IN AERS: 2.0000 | INHALATION_SPRAY | Freq: Four times a day (QID) | RESPIRATORY_TRACT | 0 refills | Status: DC | PRN

## 2021-09-01 MED ORDER — SODIUM CHLORIDE 0.9 % IV BOLUS
500.0000 mL | Freq: Once | INTRAVENOUS | Status: AC
  Administered 2021-09-01: 500 mL via INTRAVENOUS

## 2021-09-01 NOTE — ED Provider Notes (Signed)
HPI: Pt is a 80 y.o. female who presents with complaints of high sugar.   The patient with higher sugars but had antibiotics and steroids started Tuesday. Pt reports feeling week. Some SOB no chest pain.   ROS: Denies fever, chest pain, vomiting  Past Medical History:  Diagnosis Date   Anemia    Aortic stenosis, severe    a. 09/2017: s/p TAVR   CKD (chronic kidney disease)    Diabetes mellitus, insulin dependent (IDDM), uncontrolled    DR (diabetic retinopathy) ([place])    GERD (gastroesophageal reflux disease)    Glaucoma    Hyperlipidemia    Hypertension    Hypothyroidism    Obesity    S/P TAVR (transcatheter aortic valve replacement) 09/04/2017   26 mm Medtronic Evolut Pro transcatheter heart valve placed via percutaneous right transfemoral approach    There were no vitals filed for this visit.  Focused Physical Exam: Gen: No acute distress Head: atraumatic, normocephalic Eyes: Extraocular movements grossly intact; conjunctiva clear CV: RRR Lung: No increased WOB, no stridor GI: ND, no obvious masses Neuro: Alert and awake  Medical Decision Making and Plan: Given the patient's initial medical screening exam, the following diagnostic evaluation has been ordered. The patient will be placed in the appropriate treatment space, once one is available, to complete the evaluation and treatment. I have discussed the plan of care with the patient and I have advised the patient that an ED physician or mid-level practitioner will reevaluate their condition after the test results have been received, as the results may give them additional insight into the type of treatment they may need.   Diagnostics: labs, xray, recent covid negative   Treatments: none immediately   Vanessa Riverside, MD 09/01/21 1544

## 2021-09-01 NOTE — ED Provider Notes (Signed)
[facility] Emergency Department Provider Note   ____________________________________________   Event Date/Time   First MD Initiated Contact with Patient 09/01/21 1742     (approximate)  I have reviewed the triage vital signs and the nursing notes.   HISTORY  Chief Complaint Hyperglycemia    HPI Stacey Pierce is a 80 y.o. female with past medical history of hypertension, hyperlipidemia, diabetes, CKD, and chronic back pain who presents to the ED complaining of hyperglycemia and cough.  Patient reports that she initially developed a cough 6 days ago, along with some mild difficulty breathing.  She spoke with her PCPs office 4 days ago, was called in a prescription for antibiotics as well as prednisone.  She states that her cough has been gradually improving since then, but that she has been feeling very weak and her blood sugars have been running as high as 500.  She states she has been taking both her long and short acting insulin as usual.  She denies any fevers, chest pain, nausea, vomiting, dysuria, or abdominal pain.        Past Medical History:  Diagnosis Date   Anemia    Aortic stenosis, severe    a. 09/2017: s/p TAVR   CKD (chronic kidney disease)    Diabetes mellitus, insulin dependent (IDDM), uncontrolled    DR (diabetic retinopathy) ([place])    GERD (gastroesophageal reflux disease)    Glaucoma    Hyperlipidemia    Hypertension    Hypothyroidism    Obesity    S/P TAVR (transcatheter aortic valve replacement) 09/04/2017   26 mm Medtronic Evolut Pro transcatheter heart valve placed via percutaneous right transfemoral approach     Patient Active Problem List   Diagnosis Date Noted   Venous insufficiency of both lower extremities 06/13/2018   Iron deficiency anemia due to chronic blood loss 04/10/2018   B12 deficiency anemia 03/13/2018   Dizziness 01/10/2018   Obesity    S/P TAVR (transcatheter aortic valve replacement) 09/04/2017    Aortic stenosis, severe 07/30/2017   Allergic rhinitis 08/26/2015   Airway hyperreactivity 08/26/2015   Back pain, chronic 08/26/2015   Diabetes ([place]) 08/26/2015   Degeneration of lumbar or lumbosacral intervertebral disc 08/26/2015   Acid reflux 08/26/2015   Glaucoma 08/26/2015   HLD (hyperlipidemia) 08/26/2015   Adiposity 08/26/2015   Benign essential HTN 04/29/2015   Arteriosclerosis of coronary artery 01/26/2015   Retinopathy 11/19/2014   Chronic kidney disease (CKD), stage III (moderate) ([place]) 11/19/2014   Long-term insulin use ([place]) 11/19/2014   Adult hypothyroidism 02/17/2014    Past Surgical History:  Procedure Laterality Date   CATARACT EXTRACTION     left 2010   CATARACT EXTRACTION W/PHACO Right 10/13/2015   Procedure: CATARACT EXTRACTION PHACO AND INTRAOCULAR LENS PLACEMENT ([place]);  Surgeon: Leandrew Koyanagi, MD;  Location: [place];  Service: Ophthalmology;  Laterality: Right;  DIABETIC - insulin and oral meds [place]   COLONOSCOPY WITH PROPOFOL N/A 05/16/2018   Procedure: COLONOSCOPY WITH PROPOFOL;  Surgeon: Lin Landsman, MD;  Location: [facility] ENDOSCOPY;  Service: Gastroenterology;  Laterality: N/A;   DILATION AND CURETTAGE OF UTERUS     ESOPHAGOGASTRODUODENOSCOPY (EGD) WITH PROPOFOL N/A 05/16/2018   Procedure: ESOPHAGOGASTRODUODENOSCOPY (EGD) WITH PROPOFOL;  Surgeon: Lin Landsman, MD;  Location: [facility] ENDOSCOPY;  Service: Gastroenterology;  Laterality: N/A;   EYE SURGERY     LAPAROSCOPIC TUBAL LIGATION     RIGHT/LEFT HEART CATH AND  CORONARY ANGIOGRAPHY N/A 08/02/2017   Procedure: RIGHT/LEFT HEART CATH AND CORONARY ANGIOGRAPHY;  Surgeon: Corey Skains, MD;  Location: [place] CV LAB;  Service: Cardiovascular;  Laterality: N/A;   TEE WITHOUT CARDIOVERSION N/A 09/04/2017   Procedure: TRANSESOPHAGEAL ECHOCARDIOGRAM (TEE);  Surgeon: Sherren Mocha, MD;  Location: [place];  Service: Open Heart Surgery;   Laterality: N/A;   TONSILLECTOMY     1974   TRANSCATHETER AORTIC VALVE REPLACEMENT, TRANSFEMORAL N/A 09/04/2017   Procedure: TRANSCATHETER AORTIC VALVE REPLACEMENT, TRANSFEMORAL;  Surgeon: Sherren Mocha, MD;  Location: [place];  Service: Open Heart Surgery;  Laterality: N/A;   TUBAL LIGATION      Prior to Admission medications   Medication Sig Start Date End Date Taking? Authorizing Provider  albuterol (VENTOLIN HFA) 108 (90 Base) MCG/ACT inhaler Inhale 2 puffs into the lungs every 6 (six) hours as needed for wheezing or shortness of breath. 09/01/21  Yes Blake Divine, MD  guaiFENesin (MUCINEX) 600 MG 12 hr tablet Take 1 tablet (600 mg total) by mouth 2 (two) times daily for 7 days. 09/01/21 09/08/21 Yes Blake Divine, MD  acetaminophen (TYLENOL) 500 MG tablet Take 1,000 mg by mouth every 6 (six) hours as needed for mild pain.    [provider]  amitriptyline (ELAVIL) 10 MG tablet Take 10 mg by mouth at bedtime.    [provider]  aspirin EC 81 MG tablet Take 81 mg by mouth daily.    [provider]  atorvastatin (LIPITOR) 40 MG tablet TAKE 1 TABLET (40 MG TOTAL) BY MOUTH DAILY. 06/08/21   Jerrol Banana., MD  BD INSULIN SYRINGE U/F 31G X 5/16" 1 ML MISC 4 (four) times daily. 12/25/17   [provider]  carvedilol (COREG) 25 MG tablet TAKE 1 TABLET BY MOUTH 2 TIMES DAILY WITH A MEAL. 08/03/21   Jerrol Banana., MD  cephALEXin (KEFLEX) 500 MG capsule Take 1 capsule (500 mg total) by mouth 2 (two) times daily. 07/02/21   Lavonia Drafts, MD  clindamycin (CLEOCIN) 300 MG capsule Take 2 capsules (600 mg) one hour prior to all dental visits. 12/24/19   Eileen Stanford, PA-C  DiphenhydrAMINE HCl ([facility] ALLERGY PO) Take 1 tablet by mouth daily.    [provider]  dorzolamide-timolol (COSOPT) 22.3-6.8 MG/ML ophthalmic solution Place 1 drop into both eyes 2 (two) times daily. 07/10/16   [provider]  doxycycline (VIBRA-TABS) 100 MG  tablet Take 1 tablet (100 mg total) by mouth 2 (two) times daily. 02/08/21   Jerrol Banana., MD  doxycycline (VIBRA-TABS) 100 MG tablet Take 1 tablet (100 mg total) by mouth 2 (two) times daily. 04/20/21   Jerrol Banana., MD  gabapentin (NEURONTIN) 300 MG capsule Take 2 capsules by mouth.  12/06/18 08/10/20  [provider]  glucose blood test strip Use three test strips daily as instructed. 04/20/17   [provider]  hydrALAZINE (APRESOLINE) 25 MG tablet Take 1 tablet (25 mg total) by mouth 2 times daily at 12 noon and 4 pm. Patient not taking: Reported on 11/04/2020 08/26/20   Jerrol Banana., MD  insulin aspart (NOVOLOG) 100 UNIT/ML injection Inject 20-25 Units into the skin 2 (two) times daily before a meal. Takes 20 units before breakfast and 25 units before dinner.    [provider]  insulin detemir (LEVEMIR) 100 UNIT/ML injection Inject 20 units into the skin in the morning and 55 units at bedtime    [provider]  latanoprost (XALATAN) 0.005 % ophthalmic solution Place 1 drop into both eyes at bedtime.     [provider]  levothyroxine (SYNTHROID) 112 MCG tablet TAKE 1 TABLET BY MOUTH DAILY BEFORE BREAKFAST. 08/03/21   Jerrol Banana., MD  losartan (COZAAR) 100 MG tablet TAKE 1 TABLET BY MOUTH EVERY DAY 07/04/21   Jerrol Banana., MD  metFORMIN (GLUCOPHAGE) 1000 MG tablet Take 2,000 mg by mouth daily after supper. 12/31/15   [provider]  pantoprazole (PROTONIX) 40 MG tablet TAKE 1 TABLET (40 MG TOTAL) BY MOUTH DAILY AT 12 NOON. 11/18/18   Eileen Stanford, PA-C  predniSONE (DELTASONE) 20 MG tablet Take 1 tablet (20 mg total) by mouth daily with breakfast. 02/08/21   Jerrol Banana., MD  torsemide (DEMADEX) 10 MG tablet Take 10 mg by mouth daily. 09/02/20   [provider]    Allergies Hydrocodone bit-homatrop mbr, Azithromycin, Codeine, Erythromycin, Penicillins, and Sulfa  antibiotics  Family History  Problem Relation Age of Onset   Alzheimer's disease Mother    Colon polyps Mother    Hypertension Mother    Heart attack Father    Coronary artery disease Father    Diabetes Father    Breast cancer Sister    Colon polyps Sister    Diabetes Sister    Diabetes Sister    Diabetes Sister    Diabetes Sister     Social History Social History   Tobacco Use   Smoking status: Never   Smokeless tobacco: Never  Vaping Use   Vaping Use: Never used  Substance Use Topics   Alcohol use: No    Alcohol/week: 0.0 standard drinks   Drug use: Never    Review of Systems  Constitutional: No fever/chills.  Positive for generalized weakness and fatigue. Eyes: No visual changes. ENT: No sore throat. Cardiovascular: Denies chest pain. Respiratory: Positive for cough and shortness of breath. Gastrointestinal: No abdominal pain.  No nausea, no vomiting.  No diarrhea.  No constipation. Genitourinary: Negative for dysuria. Musculoskeletal: Negative for back pain. Skin: Negative for rash. Neurological: Negative for headaches, focal weakness or numbness.  ____________________________________________   PHYSICAL EXAM:  VITAL SIGNS: ED Triage Vitals  Enc Vitals Group     BP 09/01/21 1544 (!) 221/73     Pulse Rate 09/01/21 1544 68     Resp 09/01/21 1544 17     Temp 09/01/21 1544 98 F (36.7 C)     Temp Source 09/01/21 1544 Oral     SpO2 09/01/21 1544 96 %     Weight 09/01/21 1545 188 lb (85.3 kg)     Height 09/01/21 1545 5\' 5"  (1.651 m)     Head Circumference --      Peak Flow --      Pain Score 09/01/21 1545 0     Pain Loc --      Pain Edu? --      Excl. in [place]? --     Constitutional: Alert and oriented. Eyes: Conjunctivae are normal. Head: Atraumatic. Nose: No congestion/rhinnorhea. Mouth/Throat: Mucous membranes are moist. Neck: Normal ROM Cardiovascular: Normal rate, regular rhythm. Grossly normal heart sounds.  2+ radial pulses  bilaterally. Respiratory: Normal respiratory effort.  No retractions. Lungs CTAB. Gastrointestinal: Soft and nontender. No distention. Genitourinary: deferred Musculoskeletal: No lower extremity tenderness nor edema. Neurologic:  Normal speech and language. No gross focal neurologic deficits are appreciated. Skin:  Skin is warm, dry and intact. No rash noted. Psychiatric:  Mood and affect are normal. Speech and behavior are normal.  ____________________________________________   LABS (all labs ordered are listed, but only abnormal results are displayed)  Labs Reviewed  CBC WITH DIFFERENTIAL/PLATELET - Abnormal; Notable for the following components:      Result Value   WBC 12.1 (*)    RBC 3.71 (*)    Hemoglobin 11.0 (*)    HCT 32.7 (*)    Neutro Abs 10.1 (*)    All other components within normal limits  COMPREHENSIVE METABOLIC PANEL - Abnormal; Notable for the following components:   Glucose, Bld 227 (*)    BUN 36 (*)    Creatinine, Ser 1.73 (*)    GFR, Estimated 29 (*)    All other components within normal limits  URINALYSIS, ROUTINE W REFLEX MICROSCOPIC - Abnormal; Notable for the following components:   Glucose, UA >1,000 (*)    Hgb urine dipstick TRACE (*)    Protein, ur 30 (*)    All other components within normal limits  CBG MONITORING, ED - Abnormal; Notable for the following components:   Glucose-Capillary 220 (*)    All other components within normal limits  MAGNESIUM  URINALYSIS, MICROSCOPIC (REFLEX)  TROPONIN I (HIGH SENSITIVITY)  TROPONIN I (HIGH SENSITIVITY)   ____________________________________________  EKG  ED ECG REPORT I, Blake Divine, the attending physician, personally viewed and interpreted this ECG.   Date: 09/01/2021  EKG Time: 15:47  Rate: 61  Rhythm: normal sinus rhythm  Axis: LAD  Intervals:none  ST&T Change: LVH   PROCEDURES  Procedure(s) performed (including Critical  Care):  Procedures   ____________________________________________   INITIAL IMPRESSION / ASSESSMENT AND PLAN / ED COURSE      80 year old female with past medical history of hypertension, hyperlipidemia, diabetes, CKD, and chronic back pain who presents to the ED complaining of ongoing cough for the past 6 days associated with hyperglycemia after being prescribed prednisone by her PCP.  Blood sugars mildly elevated in the ED but there are no signs of DKA or other electrolyte abnormality.  Patient was hydrated with IV fluids and blood glucose is now improving.  Work-up for her cough and mild difficulty breathing is unremarkable.  EKG shows no evidence of arrhythmia or ischemia and 2 sets of troponin are negative.  Chest x-ray reviewed by me and shows no infiltrate, edema, or effusion.  I suspect patient symptoms are due to a viral bronchitis, she reports testing negative for COVID-19 earlier in the week.  She is appropriate for discharge home and was counseled to stop the prednisone, we will prescribe albuterol and Mucinex instead for her cough.  She was counseled to return to the ED for new or worsening symptoms, otherwise to follow-up closely with her PCP.  Patient agrees with plan.      ____________________________________________   FINAL CLINICAL IMPRESSION(S) / ED DIAGNOSES  Final diagnoses:  Hyperglycemia  Bronchitis     ED Discharge Orders          Ordered    albuterol (VENTOLIN HFA) 108 (90 Base) MCG/ACT inhaler  Every 6 hours PRN       Note to Pharmacy: Please supply with spacer   09/01/21 1813    guaiFENesin (MUCINEX) 600 MG 12 hr tablet  2 times daily        09/01/21 1813             Note:  This document was prepared using Dragon voice recognition software and may include unintentional dictation errors.  Blake Divine, MD 09/01/21 [phone]

## 2021-09-01 NOTE — ED Triage Notes (Signed)
Pt states she has had a cough with congestion and was given a prescription for abx and prednisone on Tuesday, states since her glucose has been 400-500 , feeling lightheaded/swimmy.generalized weakness.

## 2021-09-01 NOTE — ED Notes (Signed)
Pt reports weakness, cough and elevated blood sugar after taking prednisone which she has stopped now.   No v/d.  No chest pain sob.  States negative covid this week.  No fever.  Nsr on monitor.  Pt alert speech clear.  Family with pt

## 2021-09-12 ENCOUNTER — Other Ambulatory Visit: Payer: Self-pay | Admitting: Family Medicine

## 2021-09-12 NOTE — Telephone Encounter (Signed)
Requested medication (s) are due for refill today: yes  Requested medication (s) are on the active medication list: yes  Last refill:  06/08/21 #90 0 refills  Future visit scheduled: no   Notes to clinic:  last labs 08/27/20 do you want to continue refills?     Requested Prescriptions  Pending Prescriptions Disp Refills   atorvastatin (LIPITOR) 40 MG tablet [Pharmacy Med Name: ATORVASTATIN 40 MG TABLET] 90 tablet 0    Sig: TAKE 1 TABLET BY MOUTH EVERY DAY     Cardiovascular:  Antilipid - Statins Failed - 09/12/2021  1:30 AM      Failed - Total Cholesterol in normal range and within 360 days    Cholesterol, Total  Date Value Ref Range Status  08/27/2020 161 100 - 199 mg/dL Final          Failed - LDL in normal range and within 360 days    LDL Cholesterol (Calc)  Date Value Ref Range Status  11/19/2017 82 mg/dL (calc) Final    Comment:    Reference range: <100 . Desirable range <100 mg/dL for primary prevention;   <70 mg/dL for patients with CHD or diabetic patients  with > or = 2 CHD risk factors. Marland Kitchen LDL-C is now calculated using the Martin-Hopkins  calculation, which is a validated novel method providing  better accuracy than the Friedewald equation in the  estimation of LDL-C.  Cresenciano Genre et al. Annamaria Helling. 9147;829(56): 2061-2068  (http://education.[facility].com/faq/FAQ164)    LDL Chol Calc (NIH)  Date Value Ref Range Status  08/27/2020 86 0 - 99 mg/dL Final          Failed - HDL in normal range and within 360 days    HDL  Date Value Ref Range Status  08/27/2020 42 >39 mg/dL Final          Failed - Triglycerides in normal range and within 360 days    Triglycerides  Date Value Ref Range Status  08/27/2020 197 (H) 0 - 149 mg/dL Final          Passed - Patient is not pregnant      Passed - Valid encounter within last 12 months    Recent Outpatient Visits           4 months ago Cough   [facility] Jerrol Banana., MD   4 months  ago Community acquired pneumonia, unspecified laterality   [facility] Jerrol Banana., MD   7 months ago Cough   [facility] Jerrol Banana., MD   10 months ago Arteriosclerosis of coronary artery   [facility] Jerrol Banana., MD   1 year ago Left hip pain   [facility] Collet [place], [place]

## 2021-09-21 DIAGNOSIS — M47816 Spondylosis without myelopathy or radiculopathy, lumbar region: Secondary | ICD-10-CM | POA: Diagnosis not present

## 2021-09-28 DIAGNOSIS — E1122 Type 2 diabetes mellitus with diabetic chronic kidney disease: Secondary | ICD-10-CM | POA: Diagnosis not present

## 2021-09-28 DIAGNOSIS — N184 Chronic kidney disease, stage 4 (severe): Secondary | ICD-10-CM | POA: Diagnosis not present

## 2021-09-28 DIAGNOSIS — I129 Hypertensive chronic kidney disease with stage 1 through stage 4 chronic kidney disease, or unspecified chronic kidney disease: Secondary | ICD-10-CM | POA: Diagnosis not present

## 2021-09-28 DIAGNOSIS — N2581 Secondary hyperparathyroidism of renal origin: Secondary | ICD-10-CM | POA: Diagnosis not present

## 2021-09-28 DIAGNOSIS — R809 Proteinuria, unspecified: Secondary | ICD-10-CM | POA: Diagnosis not present

## 2021-09-28 DIAGNOSIS — D631 Anemia in chronic kidney disease: Secondary | ICD-10-CM | POA: Diagnosis not present

## 2021-10-05 DIAGNOSIS — D631 Anemia in chronic kidney disease: Secondary | ICD-10-CM | POA: Diagnosis not present

## 2021-10-05 DIAGNOSIS — N2581 Secondary hyperparathyroidism of renal origin: Secondary | ICD-10-CM | POA: Diagnosis not present

## 2021-10-05 DIAGNOSIS — N184 Chronic kidney disease, stage 4 (severe): Secondary | ICD-10-CM | POA: Diagnosis not present

## 2021-10-05 DIAGNOSIS — I129 Hypertensive chronic kidney disease with stage 1 through stage 4 chronic kidney disease, or unspecified chronic kidney disease: Secondary | ICD-10-CM | POA: Diagnosis not present

## 2021-10-05 DIAGNOSIS — E1122 Type 2 diabetes mellitus with diabetic chronic kidney disease: Secondary | ICD-10-CM | POA: Diagnosis not present

## 2021-10-05 DIAGNOSIS — R809 Proteinuria, unspecified: Secondary | ICD-10-CM | POA: Diagnosis not present

## 2021-10-06 ENCOUNTER — Other Ambulatory Visit: Payer: Self-pay | Admitting: Family Medicine

## 2021-10-06 NOTE — Telephone Encounter (Signed)
Patient will need an office visit for further refills. Courtesy refill. Requested Prescriptions  Pending Prescriptions Disp Refills  . losartan (COZAAR) 100 MG tablet [Pharmacy Med Name: LOSARTAN POTASSIUM 100 MG TAB] 90 tablet 0    Sig: TAKE 1 TABLET BY MOUTH EVERY DAY     Cardiovascular:  Angiotensin Receptor Blockers Failed - 10/06/2021  1:53 AM      Failed - Cr in normal range and within 180 days    Creat  Date Value Ref Range Status  06/08/2015 1.13 (H) 0.50 - 1.10 mg/dL Final   Creatinine, Ser  Date Value Ref Range Status  09/01/2021 1.73 (H) 0.44 - 1.00 mg/dL Final         Failed - Last BP in normal range    BP Readings from Last 1 Encounters:  09/01/21 (!) 192/66         Passed - K in normal range and within 180 days    Potassium  Date Value Ref Range Status  09/01/2021 4.4 3.5 - 5.1 mmol/L Final         Passed - Patient is not pregnant      Passed - Valid encounter within last 6 months    Recent Outpatient Visits          4 months ago Cough   [facility] Jerrol Banana., MD   5 months ago Community acquired pneumonia, unspecified laterality   [facility] Jerrol Banana., MD   8 months ago Cough   [facility] Jerrol Banana., MD   11 months ago Arteriosclerosis of coronary artery   [facility] Jerrol Banana., MD   1 year ago Left hip pain   [facility] [place], [place]

## 2021-10-12 DIAGNOSIS — E11319 Type 2 diabetes mellitus with unspecified diabetic retinopathy without macular edema: Secondary | ICD-10-CM | POA: Diagnosis not present

## 2021-10-12 DIAGNOSIS — E1169 Type 2 diabetes mellitus with other specified complication: Secondary | ICD-10-CM | POA: Diagnosis not present

## 2021-10-12 DIAGNOSIS — E785 Hyperlipidemia, unspecified: Secondary | ICD-10-CM | POA: Diagnosis not present

## 2021-10-12 DIAGNOSIS — N184 Chronic kidney disease, stage 4 (severe): Secondary | ICD-10-CM | POA: Diagnosis not present

## 2021-10-12 DIAGNOSIS — R809 Proteinuria, unspecified: Secondary | ICD-10-CM | POA: Diagnosis not present

## 2021-10-12 DIAGNOSIS — E1122 Type 2 diabetes mellitus with diabetic chronic kidney disease: Secondary | ICD-10-CM | POA: Diagnosis not present

## 2021-10-12 DIAGNOSIS — Z794 Long term (current) use of insulin: Secondary | ICD-10-CM | POA: Diagnosis not present

## 2021-10-12 DIAGNOSIS — E1142 Type 2 diabetes mellitus with diabetic polyneuropathy: Secondary | ICD-10-CM | POA: Diagnosis not present

## 2021-10-12 DIAGNOSIS — E1129 Type 2 diabetes mellitus with other diabetic kidney complication: Secondary | ICD-10-CM | POA: Diagnosis not present

## 2021-10-25 ENCOUNTER — Other Ambulatory Visit: Payer: Self-pay | Admitting: Family Medicine

## 2021-10-25 DIAGNOSIS — I10 Essential (primary) hypertension: Secondary | ICD-10-CM

## 2021-10-25 NOTE — Telephone Encounter (Signed)
Requested Prescriptions  Refused Prescriptions Disp Refills  . carvedilol (COREG) 25 MG tablet [Pharmacy Med Name: CARVEDILOL 25 MG TABLET] 180 tablet 0    Sig: TAKE 1 TABLET BY MOUTH 2 TIMES DAILY WITH A MEAL.     Cardiovascular:  Beta Blockers Failed - 10/25/2021  1:33 PM      Failed - Last BP in normal range    BP Readings from Last 1 Encounters:  09/01/21 (!) 192/66         Passed - Last Heart Rate in normal range    Pulse Readings from Last 1 Encounters:  09/01/21 67         Passed - Valid encounter within last 6 months    Recent Outpatient Visits          5 months ago Cough   [facility] Jerrol Banana., MD   6 months ago Community acquired pneumonia, unspecified laterality   [facility] Jerrol Banana., MD   8 months ago Cough   [facility] Jerrol Banana., MD   11 months ago Arteriosclerosis of coronary artery   [facility] Jerrol Banana., MD   1 year ago Left hip pain   [facility] [place], [place]

## 2021-11-07 DIAGNOSIS — M5416 Radiculopathy, lumbar region: Secondary | ICD-10-CM | POA: Diagnosis not present

## 2021-11-07 DIAGNOSIS — M5126 Other intervertebral disc displacement, lumbar region: Secondary | ICD-10-CM | POA: Diagnosis not present

## 2021-11-07 DIAGNOSIS — M48062 Spinal stenosis, lumbar region with neurogenic claudication: Secondary | ICD-10-CM | POA: Diagnosis not present

## 2021-11-18 DIAGNOSIS — N184 Chronic kidney disease, stage 4 (severe): Secondary | ICD-10-CM | POA: Diagnosis not present

## 2021-11-18 DIAGNOSIS — E785 Hyperlipidemia, unspecified: Secondary | ICD-10-CM | POA: Diagnosis not present

## 2021-11-18 DIAGNOSIS — E11319 Type 2 diabetes mellitus with unspecified diabetic retinopathy without macular edema: Secondary | ICD-10-CM | POA: Diagnosis not present

## 2021-11-18 DIAGNOSIS — E1122 Type 2 diabetes mellitus with diabetic chronic kidney disease: Secondary | ICD-10-CM | POA: Diagnosis not present

## 2021-11-18 DIAGNOSIS — E1142 Type 2 diabetes mellitus with diabetic polyneuropathy: Secondary | ICD-10-CM | POA: Diagnosis not present

## 2021-11-18 DIAGNOSIS — Z794 Long term (current) use of insulin: Secondary | ICD-10-CM | POA: Diagnosis not present

## 2021-11-18 DIAGNOSIS — E1169 Type 2 diabetes mellitus with other specified complication: Secondary | ICD-10-CM | POA: Diagnosis not present

## 2021-11-18 DIAGNOSIS — R809 Proteinuria, unspecified: Secondary | ICD-10-CM | POA: Diagnosis not present

## 2021-11-18 DIAGNOSIS — E1129 Type 2 diabetes mellitus with other diabetic kidney complication: Secondary | ICD-10-CM | POA: Diagnosis not present

## 2021-12-11 ENCOUNTER — Other Ambulatory Visit: Payer: Self-pay | Admitting: Family Medicine

## 2021-12-11 NOTE — Telephone Encounter (Signed)
Requested medication (s) are due for refill today: no  Requested medication (s) are on the active medication list: yes  Last refill:  09/14/21 #90  Future visit scheduled: no  Notes to clinic:  overdue lab work   Requested Prescriptions  Pending Prescriptions Disp Refills   atorvastatin (LIPITOR) 40 MG tablet [Pharmacy Med Name: ATORVASTATIN 40 MG TABLET] 90 tablet 0    Sig: TAKE 1 TABLET BY MOUTH EVERY DAY     Cardiovascular:  Antilipid - Statins Failed - 12/11/2021  9:32 AM      Failed - Total Cholesterol in normal range and within 360 days    Cholesterol, Total  Date Value Ref Range Status  08/27/2020 161 100 - 199 mg/dL Final          Failed - LDL in normal range and within 360 days    LDL Cholesterol (Calc)  Date Value Ref Range Status  11/19/2017 82 mg/dL (calc) Final    Comment:    Reference range: <100 . Desirable range <100 mg/dL for primary prevention;   <70 mg/dL for patients with CHD or diabetic patients  with > or = 2 CHD risk factors. Marland Kitchen LDL-C is now calculated using the Martin-Hopkins  calculation, which is a validated novel method providing  better accuracy than the Friedewald equation in the  estimation of LDL-C.  Cresenciano Genre et al. Annamaria Helling. 1937;902(40): 2061-2068  (http://education.[facility].com/faq/FAQ164)    LDL Chol Calc (NIH)  Date Value Ref Range Status  08/27/2020 86 0 - 99 mg/dL Final          Failed - HDL in normal range and within 360 days    HDL  Date Value Ref Range Status  08/27/2020 42 >39 mg/dL Final          Failed - Triglycerides in normal range and within 360 days    Triglycerides  Date Value Ref Range Status  08/27/2020 197 (H) 0 - 149 mg/dL Final          Passed - Patient is not pregnant      Passed - Valid encounter within last 12 months    Recent Outpatient Visits           7 months ago Cough   [facility] Jerrol Banana., MD   7 months ago Community acquired pneumonia, unspecified  laterality   [facility] Jerrol Banana., MD   10 months ago Cough   [facility] Jerrol Banana., MD   1 year ago Arteriosclerosis of coronary artery   [facility] Jerrol Banana., MD   1 year ago Left hip pain   [facility] [place], [place]

## 2021-12-16 ENCOUNTER — Other Ambulatory Visit: Payer: Self-pay | Admitting: Family Medicine

## 2021-12-16 DIAGNOSIS — E039 Hypothyroidism, unspecified: Secondary | ICD-10-CM

## 2021-12-16 NOTE — Telephone Encounter (Signed)
Requested medication (s) are due for refill today:   Yes  Requested medication (s) are on the active medication list:   Yes  Future visit scheduled:   No   Last ordered: 08/03/2021 #90, 0 refills  Returned because protocol failed.   TSH due.     Requested Prescriptions  Pending Prescriptions Disp Refills   levothyroxine (SYNTHROID) 112 MCG tablet [Pharmacy Med Name: LEVOTHYROXINE 112 MCG TABLET] 90 tablet 0    Sig: TAKE 1 TABLET BY MOUTH EVERY DAY BEFORE BREAKFAST     Endocrinology:  Hypothyroid Agents Failed - 12/16/2021 12:08 PM      Failed - TSH needs to be rechecked within 3 months after an abnormal result. Refill until TSH is due.      Failed - TSH in normal range and within 360 days    TSH  Date Value Ref Range Status  08/27/2020 6.970 (H) 0.450 - 4.500 uIU/mL Final          Passed - Valid encounter within last 12 months    Recent Outpatient Visits           7 months ago Cough   [facility] Jerrol Banana., MD   8 months ago Community acquired pneumonia, unspecified laterality   [facility] Jerrol Banana., [facility]   10 months ago Cough   [facility] Jerrol Banana., MD   1 year ago Arteriosclerosis of coronary artery   [facility] Jerrol Banana., MD   1 year ago Left hip pain   [facility] [place] [place], [place]

## 2021-12-26 DIAGNOSIS — E782 Mixed hyperlipidemia: Secondary | ICD-10-CM | POA: Diagnosis not present

## 2021-12-26 DIAGNOSIS — N184 Chronic kidney disease, stage 4 (severe): Secondary | ICD-10-CM | POA: Diagnosis not present

## 2021-12-26 DIAGNOSIS — I10 Essential (primary) hypertension: Secondary | ICD-10-CM | POA: Diagnosis not present

## 2021-12-26 DIAGNOSIS — I2510 Atherosclerotic heart disease of native coronary artery without angina pectoris: Secondary | ICD-10-CM | POA: Diagnosis not present

## 2021-12-26 DIAGNOSIS — I350 Nonrheumatic aortic (valve) stenosis: Secondary | ICD-10-CM | POA: Diagnosis not present

## 2021-12-27 DIAGNOSIS — I350 Nonrheumatic aortic (valve) stenosis: Secondary | ICD-10-CM | POA: Diagnosis not present

## 2021-12-27 DIAGNOSIS — N1831 Chronic kidney disease, stage 3a: Secondary | ICD-10-CM | POA: Diagnosis not present

## 2021-12-27 DIAGNOSIS — E1122 Type 2 diabetes mellitus with diabetic chronic kidney disease: Secondary | ICD-10-CM | POA: Diagnosis not present

## 2021-12-27 DIAGNOSIS — Z794 Long term (current) use of insulin: Secondary | ICD-10-CM | POA: Diagnosis not present

## 2021-12-27 DIAGNOSIS — Z780 Asymptomatic menopausal state: Secondary | ICD-10-CM | POA: Diagnosis not present

## 2021-12-27 DIAGNOSIS — M7989 Other specified soft tissue disorders: Secondary | ICD-10-CM | POA: Diagnosis not present

## 2021-12-27 DIAGNOSIS — E039 Hypothyroidism, unspecified: Secondary | ICD-10-CM | POA: Diagnosis not present

## 2021-12-27 DIAGNOSIS — M25461 Effusion, right knee: Secondary | ICD-10-CM | POA: Diagnosis not present

## 2021-12-27 DIAGNOSIS — E782 Mixed hyperlipidemia: Secondary | ICD-10-CM | POA: Diagnosis not present

## 2022-01-02 ENCOUNTER — Telehealth: Payer: Self-pay | Admitting: Family Medicine

## 2022-01-02 NOTE — Telephone Encounter (Signed)
Stacey Pierce with [facility] called saying she has been trying to reach out to the pat for a couple of months but she won't return her call.  She wants to know if some one at the office could please tell patient it is to her advantage to talk with the nurses with her insurance to help her between visits.  CB#  [phone]

## 2022-01-03 DIAGNOSIS — E113293 Type 2 diabetes mellitus with mild nonproliferative diabetic retinopathy without macular edema, bilateral: Secondary | ICD-10-CM | POA: Diagnosis not present

## 2022-01-12 DIAGNOSIS — E1122 Type 2 diabetes mellitus with diabetic chronic kidney disease: Secondary | ICD-10-CM | POA: Diagnosis not present

## 2022-01-12 DIAGNOSIS — D631 Anemia in chronic kidney disease: Secondary | ICD-10-CM | POA: Diagnosis not present

## 2022-01-12 DIAGNOSIS — I129 Hypertensive chronic kidney disease with stage 1 through stage 4 chronic kidney disease, or unspecified chronic kidney disease: Secondary | ICD-10-CM | POA: Diagnosis not present

## 2022-01-12 DIAGNOSIS — N2581 Secondary hyperparathyroidism of renal origin: Secondary | ICD-10-CM | POA: Diagnosis not present

## 2022-01-12 DIAGNOSIS — N184 Chronic kidney disease, stage 4 (severe): Secondary | ICD-10-CM | POA: Diagnosis not present

## 2022-01-12 DIAGNOSIS — R809 Proteinuria, unspecified: Secondary | ICD-10-CM | POA: Diagnosis not present

## 2022-01-18 DIAGNOSIS — I129 Hypertensive chronic kidney disease with stage 1 through stage 4 chronic kidney disease, or unspecified chronic kidney disease: Secondary | ICD-10-CM | POA: Diagnosis not present

## 2022-01-18 DIAGNOSIS — D631 Anemia in chronic kidney disease: Secondary | ICD-10-CM | POA: Diagnosis not present

## 2022-01-18 DIAGNOSIS — R809 Proteinuria, unspecified: Secondary | ICD-10-CM | POA: Diagnosis not present

## 2022-01-18 DIAGNOSIS — N184 Chronic kidney disease, stage 4 (severe): Secondary | ICD-10-CM | POA: Diagnosis not present

## 2022-01-18 DIAGNOSIS — E1122 Type 2 diabetes mellitus with diabetic chronic kidney disease: Secondary | ICD-10-CM | POA: Diagnosis not present

## 2022-01-18 DIAGNOSIS — N2581 Secondary hyperparathyroidism of renal origin: Secondary | ICD-10-CM | POA: Diagnosis not present

## 2022-02-20 ENCOUNTER — Telehealth: Payer: Self-pay | Admitting: Family Medicine

## 2022-02-20 NOTE — Telephone Encounter (Signed)
Copied from [place] [phone]. Topic: Medicare AWV ?>> Feb 20, 2022  9:54 AM Cher Nakai R wrote: ?Reason for CRM:  ?Left message for patient to call back and schedule Medicare Annual Wellness Visit (AWV) in office.  ? ?If not able to come in office, please offer to do virtually or by telephone.  ? ?Last AWV: 09/02/2020 ? ?Please schedule at anytime with [facility] Health Advisor. ? ?If any questions, please contact me at [phone] ?

## 2022-02-21 DIAGNOSIS — L298 Other pruritus: Secondary | ICD-10-CM | POA: Diagnosis not present

## 2022-02-21 DIAGNOSIS — Z872 Personal history of diseases of the skin and subcutaneous tissue: Secondary | ICD-10-CM | POA: Diagnosis not present

## 2022-02-21 DIAGNOSIS — L821 Other seborrheic keratosis: Secondary | ICD-10-CM | POA: Diagnosis not present

## 2022-02-21 DIAGNOSIS — L578 Other skin changes due to chronic exposure to nonionizing radiation: Secondary | ICD-10-CM | POA: Diagnosis not present

## 2022-02-21 DIAGNOSIS — L218 Other seborrheic dermatitis: Secondary | ICD-10-CM | POA: Diagnosis not present

## 2022-02-23 DIAGNOSIS — R809 Proteinuria, unspecified: Secondary | ICD-10-CM | POA: Diagnosis not present

## 2022-02-23 DIAGNOSIS — Z794 Long term (current) use of insulin: Secondary | ICD-10-CM | POA: Diagnosis not present

## 2022-02-23 DIAGNOSIS — E1129 Type 2 diabetes mellitus with other diabetic kidney complication: Secondary | ICD-10-CM | POA: Diagnosis not present

## 2022-02-24 ENCOUNTER — Other Ambulatory Visit: Payer: Self-pay | Admitting: Family Medicine

## 2022-02-24 DIAGNOSIS — I10 Essential (primary) hypertension: Secondary | ICD-10-CM

## 2022-03-01 DIAGNOSIS — E1129 Type 2 diabetes mellitus with other diabetic kidney complication: Secondary | ICD-10-CM | POA: Diagnosis not present

## 2022-03-01 DIAGNOSIS — Z794 Long term (current) use of insulin: Secondary | ICD-10-CM | POA: Diagnosis not present

## 2022-03-01 DIAGNOSIS — E1142 Type 2 diabetes mellitus with diabetic polyneuropathy: Secondary | ICD-10-CM | POA: Diagnosis not present

## 2022-03-01 DIAGNOSIS — E11319 Type 2 diabetes mellitus with unspecified diabetic retinopathy without macular edema: Secondary | ICD-10-CM | POA: Diagnosis not present

## 2022-03-01 DIAGNOSIS — E785 Hyperlipidemia, unspecified: Secondary | ICD-10-CM | POA: Diagnosis not present

## 2022-03-01 DIAGNOSIS — R809 Proteinuria, unspecified: Secondary | ICD-10-CM | POA: Diagnosis not present

## 2022-03-01 DIAGNOSIS — E1169 Type 2 diabetes mellitus with other specified complication: Secondary | ICD-10-CM | POA: Diagnosis not present

## 2022-03-01 DIAGNOSIS — E1122 Type 2 diabetes mellitus with diabetic chronic kidney disease: Secondary | ICD-10-CM | POA: Diagnosis not present

## 2022-03-01 DIAGNOSIS — N184 Chronic kidney disease, stage 4 (severe): Secondary | ICD-10-CM | POA: Diagnosis not present

## 2022-03-16 ENCOUNTER — Other Ambulatory Visit: Payer: Self-pay | Admitting: Family Medicine

## 2022-03-16 DIAGNOSIS — I10 Essential (primary) hypertension: Secondary | ICD-10-CM

## 2022-03-17 NOTE — Telephone Encounter (Signed)
Interface surescripts requested too soon. Last refill 02/24/22. ?Requested Prescriptions  ?Refused Prescriptions Disp Refills  ?? carvedilol (COREG) 25 MG tablet [Pharmacy Med Name: CARVEDILOL 25 MG TABLET] 180 tablet 0  ?  Sig: TAKE 1 TABLET BY MOUTH TWICE A DAY WITH MEALS  ?  ? Cardiovascular: Beta Blockers 3 Failed - 03/16/2022  7:04 PM  ?  ?  Failed - Cr in normal range and within 360 days  ?  Creat  ?Date Value Ref Range Status  ?06/08/2015 1.13 (H) 0.50 - 1.10 mg/dL Final  ? ?Creatinine, Ser  ?Date Value Ref Range Status  ?09/01/2021 1.73 (H) 0.44 - 1.00 mg/dL Final  ?   ?  ?  Failed - Last BP in normal range  ?  BP Readings from Last 1 Encounters:  ?09/01/21 (!) 192/66  ?   ?  ?  Failed - Valid encounter within last 6 months  ?  Recent Outpatient Visits   ?      ? 10 months ago Cough  ? [facility] Jerrol Banana., MD  ? 11 months ago Community acquired pneumonia, unspecified laterality  ? [facility] Jerrol Banana., MD  ? 1 year ago Cough  ? [facility] Jerrol Banana., MD  ? 1 year ago Arteriosclerosis of coronary artery  ? [facility] Jerrol Banana., MD  ? 1 year ago Left hip pain  ? [place], [place]  ?  ?  ? ?  ?  ?  Passed - AST in normal range and within 360 days  ?  AST  ?Date Value Ref Range Status  ?09/01/2021 16 15 - 41 U/L Final  ?   ?  ?  Passed - ALT in normal range and within 360 days  ?  ALT  ?Date Value Ref Range Status  ?09/01/2021 19 0 - 44 U/L Final  ?   ?  ?  Passed - Last Heart Rate in normal range  ?  Pulse Readings from Last 1 Encounters:  ?09/01/21 67  ?   ?  ?  ? ?

## 2022-04-03 ENCOUNTER — Telehealth: Payer: Self-pay | Admitting: Family Medicine

## 2022-04-03 NOTE — Telephone Encounter (Signed)
Copied from [place]. Topic: Medicare AWV ?>> Apr 03, 2022 11:41 AM Cher Nakai R wrote: ?Reason for CRM:  ?Left message for patient to call back and schedule Medicare Annual Wellness Visit (AWV) in office.  ? ?If unable to come into the office for AWV,  please offer to do virtually or by telephone. ? ?Last AWV:  09/02/2020 ? ?Please schedule at anytime with [facility] Health Advisor. ? ?30 minute appointment for Virtual or phone ?45 minute appointment for in office or Initial virtual/phone ? ?Any questions, please contact me at [phone] ?

## 2022-04-11 DIAGNOSIS — E1122 Type 2 diabetes mellitus with diabetic chronic kidney disease: Secondary | ICD-10-CM | POA: Diagnosis not present

## 2022-04-11 DIAGNOSIS — N2581 Secondary hyperparathyroidism of renal origin: Secondary | ICD-10-CM | POA: Diagnosis not present

## 2022-04-11 DIAGNOSIS — I129 Hypertensive chronic kidney disease with stage 1 through stage 4 chronic kidney disease, or unspecified chronic kidney disease: Secondary | ICD-10-CM | POA: Diagnosis not present

## 2022-04-11 DIAGNOSIS — R809 Proteinuria, unspecified: Secondary | ICD-10-CM | POA: Diagnosis not present

## 2022-04-11 DIAGNOSIS — D631 Anemia in chronic kidney disease: Secondary | ICD-10-CM | POA: Diagnosis not present

## 2022-04-11 DIAGNOSIS — N184 Chronic kidney disease, stage 4 (severe): Secondary | ICD-10-CM | POA: Diagnosis not present

## 2022-04-18 DIAGNOSIS — E1122 Type 2 diabetes mellitus with diabetic chronic kidney disease: Secondary | ICD-10-CM | POA: Diagnosis not present

## 2022-04-18 DIAGNOSIS — N184 Chronic kidney disease, stage 4 (severe): Secondary | ICD-10-CM | POA: Diagnosis not present

## 2022-04-18 DIAGNOSIS — N2581 Secondary hyperparathyroidism of renal origin: Secondary | ICD-10-CM | POA: Diagnosis not present

## 2022-04-18 DIAGNOSIS — I129 Hypertensive chronic kidney disease with stage 1 through stage 4 chronic kidney disease, or unspecified chronic kidney disease: Secondary | ICD-10-CM | POA: Diagnosis not present

## 2022-04-19 DIAGNOSIS — M3501 Sicca syndrome with keratoconjunctivitis: Secondary | ICD-10-CM | POA: Diagnosis not present

## 2022-04-20 DIAGNOSIS — E782 Mixed hyperlipidemia: Secondary | ICD-10-CM | POA: Diagnosis not present

## 2022-04-20 DIAGNOSIS — E039 Hypothyroidism, unspecified: Secondary | ICD-10-CM | POA: Diagnosis not present

## 2022-04-20 DIAGNOSIS — Z794 Long term (current) use of insulin: Secondary | ICD-10-CM | POA: Diagnosis not present

## 2022-04-20 DIAGNOSIS — N1831 Chronic kidney disease, stage 3a: Secondary | ICD-10-CM | POA: Diagnosis not present

## 2022-04-20 DIAGNOSIS — E1122 Type 2 diabetes mellitus with diabetic chronic kidney disease: Secondary | ICD-10-CM | POA: Diagnosis not present

## 2022-04-20 DIAGNOSIS — Z780 Asymptomatic menopausal state: Secondary | ICD-10-CM | POA: Diagnosis not present

## 2022-04-27 DIAGNOSIS — Z23 Encounter for immunization: Secondary | ICD-10-CM | POA: Diagnosis not present

## 2022-04-27 DIAGNOSIS — E782 Mixed hyperlipidemia: Secondary | ICD-10-CM | POA: Diagnosis not present

## 2022-04-27 DIAGNOSIS — E1122 Type 2 diabetes mellitus with diabetic chronic kidney disease: Secondary | ICD-10-CM | POA: Diagnosis not present

## 2022-04-27 DIAGNOSIS — Z0000 Encounter for general adult medical examination without abnormal findings: Secondary | ICD-10-CM | POA: Diagnosis not present

## 2022-04-27 DIAGNOSIS — Z794 Long term (current) use of insulin: Secondary | ICD-10-CM | POA: Diagnosis not present

## 2022-04-27 DIAGNOSIS — G8929 Other chronic pain: Secondary | ICD-10-CM | POA: Diagnosis not present

## 2022-04-27 DIAGNOSIS — I350 Nonrheumatic aortic (valve) stenosis: Secondary | ICD-10-CM | POA: Diagnosis not present

## 2022-04-27 DIAGNOSIS — N1831 Chronic kidney disease, stage 3a: Secondary | ICD-10-CM | POA: Diagnosis not present

## 2022-04-27 DIAGNOSIS — E039 Hypothyroidism, unspecified: Secondary | ICD-10-CM | POA: Diagnosis not present

## 2022-04-27 DIAGNOSIS — M7551 Bursitis of right shoulder: Secondary | ICD-10-CM | POA: Diagnosis not present

## 2022-04-27 DIAGNOSIS — M25561 Pain in right knee: Secondary | ICD-10-CM | POA: Diagnosis not present

## 2022-05-25 DIAGNOSIS — L570 Actinic keratosis: Secondary | ICD-10-CM | POA: Diagnosis not present

## 2022-05-25 DIAGNOSIS — L298 Other pruritus: Secondary | ICD-10-CM | POA: Diagnosis not present

## 2022-05-25 DIAGNOSIS — L821 Other seborrheic keratosis: Secondary | ICD-10-CM | POA: Diagnosis not present

## 2022-06-12 DIAGNOSIS — M1711 Unilateral primary osteoarthritis, right knee: Secondary | ICD-10-CM | POA: Diagnosis not present

## 2022-06-26 ENCOUNTER — Emergency Department
Admission: EM | Admit: 2022-06-26 | Discharge: 2022-06-26 | Discharge status: Against Medical Advice | Payer: HMO | Attending: Student in an Organized Health Care Education/Training Program | Admitting: Student in an Organized Health Care Education/Training Program

## 2022-06-26 ENCOUNTER — Other Ambulatory Visit: Payer: Self-pay

## 2022-06-26 ENCOUNTER — Encounter: Payer: Self-pay | Admitting: Oncology

## 2022-06-26 DIAGNOSIS — Z5321 Procedure and treatment not carried out due to patient leaving prior to being seen by health care provider: Secondary | ICD-10-CM | POA: Insufficient documentation

## 2022-06-26 DIAGNOSIS — E162 Hypoglycemia, unspecified: Secondary | ICD-10-CM | POA: Insufficient documentation

## 2022-06-26 DIAGNOSIS — R42 Dizziness and giddiness: Secondary | ICD-10-CM | POA: Diagnosis not present

## 2022-06-26 LAB — COMPREHENSIVE METABOLIC PANEL
ALT: 15 U/L (ref 0–44)
AST: 19 U/L (ref 15–41)
Albumin: 3.6 g/dL (ref 3.5–5.0)
Alkaline Phosphatase: 84 U/L (ref 38–126)
Anion gap: 7 (ref 5–15)
BUN: 28 mg/dL — ABNORMAL HIGH (ref 8–23)
CO2: 24 mmol/L (ref 22–32)
Calcium: 9.6 mg/dL (ref 8.9–10.3)
Chloride: 108 mmol/L (ref 98–111)
Creatinine, Ser: 1.76 mg/dL — ABNORMAL HIGH (ref 0.44–1.00)
GFR, Estimated: 29 mL/min — ABNORMAL LOW (ref >60)
Glucose, Bld: 138 mg/dL — ABNORMAL HIGH (ref 70–99)
Potassium: 4.1 mmol/L (ref 3.5–5.1)
Sodium: 139 mmol/L (ref 135–145)
Total Bilirubin: 0.4 mg/dL (ref 0.3–1.2)
Total Protein: 7.1 g/dL (ref 6.5–8.1)

## 2022-06-26 LAB — CBC
HCT: 35.3 % — ABNORMAL LOW (ref 36.0–46.0)
Hemoglobin: 11 g/dL — ABNORMAL LOW (ref 12.0–15.0)
MCH: 28.1 pg (ref 26.0–34.0)
MCHC: 31.2 g/dL (ref 30.0–36.0)
MCV: 90.1 fL (ref 80.0–100.0)
Platelets: 260 10*3/uL (ref 150–400)
RBC: 3.92 MIL/uL (ref 3.87–5.11)
RDW: 12.6 % (ref 11.5–15.5)
WBC: 9 10*3/uL (ref 4.0–10.5)
nRBC: 0 % (ref 0.0–0.2)

## 2022-06-26 LAB — TROPONIN I (HIGH SENSITIVITY): Troponin I (High Sensitivity): 51 ng/L — ABNORMAL HIGH (ref <18)

## 2022-06-26 NOTE — ED Notes (Signed)
No answer when called for vitals recheck and 2nd troponin

## 2022-06-26 NOTE — ED Provider Triage Note (Signed)
Emergency Medicine Provider Triage Evaluation Note  Stacey Pierce , a 81 y.o. female  was evaluated in triage.  Pt complains of dizziness for 1 week.  No chest pain or shortness of breath.  States nothing stops the dizziness and nothing makes it worse.  Patient states her blood pressures been low.  She does currently take blood pressure medication.  Review of Systems  Positive: Dizziness Negative: Chest pain shortness of breath  Physical Exam  BP (!) 154/72 (BP Location: Left Arm)   Pulse (!) 58   Temp 98.5 F (36.9 C) (Oral)   Resp 18   Ht '5\' 5"'$  (1.651 m)   Wt 85.7 kg   SpO2 95%   BMI 31.45 kg/m  Gen:   Awake, no distress   Resp:  Normal effort  MSK:   Moves extremities without difficulty  Other:    Medical Decision Making  Medically screening exam initiated at 12:29 PM.  Appropriate orders placed.  STANLEY HELMUTH was informed that the remainder of the evaluation will be completed by another provider, this initial triage assessment does not replace that evaluation, and the importance of remaining in the ED until their evaluation is complete.  Nursing protocols initiated   Versie Starks, PA-C 06/26/22 1230

## 2022-06-26 NOTE — ED Triage Notes (Signed)
Pt in with co dizziness for a week no hx of the same.States checked bp this am and was 13'H systolic. Denies hx of the same, states they have not adjust bp meds recently.

## 2022-06-26 NOTE — ED Notes (Signed)
Called on phone. Pt reports went home. Made MD apt with PCP for tomorrow per pt report. Will return top ED for worsening symptoms.

## 2022-06-27 ENCOUNTER — Telehealth: Payer: Self-pay | Admitting: Emergency Medicine

## 2022-06-27 DIAGNOSIS — M25561 Pain in right knee: Secondary | ICD-10-CM | POA: Diagnosis not present

## 2022-06-27 DIAGNOSIS — G8929 Other chronic pain: Secondary | ICD-10-CM | POA: Diagnosis not present

## 2022-06-27 DIAGNOSIS — Z794 Long term (current) use of insulin: Secondary | ICD-10-CM | POA: Diagnosis not present

## 2022-06-27 DIAGNOSIS — E039 Hypothyroidism, unspecified: Secondary | ICD-10-CM | POA: Diagnosis not present

## 2022-06-27 DIAGNOSIS — N1831 Chronic kidney disease, stage 3a: Secondary | ICD-10-CM | POA: Diagnosis not present

## 2022-06-27 DIAGNOSIS — I951 Orthostatic hypotension: Secondary | ICD-10-CM | POA: Diagnosis not present

## 2022-06-27 DIAGNOSIS — I350 Nonrheumatic aortic (valve) stenosis: Secondary | ICD-10-CM | POA: Diagnosis not present

## 2022-06-27 DIAGNOSIS — E1122 Type 2 diabetes mellitus with diabetic chronic kidney disease: Secondary | ICD-10-CM | POA: Diagnosis not present

## 2022-06-27 NOTE — Telephone Encounter (Signed)
Called patient due to left emergency department before provider exam to inquire about condition and follow up plans. Says she is seeing pcp today at 1130a.  I told her to make sure she asks them to review the labs done here.

## 2022-06-29 DIAGNOSIS — R809 Proteinuria, unspecified: Secondary | ICD-10-CM | POA: Diagnosis not present

## 2022-06-29 DIAGNOSIS — Z794 Long term (current) use of insulin: Secondary | ICD-10-CM | POA: Diagnosis not present

## 2022-06-29 DIAGNOSIS — E1129 Type 2 diabetes mellitus with other diabetic kidney complication: Secondary | ICD-10-CM | POA: Diagnosis not present

## 2022-07-03 ENCOUNTER — Encounter: Payer: Self-pay | Admitting: Oncology

## 2022-07-04 DIAGNOSIS — E039 Hypothyroidism, unspecified: Secondary | ICD-10-CM | POA: Diagnosis not present

## 2022-07-04 DIAGNOSIS — E1122 Type 2 diabetes mellitus with diabetic chronic kidney disease: Secondary | ICD-10-CM | POA: Diagnosis not present

## 2022-07-04 DIAGNOSIS — Z794 Long term (current) use of insulin: Secondary | ICD-10-CM | POA: Diagnosis not present

## 2022-07-04 DIAGNOSIS — I350 Nonrheumatic aortic (valve) stenosis: Secondary | ICD-10-CM | POA: Diagnosis not present

## 2022-07-04 DIAGNOSIS — N1831 Chronic kidney disease, stage 3a: Secondary | ICD-10-CM | POA: Diagnosis not present

## 2022-07-04 DIAGNOSIS — I951 Orthostatic hypotension: Secondary | ICD-10-CM | POA: Diagnosis not present

## 2022-07-06 DIAGNOSIS — E785 Hyperlipidemia, unspecified: Secondary | ICD-10-CM | POA: Diagnosis not present

## 2022-07-06 DIAGNOSIS — Z794 Long term (current) use of insulin: Secondary | ICD-10-CM | POA: Diagnosis not present

## 2022-07-06 DIAGNOSIS — E11319 Type 2 diabetes mellitus with unspecified diabetic retinopathy without macular edema: Secondary | ICD-10-CM | POA: Diagnosis not present

## 2022-07-06 DIAGNOSIS — E1129 Type 2 diabetes mellitus with other diabetic kidney complication: Secondary | ICD-10-CM | POA: Diagnosis not present

## 2022-07-06 DIAGNOSIS — R809 Proteinuria, unspecified: Secondary | ICD-10-CM | POA: Diagnosis not present

## 2022-07-06 DIAGNOSIS — E1122 Type 2 diabetes mellitus with diabetic chronic kidney disease: Secondary | ICD-10-CM | POA: Diagnosis not present

## 2022-07-06 DIAGNOSIS — N184 Chronic kidney disease, stage 4 (severe): Secondary | ICD-10-CM | POA: Diagnosis not present

## 2022-07-06 DIAGNOSIS — E1169 Type 2 diabetes mellitus with other specified complication: Secondary | ICD-10-CM | POA: Diagnosis not present

## 2022-07-06 DIAGNOSIS — E1142 Type 2 diabetes mellitus with diabetic polyneuropathy: Secondary | ICD-10-CM | POA: Diagnosis not present

## 2022-07-11 DIAGNOSIS — H401131 Primary open-angle glaucoma, bilateral, mild stage: Secondary | ICD-10-CM | POA: Diagnosis not present

## 2022-07-11 LAB — HM DIABETES EYE EXAM

## 2022-08-16 DIAGNOSIS — D631 Anemia in chronic kidney disease: Secondary | ICD-10-CM | POA: Diagnosis not present

## 2022-08-16 DIAGNOSIS — E1122 Type 2 diabetes mellitus with diabetic chronic kidney disease: Secondary | ICD-10-CM | POA: Diagnosis not present

## 2022-08-16 DIAGNOSIS — N184 Chronic kidney disease, stage 4 (severe): Secondary | ICD-10-CM | POA: Diagnosis not present

## 2022-08-16 DIAGNOSIS — I129 Hypertensive chronic kidney disease with stage 1 through stage 4 chronic kidney disease, or unspecified chronic kidney disease: Secondary | ICD-10-CM | POA: Diagnosis not present

## 2022-08-16 DIAGNOSIS — N2581 Secondary hyperparathyroidism of renal origin: Secondary | ICD-10-CM | POA: Diagnosis not present

## 2022-08-16 DIAGNOSIS — R809 Proteinuria, unspecified: Secondary | ICD-10-CM | POA: Diagnosis not present

## 2022-08-23 DIAGNOSIS — E1122 Type 2 diabetes mellitus with diabetic chronic kidney disease: Secondary | ICD-10-CM | POA: Diagnosis not present

## 2022-08-23 DIAGNOSIS — D631 Anemia in chronic kidney disease: Secondary | ICD-10-CM | POA: Diagnosis not present

## 2022-08-23 DIAGNOSIS — I10 Essential (primary) hypertension: Secondary | ICD-10-CM | POA: Diagnosis not present

## 2022-08-23 DIAGNOSIS — N2581 Secondary hyperparathyroidism of renal origin: Secondary | ICD-10-CM | POA: Diagnosis not present

## 2022-08-23 DIAGNOSIS — N184 Chronic kidney disease, stage 4 (severe): Secondary | ICD-10-CM | POA: Diagnosis not present

## 2022-12-11 ENCOUNTER — Telehealth: Payer: Self-pay | Admitting: Family Medicine

## 2022-12-11 NOTE — Telephone Encounter (Signed)
Copied from [place] [id number]. Topic: General - Other >> Dec 08, 2022  2:56 PM Everette C wrote: Reason for CRM: Bonita with [employer] has called to notify the practice that per Dr. Joie Bimler the patient has chronic kidney disease and has began taking Metformin 2000 MG  Please contact further if/when needed

## 2022-12-27 DIAGNOSIS — N184 Chronic kidney disease, stage 4 (severe): Secondary | ICD-10-CM | POA: Diagnosis not present

## 2022-12-27 DIAGNOSIS — E1122 Type 2 diabetes mellitus with diabetic chronic kidney disease: Secondary | ICD-10-CM | POA: Diagnosis not present

## 2023-01-03 DIAGNOSIS — I10 Essential (primary) hypertension: Secondary | ICD-10-CM | POA: Diagnosis not present

## 2023-01-03 DIAGNOSIS — E1122 Type 2 diabetes mellitus with diabetic chronic kidney disease: Secondary | ICD-10-CM | POA: Diagnosis not present

## 2023-01-03 DIAGNOSIS — D631 Anemia in chronic kidney disease: Secondary | ICD-10-CM | POA: Diagnosis not present

## 2023-01-03 DIAGNOSIS — N2581 Secondary hyperparathyroidism of renal origin: Secondary | ICD-10-CM | POA: Diagnosis not present

## 2023-01-03 DIAGNOSIS — N184 Chronic kidney disease, stage 4 (severe): Secondary | ICD-10-CM | POA: Diagnosis not present

## 2023-01-16 DIAGNOSIS — Z794 Long term (current) use of insulin: Secondary | ICD-10-CM | POA: Diagnosis not present

## 2023-01-16 DIAGNOSIS — E039 Hypothyroidism, unspecified: Secondary | ICD-10-CM | POA: Diagnosis not present

## 2023-01-16 DIAGNOSIS — I951 Orthostatic hypotension: Secondary | ICD-10-CM | POA: Diagnosis not present

## 2023-01-16 DIAGNOSIS — N1831 Chronic kidney disease, stage 3a: Secondary | ICD-10-CM | POA: Diagnosis not present

## 2023-01-16 DIAGNOSIS — E782 Mixed hyperlipidemia: Secondary | ICD-10-CM | POA: Diagnosis not present

## 2023-01-16 DIAGNOSIS — M48062 Spinal stenosis, lumbar region with neurogenic claudication: Secondary | ICD-10-CM | POA: Diagnosis not present

## 2023-01-16 DIAGNOSIS — E1122 Type 2 diabetes mellitus with diabetic chronic kidney disease: Secondary | ICD-10-CM | POA: Diagnosis not present

## 2023-02-26 DIAGNOSIS — H401131 Primary open-angle glaucoma, bilateral, mild stage: Secondary | ICD-10-CM | POA: Diagnosis not present

## 2023-02-26 DIAGNOSIS — Z961 Presence of intraocular lens: Secondary | ICD-10-CM | POA: Diagnosis not present

## 2023-02-26 DIAGNOSIS — E113293 Type 2 diabetes mellitus with mild nonproliferative diabetic retinopathy without macular edema, bilateral: Secondary | ICD-10-CM | POA: Diagnosis not present

## 2023-03-08 DIAGNOSIS — Z794 Long term (current) use of insulin: Secondary | ICD-10-CM | POA: Diagnosis not present

## 2023-03-08 DIAGNOSIS — E1129 Type 2 diabetes mellitus with other diabetic kidney complication: Secondary | ICD-10-CM | POA: Diagnosis not present

## 2023-03-08 DIAGNOSIS — R809 Proteinuria, unspecified: Secondary | ICD-10-CM | POA: Diagnosis not present

## 2023-03-15 DIAGNOSIS — E1129 Type 2 diabetes mellitus with other diabetic kidney complication: Secondary | ICD-10-CM | POA: Diagnosis not present

## 2023-03-15 DIAGNOSIS — I10 Essential (primary) hypertension: Secondary | ICD-10-CM | POA: Diagnosis not present

## 2023-03-15 DIAGNOSIS — N184 Chronic kidney disease, stage 4 (severe): Secondary | ICD-10-CM | POA: Diagnosis not present

## 2023-03-15 DIAGNOSIS — E785 Hyperlipidemia, unspecified: Secondary | ICD-10-CM | POA: Diagnosis not present

## 2023-03-15 DIAGNOSIS — E1122 Type 2 diabetes mellitus with diabetic chronic kidney disease: Secondary | ICD-10-CM | POA: Diagnosis not present

## 2023-03-15 DIAGNOSIS — R809 Proteinuria, unspecified: Secondary | ICD-10-CM | POA: Diagnosis not present

## 2023-03-15 DIAGNOSIS — E1169 Type 2 diabetes mellitus with other specified complication: Secondary | ICD-10-CM | POA: Diagnosis not present

## 2023-03-15 DIAGNOSIS — Z794 Long term (current) use of insulin: Secondary | ICD-10-CM | POA: Diagnosis not present

## 2023-03-15 DIAGNOSIS — E11319 Type 2 diabetes mellitus with unspecified diabetic retinopathy without macular edema: Secondary | ICD-10-CM | POA: Diagnosis not present

## 2023-03-15 DIAGNOSIS — E1142 Type 2 diabetes mellitus with diabetic polyneuropathy: Secondary | ICD-10-CM | POA: Diagnosis not present

## 2023-05-01 DIAGNOSIS — N1831 Chronic kidney disease, stage 3a: Secondary | ICD-10-CM | POA: Diagnosis not present

## 2023-05-01 DIAGNOSIS — Z0000 Encounter for general adult medical examination without abnormal findings: Secondary | ICD-10-CM | POA: Diagnosis not present

## 2023-05-01 DIAGNOSIS — E1122 Type 2 diabetes mellitus with diabetic chronic kidney disease: Secondary | ICD-10-CM | POA: Diagnosis not present

## 2023-05-01 DIAGNOSIS — Z794 Long term (current) use of insulin: Secondary | ICD-10-CM | POA: Diagnosis not present

## 2023-05-01 DIAGNOSIS — E559 Vitamin D deficiency, unspecified: Secondary | ICD-10-CM | POA: Diagnosis not present

## 2023-05-09 ENCOUNTER — Other Ambulatory Visit: Payer: Self-pay | Admitting: Internal Medicine

## 2023-05-09 DIAGNOSIS — N1832 Chronic kidney disease, stage 3b: Secondary | ICD-10-CM | POA: Diagnosis not present

## 2023-05-09 DIAGNOSIS — E559 Vitamin D deficiency, unspecified: Secondary | ICD-10-CM | POA: Diagnosis not present

## 2023-05-09 DIAGNOSIS — Z1231 Encounter for screening mammogram for malignant neoplasm of breast: Secondary | ICD-10-CM

## 2023-05-09 DIAGNOSIS — E039 Hypothyroidism, unspecified: Secondary | ICD-10-CM | POA: Diagnosis not present

## 2023-05-09 DIAGNOSIS — M25561 Pain in right knee: Secondary | ICD-10-CM | POA: Diagnosis not present

## 2023-05-09 DIAGNOSIS — Z0000 Encounter for general adult medical examination without abnormal findings: Secondary | ICD-10-CM | POA: Diagnosis not present

## 2023-05-09 DIAGNOSIS — G8929 Other chronic pain: Secondary | ICD-10-CM | POA: Diagnosis not present

## 2023-05-09 DIAGNOSIS — N1831 Chronic kidney disease, stage 3a: Secondary | ICD-10-CM | POA: Diagnosis not present

## 2023-05-09 DIAGNOSIS — E1122 Type 2 diabetes mellitus with diabetic chronic kidney disease: Secondary | ICD-10-CM | POA: Diagnosis not present

## 2023-05-09 DIAGNOSIS — M48062 Spinal stenosis, lumbar region with neurogenic claudication: Secondary | ICD-10-CM | POA: Diagnosis not present

## 2023-05-09 DIAGNOSIS — Z794 Long term (current) use of insulin: Secondary | ICD-10-CM | POA: Diagnosis not present

## 2023-05-11 ENCOUNTER — Other Ambulatory Visit: Payer: Self-pay | Admitting: Internal Medicine

## 2023-05-11 DIAGNOSIS — M48062 Spinal stenosis, lumbar region with neurogenic claudication: Secondary | ICD-10-CM

## 2023-05-15 DIAGNOSIS — N184 Chronic kidney disease, stage 4 (severe): Secondary | ICD-10-CM | POA: Diagnosis not present

## 2023-05-15 DIAGNOSIS — I10 Essential (primary) hypertension: Secondary | ICD-10-CM | POA: Diagnosis not present

## 2023-05-15 DIAGNOSIS — E1122 Type 2 diabetes mellitus with diabetic chronic kidney disease: Secondary | ICD-10-CM | POA: Diagnosis not present

## 2023-05-15 DIAGNOSIS — N2581 Secondary hyperparathyroidism of renal origin: Secondary | ICD-10-CM | POA: Diagnosis not present

## 2023-05-15 DIAGNOSIS — D631 Anemia in chronic kidney disease: Secondary | ICD-10-CM | POA: Diagnosis not present

## 2023-05-18 ENCOUNTER — Ambulatory Visit
Admission: RE | Admit: 2023-05-18 | Discharge: 2023-05-18 | Disposition: A | Discharge status: Home / Self Care | Payer: PPO | Source: Ambulatory Visit | Attending: Internal Medicine | Admitting: Internal Medicine

## 2023-05-18 DIAGNOSIS — M48062 Spinal stenosis, lumbar region with neurogenic claudication: Secondary | ICD-10-CM | POA: Diagnosis not present

## 2023-05-18 DIAGNOSIS — M47816 Spondylosis without myelopathy or radiculopathy, lumbar region: Secondary | ICD-10-CM | POA: Diagnosis not present

## 2023-05-18 DIAGNOSIS — M48061 Spinal stenosis, lumbar region without neurogenic claudication: Secondary | ICD-10-CM | POA: Diagnosis not present

## 2023-05-22 DIAGNOSIS — I129 Hypertensive chronic kidney disease with stage 1 through stage 4 chronic kidney disease, or unspecified chronic kidney disease: Secondary | ICD-10-CM | POA: Diagnosis not present

## 2023-05-22 DIAGNOSIS — D631 Anemia in chronic kidney disease: Secondary | ICD-10-CM | POA: Diagnosis not present

## 2023-05-22 DIAGNOSIS — R809 Proteinuria, unspecified: Secondary | ICD-10-CM | POA: Diagnosis not present

## 2023-05-22 DIAGNOSIS — E1122 Type 2 diabetes mellitus with diabetic chronic kidney disease: Secondary | ICD-10-CM | POA: Diagnosis not present

## 2023-05-22 DIAGNOSIS — N2581 Secondary hyperparathyroidism of renal origin: Secondary | ICD-10-CM | POA: Diagnosis not present

## 2023-05-22 DIAGNOSIS — N184 Chronic kidney disease, stage 4 (severe): Secondary | ICD-10-CM | POA: Diagnosis not present

## 2023-05-24 DIAGNOSIS — M545 Low back pain, unspecified: Secondary | ICD-10-CM | POA: Diagnosis not present

## 2023-05-24 DIAGNOSIS — G8929 Other chronic pain: Secondary | ICD-10-CM | POA: Diagnosis not present

## 2023-06-04 DIAGNOSIS — M48062 Spinal stenosis, lumbar region with neurogenic claudication: Secondary | ICD-10-CM | POA: Diagnosis not present

## 2023-06-04 DIAGNOSIS — M5416 Radiculopathy, lumbar region: Secondary | ICD-10-CM | POA: Diagnosis not present

## 2023-06-04 DIAGNOSIS — M5136 Other intervertebral disc degeneration, lumbar region: Secondary | ICD-10-CM | POA: Diagnosis not present

## 2023-06-04 DIAGNOSIS — M47816 Spondylosis without myelopathy or radiculopathy, lumbar region: Secondary | ICD-10-CM | POA: Diagnosis not present

## 2023-06-18 DIAGNOSIS — E11319 Type 2 diabetes mellitus with unspecified diabetic retinopathy without macular edema: Secondary | ICD-10-CM | POA: Diagnosis not present

## 2023-06-18 DIAGNOSIS — Z794 Long term (current) use of insulin: Secondary | ICD-10-CM | POA: Diagnosis not present

## 2023-06-21 ENCOUNTER — Other Ambulatory Visit: Payer: Self-pay | Admitting: Internal Medicine

## 2023-06-21 DIAGNOSIS — N644 Mastodynia: Secondary | ICD-10-CM

## 2023-06-27 DIAGNOSIS — E11319 Type 2 diabetes mellitus with unspecified diabetic retinopathy without macular edema: Secondary | ICD-10-CM | POA: Diagnosis not present

## 2023-06-27 DIAGNOSIS — E1122 Type 2 diabetes mellitus with diabetic chronic kidney disease: Secondary | ICD-10-CM | POA: Diagnosis not present

## 2023-06-27 DIAGNOSIS — E782 Mixed hyperlipidemia: Secondary | ICD-10-CM | POA: Diagnosis not present

## 2023-06-27 DIAGNOSIS — Z794 Long term (current) use of insulin: Secondary | ICD-10-CM | POA: Diagnosis not present

## 2023-06-27 DIAGNOSIS — E1129 Type 2 diabetes mellitus with other diabetic kidney complication: Secondary | ICD-10-CM | POA: Diagnosis not present

## 2023-06-27 DIAGNOSIS — I10 Essential (primary) hypertension: Secondary | ICD-10-CM | POA: Diagnosis not present

## 2023-06-27 DIAGNOSIS — I2510 Atherosclerotic heart disease of native coronary artery without angina pectoris: Secondary | ICD-10-CM | POA: Diagnosis not present

## 2023-06-27 DIAGNOSIS — E1169 Type 2 diabetes mellitus with other specified complication: Secondary | ICD-10-CM | POA: Diagnosis not present

## 2023-06-27 DIAGNOSIS — R079 Chest pain, unspecified: Secondary | ICD-10-CM | POA: Diagnosis not present

## 2023-06-27 DIAGNOSIS — E785 Hyperlipidemia, unspecified: Secondary | ICD-10-CM | POA: Diagnosis not present

## 2023-06-27 DIAGNOSIS — Z952 Presence of prosthetic heart valve: Secondary | ICD-10-CM | POA: Diagnosis not present

## 2023-06-27 DIAGNOSIS — E1142 Type 2 diabetes mellitus with diabetic polyneuropathy: Secondary | ICD-10-CM | POA: Diagnosis not present

## 2023-06-27 DIAGNOSIS — R809 Proteinuria, unspecified: Secondary | ICD-10-CM | POA: Diagnosis not present

## 2023-06-27 DIAGNOSIS — N184 Chronic kidney disease, stage 4 (severe): Secondary | ICD-10-CM | POA: Diagnosis not present

## 2023-07-03 ENCOUNTER — Encounter: Payer: Self-pay | Admitting: Oncology

## 2023-07-04 ENCOUNTER — Ambulatory Visit
Admission: RE | Admit: 2023-07-04 | Discharge: 2023-07-04 | Disposition: A | Discharge status: Home / Self Care | Payer: PPO | Source: Ambulatory Visit | Attending: Internal Medicine | Admitting: Internal Medicine

## 2023-07-04 DIAGNOSIS — N644 Mastodynia: Secondary | ICD-10-CM | POA: Diagnosis not present

## 2023-07-04 DIAGNOSIS — R92323 Mammographic fibroglandular density, bilateral breasts: Secondary | ICD-10-CM | POA: Diagnosis not present

## 2023-07-16 ENCOUNTER — Ambulatory Visit: Payer: PPO | Attending: Family Medicine | Admitting: Physical Therapy

## 2023-07-16 DIAGNOSIS — M5459 Other low back pain: Secondary | ICD-10-CM | POA: Insufficient documentation

## 2023-07-16 NOTE — Therapy (Signed)
OUTPATIENT PHYSICAL THERAPY THORACOLUMBAR EVALUATION   Patient Name: Stacey Pierce MRN: [id number] DOB:1941-08-07, 82 y.o., female Today's Date: 07/16/2023  END OF SESSION:  PT End of Session - 07/16/23 1436     Visit Number 1    Number of Visits 20    Date for PT Re-Evaluation 09/24/23    Authorization Type HTA [id number]    Authorization - Visit Number 1    Authorization - Number of Visits 20    Progress Note Due on Visit 10    PT Start Time 1345    PT Stop Time 1430    PT Time Calculation (min) 45 min    Activity Tolerance Patient limited by pain    Behavior During Therapy [facility] for tasks assessed/performed             Past Medical History:  Diagnosis Date   Anemia    Aortic stenosis, severe    a. 09/2017: s/p TAVR   CKD (chronic kidney disease)    Diabetes mellitus, insulin dependent (IDDM), uncontrolled    DR (diabetic retinopathy) (HCC)    GERD (gastroesophageal reflux disease)    Glaucoma    Hyperlipidemia    Hypertension    Hypothyroidism    Obesity    S/P TAVR (transcatheter aortic valve replacement) 09/04/2017   26 mm Medtronic Evolut Pro transcatheter heart valve placed via percutaneous right transfemoral approach    Past Surgical History:  Procedure Laterality Date   CATARACT EXTRACTION     left 2010   CATARACT EXTRACTION W/PHACO Right 10/13/2015   Procedure: CATARACT EXTRACTION PHACO AND INTRAOCULAR LENS PLACEMENT (IOC);  Surgeon: Lockie Mola, MD;  Location: [facility];  Service: Ophthalmology;  Laterality: Right;  DIABETIC - insulin and oral meds MALYUGIN SHUGARCAINE   CHOLECYSTECTOMY     1967   COLONOSCOPY WITH PROPOFOL N/A 05/16/2018   Procedure: COLONOSCOPY WITH PROPOFOL;  Surgeon: Toney Reil, MD;  Location: [facility] ENDOSCOPY;  Service: Gastroenterology;  Laterality: N/A;   DILATION AND CURETTAGE OF UTERUS     ESOPHAGOGASTRODUODENOSCOPY (EGD) WITH PROPOFOL N/A 05/16/2018   Procedure: ESOPHAGOGASTRODUODENOSCOPY (EGD) WITH  PROPOFOL;  Surgeon: Toney Reil, MD;  Location: [facility] ENDOSCOPY;  Service: Gastroenterology;  Laterality: N/A;   EYE SURGERY     LAPAROSCOPIC TUBAL LIGATION     RIGHT/LEFT HEART CATH AND CORONARY ANGIOGRAPHY N/A 08/02/2017   Procedure: RIGHT/LEFT HEART CATH AND CORONARY ANGIOGRAPHY;  Surgeon: Lamar Blinks, MD;  Location: [facility] INVASIVE CV LAB;  Service: Cardiovascular;  Laterality: N/A;   TEE WITHOUT CARDIOVERSION N/A 09/04/2017   Procedure: TRANSESOPHAGEAL ECHOCARDIOGRAM (TEE);  Surgeon: Tonny Bollman, MD;  Location: [facility] OR;  Service: Open Heart Surgery;  Laterality: N/A;   TONSILLECTOMY     1974   TRANSCATHETER AORTIC VALVE REPLACEMENT, TRANSFEMORAL N/A 09/04/2017   Procedure: TRANSCATHETER AORTIC VALVE REPLACEMENT, TRANSFEMORAL;  Surgeon: Tonny Bollman, MD;  Location: [facility] OR;  Service: Open Heart Surgery;  Laterality: N/A;   TUBAL LIGATION     Patient Active Problem List   Diagnosis Date Noted   Venous insufficiency of both lower extremities 06/13/2018   Iron deficiency anemia due to chronic blood loss 04/10/2018   B12 deficiency anemia 03/13/2018   Dizziness 01/10/2018   Obesity    S/P TAVR (transcatheter aortic valve replacement) 09/04/2017   Aortic stenosis, severe 07/30/2017   Allergic rhinitis 08/26/2015   Airway hyperreactivity 08/26/2015   Back pain, chronic 08/26/2015   Diabetes (HCC) 08/26/2015   Degeneration of lumbar or lumbosacral intervertebral disc 08/26/2015  Acid reflux 08/26/2015   Glaucoma 08/26/2015   HLD (hyperlipidemia) 08/26/2015   Adiposity 08/26/2015   Benign essential HTN 04/29/2015   Arteriosclerosis of coronary artery 01/26/2015   Retinopathy 11/19/2014   Chronic kidney disease (CKD), stage III (moderate) (HCC) 11/19/2014   Long-term insulin use (HCC) 11/19/2014   Adult hypothyroidism 02/17/2014    PCP: Dr. Enid Baas   REFERRING PROVIDER: Dr. Burman Freestone   REFERRING DIAG: M54.16 (ICD-10-CM) - Radiculopathy, lumbar  region  Rationale for Evaluation and Treatment: Rehabilitation  THERAPY DIAG:  Other low back pain  ONSET DATE: 07/15/2020   SUBJECTIVE:                                                                                                                                                                                           SUBJECTIVE STATEMENT: See pertinent history   PERTINENT HISTORY:  Pt reports that she has already had several courses of shots. She was about to have her nerve ablation to relieve pain, but her physician wants to see how pt would do with physical therapy first. Pt has difficulty standing for prolonged periods of time due to pain and this has been happening for the past three years. She has pmh that include T2DM, cardiac disease, and stage III kidney failure.   PAIN:  Are you having pain? Yes: NPRS scale: 10/10 Pain location: Bilateral hips around greater trochanter  Pain description: Just plain hurts  Aggravating factors: Standing and walking  Relieving factors: Sitting down   PRECAUTIONS: None  RED FLAGS: None   WEIGHT BEARING RESTRICTIONS: No  FALLS:  Has patient fallen in last 6 months? No  LIVING ENVIRONMENT: Lives with: lives with their spouse Lives in: House/apartment Stairs: Yes: External: 3 steps; on right going up and on left going up Has following equipment at home: None  OCCUPATION: Retired   PLOF: Independent  PATIENT GOALS: Pt was ordered to try physical therapy before the nerve ablation   NEXT MD VISIT: Sept 1st 2024   OBJECTIVE:   VITALS: BP 153/46 HR 66 SpO2 97%  DIAGNOSTIC FINDINGS:  M54.16 (ICD-10-CM) - Radiculopathy, lumbar region   CLINICAL DATA:  Back and left leg pain for 3 weeks.   EXAM: MRI LUMBAR SPINE WITHOUT CONTRAST   TECHNIQUE: Multiplanar, multisequence MR imaging of the lumbar spine was performed. No intravenous contrast was administered.   COMPARISON:  11/06/2020   FINDINGS: Segmentation: There are  five lumbar type vertebral bodies. The last full intervertebral disc space is labeled L5-S1. This correlates with the prior study.   Alignment:  Normal overall alignment.   Vertebrae: Normal marrow signal. No bone  lesions or fractures. Moderate endplate reactive changes around L3-4 with reactive Schmorl's node.   Conus medullaris and cauda equina: Conus extends to the L1 level. Conus and cauda equina appear normal.   Paraspinal and other soft tissues: No significant paraspinal retroperitoneal findings.   Disc levels:   T12-L1: No significant findings.   L1-2: Mild degenerative disc disease and facet disease but no disc protrusions, spinal or foraminal stenosis.   L2-3: Advanced degenerative disc disease with disc space narrowing, osteophytic spurring and bulging annulus. This in combination with moderate facet disease contributes to early spinal stenosis and moderate bilateral lateral recess stenosis. No foraminal stenosis. These findings appear relatively stable.   L3-4: Degenerative disc disease and facet disease with bulging annulus and ligamentum flavum thickening contributing to moderately severe spinal and bilateral lateral recess stenosis and mild bilateral foraminal stenosis. No significant change.   L4-5: Advanced degenerative disc disease, diffuse bulging annulus, central disc protrusion, short pedicles advanced facet disease and ligamentum flavum thickening. These factors contribute to severe spinal and bilateral lateral recess stenosis and mild to moderate bilateral foraminal stenosis. Stable to slightly progressive when compared to the prior study.   L5-S1: Advanced disc disease and facet disease but no significant spinal stenosis. Mild bilateral foraminal stenosis. Unchanged since prior study.   IMPRESSION: 1. Advanced degenerative lumbar spondylosis with multilevel disc disease and facet disease. Findings are relatively stable when compared to the prior  examination. 2. Multilevel multifactorial spinal, lateral recess and foraminal stenosis as discussed above at the individual levels. The most significant level is L4-5 where there is severe spinal and bilateral lateral recess stenosis and mild to moderate bilateral foraminal stenosis. 3. Moderately severe spinal and bilateral lateral recess stenosis and mild bilateral foraminal stenosis at L3-4. 4. Mild bilateral foraminal stenosis at L5-S1.     Electronically Signed   By: Rudie Meyer M.D.   On: 05/27/2023 07:04   PATIENT SURVEYS:  FOTO 38/100 with target of 49   SCREENING FOR RED FLAGS: Bowel or bladder incontinence: No Spinal tumors: No Cauda equina syndrome: No Compression fracture: No Abdominal aneurysm: No  COGNITION: Overall cognitive status: Within functional limits for tasks assessed     SENSATION: WFL  MUSCLE LENGTH: Hamstrings: Right 70 deg; Left 70  deg Thomas test: Positive Bilateral   POSTURE: rounded shoulders  PALPATION: TTP over bilateral greater trochanters   LUMBAR ROM:   AROM eval  Flexion 75%*  Extension 50%*  Right lateral flexion 50%*  Left lateral flexion 50%*  Right rotation 50%*  Left rotation 50%*   (Blank rows = not tested)  LOWER EXTREMITY ROM:     Active  Right eval Left eval  Hip flexion    Hip extension    Hip abduction    Hip adduction    Hip internal rotation    Hip external rotation    Knee flexion    Knee extension    Ankle dorsiflexion    Ankle plantarflexion    Ankle inversion    Ankle eversion     (Blank rows = not tested)  LOWER EXTREMITY MMT:    MMT Right eval Left eval  Hip flexion 4- 4-  Hip extension NT  NT   Hip abduction 4- 4-  Hip adduction    Hip internal rotation    Hip external rotation    Knee flexion 4 4  Knee extension 4 4  Ankle dorsiflexion 4 4  Ankle plantarflexion    Ankle inversion    Ankle  eversion     (Blank rows = not tested)  LUMBAR SPECIAL TESTS:  Straight leg  raise test: Negative, FABER test: Positive , and FADIR Positive   FUNCTIONAL TESTS:  Sit to stand: Needs BUE support ot perform   GAIT: Distance walked: 50 ft  Assistive device utilized: None Level of assistance: Complete Independence Comments: Forward trunk lean   TODAY'S TREATMENT:                                                                                                                              DATE:   07/16/23:  Lower Trunk Rotation 2 x 10 with 3 sec hold on each side  Seated HS Stretch 2 x 30 sec  -min VC to keep knee straight  Sit to Stand with BUE support  1 x 5   PATIENT EDUCATION:  Education details: form and technique for correct performance of exercise  Person educated: Patient Education method: Explanation, Demonstration, Verbal cues, and Handouts Education comprehension: verbalized understanding, returned demonstration, verbal cues required, and tactile cues required  HOME EXERCISE PROGRAM: Access Code: [email or web address] URL: [email or web address] Date: 07/16/2023 Prepared by: Ellin Goodie  Exercises - Sit to Stand with Counter Support  - 3-4 x weekly - 3 sets - 5 reps - Supine Lower Trunk Rotation  - 1 x daily - 2 sets - 10 reps - 3 sec  hold - Seated Hamstring Stretch  - 1 x daily - 3 reps - 30 sec  hold  ASSESSMENT:  CLINICAL IMPRESSION: Patient is an 82 y.o. white woman who was seen today for physical therapy evaluation and treatment for chronic low back pain in the setting of lumbar stenosis and lumbar spondylosis. She shows signs and symptoms that make her most appropriate for the movement control treatment based classification group with moderate pain and disability. She shows decreased hip strength and lumber ROM along with increased lumbar pain that is limiting her ability to perform ADLs like sweeping floor and loading dish washer and participating in recreational activities like bowling. She will benefit from skilled PT to address these  aforementioned deficits to return to performing ADLs and improve her ability to negotiate her home environment for improved quality of life.   OBJECTIVE IMPAIRMENTS: decreased endurance, decreased mobility, difficulty walking, decreased ROM, decreased strength, impaired flexibility, obesity, and pain.   ACTIVITY LIMITATIONS: carrying, lifting, bending, standing, squatting, stairs, transfers, and dressing  PARTICIPATION LIMITATIONS: cleaning, laundry, shopping, community activity, and yard work  PERSONAL FACTORS: Age, Time since onset of injury/illness/exacerbation, and 3+ comorbidities: HTN, T2DM, and CKD Stage III  are also affecting patient's functional outcome.   REHAB POTENTIAL: Fair chronicity of low back pain and multiple comordbities   CLINICAL DECISION MAKING: Stable/uncomplicated  EVALUATION COMPLEXITY: Low   GOALS: Goals reviewed with patient? No  SHORT TERM GOALS: Target date: 07/30/2023  Pt will be independent with HEP in order to improve strength and balance in order to decrease fall risk and  improve function at home and work. Baseline: NT  Goal status: INITIAL   LONG TERM GOALS: Target date: 09/24/2023  Patient will have improved function and activity level as evidenced by an increase in FOTO score by 10 points or more.  Baseline: 38 Goal status: INITIAL  2.  Pt will increase by at least 17m (151ft) in order to demonstrate clinically significant improvement in cardiopulmonary endurance and community ambulation Baseline: NT  Goal status: INITIAL  3.  Patient will be able to negotiate >=4 stairs with UE support and two feet per step as evidence of improved lumbar and hip function with improved activity tolerance and mobility.  Baseline: NT  Goal status: INITIAL  4.  Patient will be able to perform a sit to stand with no UE support as evidence of improved LE strength and lumbar function.  Baseline: Needs to use UE support  Goal status:  INITIAL   PLAN:  PT FREQUENCY: 1-2x/week  PT DURATION: 10 weeks  PLANNED INTERVENTIONS: Therapeutic exercises, Neuromuscular re-education, Balance training, Gait training, Patient/Family education, Self Care, Joint mobilization, Joint manipulation, Stair training, DME instructions, Aquatic Therapy, Dry Needling, Electrical stimulation, Spinal manipulation, Spinal mobilization, Cryotherapy, Moist heat, Traction, Ultrasound, Manual therapy, and Re-evaluation.  PLAN FOR NEXT SESSION: , Progress hip and abdominal strengthening exercises, assess stair mobility   Ellin Goodie PT, DPT  07/16/2023, 2:38 PM

## 2023-07-17 DIAGNOSIS — Z952 Presence of prosthetic heart valve: Secondary | ICD-10-CM | POA: Diagnosis not present

## 2023-07-17 DIAGNOSIS — R0789 Other chest pain: Secondary | ICD-10-CM | POA: Diagnosis not present

## 2023-07-17 DIAGNOSIS — I2510 Atherosclerotic heart disease of native coronary artery without angina pectoris: Secondary | ICD-10-CM | POA: Diagnosis not present

## 2023-07-18 DIAGNOSIS — E1122 Type 2 diabetes mellitus with diabetic chronic kidney disease: Secondary | ICD-10-CM | POA: Diagnosis not present

## 2023-07-18 DIAGNOSIS — N1831 Chronic kidney disease, stage 3a: Secondary | ICD-10-CM | POA: Diagnosis not present

## 2023-07-18 DIAGNOSIS — Z780 Asymptomatic menopausal state: Secondary | ICD-10-CM | POA: Diagnosis not present

## 2023-07-18 DIAGNOSIS — Z794 Long term (current) use of insulin: Secondary | ICD-10-CM | POA: Diagnosis not present

## 2023-07-18 DIAGNOSIS — M545 Low back pain, unspecified: Secondary | ICD-10-CM | POA: Diagnosis not present

## 2023-07-18 DIAGNOSIS — M4800 Spinal stenosis, site unspecified: Secondary | ICD-10-CM | POA: Diagnosis not present

## 2023-07-18 DIAGNOSIS — G8929 Other chronic pain: Secondary | ICD-10-CM | POA: Diagnosis not present

## 2023-07-18 DIAGNOSIS — E039 Hypothyroidism, unspecified: Secondary | ICD-10-CM | POA: Diagnosis not present

## 2023-07-18 DIAGNOSIS — E559 Vitamin D deficiency, unspecified: Secondary | ICD-10-CM | POA: Diagnosis not present

## 2023-07-19 ENCOUNTER — Ambulatory Visit: Payer: PPO | Admitting: Dermatology

## 2023-07-19 ENCOUNTER — Encounter: Payer: Self-pay | Admitting: Dermatology

## 2023-07-19 VITALS — BP 155/69 | HR 75

## 2023-07-19 DIAGNOSIS — L820 Inflamed seborrheic keratosis: Secondary | ICD-10-CM | POA: Diagnosis not present

## 2023-07-19 DIAGNOSIS — L821 Other seborrheic keratosis: Secondary | ICD-10-CM | POA: Diagnosis not present

## 2023-07-19 DIAGNOSIS — W908XXA Exposure to other nonionizing radiation, initial encounter: Secondary | ICD-10-CM | POA: Diagnosis not present

## 2023-07-19 DIAGNOSIS — L570 Actinic keratosis: Secondary | ICD-10-CM

## 2023-07-19 DIAGNOSIS — L814 Other melanin hyperpigmentation: Secondary | ICD-10-CM | POA: Diagnosis not present

## 2023-07-19 NOTE — Progress Notes (Signed)
New Patient Visit   Subjective  Stacey Pierce is a 82 y.o. female who presents for the following: Spots.Face, chest, arms, right thigh. Raised, rough. Patient scratches at some of these areas.   The patient has spots, moles and lesions to be evaluated, some may be new or changing and the patient may have concern these could be cancer.    The following portions of the chart were reviewed this encounter and updated as appropriate: medications, allergies, medical history  Review of Systems:  No other skin or systemic complaints except as noted in HPI or Assessment and Plan.  Objective  Well appearing patient in no apparent distress; mood and affect are within normal limits.  A focused examination was performed of the following areas: Face, torso, arms, hands, right thigh   Relevant physical exam findings are noted in the Assessment and Plan.  Right Nasal Sidewall x1, L lateral forehead x1, R eyebrow x1 (3) Erythematous thin papules/macules with gritty scale.   Right Lateral Thigh x1, back x3, chest x4 (8) Erythematous keratotic or waxy stuck-on papule or plaque.    Assessment & Plan   AK (actinic keratosis) (3) Right Nasal Sidewall x1, L lateral forehead x1, R eyebrow x1  Actinic keratoses are precancerous spots that appear secondary to cumulative UV radiation exposure/sun exposure over time. They are chronic with expected duration over 1 year. A portion of actinic keratoses will progress to squamous cell carcinoma of the skin. It is not possible to reliably predict which spots will progress to skin cancer and so treatment is recommended to prevent development of skin cancer.  Recommend daily broad spectrum sunscreen SPF 30+ to sun-exposed areas, reapply every 2 hours as needed.  Recommend staying in the shade or wearing long sleeves, sun glasses (UVA+UVB protection) and wide brim hats (4-inch brim around the entire circumference of the hat). Call for new or changing  lesions.  Destruction of lesion - Right Nasal Sidewall x1, L lateral forehead x1, R eyebrow x1 (3)  Destruction method: cryotherapy   Informed consent: discussed and consent obtained   Lesion destroyed using liquid nitrogen: Yes   Region frozen until ice ball extended beyond lesion: Yes   Outcome: patient tolerated procedure well with no complications   Post-procedure details: wound care instructions given   Additional details:  Prior to procedure, discussed risks of blister formation, small wound, skin dyspigmentation, or rare scar following cryotherapy. Recommend Vaseline ointment to treated areas while healing.   Inflamed seborrheic keratosis (8) Right Lateral Thigh x1, back x3, chest x4  Symptomatic, irritating, patient would like treated.  Destruction of lesion - Right Lateral Thigh x1, back x3, chest x4 (8)  Destruction method: cryotherapy   Informed consent: discussed and consent obtained   Lesion destroyed using liquid nitrogen: Yes   Region frozen until ice ball extended beyond lesion: Yes   Outcome: patient tolerated procedure well with no complications   Post-procedure details: wound care instructions given   Additional details:  Prior to procedure, discussed risks of blister formation, small wound, skin dyspigmentation, or rare scar following cryotherapy. Recommend Vaseline ointment to treated areas while healing.   SEBORRHEIC KERATOSIS - Stuck-on, waxy, tan-brown papules and/or plaques  - Benign-appearing - Discussed benign etiology and prognosis. - Observe - Call for any changes  LENTIGINES Exam: scattered tan macules Due to sun exposure Treatment Plan: Benign-appearing, observe. Recommend daily broad spectrum sunscreen SPF 30+ to sun-exposed areas, reapply every 2 hours as needed.  Call for any changes  Return in about 6 months (around 01/19/2024) for AK Follow Up, TBSE.  I, Lawson Radar, CMA, am acting as scribe for Elie Goody,  MD.   Documentation: I have reviewed the above documentation for accuracy and completeness, and I agree with the above.  Elie Goody, MD

## 2023-07-19 NOTE — Patient Instructions (Signed)
Cryotherapy Aftercare  Wash gently with soap and water everyday.   Apply Vaseline Jelly daily until healed.    Recommend daily broad spectrum sunscreen SPF 30+ to sun-exposed areas, reapply every 2 hours as needed. Call for new or changing lesions.  Staying in the shade or wearing long sleeves, sun glasses (UVA+UVB protection) and wide brim hats (4-inch brim around the entire circumference of the hat) are also recommended for sun protection.    Seborrheic Keratosis  What causes seborrheic keratoses? Seborrheic keratoses are harmless, common skin growths that first appear during adult life.  As time goes by, more growths appear.  Some people may develop a large number of them.  Seborrheic keratoses appear on both covered and uncovered body parts.  They are not caused by sunlight.  The tendency to develop seborrheic keratoses can be inherited.  They vary in color from skin-colored to gray, brown, or even black.  They can be either smooth or have a rough, warty surface.   Seborrheic keratoses are superficial and look as if they were stuck on the skin.  Under the microscope this type of keratosis looks like layers upon layers of skin.  That is why at times the top layer may seem to fall off, but the rest of the growth remains and re-grows.    Treatment Seborrheic keratoses do not need to be treated, but can easily be removed in the office.  Seborrheic keratoses often cause symptoms when they rub on clothing or jewelry.  Lesions can be in the way of shaving.  If they become inflamed, they can cause itching, soreness, or burning.  Removal of a seborrheic keratosis can be accomplished by freezing, burning, or surgery. If any spot bleeds, scabs, or grows rapidly, please return to have it checked, as these can be an indication of a skin cancer.  Due to recent changes in healthcare laws, you may see results of your pathology and/or laboratory studies on MyChart before the doctors have had a chance to  review them. We understand that in some cases there may be results that are confusing or concerning to you. Please understand that not all results are received at the same time and often the doctors may need to interpret multiple results in order to provide you with the best plan of care or course of treatment. Therefore, we ask that you please give [place] 2 business days to thoroughly review all your results before contacting the office for clarification. Should we see a critical lab result, you will be contacted sooner.   If You Need Anything After Your Visit  If you have any questions or concerns for your doctor, please call our main line at [phone] and press option 4 to reach your doctor's medical assistant. If no one answers, please leave a voicemail as directed and we will return your call as soon as possible. Messages left after 4 pm will be answered the following business day.   You may also send [place] a message via MyChart. We typically respond to MyChart messages within 1-2 business days.  For prescription refills, please ask your pharmacy to contact our office. Our fax number is [phone].  If you have an urgent issue when the clinic is closed that cannot wait until the next business day, you can page your doctor at the number below.    Please note that while we do our best to be available for urgent issues outside of office hours, we are not available 24/7.   If  you have an urgent issue and are unable to reach [place], you may choose to seek medical care at your doctor's office, retail clinic, urgent care center, or emergency room.  If you have a medical emergency, please immediately call 911 or go to the emergency department.  Pager Numbers  - Dr. Gwen Pounds: [phone]  - Dr. Roseanne Reno: [phone]  - Dr. Katrinka Blazing: [phone]   In the event of inclement weather, please call our main line at [phone] for an update on the status of any delays or closures.  Dermatology  Medication Tips: Please keep the boxes that topical medications come in in order to help keep track of the instructions about where and how to use these. Pharmacies typically print the medication instructions only on the boxes and not directly on the medication tubes.   If your medication is too expensive, please contact our office at [phone] option 4 or send [place] a message through MyChart.   We are unable to tell what your co-pay for medications will be in advance as this is different depending on your insurance coverage. However, we may be able to find a substitute medication at lower cost or fill out paperwork to get insurance to cover a needed medication.   If a prior authorization is required to get your medication covered by your insurance company, please allow [place] 1-2 business days to complete this process.  Drug prices often vary depending on where the prescription is filled and some pharmacies may offer cheaper prices.  The website www.goodrx.com contains coupons for medications through different pharmacies. The prices here do not account for what the cost may be with help from insurance (it may be cheaper with your insurance), but the website can give you the price if you did not use any insurance.  - You can print the associated coupon and take it with your prescription to the pharmacy.  - You may also stop by our office during regular business hours and pick up a GoodRx coupon card.  - If you need your prescription sent electronically to a different pharmacy, notify our office through [facility] or by phone at [phone] option 4.     Si Usted Necesita Algo Despus de Su Visita  Tambin puede enviarnos un mensaje a travs de Clinical cytogeneticist. Por lo general respondemos a los mensajes de MyChart en el transcurso de 1 a 2 das hbiles.  Para renovar recetas, por favor pida a su farmacia que se ponga en contacto con nuestra oficina. Annie Sable de fax es [place] [phone].  Si  tiene un asunto urgente cuando la clnica est cerrada y que no puede esperar hasta el siguiente da hbil, puede llamar/localizar a su doctor(a) al nmero que aparece a continuacin.   Por favor, tenga en cuenta que aunque hacemos todo lo posible para estar disponibles para asuntos urgentes fuera del horario de [place], no estamos disponibles las 24 horas del da, los 7 [address]  [address] de la [place].   Si tiene un problema urgente y no puede comunicarse con nosotros, puede optar por buscar atencin mdica  en el consultorio de su doctor(a), en una clnica privada, en un centro de atencin urgente o en una sala de emergencias.  Si tiene Engineer, drilling, por favor llame inmediatamente al 911 o vaya a la sala de emergencias.  Nmeros de bper  - Dr. Gwen Pounds: [phone]  - Dra. Roseanne Reno: [phone]  - Dr. Katrinka Blazing: [phone]   En caso de inclemencias del tiempo, por favor llame a nuestra lnea  principal al [phone] para una actualizacin sobre el [place] de cualquier retraso o cierre.  Consejos para la medicacin en dermatologa: Por favor, guarde las cajas en las que vienen los medicamentos de uso tpico para ayudarle a seguir las instrucciones sobre dnde y cmo usarlos. Las farmacias generalmente imprimen las instrucciones del medicamento slo en las cajas y no directamente en los tubos del [place].   Si su medicamento es muy caro, por favor, pngase en contacto con Rolm [place]ala llamando al [phone] y presione la opcin 4 o envenos un mensaje a travs de Clinical cytogeneticist.   No podemos decirle cul ser su copago por los medicamentos por adelantado ya que esto es diferente dependiendo de la cobertura de su seguro. Sin embargo, es posible que podamos encontrar un medicamento sustituto a Audiological scientist un formulario para que el seguro cubra el medicamento que se considera necesario.   Si se requiere una autorizacin previa para que su compaa de seguros [place] su medicamento, por  favor permtanos de 1 a 2 das hbiles para completar [address]  Los precios de los medicamentos varan con frecuencia dependiendo del Environmental consultant de dnde se surte la receta y alguna farmacias pueden ofrecer precios ms baratos.  El sitio web www.goodrx.com tiene cupones para medicamentos de Health and safety inspector. Los precios aqu no tienen en cuenta lo que podra costar con la ayuda del seguro (puede ser ms barato con su seguro), pero el sitio web puede darle el precio si no utiliz Tourist information centre manager.  - Puede imprimir el cupn correspondiente y llevarlo con su receta a la farmacia.  - Tambin puede pasar por nuestra oficina durante el horario de atencin regular y Education officer, museum una tarjeta de cupones de GoodRx.  - Si necesita que su receta se enve electrnicamente a una farmacia diferente, informe a nuestra oficina a travs de MyChart de [place] o por telfono llamando al [phone] y presione la opcin 4.

## 2023-07-23 ENCOUNTER — Ambulatory Visit: Payer: PPO

## 2023-07-23 DIAGNOSIS — M5459 Other low back pain: Secondary | ICD-10-CM

## 2023-07-23 NOTE — Therapy (Signed)
OUTPATIENT PHYSICAL THERAPY TREATMENT  Patient Name: Stacey Pierce MRN: [id number] DOB:1941-09-29, 82 y.o., female Today's Date: 07/23/2023  END OF SESSION:  PT End of Session - 07/23/23 1352     Visit Number 2    Number of Visits 20    Date for PT Re-Evaluation 09/24/23    Authorization Type HTA [id number]    Progress Note Due on Visit 10    PT Start Time 1345    PT Stop Time 1425    PT Time Calculation (min) 40 min    Activity Tolerance Patient limited by pain    Behavior During Therapy [facility] for tasks assessed/performed             Past Medical History:  Diagnosis Date   Anemia    Aortic stenosis, severe    a. 09/2017: s/p TAVR   CKD (chronic kidney disease)    Diabetes mellitus, insulin dependent (IDDM), uncontrolled    DR (diabetic retinopathy) (HCC)    GERD (gastroesophageal reflux disease)    Glaucoma    Hyperlipidemia    Hypertension    Hypothyroidism    Obesity    S/P TAVR (transcatheter aortic valve replacement) 09/04/2017   26 mm Medtronic Evolut Pro transcatheter heart valve placed via percutaneous right transfemoral approach    Past Surgical History:  Procedure Laterality Date   CATARACT EXTRACTION     left 2010   CATARACT EXTRACTION W/PHACO Right 10/13/2015   Procedure: CATARACT EXTRACTION PHACO AND INTRAOCULAR LENS PLACEMENT (IOC);  Surgeon: Lockie Mola, MD;  Location[facility];  Service: Ophthalmology;  Laterality: Right;  DIABETIC - insulin and oral meds MALYUGIN SHUGARCAINE   CHOLECYSTECTOMY     1967   COLONOSCOPY WITH PROPOFOL N/A 05/16/2018   Procedure: COLONOSCOPY WITH PROPOFOL;  Surgeon: Toney Reil, MD;  Location: [facility] END[facility]COPY;  Service: Gastroenterology;  Laterality: N/A;   DILATION AND CURETTAGE OF UTERUS     ESOPHAGOGASTRODUODENOSCOPY (EGD) WITH PROPOFOL N/A 05/16/2018   Procedure: ESOPHAGOGASTRODUODENOSCOPY (EGD) WITH PROPOFOL;  Surgeon: Toney Reil, MD;  Location: [facility] ENDOSCOPY;  Service: Gastroenterology;   Laterality: N/A;   EYE SURGERY     LAPAROSCOPIC TUBAL LIGATION     RIGHT/LEFT HEART CATH AND CORONARY ANGIOGRAPHY N/A 08/02/2017   Procedure: RIGHT/LEFT HEART CATH AND CORONARY ANGIOGRAPHY;  Surgeon: Lamar Blinks, MD;  Location: [facility] INVASIVE CV LAB;  Service: Cardiovascular;  Laterality: N/A;   TEE WITHOUT CARDIOVERSION N/A 09/04/2017   Procedure: TRANSESOPHAGEAL ECHOCARDIOGRAM (TEE);  Surgeon: Tonny Bollman, MD;  Location: [facility] OR;  Service: Open Heart Surgery;  Laterality: N/A;   TONSILLECTOMY     1974   TRANSCATHETER AORTIC VALVE REPLACEMENT, TRANSFEMORAL N/A 09/04/2017   Procedure: TRANSCATHETER AORTIC VALVE REPLACEMENT, TRANSFEMORAL;  Surgeon: Tonny Bollman, MD;  Location: [facility] OR;  Service: Open Heart Surgery;  Laterality: N/A;   TUBAL LIGATION     Patient Active Problem List   Diagnosis Date Noted   Venous insufficiency of both lower extremities 06/13/2018   Iron deficiency anemia due to chronic blood loss 04/10/2018   B12 deficiency anemia 03/13/2018   Dizziness 01/10/2018   Obesity    S/P TAVR (transcatheter aortic valve replacement) 09/04/2017   Aortic stenosis, severe 07/30/2017   Allergic rhinitis 08/26/2015   Airway hyperreactivity 08/26/2015   Back pain, chronic 08/26/2015   Diabetes (HCC) 08/26/2015   Degeneration of lumbar or lumbosacral intervertebral disc 08/26/2015   Acid reflux 08/26/2015   Glaucoma 08/26/2015   HLD (hyperlipidemia) 08/26/2015   Adiposity 08/26/2015  Benign essential HTN 04/29/2015   Arteriosclerosis of coronary artery 01/26/2015   Retinopathy 11/19/2014   Chronic kidney disease (CKD), stage III (moderate) (HCC) 11/19/2014   Long-term insulin use (HCC) 11/19/2014   Adult hypothyroidism 02/17/2014    PCP: Dr. Enid Baas   REFERRING PROVIDER: Dr. Burman Freestone   REFERRING DIAG: M54.16 (ICD-10-CM) - Radiculopathy, lumbar region  Rationale for Evaluation and Treatment: Rehabilitation  THERAPY DIAG:  Other low back  pain  ONSET DATE: 07/15/2020   SUBJECTIVE:                                                                                                                                                                                           SUBJECTIVE STATEMENT: Saw MD 3 times last week, including cardiac stress test (result not given yet). Was unable to find a logistical way to complete STS from chair.    PERTINENT HISTORY:  Pt reports that she has already had several courses of shots. She was about to have her nerve ablation to relieve pain, but her physician wants to see how pt would do with physical therapy first. Pt has difficulty standing for prolonged periods of time due to pain and this has been happening for the past three years. She has pmh that include T2DM, cardiac disease, and stage III kidney failure.   PAIN:  Are you having pain? Yes 10/10 , bilat ischial tuberosities with referral into back of left leg.   PRECAUTIONS: None  RED FLAGS: None   WEIGHT BEARING RESTRICTIONS: No  FALLS:  Has patient fallen in last 6 months? No  PATIENT GOALS: Pt was ordered to try physical therapy before the nerve ablation   OBJECTIVE:    TODAY'S TREATMENT:                                                                                                                              DATE:   07/23/23: *HEP review -hamstrings stretch 2x30sec bilat (asked to stick to 30sec rather than ad lib or "10 sec") -seated lumbar flexion stretch 2x30 sec (not part of  HEP) -modified thomas test 2x30sec bilat (monitoring knee pain)  -unable to do HEP STS due to chair back being to high -2 sets of 5xSTS from elevated surface (24" today, encouraged seat modification at home)   - performance, no device  -678ft, then stops at 5:39 due to progressing in weakness of legs.  0.29m/s   07/16/23 (eval):  Lower Trunk Rotation 2 x 10 with 3 sec hold on each side  Seated HS Stretch 2 x 30 sec  -min VC to keep knee  straight  Sit to Stand with BUE support  1 x 5   PATIENT EDUCATION:  Education details: importance of specific dosing in HEP performance, equipment setup modifications to dial in intensity  HOME EXERCISE PROGRAM: Access Code: [facility] URL: [email or web address] Date: 07/16/2023 Prepared by: Ellin Goodie  Exercises - Sit to Stand with Counter Support  - 3-4 x weekly - 3 sets - 5 reps - Supine Lower Trunk Rotation  - 1 x daily - 2 sets - 10 reps - 3 sec  hold - Seated Hamstring Stretch  - 1 x daily - 3 reps - 30 sec  hold  ASSESSMENT:  CLINICAL IMPRESSION: Reviewed HEP, gave instruction on importance of details of dosing, specificity of time. Completed , tolerating 5 minutes 39 seconds of sustained walking prior to need to stop. Completed She will benefit from skilled PT to address these aforementioned deficits to return to performing ADLs and improve her ability to negotiate her home environment for improved quality of life.   OBJECTIVE IMPAIRMENTS: decreased endurance, decreased mobility, difficulty walking, decreased ROM, decreased strength, impaired flexibility, obesity, and pain.   ACTIVITY LIMITATIONS: carrying, lifting, bending, standing, squatting, stairs, transfers, and dressing  PARTICIPATION LIMITATIONS: cleaning, laundry, shopping, community activity, and yard work  PERSONAL FACTORS: Age, Time since onset of injury/illness/exacerbation, and 3+ comorbidities: HTN, T2DM, and CKD Stage III  are also affecting patient's functional outcome.   REHAB POTENTIAL: Fair chronicity of low back pain and multiple comordbities   CLINICAL DECISION MAKING: Stable/uncomplicated  EVALUATION COMPLEXITY: Low   GOALS: Goals reviewed with patient? No  SHORT TERM GOALS: Target date: 07/30/2023  Pt will be independent with HEP in order to improve strength and balance in order to decrease fall risk and improve function at home and work. Baseline: NT  Goal status:  INITIAL   LONG TERM GOALS: Target date: 09/24/2023  Patient will have improved function and activity level as evidenced by an increase in FOTO score by 10 points or more.  Baseline: 38 Goal status: INITIAL  2.  Pt will increase by at least 14m (168ft) in order to demonstrate clinically significant improvement in cardiopulmonary endurance and community ambulation Baseline: NT  Goal status: INITIAL  3.  Patient will be able to negotiate >=4 stairs with UE support and two feet per step as evidence of improved lumbar and hip function with improved activity tolerance and mobility.  Baseline: NT  Goal status: INITIAL  4.  Patient will be able to perform a sit to stand with no UE support as evidence of improved LE strength and lumbar function.  Baseline: Needs to use UE support  Goal status: INITIAL   PLAN:  PT FREQUENCY: 1-2x/week  PT DURATION: 10 weeks  PLANNED INTERVENTIONS: Therapeutic exercises, Neuromuscular re-education, Balance training, Gait training, Patient/Family education, Self Care, Joint mobilization, Joint manipulation, Stair training, DME instructions, Aquatic Therapy, Dry Needling, Electrical stimulation, Spinal manipulation, Spinal mobilization, Cryotherapy, Moist heat, Traction, Ultrasound, Manual therapy, and Re-evaluation.  PLAN  FOR NEXT SESSION: Progress hip and abdominal strengthening exercises, assess stair mobility    2:28 PM, 07/23/23 Rosamaria Lints, PT, DPT Physical Therapist - [place] [phone] (Office)

## 2023-07-25 ENCOUNTER — Encounter: Payer: Self-pay | Admitting: Physical Therapy

## 2023-07-25 ENCOUNTER — Ambulatory Visit: Payer: PPO | Admitting: Physical Therapy

## 2023-07-25 DIAGNOSIS — M5459 Other low back pain: Secondary | ICD-10-CM

## 2023-07-25 NOTE — Therapy (Signed)
OUTPATIENT PHYSICAL THERAPY TREATMENT  Patient Name: Stacey Pierce MRN: [id number] DOB:04-17-1941, 82 y.o., female Today's Date: 07/25/2023  END OF SESSION:  PT End of Session - 07/25/23 1258     Visit Number 3    Number of Visits 20    Date for PT Re-Evaluation 09/24/23    Authorization Type [id number]    Authorization - Visit Number 3    Authorization - Number of Visits 20    Progress Note Due on Visit 10    PT Start Time 1300    PT Stop Time 1345    PT Time Calculation (min) 45 min    Activity Tolerance Patient tolerated treatment well    Behavior During Therapy [facility] for tasks assessed/performed             Past Medical History:  Diagnosis Date   Anemia    Aortic stenosis, severe    a. 09/2017: s/p TAVR   CKD (chronic kidney disease)    Diabetes mellitus, insulin dependent (IDDM), uncontrolled    DR (diabetic retinopathy) (HCC)    GERD (gastroesophageal reflux disease)    Glaucoma    Hyperlipidemia    Hypertension    Hypothyroidism    Obesity    S/P TAVR (transcatheter aortic valve replacement) 09/04/2017   26 mm Medtronic Evolut Pro transcatheter heart valve placed via percutaneous right transfemoral approach    Past Surgical History:  Procedure Laterality Date   CATARACT EXTRACTION     left 2010   CATARACT EXTRACTION W/PHACO Right 10/13/2015   Procedure: CATARACT EXTRACTION PHACO AND INTRAOCULAR LENS PLACEMENT (IOC);  Surgeon: Lockie Mola, MD;  Location: [facility];  Service: Ophthalmology;  Laterality: Right;  DIABETIC - insulin and oral meds MALYUGIN SHUGARCAINE   CHOLECYSTECTOMY     1967   COLONOSCOPY WITH PROPOFOL N/A 05/16/2018   Procedure: COLONOSCOPY WITH PROPOFOL;  Surgeon: Toney Reil, MD;  Location: [facility] ENDOSCOPY;  Service: Gastroenterology;  Laterality: N/A;   DILATION AND CURETTAGE OF UTERUS     ESOPHAGOGASTRODUODENOSCOPY (EGD) WITH PROPOFOL N/A 05/16/2018   Procedure: ESOPHAGOGASTRODUODENOSCOPY (EGD) WITH PROPOFOL;   Surgeon: Toney Reil, MD;  Location: [facility] ENDOSCOPY;  Service: Gastroenterology;  Laterality: N/A;   EYE SURGERY     LAPAROSCOPIC TUBAL LIGATION     RIGHT/LEFT HEART CATH AND CORONARY ANGIOGRAPHY N/A 08/02/2017   Procedure: RIGHT/LEFT HEART CATH AND CORONARY ANGIOGRAPHY;  Surgeon: Lamar Blinks, MD;  Location: [facility] INVASIVE CV LAB;  Service: Cardiovascular;  Laterality: N/A;   TEE WITHOUT CARDIOVERSION N/A 09/04/2017   Procedure: TRANSESOPHAGEAL ECHOCARDIOGRAM (TEE);  Surgeon: Tonny Bollman, MD;  Location: [facility] OR;  Service: Open Heart Surgery;  Laterality: N/A;   TONSILLECTOMY     1974   TRANSCATHETER AORTIC VALVE REPLACEMENT, TRANSFEMORAL N/A 09/04/2017   Procedure: TRANSCATHETER AORTIC VALVE REPLACEMENT, TRANSFEMORAL;  Surgeon: Tonny Bollman, MD;  Location: [facility] OR;  Service: Open Heart Surgery;  Laterality: N/A;   TUBAL LIGATION     Patient Active Problem List   Diagnosis Date Noted   Venous insufficiency of both lower extremities 06/13/2018   Iron deficiency anemia due to chronic blood loss 04/10/2018   B12 deficiency anemia 03/13/2018   Dizziness 01/10/2018   Obesity    S/P TAVR (transcatheter aortic valve replacement) 09/04/2017   Aortic stenosis, severe 07/30/2017   Allergic rhinitis 08/26/2015   Airway hyperreactivity 08/26/2015   Back pain, chronic 08/26/2015   Diabetes (HCC) 08/26/2015   Degeneration of lumbar or lumbosacral intervertebral disc 08/26/2015   Acid  reflux 08/26/2015   Glaucoma 08/26/2015   HLD (hyperlipidemia) 08/26/2015   Adiposity 08/26/2015   Benign essential HTN 04/29/2015   Arteriosclerosis of coronary artery 01/26/2015   Retinopathy 11/19/2014   Chronic kidney disease (CKD), stage III (moderate) (HCC) 11/19/2014   Long-term insulin use (HCC) 11/19/2014   Adult hypothyroidism 02/17/2014    PCP: Dr. Enid Baas   REFERRING PROVIDER: Dr. Burman Freestone   REFERRING DIAG: M54.16 (ICD-10-CM) - Radiculopathy, lumbar region  Rationale  for Evaluation and Treatment: Rehabilitation  THERAPY DIAG:  Other low back pain  ONSET DATE: 07/15/2020   SUBJECTIVE:                                                                                                                                                                                           SUBJECTIVE STATEMENT:  Pt reports ongoing hip pain but otherwise she has not had a pain exacerbation since last session.   PERTINENT HISTORY:  Pt reports that she has already had several courses of shots. She was about to have her nerve ablation to relieve pain, but her physician wants to see how pt would do with physical therapy first. Pt has difficulty standing for prolonged periods of time due to pain and this has been happening for the past three years. She has pmh that include T2DM, cardiac disease, and stage III kidney failure.   PAIN:  Are you having pain? Yes, 2-3/10 NRPS in medial side of right knee   PRECAUTIONS: None  RED FLAGS: None   WEIGHT BEARING RESTRICTIONS: No  FALLS:  Has patient fallen in last 6 months? No  PATIENT GOALS: Pt was ordered to try physical therapy before the nerve ablation   OBJECTIVE:   VITALS: BP 153/46 HR 66 SpO2 97%   DIAGNOSTIC FINDINGS:  M54.16 (ICD-10-CM) - Radiculopathy, lumbar region     CLINICAL DATA:  Back and left leg pain for 3 weeks.   EXAM: MRI LUMBAR SPINE WITHOUT CONTRAST   TECHNIQUE: Multiplanar, multisequence MR imaging of the lumbar spine was performed. No intravenous contrast was administered.   COMPARISON:  11/06/2020   FINDINGS: Segmentation: There are five lumbar type vertebral bodies. The last full intervertebral disc space is labeled L5-S1. This correlates with the prior study.   Alignment:  Normal overall alignment.   Vertebrae: Normal marrow signal. No bone lesions or fractures. Moderate endplate reactive changes around L3-4 with reactive Schmorl's node.   Conus medullaris and cauda equina: Conus  extends to the L1 level. Conus and cauda equina appear normal.   Paraspinal and other soft tissues: No significant paraspinal retroperitoneal findings.   Disc levels:  T12-L1: No significant findings.   L1-2: Mild degenerative disc disease and facet disease but no disc protrusions, spinal or foraminal stenosis.   L2-3: Advanced degenerative disc disease with disc space narrowing, osteophytic spurring and bulging annulus. This in combination with moderate facet disease contributes to early spinal stenosis and moderate bilateral lateral recess stenosis. No foraminal stenosis. These findings appear relatively stable.   L3-4: Degenerative disc disease and facet disease with bulging annulus and ligamentum flavum thickening contributing to moderately severe spinal and bilateral lateral recess stenosis and mild bilateral foraminal stenosis. No significant change.   L4-5: Advanced degenerative disc disease, diffuse bulging annulus, central disc protrusion, short pedicles advanced facet disease and ligamentum flavum thickening. These factors contribute to severe spinal and bilateral lateral recess stenosis and mild to moderate bilateral foraminal stenosis. Stable to slightly progressive when compared to the prior study.   L5-S1: Advanced disc disease and facet disease but no significant spinal stenosis. Mild bilateral foraminal stenosis. Unchanged since prior study.   IMPRESSION: 1. Advanced degenerative lumbar spondylosis with multilevel disc disease and facet disease. Findings are relatively stable when compared to the prior examination. 2. Multilevel multifactorial spinal, lateral recess and foraminal stenosis as discussed above at the individual levels. The most significant level is L4-5 where there is severe spinal and bilateral lateral recess stenosis and mild to moderate bilateral foraminal stenosis. 3. Moderately severe spinal and bilateral lateral recess stenosis and  mild bilateral foraminal stenosis at L3-4. 4. Mild bilateral foraminal stenosis at L5-S1.     Electronically Signed   By: Rudie Meyer M.D.   On: 05/27/2023 07:04     PATIENT SURVEYS:  FOTO 38/100 with target of 49    SCREENING FOR RED FLAGS: Bowel or bladder incontinence: No Spinal tumors: No Cauda equina syndrome: No Compression fracture: No Abdominal aneurysm: No   COGNITION: Overall cognitive status: Within functional limits for tasks assessed                          SENSATION: WFL   MUSCLE LENGTH: Hamstrings: Right 70 deg; Left 70  deg Thomas test: Positive Bilateral    POSTURE: rounded shoulders   PALPATION: TTP over bilateral greater trochanters    LUMBAR ROM:    AROM eval  Flexion 75%*  Extension 50%*  Right lateral flexion 50%*  Left lateral flexion 50%*  Right rotation 50%*  Left rotation 50%*   (Blank rows = not tested)   LOWER EXTREMITY ROM:      Active  Right eval Left eval  Hip flexion      Hip extension      Hip abduction      Hip adduction      Hip internal rotation      Hip external rotation      Knee flexion      Knee extension      Ankle dorsiflexion      Ankle plantarflexion      Ankle inversion      Ankle eversion       (Blank rows = not tested)   LOWER EXTREMITY MMT:     MMT Right eval Left eval  Hip flexion 4- 4-  Hip extension NT  NT   Hip abduction 4- 4-  Hip adduction      Hip internal rotation      Hip external rotation      Knee flexion 4 4  Knee extension 4 4  Ankle dorsiflexion  4 4  Ankle plantarflexion      Ankle inversion      Ankle eversion       (Blank rows = not tested)   LUMBAR SPECIAL TESTS:  Straight leg raise test: Negative, FABER test: Positive , and FADIR Positive    FUNCTIONAL TESTS:  Sit to stand: Needs BUE support ot perform    GAIT: Distance walked: 50 ft  Assistive device utilized: None Level of assistance: Complete Independence Comments: Forward trunk lean    TODAY'S  TREATMENT:                                                                                                                              DATE:   07/25/23: Nu-Step with seat and arms at 8 and resistance at 2 for 5 min  Sit to Stand from 22 inch mat height with 1 UE support 1 x 10  Sit to Stand from 22 inch mat height with pushing down on knees 2 x 10  Standing Hip Abduction with BUE support 3 x 10  Standing Marches with BUE support 2 x 10  -Pt reports feeling light headed because blood sugar is low  PT gave pt a apple juice box and had her drink juice   07/23/23: *HEP review -hamstrings stretch 2x30sec bilat (asked to stick to 30sec rather than ad lib or "10 sec") -seated lumbar flexion stretch 2x30 sec (not part of HEP) -modified thomas test 2x30sec bilat (monitoring knee pain)  -unable to do HEP STS due to chair back being to high -2 sets of 5xSTS from elevated surface (24" today, encouraged seat modification at home)   - performance, no device  -660ft, then stops at 5:39 due to progressing in weakness of legs.  0.20m/s   07/16/23 (eval):  Lower Trunk Rotation 2 x 10 with 3 sec hold on each side  Seated HS Stretch 2 x 30 sec  -min VC to keep knee straight  Sit to Stand with BUE support  1 x 5   PATIENT EDUCATION:  Education details: importance of specific dosing in HEP performance, equipment setup modifications to dial in intensity  HOME EXERCISE PROGRAM: Access Code: [facility] URL: [email or web address] Date: 07/16/2023 Prepared by: Ellin Goodie  Exercises - Sit to Stand with Counter Support  - 3-4 x weekly - 3 sets - 5 reps - Supine Lower Trunk Rotation  - 1 x daily - 2 sets - 10 reps - 3 sec  hold - Seated Hamstring Stretch  - 1 x daily - 3 reps - 30 sec  hold  ASSESSMENT:  CLINICAL IMPRESSION: Pt limited by low blood sugar in session with early termination of session due to light headedness. Symptoms did resolve after pt had juice, but she did not  feel comfortable exercising more. Pt did show ongoing progress towards goals with completing of high volume of hip strengthening exercises without an increase in her symptoms.  She will continue to benefit  from skilled PT to address these aforementioned deficits to return to performing ADLs and improve her ability to negotiate her home environment for improved quality of life.   OBJECTIVE IMPAIRMENTS: decreased endurance, decreased mobility, difficulty walking, decreased ROM, decreased strength, impaired flexibility, obesity, and pain.   ACTIVITY LIMITATIONS: carrying, lifting, bending, standing, squatting, stairs, transfers, and dressing  PARTICIPATION LIMITATIONS: cleaning, laundry, shopping, community activity, and yard work  PERSONAL FACTORS: Age, Time since onset of injury/illness/exacerbation, and 3+ comorbidities: HTN, T2DM, and CKD Stage III  are also affecting patient's functional outcome.   REHAB POTENTIAL: Fair chronicity of low back pain and multiple comordbities   CLINICAL DECISION MAKING: Stable/uncomplicated  EVALUATION COMPLEXITY: Low   GOALS: Goals reviewed with patient? No  SHORT TERM GOALS: Target date: 07/30/2023  Pt will be independent with HEP in order to improve strength and balance in order to decrease fall risk and improve function at home and work. Baseline: NT  07/25/23: Able to perform independently  Goal status: ACHIEVED     LONG TERM GOALS: Target date: 09/24/2023  Patient will have improved function and activity level as evidenced by an increase in FOTO score by 10 points or more.  Baseline: 38 Goal status: ONGOING   2.  Pt will increase by at least 16m (115ft) in order to demonstrate clinically significant improvement in cardiopulmonary endurance and community ambulation Baseline: 600 ft  Goal status: ONGOING   3.  Patient will be able to negotiate >=4 stairs with UE support and two feet per step as evidence of improved lumbar and hip function  with improved activity tolerance and mobility.  Baseline: NT  Goal status: INITIAL  4.  Patient will be able to perform a sit to stand with no UE support as evidence of improved LE strength and lumbar function.  Baseline: Needs to use UE support  Goal status: ONGOING    PLAN:  PT FREQUENCY: 1-2x/week  PT DURATION: 10 weeks  PLANNED INTERVENTIONS: Therapeutic exercises, Neuromuscular re-education, Balance training, Gait training, Patient/Family education, Self Care, Joint mobilization, Joint manipulation, Stair training, DME instructions, Aquatic Therapy, Dry Needling, Electrical stimulation, Spinal manipulation, Spinal mobilization, Cryotherapy, Moist heat, Traction, Ultrasound, Manual therapy, and Re-evaluation.  PLAN FOR NEXT SESSION: Exercise circuits: standing marches, hip abduction, etc with overground ambulation in between.  Ellin Goodie PT, DPT  [facility] [address], [place], [address] Phone: [phone]   Fax:  [phone]

## 2023-07-30 ENCOUNTER — Ambulatory Visit: Payer: PPO | Admitting: Physical Therapy

## 2023-07-31 DIAGNOSIS — M8588 Other specified disorders of bone density and structure, other site: Secondary | ICD-10-CM | POA: Diagnosis not present

## 2023-08-01 ENCOUNTER — Encounter: Payer: HMO | Admitting: Physical Therapy

## 2023-08-08 ENCOUNTER — Ambulatory Visit: Payer: PPO | Admitting: Physical Therapy

## 2023-08-13 ENCOUNTER — Ambulatory Visit: Payer: PPO | Admitting: Physical Therapy

## 2023-08-15 ENCOUNTER — Ambulatory Visit: Payer: PPO | Admitting: Physical Therapy

## 2023-08-15 DIAGNOSIS — M47816 Spondylosis without myelopathy or radiculopathy, lumbar region: Secondary | ICD-10-CM | POA: Diagnosis not present

## 2023-08-20 ENCOUNTER — Ambulatory Visit: Payer: PPO | Admitting: Physical Therapy

## 2023-08-22 ENCOUNTER — Ambulatory Visit: Payer: PPO | Attending: Family Medicine | Admitting: Physical Therapy

## 2023-08-22 ENCOUNTER — Telehealth: Payer: Self-pay | Admitting: Physical Therapy

## 2023-08-22 NOTE — Telephone Encounter (Signed)
Called pt to inquire about absence from today's. Unable to reach so left VM informing pt about attendance policy and that she needs to reschedule instead of no show. As a result, pt will be removed from schedule because of ongoing no shows.

## 2023-08-27 ENCOUNTER — Ambulatory Visit: Payer: PPO | Admitting: Physical Therapy

## 2023-08-28 DIAGNOSIS — E1122 Type 2 diabetes mellitus with diabetic chronic kidney disease: Secondary | ICD-10-CM | POA: Diagnosis not present

## 2023-08-28 DIAGNOSIS — N184 Chronic kidney disease, stage 4 (severe): Secondary | ICD-10-CM | POA: Diagnosis not present

## 2023-08-28 DIAGNOSIS — I129 Hypertensive chronic kidney disease with stage 1 through stage 4 chronic kidney disease, or unspecified chronic kidney disease: Secondary | ICD-10-CM | POA: Diagnosis not present

## 2023-08-28 DIAGNOSIS — N2581 Secondary hyperparathyroidism of renal origin: Secondary | ICD-10-CM | POA: Diagnosis not present

## 2023-08-28 DIAGNOSIS — R809 Proteinuria, unspecified: Secondary | ICD-10-CM | POA: Diagnosis not present

## 2023-08-28 DIAGNOSIS — D631 Anemia in chronic kidney disease: Secondary | ICD-10-CM | POA: Diagnosis not present

## 2023-08-29 ENCOUNTER — Encounter: Payer: HMO | Admitting: Physical Therapy

## 2023-08-29 DIAGNOSIS — H401131 Primary open-angle glaucoma, bilateral, mild stage: Secondary | ICD-10-CM | POA: Diagnosis not present

## 2023-09-03 ENCOUNTER — Encounter: Payer: HMO | Admitting: Physical Therapy

## 2023-09-04 DIAGNOSIS — E113293 Type 2 diabetes mellitus with mild nonproliferative diabetic retinopathy without macular edema, bilateral: Secondary | ICD-10-CM | POA: Diagnosis not present

## 2023-09-04 DIAGNOSIS — Z961 Presence of intraocular lens: Secondary | ICD-10-CM | POA: Diagnosis not present

## 2023-09-04 DIAGNOSIS — H401131 Primary open-angle glaucoma, bilateral, mild stage: Secondary | ICD-10-CM | POA: Diagnosis not present

## 2023-09-05 ENCOUNTER — Encounter: Payer: HMO | Admitting: Physical Therapy

## 2023-09-05 DIAGNOSIS — I129 Hypertensive chronic kidney disease with stage 1 through stage 4 chronic kidney disease, or unspecified chronic kidney disease: Secondary | ICD-10-CM | POA: Diagnosis not present

## 2023-09-05 DIAGNOSIS — R809 Proteinuria, unspecified: Secondary | ICD-10-CM | POA: Diagnosis not present

## 2023-09-05 DIAGNOSIS — D631 Anemia in chronic kidney disease: Secondary | ICD-10-CM | POA: Diagnosis not present

## 2023-09-05 DIAGNOSIS — N184 Chronic kidney disease, stage 4 (severe): Secondary | ICD-10-CM | POA: Diagnosis not present

## 2023-09-05 DIAGNOSIS — E1122 Type 2 diabetes mellitus with diabetic chronic kidney disease: Secondary | ICD-10-CM | POA: Diagnosis not present

## 2023-09-05 DIAGNOSIS — N2581 Secondary hyperparathyroidism of renal origin: Secondary | ICD-10-CM | POA: Diagnosis not present

## 2023-09-10 ENCOUNTER — Encounter: Payer: HMO | Admitting: Physical Therapy

## 2023-09-12 ENCOUNTER — Encounter: Payer: HMO | Admitting: Physical Therapy

## 2023-09-17 ENCOUNTER — Encounter: Payer: HMO | Admitting: Physical Therapy

## 2023-09-19 ENCOUNTER — Encounter: Payer: HMO | Admitting: Physical Therapy

## 2023-09-24 ENCOUNTER — Encounter: Payer: HMO | Admitting: Physical Therapy

## 2023-09-25 DIAGNOSIS — N184 Chronic kidney disease, stage 4 (severe): Secondary | ICD-10-CM | POA: Diagnosis not present

## 2023-09-25 DIAGNOSIS — E1122 Type 2 diabetes mellitus with diabetic chronic kidney disease: Secondary | ICD-10-CM | POA: Diagnosis not present

## 2023-09-25 DIAGNOSIS — N1831 Chronic kidney disease, stage 3a: Secondary | ICD-10-CM | POA: Diagnosis not present

## 2023-09-25 DIAGNOSIS — Z794 Long term (current) use of insulin: Secondary | ICD-10-CM | POA: Diagnosis not present

## 2023-09-26 ENCOUNTER — Encounter: Payer: HMO | Admitting: Physical Therapy

## 2023-10-01 ENCOUNTER — Encounter: Payer: HMO | Admitting: Physical Therapy

## 2023-10-02 DIAGNOSIS — R809 Proteinuria, unspecified: Secondary | ICD-10-CM | POA: Diagnosis not present

## 2023-10-02 DIAGNOSIS — N184 Chronic kidney disease, stage 4 (severe): Secondary | ICD-10-CM | POA: Diagnosis not present

## 2023-10-02 DIAGNOSIS — I10 Essential (primary) hypertension: Secondary | ICD-10-CM | POA: Diagnosis not present

## 2023-10-02 DIAGNOSIS — E11319 Type 2 diabetes mellitus with unspecified diabetic retinopathy without macular edema: Secondary | ICD-10-CM | POA: Diagnosis not present

## 2023-10-02 DIAGNOSIS — Z794 Long term (current) use of insulin: Secondary | ICD-10-CM | POA: Diagnosis not present

## 2023-10-02 DIAGNOSIS — E1122 Type 2 diabetes mellitus with diabetic chronic kidney disease: Secondary | ICD-10-CM | POA: Diagnosis not present

## 2023-10-02 DIAGNOSIS — E785 Hyperlipidemia, unspecified: Secondary | ICD-10-CM | POA: Diagnosis not present

## 2023-10-02 DIAGNOSIS — E1142 Type 2 diabetes mellitus with diabetic polyneuropathy: Secondary | ICD-10-CM | POA: Diagnosis not present

## 2023-10-02 DIAGNOSIS — E1169 Type 2 diabetes mellitus with other specified complication: Secondary | ICD-10-CM | POA: Diagnosis not present

## 2023-10-02 DIAGNOSIS — E1129 Type 2 diabetes mellitus with other diabetic kidney complication: Secondary | ICD-10-CM | POA: Diagnosis not present

## 2023-10-03 ENCOUNTER — Encounter: Payer: HMO | Admitting: Physical Therapy

## 2023-10-17 ENCOUNTER — Ambulatory Visit: Payer: HMO | Admitting: Dermatology

## 2023-11-14 DIAGNOSIS — M4800 Spinal stenosis, site unspecified: Secondary | ICD-10-CM | POA: Diagnosis not present

## 2023-11-14 DIAGNOSIS — N1831 Chronic kidney disease, stage 3a: Secondary | ICD-10-CM | POA: Diagnosis not present

## 2023-11-14 DIAGNOSIS — E1122 Type 2 diabetes mellitus with diabetic chronic kidney disease: Secondary | ICD-10-CM | POA: Diagnosis not present

## 2023-11-14 DIAGNOSIS — M160 Bilateral primary osteoarthritis of hip: Secondary | ICD-10-CM | POA: Diagnosis not present

## 2023-11-14 DIAGNOSIS — M25552 Pain in left hip: Secondary | ICD-10-CM | POA: Diagnosis not present

## 2023-11-14 DIAGNOSIS — M51369 Other intervertebral disc degeneration, lumbar region without mention of lumbar back pain or lower extremity pain: Secondary | ICD-10-CM | POA: Diagnosis not present

## 2023-11-14 DIAGNOSIS — G8929 Other chronic pain: Secondary | ICD-10-CM | POA: Diagnosis not present

## 2023-11-14 DIAGNOSIS — I700 Atherosclerosis of aorta: Secondary | ICD-10-CM | POA: Diagnosis not present

## 2023-11-14 DIAGNOSIS — M533 Sacrococcygeal disorders, not elsewhere classified: Secondary | ICD-10-CM | POA: Diagnosis not present

## 2023-11-14 DIAGNOSIS — E559 Vitamin D deficiency, unspecified: Secondary | ICD-10-CM | POA: Diagnosis not present

## 2023-11-14 DIAGNOSIS — Z794 Long term (current) use of insulin: Secondary | ICD-10-CM | POA: Diagnosis not present

## 2023-11-14 DIAGNOSIS — E039 Hypothyroidism, unspecified: Secondary | ICD-10-CM | POA: Diagnosis not present

## 2023-11-14 DIAGNOSIS — M545 Low back pain, unspecified: Secondary | ICD-10-CM | POA: Diagnosis not present

## 2023-12-11 DIAGNOSIS — M48062 Spinal stenosis, lumbar region with neurogenic claudication: Secondary | ICD-10-CM | POA: Diagnosis not present

## 2023-12-11 DIAGNOSIS — M5416 Radiculopathy, lumbar region: Secondary | ICD-10-CM | POA: Diagnosis not present

## 2023-12-11 DIAGNOSIS — M47816 Spondylosis without myelopathy or radiculopathy, lumbar region: Secondary | ICD-10-CM | POA: Diagnosis not present

## 2023-12-20 DIAGNOSIS — M48062 Spinal stenosis, lumbar region with neurogenic claudication: Secondary | ICD-10-CM | POA: Diagnosis not present

## 2023-12-20 DIAGNOSIS — M5416 Radiculopathy, lumbar region: Secondary | ICD-10-CM | POA: Diagnosis not present

## 2023-12-25 DIAGNOSIS — M48062 Spinal stenosis, lumbar region with neurogenic claudication: Secondary | ICD-10-CM | POA: Diagnosis not present

## 2023-12-25 DIAGNOSIS — Z9181 History of falling: Secondary | ICD-10-CM | POA: Diagnosis not present

## 2023-12-25 DIAGNOSIS — M4800 Spinal stenosis, site unspecified: Secondary | ICD-10-CM | POA: Diagnosis not present

## 2023-12-25 DIAGNOSIS — M25552 Pain in left hip: Secondary | ICD-10-CM | POA: Diagnosis not present

## 2023-12-25 DIAGNOSIS — N1831 Chronic kidney disease, stage 3a: Secondary | ICD-10-CM | POA: Diagnosis not present

## 2023-12-25 DIAGNOSIS — N184 Chronic kidney disease, stage 4 (severe): Secondary | ICD-10-CM | POA: Diagnosis not present

## 2023-12-25 DIAGNOSIS — E039 Hypothyroidism, unspecified: Secondary | ICD-10-CM | POA: Diagnosis not present

## 2023-12-25 DIAGNOSIS — E1122 Type 2 diabetes mellitus with diabetic chronic kidney disease: Secondary | ICD-10-CM | POA: Diagnosis not present

## 2023-12-25 DIAGNOSIS — M51371 Other intervertebral disc degeneration, lumbosacral region with lower extremity pain only: Secondary | ICD-10-CM | POA: Diagnosis not present

## 2023-12-25 DIAGNOSIS — Z794 Long term (current) use of insulin: Secondary | ICD-10-CM | POA: Diagnosis not present

## 2023-12-25 DIAGNOSIS — M533 Sacrococcygeal disorders, not elsewhere classified: Secondary | ICD-10-CM | POA: Diagnosis not present

## 2023-12-26 DIAGNOSIS — Z952 Presence of prosthetic heart valve: Secondary | ICD-10-CM | POA: Diagnosis not present

## 2023-12-26 DIAGNOSIS — I10 Essential (primary) hypertension: Secondary | ICD-10-CM | POA: Diagnosis not present

## 2023-12-26 DIAGNOSIS — E782 Mixed hyperlipidemia: Secondary | ICD-10-CM | POA: Diagnosis not present

## 2024-01-08 DIAGNOSIS — M48062 Spinal stenosis, lumbar region with neurogenic claudication: Secondary | ICD-10-CM | POA: Diagnosis not present

## 2024-01-08 DIAGNOSIS — E1129 Type 2 diabetes mellitus with other diabetic kidney complication: Secondary | ICD-10-CM | POA: Diagnosis not present

## 2024-01-08 DIAGNOSIS — R809 Proteinuria, unspecified: Secondary | ICD-10-CM | POA: Diagnosis not present

## 2024-01-08 DIAGNOSIS — M5416 Radiculopathy, lumbar region: Secondary | ICD-10-CM | POA: Diagnosis not present

## 2024-01-08 DIAGNOSIS — Z794 Long term (current) use of insulin: Secondary | ICD-10-CM | POA: Diagnosis not present

## 2024-01-11 DIAGNOSIS — Z9181 History of falling: Secondary | ICD-10-CM | POA: Diagnosis not present

## 2024-01-11 DIAGNOSIS — M4800 Spinal stenosis, site unspecified: Secondary | ICD-10-CM | POA: Diagnosis not present

## 2024-01-15 DIAGNOSIS — N184 Chronic kidney disease, stage 4 (severe): Secondary | ICD-10-CM | POA: Diagnosis not present

## 2024-01-15 DIAGNOSIS — E785 Hyperlipidemia, unspecified: Secondary | ICD-10-CM | POA: Diagnosis not present

## 2024-01-15 DIAGNOSIS — E1142 Type 2 diabetes mellitus with diabetic polyneuropathy: Secondary | ICD-10-CM | POA: Diagnosis not present

## 2024-01-15 DIAGNOSIS — E1169 Type 2 diabetes mellitus with other specified complication: Secondary | ICD-10-CM | POA: Diagnosis not present

## 2024-01-15 DIAGNOSIS — E1129 Type 2 diabetes mellitus with other diabetic kidney complication: Secondary | ICD-10-CM | POA: Diagnosis not present

## 2024-01-15 DIAGNOSIS — E11319 Type 2 diabetes mellitus with unspecified diabetic retinopathy without macular edema: Secondary | ICD-10-CM | POA: Diagnosis not present

## 2024-01-15 DIAGNOSIS — E1122 Type 2 diabetes mellitus with diabetic chronic kidney disease: Secondary | ICD-10-CM | POA: Diagnosis not present

## 2024-01-15 DIAGNOSIS — Z794 Long term (current) use of insulin: Secondary | ICD-10-CM | POA: Diagnosis not present

## 2024-01-15 DIAGNOSIS — I10 Essential (primary) hypertension: Secondary | ICD-10-CM | POA: Diagnosis not present

## 2024-01-15 DIAGNOSIS — R809 Proteinuria, unspecified: Secondary | ICD-10-CM | POA: Diagnosis not present

## 2024-01-29 ENCOUNTER — Ambulatory Visit: Payer: PPO | Admitting: Dermatology

## 2024-01-29 ENCOUNTER — Encounter: Payer: Self-pay | Admitting: Dermatology

## 2024-01-29 DIAGNOSIS — Z872 Personal history of diseases of the skin and subcutaneous tissue: Secondary | ICD-10-CM | POA: Diagnosis not present

## 2024-01-29 DIAGNOSIS — D1801 Hemangioma of skin and subcutaneous tissue: Secondary | ICD-10-CM | POA: Diagnosis not present

## 2024-01-29 DIAGNOSIS — L814 Other melanin hyperpigmentation: Secondary | ICD-10-CM

## 2024-01-29 DIAGNOSIS — W908XXA Exposure to other nonionizing radiation, initial encounter: Secondary | ICD-10-CM | POA: Diagnosis not present

## 2024-01-29 DIAGNOSIS — L820 Inflamed seborrheic keratosis: Secondary | ICD-10-CM | POA: Diagnosis not present

## 2024-01-29 DIAGNOSIS — Z1283 Encounter for screening for malignant neoplasm of skin: Secondary | ICD-10-CM | POA: Diagnosis not present

## 2024-01-29 DIAGNOSIS — D229 Melanocytic nevi, unspecified: Secondary | ICD-10-CM

## 2024-01-29 DIAGNOSIS — L578 Other skin changes due to chronic exposure to nonionizing radiation: Secondary | ICD-10-CM | POA: Diagnosis not present

## 2024-01-29 DIAGNOSIS — L821 Other seborrheic keratosis: Secondary | ICD-10-CM | POA: Diagnosis not present

## 2024-01-29 MED ORDER — HYDROCORTISONE 2.5 % EX CREA: TOPICAL_CREAM | Freq: Two times a day (BID) | CUTANEOUS | 3 refills | Status: DC | PRN

## 2024-01-29 NOTE — Patient Instructions (Addendum)

## 2024-01-29 NOTE — Progress Notes (Signed)
   Follow-Up Visit   Subjective  Stacey Pierce is a 83 y.o. female who presents for the following: Skin Cancer Screening and Upper Body Skin Exam and AK follow up. Pt also requesting prescription for Hydrocortisone 2.5% cream. Pt does have places at forehead, face, neck, R forearm that are irritating.  The patient presents for Upper Body Skin Exam (UBSE) for skin cancer screening and mole check. The patient has spots, moles and lesions to be evaluated, some may be new or changing and the patient may have concern these could be cancer.   The following portions of the chart were reviewed this encounter and updated as appropriate: medications, allergies, medical history  Review of Systems:  No other skin or systemic complaints except as noted in HPI or Assessment and Plan.  Objective  Well appearing patient in no apparent distress; mood and affect are within normal limits.  All skin waist up examined. Relevant physical exam findings are noted in the Assessment and Plan.  face/back/neck/chest x 30 (30) Stuck on waxy paps with erythema  Assessment & Plan   INFLAMED SEBORRHEIC KERATOSIS (30) face/back/neck/chest x 30 (30) Symptomatic, irritating, patient would like treated. Destruction of lesion - face/back/neck/chest x 30 (30) Complexity: simple   Destruction method: cryotherapy   Informed consent: discussed and consent obtained   Timeout:  patient name, date of birth, surgical site, and procedure verified Lesion destroyed using liquid nitrogen: Yes   Region frozen until ice ball extended beyond lesion: Yes   Cryo cycles: 1 or 2. Outcome: patient tolerated procedure well with no complications   Post-procedure details: wound care instructions given   SEBORRHEIC KERATOSES   MULTIPLE BENIGN NEVI   CHERRY ANGIOMA   LENTIGINES   Skin cancer screening performed today.  Actinic Damage - Chronic condition, secondary to cumulative UV/sun exposure - diffuse scaly erythematous  macules with underlying dyspigmentation - Recommend daily broad spectrum sunscreen SPF 30+ to sun-exposed areas, reapply every 2 hours as needed.  - Staying in the shade or wearing long sleeves, sun glasses (UVA+UVB protection) and wide brim hats (4-inch brim around the entire circumference of the hat) are also recommended for sun protection.  - Call for new or changing lesions.  Lentigines, Seborrheic Keratoses, Hemangiomas - Benign normal skin lesions - Benign-appearing - Call for any changes  Melanocytic Nevi - Tan-brown and/or pink-flesh-colored symmetric macules and papules - Benign appearing on exam today - Observation - Call clinic for new or changing moles - Recommend daily use of broad spectrum spf 30+ sunscreen to sun-exposed areas.    HISTORY OF PRECANCEROUS ACTINIC KERATOSIS - site(s) of PreCancerous Actinic Keratosis clear today. - these may recur and new lesions may form requiring treatment to prevent transformation into skin cancer - observe for new or changing spots and contact [facility] for appointment if occur - photoprotection with sun protective clothing; sunglasses and broad spectrum sunscreen with SPF of at least 30 + and frequent self skin exams recommended - yearly exams by a dermatologist recommended for persons with history of PreCancerous Actinic Keratoses     Return in about 4 months (around 05/28/2024) for TBSE, w/ Dr. Katrinka Blazing.  Wynonia Lawman, CMA, am acting as scribe for Elie Goody, MD .   Documentation: I have reviewed the above documentation for accuracy and completeness, and I agree with the above.  Elie Goody, MD

## 2024-02-13 DIAGNOSIS — M48062 Spinal stenosis, lumbar region with neurogenic claudication: Secondary | ICD-10-CM | POA: Diagnosis not present

## 2024-02-13 DIAGNOSIS — M5416 Radiculopathy, lumbar region: Secondary | ICD-10-CM | POA: Diagnosis not present

## 2024-02-28 DIAGNOSIS — R809 Proteinuria, unspecified: Secondary | ICD-10-CM | POA: Diagnosis not present

## 2024-02-28 DIAGNOSIS — N184 Chronic kidney disease, stage 4 (severe): Secondary | ICD-10-CM | POA: Diagnosis not present

## 2024-02-28 DIAGNOSIS — N2581 Secondary hyperparathyroidism of renal origin: Secondary | ICD-10-CM | POA: Diagnosis not present

## 2024-02-28 DIAGNOSIS — D631 Anemia in chronic kidney disease: Secondary | ICD-10-CM | POA: Diagnosis not present

## 2024-02-28 DIAGNOSIS — I129 Hypertensive chronic kidney disease with stage 1 through stage 4 chronic kidney disease, or unspecified chronic kidney disease: Secondary | ICD-10-CM | POA: Diagnosis not present

## 2024-02-28 DIAGNOSIS — I10 Essential (primary) hypertension: Secondary | ICD-10-CM | POA: Diagnosis not present

## 2024-02-28 DIAGNOSIS — E1122 Type 2 diabetes mellitus with diabetic chronic kidney disease: Secondary | ICD-10-CM | POA: Diagnosis not present

## 2024-03-05 DIAGNOSIS — E1122 Type 2 diabetes mellitus with diabetic chronic kidney disease: Secondary | ICD-10-CM | POA: Diagnosis not present

## 2024-03-05 DIAGNOSIS — R809 Proteinuria, unspecified: Secondary | ICD-10-CM | POA: Diagnosis not present

## 2024-03-05 DIAGNOSIS — N2581 Secondary hyperparathyroidism of renal origin: Secondary | ICD-10-CM | POA: Diagnosis not present

## 2024-03-05 DIAGNOSIS — N184 Chronic kidney disease, stage 4 (severe): Secondary | ICD-10-CM | POA: Diagnosis not present

## 2024-03-05 DIAGNOSIS — I10 Essential (primary) hypertension: Secondary | ICD-10-CM | POA: Diagnosis not present

## 2024-03-05 DIAGNOSIS — I129 Hypertensive chronic kidney disease with stage 1 through stage 4 chronic kidney disease, or unspecified chronic kidney disease: Secondary | ICD-10-CM | POA: Diagnosis not present

## 2024-03-05 DIAGNOSIS — D631 Anemia in chronic kidney disease: Secondary | ICD-10-CM | POA: Diagnosis not present

## 2024-03-11 DIAGNOSIS — Z961 Presence of intraocular lens: Secondary | ICD-10-CM | POA: Diagnosis not present

## 2024-03-11 DIAGNOSIS — H02403 Unspecified ptosis of bilateral eyelids: Secondary | ICD-10-CM | POA: Diagnosis not present

## 2024-03-11 DIAGNOSIS — E113293 Type 2 diabetes mellitus with mild nonproliferative diabetic retinopathy without macular edema, bilateral: Secondary | ICD-10-CM | POA: Diagnosis not present

## 2024-03-11 DIAGNOSIS — H401131 Primary open-angle glaucoma, bilateral, mild stage: Secondary | ICD-10-CM | POA: Diagnosis not present

## 2024-03-25 DIAGNOSIS — E1122 Type 2 diabetes mellitus with diabetic chronic kidney disease: Secondary | ICD-10-CM | POA: Diagnosis not present

## 2024-03-25 DIAGNOSIS — M4800 Spinal stenosis, site unspecified: Secondary | ICD-10-CM | POA: Diagnosis not present

## 2024-03-25 DIAGNOSIS — N1831 Chronic kidney disease, stage 3a: Secondary | ICD-10-CM | POA: Diagnosis not present

## 2024-03-25 DIAGNOSIS — E039 Hypothyroidism, unspecified: Secondary | ICD-10-CM | POA: Diagnosis not present

## 2024-03-25 DIAGNOSIS — Z794 Long term (current) use of insulin: Secondary | ICD-10-CM | POA: Diagnosis not present

## 2024-03-25 DIAGNOSIS — N184 Chronic kidney disease, stage 4 (severe): Secondary | ICD-10-CM | POA: Diagnosis not present

## 2024-04-09 DIAGNOSIS — M48062 Spinal stenosis, lumbar region with neurogenic claudication: Secondary | ICD-10-CM | POA: Diagnosis not present

## 2024-04-09 DIAGNOSIS — M5416 Radiculopathy, lumbar region: Secondary | ICD-10-CM | POA: Diagnosis not present

## 2024-04-09 DIAGNOSIS — M4807 Spinal stenosis, lumbosacral region: Secondary | ICD-10-CM | POA: Diagnosis not present

## 2024-04-10 ENCOUNTER — Other Ambulatory Visit: Payer: Self-pay | Admitting: Family Medicine

## 2024-04-10 DIAGNOSIS — M5416 Radiculopathy, lumbar region: Secondary | ICD-10-CM

## 2024-04-11 ENCOUNTER — Encounter: Payer: Self-pay | Admitting: Oncology

## 2024-04-15 ENCOUNTER — Ambulatory Visit
Admission: RE | Admit: 2024-04-15 | Discharge: 2024-04-15 | Disposition: A | Discharge status: Home / Self Care | Source: Ambulatory Visit | Attending: Family Medicine | Admitting: Family Medicine

## 2024-04-15 DIAGNOSIS — M48061 Spinal stenosis, lumbar region without neurogenic claudication: Secondary | ICD-10-CM | POA: Diagnosis not present

## 2024-04-15 DIAGNOSIS — M5416 Radiculopathy, lumbar region: Secondary | ICD-10-CM

## 2024-04-16 DIAGNOSIS — M79605 Pain in left leg: Secondary | ICD-10-CM | POA: Diagnosis not present

## 2024-04-16 DIAGNOSIS — R2689 Other abnormalities of gait and mobility: Secondary | ICD-10-CM | POA: Diagnosis not present

## 2024-04-16 DIAGNOSIS — M5451 Vertebrogenic low back pain: Secondary | ICD-10-CM | POA: Diagnosis not present

## 2024-04-18 DIAGNOSIS — M5451 Vertebrogenic low back pain: Secondary | ICD-10-CM | POA: Diagnosis not present

## 2024-04-18 DIAGNOSIS — M79605 Pain in left leg: Secondary | ICD-10-CM | POA: Diagnosis not present

## 2024-04-18 DIAGNOSIS — R2689 Other abnormalities of gait and mobility: Secondary | ICD-10-CM | POA: Diagnosis not present

## 2024-04-22 DIAGNOSIS — E1142 Type 2 diabetes mellitus with diabetic polyneuropathy: Secondary | ICD-10-CM | POA: Diagnosis not present

## 2024-04-23 DIAGNOSIS — M79605 Pain in left leg: Secondary | ICD-10-CM | POA: Diagnosis not present

## 2024-04-23 DIAGNOSIS — M5451 Vertebrogenic low back pain: Secondary | ICD-10-CM | POA: Diagnosis not present

## 2024-04-23 DIAGNOSIS — R2689 Other abnormalities of gait and mobility: Secondary | ICD-10-CM | POA: Diagnosis not present

## 2024-04-29 DIAGNOSIS — R809 Proteinuria, unspecified: Secondary | ICD-10-CM | POA: Diagnosis not present

## 2024-04-29 DIAGNOSIS — N184 Chronic kidney disease, stage 4 (severe): Secondary | ICD-10-CM | POA: Diagnosis not present

## 2024-04-29 DIAGNOSIS — E1169 Type 2 diabetes mellitus with other specified complication: Secondary | ICD-10-CM | POA: Diagnosis not present

## 2024-04-29 DIAGNOSIS — E1142 Type 2 diabetes mellitus with diabetic polyneuropathy: Secondary | ICD-10-CM | POA: Diagnosis not present

## 2024-04-29 DIAGNOSIS — M5451 Vertebrogenic low back pain: Secondary | ICD-10-CM | POA: Diagnosis not present

## 2024-04-29 DIAGNOSIS — E1122 Type 2 diabetes mellitus with diabetic chronic kidney disease: Secondary | ICD-10-CM | POA: Diagnosis not present

## 2024-04-29 DIAGNOSIS — Z794 Long term (current) use of insulin: Secondary | ICD-10-CM | POA: Diagnosis not present

## 2024-04-29 DIAGNOSIS — E785 Hyperlipidemia, unspecified: Secondary | ICD-10-CM | POA: Diagnosis not present

## 2024-04-29 DIAGNOSIS — E11319 Type 2 diabetes mellitus with unspecified diabetic retinopathy without macular edema: Secondary | ICD-10-CM | POA: Diagnosis not present

## 2024-04-29 DIAGNOSIS — M79605 Pain in left leg: Secondary | ICD-10-CM | POA: Diagnosis not present

## 2024-04-29 DIAGNOSIS — I10 Essential (primary) hypertension: Secondary | ICD-10-CM | POA: Diagnosis not present

## 2024-04-29 DIAGNOSIS — E1129 Type 2 diabetes mellitus with other diabetic kidney complication: Secondary | ICD-10-CM | POA: Diagnosis not present

## 2024-04-29 DIAGNOSIS — R2689 Other abnormalities of gait and mobility: Secondary | ICD-10-CM | POA: Diagnosis not present

## 2024-05-01 DIAGNOSIS — M5451 Vertebrogenic low back pain: Secondary | ICD-10-CM | POA: Diagnosis not present

## 2024-05-01 DIAGNOSIS — M79605 Pain in left leg: Secondary | ICD-10-CM | POA: Diagnosis not present

## 2024-05-01 DIAGNOSIS — R2689 Other abnormalities of gait and mobility: Secondary | ICD-10-CM | POA: Diagnosis not present

## 2024-05-06 DIAGNOSIS — M79605 Pain in left leg: Secondary | ICD-10-CM | POA: Diagnosis not present

## 2024-05-06 DIAGNOSIS — M5451 Vertebrogenic low back pain: Secondary | ICD-10-CM | POA: Diagnosis not present

## 2024-05-06 DIAGNOSIS — R2689 Other abnormalities of gait and mobility: Secondary | ICD-10-CM | POA: Diagnosis not present

## 2024-05-08 DIAGNOSIS — M5451 Vertebrogenic low back pain: Secondary | ICD-10-CM | POA: Diagnosis not present

## 2024-05-08 DIAGNOSIS — R2689 Other abnormalities of gait and mobility: Secondary | ICD-10-CM | POA: Diagnosis not present

## 2024-05-08 DIAGNOSIS — M79605 Pain in left leg: Secondary | ICD-10-CM | POA: Diagnosis not present

## 2024-05-13 DIAGNOSIS — R2689 Other abnormalities of gait and mobility: Secondary | ICD-10-CM | POA: Diagnosis not present

## 2024-05-13 DIAGNOSIS — M5451 Vertebrogenic low back pain: Secondary | ICD-10-CM | POA: Diagnosis not present

## 2024-05-13 DIAGNOSIS — M79605 Pain in left leg: Secondary | ICD-10-CM | POA: Diagnosis not present

## 2024-05-19 NOTE — Progress Notes (Signed)
 Referring Physician:  Rex Castor, MD [address] [place],  [place] [address]  Primary Physician:  Rex Castor, MD  History of Present Illness: 05/22/2024 Stacey Pierce is here today with a chief complaint of chronic back pain who presents with worsening mobility issues.  She experiences significant back pain that has worsened over the past year, affecting her mobility. The pain originates in her back and radiates to her buttocks and hips, without extending down her legs. It intensifies with standing or walking, requiring support for ambulation and stepping. Five injections, including an epidural, and physical therapy have not provided relief.  Her mobility has declined over the past six months. Previously able to bowl and walk comfortably, she now requires assistance for walking and struggles with basic movements like stepping off a curb. Discomfort in her hips and buttocks occurs when standing for more than five minutes.  No numbness or tingling in her legs is present, and she does not experience pain in the back of her legs.  Bowel/Bladder Dysfunction: none  Conservative measures:  Physical therapy: has participated in PT at [facility].  Multimodal medical therapy including regular antiinflammatories: Tylenol , Gabapentin, prednisone   Injections: 01/08/2024: Left S1 transforaminal ESI (50% relief, dexamethasone  7 mg)  08/15/2023: RFA to the left L4-5 and L5-S1 facet joints (no relief)  05/24/2023: Kenalog injection performed by PCP 09/21/2021: RFA to the left L4-5 and L5-S1 facet joints (6 months of good relief) 08/17/2021: RFA to the right L4-5 and L5-S1 facet joints (6 months of good relief ) 03/11/2021: MBB to the bilateral L4-5 and L5-S1 facet joints (7/10 to 0/10) 02/25/2021: MBB to the bilateral L4-5 and L5-S1 facet joints (8/10 to 0/10) 01/13/2021: Bilateral S1 transforaminal ESI (dexamethasone  8 mg, mild relief) 12/07/2020: Right L3-4 and left L4-5  transforaminal ESI (good relief x1 week, dexamethasone  10 mg)   Past Surgery: none  Stacey Pierce has no symptoms of cervical myelopathy.  The symptoms are causing a significant impact on the patient's life.   I have utilized the care everywhere function in epic to review the outside records available from external health systems.  Review of Systems:  A 10 point review of systems is negative, except for the pertinent positives and negatives detailed in the HPI.  Past Medical History: Past Medical History:  Diagnosis Date   Anemia    Aortic stenosis, severe    a. 09/2017: s/p TAVR   CKD (chronic Pierce disease)    Diabetes mellitus, insulin  dependent (IDDM), uncontrolled    DR (diabetic retinopathy) (HCC)    GERD (gastroesophageal reflux disease)    Glaucoma    Hyperlipidemia    Hypertension    Hypothyroidism    Obesity    S/P TAVR (transcatheter aortic valve replacement) 09/04/2017   26 mm Medtronic Evolut Pro transcatheter heart valve placed via percutaneous right transfemoral approach     Past Surgical History: Past Surgical History:  Procedure Laterality Date   CATARACT EXTRACTION     left 2010   CATARACT EXTRACTION W/PHACO Right 10/13/2015   Procedure: CATARACT EXTRACTION PHACO AND INTRAOCULAR LENS PLACEMENT (IOC);  Surgeon: Stacey Kidney, MD;  Location: [facility];  Service: Ophthalmology;  Laterality: Right;  DIABETIC - insulin  and oral meds Stacey Pierce   CHOLECYSTECTOMY     1967   COLONOSCOPY WITH PROPOFOL  N/A 05/16/2018   Procedure: COLONOSCOPY WITH PROPOFOL ;  Surgeon: Selena Daily, MD;  Location: [facility];  Service: Gastroenterology;  Laterality: N/A;   DILATION AND CURETTAGE OF  UTERUS     ESOPHAGOGASTRODUODENOSCOPY (EGD) WITH PROPOFOL  N/A 05/16/2018   Procedure: ESOPHAGOGASTRODUODENOSCOPY (EGD) WITH PROPOFOL ;  Surgeon: Selena Daily, MD;  Location: [facility] ENDOSCOPY;  Service: Gastroenterology;  Laterality: N/A;   EYE  SURGERY     LAPAROSCOPIC TUBAL LIGATION     RIGHT/LEFT HEART CATH AND CORONARY ANGIOGRAPHY N/A 08/02/2017   Procedure: RIGHT/LEFT HEART CATH AND CORONARY ANGIOGRAPHY;  Surgeon: Michelle Aid, MD;  Location: [facility] INVASIVE CV LAB;  Service: Cardiovascular;  Laterality: N/A;   TEE WITHOUT CARDIOVERSION N/A 09/04/2017   Procedure: TRANSESOPHAGEAL ECHOCARDIOGRAM (TEE);  Surgeon: Arnoldo Lapping, MD;  Location: [facility];  Service: Open Heart Surgery;  Laterality: N/A;   TONSILLECTOMY     1974   TRANSCATHETER AORTIC VALVE REPLACEMENT, TRANSFEMORAL N/A 09/04/2017   Procedure: TRANSCATHETER AORTIC VALVE REPLACEMENT, TRANSFEMORAL;  Surgeon: Arnoldo Lapping, MD;  Location: [facility] OR;  Service: Open Heart Surgery;  Laterality: N/A;   TUBAL LIGATION      Allergies: Allergies as of 05/22/2024 - Review Complete 05/22/2024  Allergen Reaction Noted   Hydrocodone  bit-homatrop mbr  02/11/2018   Azithromycin Rash 02/17/2014   Codeine Other (See Comments) 05/10/2015   Erythromycin Rash 08/26/2015   Penicillins Rash 05/10/2015   Sulfa antibiotics Other (See Comments) 05/10/2015    Medications:  Current Outpatient Medications:    acetaminophen  (TYLENOL ) 500 MG tablet, Take 1,000 mg by mouth every 6 (six) hours as needed for mild pain., Disp: , Rfl:    amitriptyline  (ELAVIL ) 10 MG tablet, Take 10 mg by mouth at bedtime., Disp: , Rfl:    aspirin  EC 81 MG tablet, Take 81 mg by mouth daily., Disp: , Rfl:    atorvastatin  (LIPITOR) 40 MG tablet, TAKE 1 TABLET BY MOUTH EVERY DAY, Disp: 30 tablet, Rfl: 0   BD INSULIN  SYRINGE U/F 31G X 5/16 1 ML MISC, 4 (four) times daily., Disp: , Rfl: 99   clindamycin  (CLEOCIN ) 300 MG capsule, Take 2 capsules (600 mg) one hour prior to all dental visits., Disp: 4 capsule, Rfl: 6   DiphenhydrAMINE  HCl ([employer] ALLERGY PO), Take 1 tablet by mouth daily., Disp: , Rfl:    dorzolamide -timolol  (COSOPT ) 22.3-6.8 MG/ML ophthalmic solution, Place 1 drop into both eyes 2 (two) times daily., Disp:  , Rfl: 5   glucose blood test strip, Use three test strips daily as instructed., Disp: , Rfl:    albuterol  (VENTOLIN  HFA) 108 (90 Base) MCG/ACT inhaler, Inhale 2 puffs into the lungs every 6 (six) hours as needed for wheezing or shortness of breath. (Patient not taking: Reported on 05/22/2024), Disp: 8 g, Rfl: 0   carvedilol  (COREG ) 25 MG tablet, TAKE 1 TABLET BY MOUTH 2 TIMES DAILY WITH A MEAL. (Patient not taking: Reported on 05/22/2024), Disp: 180 tablet, Rfl: 0   cephALEXin  (KEFLEX ) 500 MG capsule, Take 1 capsule (500 mg total) by mouth 2 (two) times daily. (Patient not taking: Reported on 05/22/2024), Disp: 14 capsule, Rfl: 0   doxycycline  (VIBRA -TABS) 100 MG tablet, Take 1 tablet (100 mg total) by mouth 2 (two) times daily. (Patient not taking: Reported on 05/20/2024), Disp: 10 tablet, Rfl: 0   doxycycline  (VIBRA -TABS) 100 MG tablet, Take 1 tablet (100 mg total) by mouth 2 (two) times daily. (Patient not taking: Reported on 05/20/2024), Disp: 14 tablet, Rfl: 0   gabapentin (NEURONTIN) 300 MG capsule, Take 2 capsules by mouth. , Disp: , Rfl:    hydrALAZINE  (APRESOLINE ) 25 MG tablet, Take 1 tablet (25 mg total) by mouth 2 times daily at 12  noon and 4 pm. (Patient not taking: Reported on 05/20/2024), Disp: 60 tablet, Rfl: 12   hydrocortisone  2.5 % cream, Apply topically 2 (two) times daily as needed (Rash). Apply prn BID to aa, Disp: 28 g, Rfl: 3   insulin  aspart (NOVOLOG ) 100 UNIT/ML injection, Inject 20-25 Units into the skin 2 (two) times daily before a meal. Takes 20 units before breakfast and 25 units before dinner., Disp: , Rfl:    insulin  detemir (LEVEMIR ) 100 UNIT/ML injection, Inject 20 units into the skin in the morning and 55 units at bedtime, Disp: , Rfl:    latanoprost  (XALATAN ) 0.005 % ophthalmic solution, Place 1 drop into both eyes at bedtime. , Disp: , Rfl:    levothyroxine  (SYNTHROID ) 112 MCG tablet, Take 1 tablet (112 mcg total) by mouth daily before breakfast. Please schedule office  visit before any future refill., Disp: 30 tablet, Rfl: 0   losartan  (COZAAR ) 100 MG tablet, TAKE 1 TABLET BY MOUTH EVERY DAY, Disp: 90 tablet, Rfl: 0   metFORMIN (GLUCOPHAGE) 1000 MG tablet, Take 2,000 mg by mouth daily after supper. (Patient not taking: Reported on 05/20/2024), Disp: , Rfl:    pantoprazole  (PROTONIX ) 40 MG tablet, TAKE 1 TABLET (40 MG TOTAL) BY MOUTH DAILY AT 12 NOON., Disp: 90 tablet, Rfl: 3   predniSONE  (DELTASONE ) 20 MG tablet, Take 1 tablet (20 mg total) by mouth daily with breakfast. (Patient not taking: Reported on 05/20/2024), Disp: 5 tablet, Rfl: 1   torsemide (DEMADEX) 10 MG tablet, Take 10 mg by mouth daily. (Patient not taking: Reported on 05/20/2024), Disp: , Rfl:   Social History: Social History   Tobacco Use   Smoking status: Never   Smokeless tobacco: Never  Vaping Use   Vaping status: Never Used  Substance Use Topics   Alcohol use: No    Alcohol/week: 0.0 standard drinks of alcohol   Drug use: Never    Family Medical History: Family History  Problem Relation Age of Onset   Alzheimer's disease Mother    Colon polyps Mother    Hypertension Mother    Heart attack Father    Coronary artery disease Father    Diabetes Father    Breast cancer Sister    Colon polyps Sister    Diabetes Sister    Diabetes Sister    Diabetes Sister    Diabetes Sister     Physical Examination: Vitals:   05/22/24 1403  BP: 128/68    General: Patient is in no apparent distress. Attention to examination is appropriate.  Neck:   Supple.  Full range of motion.  Respiratory: Patient is breathing without any difficulty.   NEUROLOGICAL:     Awake, alert, oriented to person, place, and time.  Speech is clear and fluent.   Cranial Nerves: Pupils equal round and reactive to light.  Facial tone is symmetric.  Facial sensation is symmetric. Shoulder shrug is symmetric. Tongue protrusion is midline.  There is no pronator drift.  Strength: Side Biceps Triceps Deltoid  Interossei Grip Wrist Ext. Wrist Flex.  R 5 5 5 5 5 5 5   L 5 5 5 5 5 5 5    Side Iliopsoas Quads Hamstring PF DF EHL  R 5 5 5 5 5 5   L 5 5 5 5 5 5    Reflexes are 1+ and symmetric at the biceps, triceps, brachioradialis, patella and achilles.   Hoffman's is absent.   Bilateral upper and lower extremity sensation is intact to light touch.  No evidence of dysmetria noted.  Gait is normal.     Medical Decision Making  Imaging: MRI L spine 04/15/2024 IMPRESSION: No acute osseous abnormality and largely stable lumbar spine degeneration from MRI last year:   - stable up to Severe multifactorial spinal and lateral recess stenosis at L4-L5, Moderate at L3-L4, and mild at L2-L3.   - moderate to severe left L3 and right L4 nerve level foraminal stenoses appears somewhat progressed.   - other notable neural foraminal stenosis is moderate left L3 and moderate to severe bilateral L5 nerve levels.     Electronically Signed   By: Marlise Simpers M.D.   On: 05/13/2024 07:32    I have personally reviewed the images and agree with the above interpretation.  Assessment and Plan: Ms. Ogan is a pleasant 83 y.o. female with neurogenic claudication.  She has tried and failed conservative management.  I recommended L3-5 lumbar decompression.  There is no role for further conservative management.  I discussed the planned procedure at length with the patient, including the risks, benefits, alternatives, and indications. The risks discussed include but are not limited to bleeding, infection, need for reoperation, spinal fluid leak, stroke, vision loss, anesthetic complication, coma, paralysis, and even death. I also described in detail that improvement was not guaranteed.  The patient expressed understanding of these risks, and asked that we proceed with surgery. I described the surgery in layman's terms, and gave ample opportunity for questions, which were answered to the best of my ability.  I  spent a total of 30 minutes in this patient's care today. This time was spent reviewing pertinent records including imaging studies, obtaining and confirming history, performing a directed evaluation, formulating and discussing my recommendations, and documenting the visit within the medical record.       Thank you for involving me in the care of this patient.      Kalab Camps K. Mont Antis MD, [facility] Neurosurgery

## 2024-05-20 ENCOUNTER — Encounter: Payer: Self-pay | Admitting: Dermatology

## 2024-05-20 ENCOUNTER — Ambulatory Visit: Admitting: Dermatology

## 2024-05-20 DIAGNOSIS — L821 Other seborrheic keratosis: Secondary | ICD-10-CM

## 2024-05-20 DIAGNOSIS — L570 Actinic keratosis: Secondary | ICD-10-CM

## 2024-05-20 DIAGNOSIS — L820 Inflamed seborrheic keratosis: Secondary | ICD-10-CM | POA: Diagnosis not present

## 2024-05-20 DIAGNOSIS — W908XXA Exposure to other nonionizing radiation, initial encounter: Secondary | ICD-10-CM | POA: Diagnosis not present

## 2024-05-20 NOTE — Patient Instructions (Signed)

## 2024-05-20 NOTE — Progress Notes (Signed)
   Follow-Up Visit   Subjective  Stacey Pierce is a 83 y.o. female who presents for the following: Pt has lesions scattered on the face, arms, and back that she would like checked today. Some have been treated with LN2 before.   The following portions of the chart were reviewed this encounter and updated as appropriate: medications, allergies, medical history  Review of Systems:  No other skin or systemic complaints except as noted in HPI or Assessment and Plan.  Objective  Well appearing patient in no apparent distress; mood and affect are within normal limits.   A focused examination was performed of the following areas: the face, arms, legs, and back   Relevant exam findings are noted in the Assessment and Plan.  R med prox forearm x 1, R cheek x 4, L forehead x 2 (7) Erythematous stuck-on, waxy papule or plaque R cheek x 2 (2) Erythematous thin papules/macules with gritty scale.   Assessment & Plan   SEBORRHEIC KERATOSIS - Stuck-on, waxy, tan-brown papules and/or plaques  - Benign-appearing - Discussed benign etiology and prognosis. - Observe - Call for any changes INFLAMED SEBORRHEIC KERATOSIS (7) R med prox forearm x 1, R cheek x 4, L forehead x 2 (7) Symptomatic, irritating, patient would like treated.  Destruction of lesion - R med prox forearm x 1, R cheek x 4, L forehead x 2 (7) Complexity: simple   Destruction method: cryotherapy   Informed consent: discussed and consent obtained   Timeout:  patient name, date of birth, surgical site, and procedure verified Lesion destroyed using liquid nitrogen: Yes   Region frozen until ice ball extended beyond lesion: Yes   Outcome: patient tolerated procedure well with no complications   Post-procedure details: wound care instructions given   AK (ACTINIC KERATOSIS) (2) R cheek x 2 (2) Actinic keratoses are precancerous spots that appear secondary to cumulative UV radiation exposure/sun exposure over time. They are  chronic with expected duration over 1 year. A portion of actinic keratoses will progress to squamous cell carcinoma of the skin. It is not possible to reliably predict which spots will progress to skin cancer and so treatment is recommended to prevent development of skin cancer.  Recommend daily broad spectrum sunscreen SPF 30+ to sun-exposed areas, reapply every 2 hours as needed.  Recommend staying in the shade or wearing long sleeves, sun glasses (UVA+UVB protection) and wide brim hats (4-inch brim around the entire circumference of the hat). Call for new or changing lesions.  Destruction of lesion - R cheek x 2 (2) Complexity: simple   Destruction method: cryotherapy   Informed consent: discussed and consent obtained   Timeout:  patient name, date of birth, surgical site, and procedure verified Lesion destroyed using liquid nitrogen: Yes   Region frozen until ice ball extended beyond lesion: Yes   Outcome: patient tolerated procedure well with no complications   Post-procedure details: wound care instructions given    Return in 6 months (on 11/19/2024) for ISK/AK recheck.  Arlinda Lais, CMA, am acting as scribe for Harris Liming, MD .   Documentation: I have reviewed the above documentation for accuracy and completeness, and I agree with the above.  Harris Liming, MD

## 2024-05-22 ENCOUNTER — Encounter: Payer: Self-pay | Admitting: Neurosurgery

## 2024-05-22 ENCOUNTER — Ambulatory Visit: Admitting: Neurosurgery

## 2024-05-22 ENCOUNTER — Other Ambulatory Visit: Payer: Self-pay

## 2024-05-22 VITALS — BP 128/68

## 2024-05-22 DIAGNOSIS — Z01818 Encounter for other preprocedural examination: Secondary | ICD-10-CM

## 2024-05-22 DIAGNOSIS — M48062 Spinal stenosis, lumbar region with neurogenic claudication: Secondary | ICD-10-CM | POA: Diagnosis not present

## 2024-05-22 NOTE — Patient Instructions (Signed)
 Please see below for information in regards to your upcoming surgery:   Planned surgery: L3-5 decompression   Surgery date: 06/27/24 at [facility]: [address] [place], [place] [address]) - you will find out your arrival time the business day before your surgery.   Pre-op appointment at [facility] Pre-admit Testing: you will receive a call with a date/time for this appointment. If you are scheduled for an in person appointment, Pre-admit Testing is located on the first floor of the [facility] building, [address], [address]. During this appointment, they will advise you which medications you can take the morning of surgery, and which medications you will need to hold for surgery. Labs (such as blood work, EKG) may be done at your pre-op appointment. You are not required to fast for these labs. Should you need to change your pre-op appointment, please call Pre-admit testing at [phone].     Blood thinners:    Aspirin  81mg :  OK to stay on aspirin  81mg     Surgical clearance: we will send a clearance form to Dr Primus Brookes (Primary care) and Dr Braxton Calico (Cardiology). They may wish to see you in their office prior to signing the clearance form. If so, they may call you to schedule an appointment.      Common restrictions after surgery: No bending, lifting, or twisting ("BLT"). Avoid lifting objects heavier than 10 pounds for the first 6 weeks after surgery. Where possible, avoid household activities that involve lifting, bending, reaching, pushing, or pulling such as laundry, vacuuming, grocery shopping, and childcare. Try to arrange for help from friends and family for these activities while you heal. Do not drive while taking prescription pain medication. Weeks 6 through 12 after surgery: avoid lifting more than 25 pounds.     How to contact us :  If you have any questions/concerns before or after surgery, you can reach us  at  [phone], or you can send a mychart message. We can be reached by phone or mychart 8am-4pm, Monday-Friday.  *Please note: Calls after 4pm are forwarded to a third party answering service. Mychart messages are not routinely monitored during evenings, weekends, and holidays. Please call our office to contact the answering service for urgent concerns during non-business hours.    If you have FMLA/disability paperwork, please drop it off or fax it to [phone], attention Patty.   Appointments/FMLA & disability paperwork: Gerlean Kocher, & Maryann Smalls Registered Nurses/Surgery schedulers: Eulalio Reamy & Lauren Medical Assistants: Donnajean Fuse Physician Assistants: Ludwig Safer, PA-C, Anastacio Karvonen, PA-C & Lucetta Russel, PA-C Surgeons: Jodeen Munch, MD & Henderson Lock, MD   [facility] PREADMIT TESTING VISIT and SURGERY INFORMATION SHEET   Now that surgery has been scheduled you can anticipate several phone calls from [facility] services. A pharmacy technician will call you to verify your current list of medications taken at home.               The Pre-Service Center will call to verify your insurance information and to give you billing estimates and information.             The Preadmit Testing Office will be calling to schedule a visit to obtain information for the anesthesia team and provide instructions on preparation for surgery.  What can you expect for the Preadmit Testing Visit: Appointments may be scheduled in-person or by telephone.  If a telephone visit is scheduled, you may be asked to come into the office  to have lab tests or other studies performed.   This visit will not be completed any greater than 14 days prior to your surgery.  If your surgery has been scheduled for a future date, please do not be alarmed if we have not contacted you to schedule an appointment more than a month prior to the surgery date.    Please be prepared to provide the following  information during this appointment:            -Personal medical history                                               -Medication and allergy list            -Any history of problems with anesthesia              -Recent lab work or diagnostic studies            -Please notify us  of any needs we should be aware of to provide the best care possible           -You will be provided with instructions on how to prepare for your surgery.    On The Day of Surgery:  You must have a driver to take you home after surgery, you will be asked not to drive for 24 hours following surgery.  Taxi, [employer] and non-medical transport will not be acceptable means of transportation unless you have a responsible individual who will be traveling with you.  Visitors in the surgical area:   2 people will be able to visit you in your room once your preparation for surgery has been completed. During surgery, your visitors will be asked to wait in the Surgery Waiting Area.  It is not a requirement for them to stay, if they prefer to leave and come back.  Your visitor(s) will be given an update once the surgery has been completed.  No visitors are allowed in the initial recovery room to respect patient privacy and safety.  Once you are more awake and transfer to the secondary recovery area, or are transferred to an inpatient room, visitors will again be able to see you.  To respect and protect your privacy: We will ask on the day of surgery who your driver will be and what the contact number for that individual will be. We will ask if it is okay to share information with this individual, or if there is an alternative individual that we, or the surgeon, should contact to provide updates and information. If family or friends come to the surgical information desk requesting information about you, who you have not listed with us , no information will be given.   It may be helpful to designate someone as the main contact who will  be responsible for updating your other friends and family.    PREADMIT TESTING OFFICE: [phone] SAME DAY SURGERY: [phone] We look forward to caring for you before and throughout the process of your surgery.

## 2024-05-29 ENCOUNTER — Ambulatory Visit: Payer: PPO | Admitting: Dermatology

## 2024-06-11 DIAGNOSIS — E782 Mixed hyperlipidemia: Secondary | ICD-10-CM | POA: Diagnosis not present

## 2024-06-11 DIAGNOSIS — Z01818 Encounter for other preprocedural examination: Secondary | ICD-10-CM | POA: Diagnosis not present

## 2024-06-11 DIAGNOSIS — I2510 Atherosclerotic heart disease of native coronary artery without angina pectoris: Secondary | ICD-10-CM | POA: Diagnosis not present

## 2024-06-11 DIAGNOSIS — I10 Essential (primary) hypertension: Secondary | ICD-10-CM | POA: Diagnosis not present

## 2024-06-11 DIAGNOSIS — Z952 Presence of prosthetic heart valve: Secondary | ICD-10-CM | POA: Diagnosis not present

## 2024-06-13 ENCOUNTER — Telehealth: Payer: Self-pay

## 2024-06-13 NOTE — Telephone Encounter (Signed)
 Surgery clearance appt with Dr Wilburn completeed 06/11/24. Per her note, Check echocardiogram to follow-up on TAVR valve function Regarding preop risk stratification, if echo is stable with no significant valve dysfunction, she will be at elevated but acceptable, intermediate risk for back surgery. Optimized from cardiac standpoint.   It appears her echo is scheduled for 08/19/24. Her surgery is scheduled for 06/27/24.  Left message for Mrs Steinhilber to return my call to discuss. If they cannot do her echo until September, we will likely need to postpone her surgery.

## 2024-06-16 ENCOUNTER — Encounter
Admission: RE | Admit: 2024-06-16 | Discharge: 2024-06-16 | Disposition: A | Discharge status: Home / Self Care | Source: Ambulatory Visit | Attending: Neurosurgery | Admitting: Neurosurgery

## 2024-06-16 ENCOUNTER — Other Ambulatory Visit: Payer: Self-pay

## 2024-06-16 VITALS — Ht 65.0 in | Wt 198.0 lb

## 2024-06-16 DIAGNOSIS — E1121 Type 2 diabetes mellitus with diabetic nephropathy: Secondary | ICD-10-CM | POA: Insufficient documentation

## 2024-06-16 DIAGNOSIS — Z01812 Encounter for preprocedural laboratory examination: Secondary | ICD-10-CM

## 2024-06-16 DIAGNOSIS — Z01810 Encounter for preprocedural cardiovascular examination: Secondary | ICD-10-CM

## 2024-06-16 DIAGNOSIS — I10 Essential (primary) hypertension: Secondary | ICD-10-CM

## 2024-06-16 DIAGNOSIS — Z952 Presence of prosthetic heart valve: Secondary | ICD-10-CM

## 2024-06-16 DIAGNOSIS — Z01818 Encounter for other preprocedural examination: Secondary | ICD-10-CM | POA: Insufficient documentation

## 2024-06-16 LAB — BASIC METABOLIC PANEL WITH GFR
Anion gap: 9 (ref 5–15)
BUN: 31 mg/dL — ABNORMAL HIGH (ref 8–23)
CO2: 23 mmol/L (ref 22–32)
Calcium: 9.8 mg/dL (ref 8.9–10.3)
Chloride: 110 mmol/L (ref 98–111)
Creatinine, Ser: 1.71 mg/dL — ABNORMAL HIGH (ref 0.44–1.00)
GFR, Estimated: 29 mL/min — ABNORMAL LOW (ref >60)
Glucose, Bld: 158 mg/dL — ABNORMAL HIGH (ref 70–99)
Potassium: 4.1 mmol/L (ref 3.5–5.1)
Sodium: 142 mmol/L (ref 135–145)

## 2024-06-16 LAB — TYPE AND SCREEN
ABO/RH(D): O POS
Antibody Screen: NEGATIVE

## 2024-06-16 LAB — CBC
HCT: 33.6 % — ABNORMAL LOW (ref 36.0–46.0)
Hemoglobin: 10.7 g/dL — ABNORMAL LOW (ref 12.0–15.0)
MCH: 28.5 pg (ref 26.0–34.0)
MCHC: 31.8 g/dL (ref 30.0–36.0)
MCV: 89.4 fL (ref 80.0–100.0)
Platelets: 225 K/uL (ref 150–400)
RBC: 3.76 MIL/uL — ABNORMAL LOW (ref 3.87–5.11)
RDW: 12.9 % (ref 11.5–15.5)
WBC: 8.2 K/uL (ref 4.0–10.5)
nRBC: 0 % (ref 0.0–0.2)

## 2024-06-16 LAB — SURGICAL PCR SCREEN
MRSA, PCR: NEGATIVE
Staphylococcus aureus: NEGATIVE

## 2024-06-16 NOTE — Telephone Encounter (Signed)
 I spoke with the patient. She reports she intentionally booked her echo for September because she wanted to wait until after surgery. Based on Dr Alluri's note, it appears she needs the echo to have surgery. She will call their office.

## 2024-06-16 NOTE — Patient Instructions (Addendum)
 Your procedure is scheduled on:  FRIDAY JULY 25  Report to the Registration Desk on the 1st floor of the [facility] To find out your arrival time, please call [phone] between 1PM - 3PM on:  THURSDAY JULY 24  If your arrival time is 6:00 am, do not arrive before that time as the [facility] entrance doors do not open until 6:00 am.  REMEMBER: Instructions that are not followed completely may result in serious medical risk, up to and including death; or upon the discretion of your surgeon and anesthesiologist your surgery may need to be rescheduled.  Do not eat food after midnight the night before surgery.  No gum chewing or hard candies.  You may however, drink WATER  up to 2 hours before you are scheduled to arrive for your surgery. Do not drink anything within 2 hours of your scheduled arrival time.  One week prior to surgery: Stop Anti-inflammatories (NSAIDS) such as Advil, Aleve, Ibuprofen, Motrin, Naproxen, Naprosyn and Aspirin  based products such as Excedrin, Goody's Powder, BC Powder. Stop ANY OVER THE COUNTER supplements until after surgery. Vitamin D, Ergocalciferol, (DRISDOL)  DiphenhydrAMINE  HCl ([facility] ALLERGY PO)  calcitRIOL (ROCALTROL)   You may however, continue to take Tylenol  if needed for pain up until the day of surgery.  **Follow guidelines for insulin  and diabetes medications.** YOU ARE TO CONTACT YOUR ENDOCRINOLOGIST DR. ANNA SOLUM [phone] TODAY  REGARDING YOUR DIABETIC REGIMEN FOR SURGERY   **Follow recommendations regarding stopping blood thinners.** aspirin  EC okay to continue per Dr. Clois  Continue taking all of your other prescription medications up until the day of surgery.  ON THE DAY OF SURGERY ONLY TAKE THESE MEDICATIONS WITH SIPS OF WATER:  metoprolol  succinate (TOPROL -XL)  pantoprazole  (PROTONIX )  levothyroxine  (SYNTHROID )   No Alcohol for 24 hours before or after surgery.  Do not use any recreational drugs for at least a  week (preferably 2 weeks) before your surgery.  Please be advised that the combination of cocaine and anesthesia may have negative outcomes, up to and including death. If you test positive for cocaine, your surgery will be cancelled.  On the morning of surgery brush your teeth with toothpaste and water, you may rinse your mouth with mouthwash if you wish. Do not swallow any toothpaste or mouthwash.  Use CHG Soap as directed on instruction sheet.  Do not wear jewelry, make-up, hairpins, clips or nail polish.  For welded (permanent) jewelry: bracelets, anklets, waist bands, etc.  Please have this removed prior to surgery.  If it is not removed, there is a chance that hospital personnel will need to cut it off on the day of surgery.  Do not wear lotions, powders, or perfumes.   Do not shave body hair from the neck down 48 hours before surgery.  Do not bring valuables to the hospital. [facility] is not responsible for any missing/lost belongings or valuables.    Notify your doctor if there is any change in your medical condition (cold, fever, infection).  Wear comfortable clothing (specific to your surgery type) to the hospital.  After surgery, you can help prevent lung complications by doing breathing exercises.  Take deep breaths and cough every 1-2 hours.  If you are being admitted to the hospital overnight, leave your suitcase in the car. After surgery it may be brought to your room.  In case of increased patient census, it may be necessary for you, the patient, to continue your postoperative care in the Same Day  Surgery department.  If you are being discharged the day of surgery, you will not be allowed to drive home. You will need a responsible individual to drive you home and stay with you for 24 hours after surgery.   If you are taking public transportation, you will need to have a responsible individual with you.  Please call the Pre-admissions Testing Dept. at [phone]  if you have any questions about these instructions.  Surgery Visitation Policy:  Patients having surgery or a procedure may have two visitors.  Children under the age of 51 must have an adult with them who is not the patient.  Inpatient Visitation:    Visiting hours are 7 a.m. to 8 p.m. Up to four visitors are allowed at one time in a patient room. The visitors may rotate out with other people during the day.  One visitor age 103 or older may stay with the patient overnight and must be in the room by 8 p.m.   Merchandiser, retail to address health-related social needs:  [email or web address]     Pre-operative 5 CHG Bath Instructions   You can play a key role in reducing the risk of infection after surgery. Your skin needs to be as free of germs as possible. You can reduce the number of germs on your skin by washing with CHG (chlorhexidine  gluconate) soap before surgery. CHG is an antiseptic soap that kills germs and continues to kill germs even after washing.   DO NOT use if you have an allergy to chlorhexidine /CHG or antibacterial soaps. If your skin becomes reddened or irritated, stop using the CHG and notify one of our RNs at [phone].   Please shower with the CHG soap starting 4 days before surgery using the following schedule:   STARTING MONDAY JULY 21    Please keep in mind the following:  DO NOT shave, including legs and underarms, starting the day of your first shower.   You may shave your face at any point before/day of surgery.  Place clean sheets on your bed the day you start using CHG soap. Use a clean washcloth (not used since being washed) for each shower. DO NOT sleep with pets once you start using the CHG.   CHG Shower Instructions:  If you choose to wash your hair and private area, wash first with your normal shampoo/soap.  After you use shampoo/soap, rinse your hair and body thoroughly to remove shampoo/soap residue.  Turn the water OFF  and apply about 3 tablespoons (45 ml) of CHG soap to a CLEAN washcloth.  Apply CHG soap ONLY FROM YOUR NECK DOWN TO YOUR TOES (washing for 3-5 minutes)  DO NOT use CHG soap on face, private areas, open wounds, or sores.  Pay special attention to the area where your surgery is being performed.  If you are having back surgery, having someone wash your back for you may be helpful. Wait 2 minutes after CHG soap is applied, then you may rinse off the CHG soap.  Pat dry with a clean towel  Put on clean clothes/pajamas   If you choose to wear lotion, please use ONLY the CHG-compatible lotions on the back of this paper.     Additional instructions for the day of surgery: DO NOT APPLY any lotions, deodorants, cologne, or perfumes.   Put on clean/comfortable clothes.  Brush your teeth.  Ask your nurse before applying any prescription medications to the skin.      CHG Compatible Lotions  Aveeno Moisturizing lotion  Cetaphil Moisturizing Cream  Cetaphil Moisturizing Lotion  Clairol Herbal Essence Moisturizing Lotion, Dry Skin  Clairol Herbal Essence Moisturizing Lotion, Extra Dry Skin  Clairol Herbal Essence Moisturizing Lotion, Normal Skin  Curel Age Defying Therapeutic Moisturizing Lotion with Alpha Hydroxy  Curel Extreme Care Body Lotion  Curel Soothing Hands Moisturizing Hand Lotion  Curel Therapeutic Moisturizing Cream, Fragrance-Free  Curel Therapeutic Moisturizing Lotion, Fragrance-Free  Curel Therapeutic Moisturizing Lotion, Original Formula  Eucerin Daily Replenishing Lotion  Eucerin Dry Skin Therapy Plus Alpha Hydroxy Crme  Eucerin Dry Skin Therapy Plus Alpha Hydroxy Lotion  Eucerin Original Crme  Eucerin Original Lotion  Eucerin Plus Crme Eucerin Plus Lotion  Eucerin TriLipid Replenishing Lotion  Keri Anti-Bacterial Hand Lotion  Keri Deep Conditioning Original Lotion Dry Skin Formula Softly Scented  Keri Deep Conditioning Original Lotion, Fragrance Free Sensitive Skin  Formula  Keri Lotion Fast Absorbing Fragrance Free Sensitive Skin Formula  Keri Lotion Fast Absorbing Softly Scented Dry Skin Formula  Keri Original Lotion  Keri Skin Renewal Lotion Keri Silky Smooth Lotion  Keri Silky Smooth Sensitive Skin Lotion  Nivea Body Creamy Conditioning Oil  Nivea Body Extra Enriched Teacher, adult education Moisturizing Lotion Nivea Crme  Nivea Skin Firming Lotion  NutraDerm 30 Skin Lotion  NutraDerm Skin Lotion  NutraDerm Therapeutic Skin Cream  NutraDerm Therapeutic Skin Lotion  ProShield Protective Hand Cream  Provon moisturizing lotion

## 2024-06-16 NOTE — Telephone Encounter (Signed)
 The patient called back and informed our office that her echo has been moved up to 06/18/24.

## 2024-06-18 DIAGNOSIS — Z952 Presence of prosthetic heart valve: Secondary | ICD-10-CM | POA: Diagnosis not present

## 2024-06-18 DIAGNOSIS — I2510 Atherosclerotic heart disease of native coronary artery without angina pectoris: Secondary | ICD-10-CM | POA: Diagnosis not present

## 2024-06-19 ENCOUNTER — Telehealth: Payer: Self-pay

## 2024-06-19 NOTE — Telephone Encounter (Signed)
 I spoke with the patient. She completed the echo yesterday. She has not been informed of the results yet.

## 2024-06-19 NOTE — Telephone Encounter (Addendum)
 She is scheduled for L3-5 DECOMPRESSION on 06/27/24 with Dr Clois  I spoke with the patient. She asked about removing her nail polish, stopping her fiber powder prior to surgery, and staying over night after surgery.  I informed her that her instructions from pre-admit testing state she should remove her nail polish and stop all supplements 1 week prior to surgery. She is already scheduled to stay overnight after surgery.  I encouraged her to call back with any additional questions.

## 2024-06-19 NOTE — Telephone Encounter (Signed)
 Patient states she has an upcoming surgery with Dr.Smith and wanted to ask the nurse a few questions regarding that. Please give patient a call.

## 2024-06-23 NOTE — Telephone Encounter (Signed)
 Still have not received signed clearance form. Called [facility] to follow up; informed them that surgery is this Friday. I was told to refax the form to [facility]. Form has been faxed again.

## 2024-06-24 ENCOUNTER — Encounter: Payer: Self-pay | Admitting: Neurosurgery

## 2024-06-24 NOTE — Progress Notes (Signed)
 Perioperative / Anesthesia Services  Pre-Admission Testing Clinical Review / Pre-Operative Anesthesia Consult  Date: 06/24/24  PATIENT DEMOGRAPHICS: Name: Stacey Pierce DOB: 06/20/1941 MRN:   [id number]  Note: Available PAT nursing documentation and vital signs have been reviewed. Clinical nursing staff has updated patient's PMH/PSHx, current medication list, and drug allergies/intolerances to ensure complete and comprehensive history available to assist care teams in MDM as it pertains to the aforementioned surgical procedure and anticipated anesthetic course. Extensive review of available clinical information personally performed. [place] PMH and PSHx updated with any diagnoses/procedures that  may have been inadvertently omitted during his intake with the pre-admission testing department's nursing staff.  PLANNED SURGICAL PROCEDURE(S):   Case: [id number] Date/Time: 06/27/24 0947   Procedure: LUMBAR LAMINECTOMY/DECOMPRESSION MICRODISCECTOMY 2 LEVELS - MIS (MINIMALLY INVASIVE) L3-5 LUMBAR DECOMPRESSION   Anesthesia type: General   Diagnosis: Neurogenic claudication due to lumbar spinal stenosis [M48.062]   Pre-op diagnosis: M48.062 Neurogenic claudication due to lumbar spinal stenosis   Location: [facility] OR ROOM 03 / [facility]   Surgeons: Clois Fret, MD        CLINICAL DISCUSSION: Stacey Pierce is a 83 y.o. female who is submitted for pre-surgical anesthesia review and clearance prior to her undergoing the above procedure. Patient has never been a smoker in the past. Pertinent PMH includes: CAD, severe aortic stenosis (s/p TAVR), carotid artery disease, cardiac murmur, PVD, aortic atherosclerosis, HTN, HLD, T2DM, hypothyroidism, secondary hyperparathyroidism of renal origin, CKD-III, GERD (on daily PPI), IDA, glaucoma, lumbar spinal stenosis with neurogenic claudication.  Patient is followed by cardiology Juanice, MD). She was last seen in the cardiology  clinic on 06/11/2024; notes reviewed. At the time of her clinic visit, patient doing well overall from a cardiovascular perspective. Patient denied any chest pain, shortness of breath, PND, orthopnea, palpitations, significant peripheral edema, weakness, fatigue, vertiginous symptoms, or presyncope/syncope. Patient with a past medical history significant for cardiovascular diagnoses. Documented physical exam was grossly benign, providing no evidence of acute exacerbation and/or decompensation of the patient's known cardiovascular conditions.  Patient underwent diagnostic RIGHT/LEFT heart catheterization on 01/21/2015 to further evaluate progressive angina and shortness of breath in the setting of known aortic valve stenosis.  Revealing multivessel CAD; 20% proximal LAD, 70% OM2, and 25% proximal RCA.  Hemodynamics: mean PA = 25 mmHg, mean PCWP = 17 mmHg, mean RA = 9 mmHg, AO saturation = 95%, PA saturation = 72%, CO = 7.20 L/min, CI 3.68 L/min/m.  Given the progressive moderate to severe aortic stenosis, patient was referred to TAVR team for further consideration of valve replacement.  Coronary CTA was performed on 06/03/2015 revealing a bicuspid aortic valve with normal size of the aortic root and ascending aorta.  Sufficient annulus to coronary distance noted.  Left atrium was mildly dilated.  LAA large with no filling defect.  There was moderate concentric LVH.  Mitral annular calcifications observed.  Aortic calcifications noted in the ascending aorta, aortic arch, and ascending aorta.  Coronary calcium  score was not reported.  Repeat diagnostic RIGHT/LEFT heart catheterization was performed on 08/02/2017.  Study again revealed multivessel CAD; 25% proximal RCA, 20% mid RCA, 10% ostial-proximal LCx, and 50% ostial-proximal LAD.  Severe aortic valve stenosis with trivial regurgitation again noted; mean transvalvular pressure gradient of 32 mmHg; peak gradient 58 mmHg.  Ejection fraction normal at 60% with  moderate LVH.  Hemodynamics: mean PA = 33 mmHg, mean PCWP = 24 mmHg.  Patient once again referred to the TAVR team for  consideration of aortic valve replacement  Patient ultimately underwent TAVR procedure on 09/04/2017 placing a 26 mm Medtronic CoreValve Evolute Pro (SN: [id number]) via a RIGHT transfemoral approach.  Carotid Doppler study performed on 08/14/2017 revealed a 1-39% stenosis involving the patient's BILATERAL carotid arteries.  Vertebral arteries demonstrated antegrade flow.  There were normal flow hemodynamics noted and the subclavians.  Following patient's TAVR procedure, cardiac function has been serially monitored via echocardiogram. Most recent TTE performed on 06/18/2024 revealed a normal left ventricular systolic function with an EF of >55% %. There was moderate LVH.  There were no regional wall motion abnormalities.  Left ventricular diastolic Doppler parameters were indeterminate.  Left atrium mildly enlarged.  Right ventricular size and function normal with a TAPSE measuring 2.8 cm  (normal range >/= 1.6 cm).  There was severe mitral annular calcification.  There was trivial pulmonary and tricuspid valve regurgitation.  Bioprosthetic aortic valve well-seated and noted to be functioning properly; mean transvalvular pressure gradient = 13 mmHg; AVA (VTI) 1.1 cm.  All remaining transvalvular gradients were noted to be normal providing no evidence of hemodynamically significant valvular stenosis. Aorta normal in size with no evidence of ectasia or aneurysmal dilatation.  Blood pressure well controlled at 120/64 mmHg on currently prescribed ARB (losartan ), beta-blocker (metoprolol  succinate), and diuretic (torsemide) therapies.  Patient is on atorvastatin  for her HLD diagnosis and ASCVD prevention.  T2DM reasonably controlled on current prescribed regimen; last HgbA1c was 7.1% when checked on 04/22/2024.  She does not have an OSAH diagnosis.  Functional capacity limited by age, multiple  medical comorbidities, and back pain.  With that said, patient able to complete all of her ADLs/IADLs without cardiovascular limitation.  Per the DASI, patient questionably able to achieve 4 METS of physical activity without experiencing, at least to some degree, significant angina/anginal: Symptoms.  No changes were made to her medication regimen during her visit with cardiology.  Patient scheduled to follow-up with outpatient cardiology in 6 months or sooner if needed.  Stacey Pierce is scheduled for an elective LUMBAR LAMINECTOMY/DECOMPRESSION MICRODISCECTOMY 2 LEVELS - MIS (MINIMALLY INVASIVE) L3-5 LUMBAR DECOMPRESSION on 06/27/2024 with Dr. Reeves Daisy, MD.  Given patient's past medical history significant for cardiovascular diagnoses, presurgical cardiac clearance was sought by the PAT team. Per cardiology, this patient is optimized for surgery and may proceed with the planned procedural course with a MODERATE risk of significant perioperative cardiovascular complications.   In review of the patient's chart, it is noted that she is on daily oral antithrombotic therapy. Given that patient's past medical history is significant for cardiovascular diagnoses, including but not limited to CAD and aortic valve replacement, neurosurgery has cleared patient to continue her daily low dose ASA throughout her perioperative course.  Patient has been updated on these directives from her specialty care providers by the PAT team.  Patient denies previous perioperative complications with anesthesia in the past. In review her EMR, it is noted that patient underwent a general anesthetic course here at [facility] (ASA III) in 05/2018 without documented complications.   MOST RECENT VITAL SIGNS:    06/16/2024    9:11 AM 05/22/2024    2:03 PM 07/19/2023    1:51 PM  Vitals with BMI  Height 5' 5    Weight 198 lbs    BMI 32.95    Systolic  128 155  Diastolic  68 69  Pulse    75   PROVIDERS/SPECIALISTS: NOTE: Primary physician provider listed below. Patient  may have been seen by APP or partner within same practice.   PROVIDER ROLE / SPECIALTY [facility] Clois Fret, MD Neurosurgery (Surgeon) 05/22/2024  Sherial Bail, MD Primary Care Provider 03/25/2024  Dewane Shiner, DO Cardiology 06/11/2024  Damian Setter, MD Endocrinology 04/29/2024  Avanell Katz, MD Physiatry 04/09/2024   ALLERGIES: Allergies  Allergen Reactions   Hydrocodone  Bit-Homatrop Mbr     Nausea/dizziness   Azithromycin Rash    MYCIN GROUP    Codeine Other (See Comments)    COLIC   Erythromycin Rash   Penicillins Rash    Has tolerated 1st (CEPHALEXIN ) and 2nd (CEFUROXIME ) generation cephalosporins in the past with no documented ADRs.   Sulfa Antibiotics Other (See Comments)    COLIC    CURRENT HOME MEDICATIONS: No current facility-administered medications for this encounter.    acetaminophen  (TYLENOL ) 500 MG tablet   aspirin  EC 81 MG tablet   atorvastatin  (LIPITOR) 40 MG tablet   calcitRIOL (ROCALTROL) 0.25 MCG capsule   DiphenhydrAMINE  HCl ([facility] ALLERGY PO)   dorzolamide -timolol  (COSOPT ) 22.3-6.8 MG/ML ophthalmic solution   gabapentin (NEURONTIN) 300 MG capsule   insulin  aspart (NOVOLOG ) 100 UNIT/ML injection   insulin  detemir (LEVEMIR ) 100 UNIT/ML injection   LANTUS 100 UNIT/ML injection   latanoprost  (XALATAN ) 0.005 % ophthalmic solution   levothyroxine  (SYNTHROID ) 112 MCG tablet   losartan  (COZAAR ) 50 MG tablet   metoprolol  succinate (TOPROL -XL) 25 MG 24 hr tablet   pantoprazole  (PROTONIX ) 20 MG tablet   torsemide (DEMADEX) 10 MG tablet   Vitamin D, Ergocalciferol, (DRISDOL) 1.25 MG (50000 UNIT) CAPS capsule   BD INSULIN  SYRINGE U/F 31G X 5/16 1 ML MISC   glucose blood test strip   HISTORY: Past Medical History:  Diagnosis Date   Actinic keratosis    Adenovillous polyp of colon    Allergic rhinitis 08/26/2015   Aortic atherosclerosis (HCC)     Arteriosclerosis of coronary artery 01/26/2015   Cardiac murmur    Carotid artery disease (HCC)    Chronic kidney disease (CKD), stage III (moderate) (HCC) 11/19/2014   Diabetes mellitus type 2, insulin  dependent (HCC)    DR (diabetic retinopathy) (HCC)    GERD (gastroesophageal reflux disease)    Glaucoma    History of 2019 novel coronavirus disease (COVID-19) 07/21/2020   Hyperlipidemia    Hypertension    Hypothyroidism    Iron deficiency anemia due to chronic blood loss 04/10/2018   Long-term use of aspirin  therapy    Obesity    Osteopenia    S/P TAVR (transcatheter aortic valve replacement) 09/04/2017   a.) 26 mm Medtronic CoreValve Evolut Pro (SN: [id number]) via RIGHT TF approach   Seborrheic keratosis    Secondary hyperparathyroidism of renal origin (HCC) 02/26/2020   Severe aortic valve stenosis    a.) s/p TAVR 09/04/2017   Spinal stenosis of lumbar region with neurogenic claudication    Status post bilateral cataract extraction    Venous insufficiency of both lower extremities 06/13/2018   Past Surgical History:  Procedure Laterality Date   CATARACT EXTRACTION Left 2010   CATARACT EXTRACTION W/PHACO Right 10/13/2015   Procedure: CATARACT EXTRACTION PHACO AND INTRAOCULAR LENS PLACEMENT (IOC);  Surgeon: Dene Etienne, MD;  Location: [facility];  Service: Ophthalmology;  Laterality: Right;  DIABETIC - insulin  and oral meds MALYUGIN SHUGARCAINE   CHOLECYSTECTOMY N/A 1967   COLONOSCOPY WITH PROPOFOL  N/A 05/16/2018   Procedure: COLONOSCOPY WITH PROPOFOL ;  Surgeon: Unk Corinn Skiff, MD;  Location: [facility] ENDOSCOPY;  Service: Gastroenterology;  Laterality: N/A;  DILATION AND CURETTAGE OF UTERUS     ESOPHAGOGASTRODUODENOSCOPY (EGD) WITH PROPOFOL  N/A 05/16/2018   Procedure: ESOPHAGOGASTRODUODENOSCOPY (EGD) WITH PROPOFOL ;  Surgeon: Unk Corinn Skiff, MD;  Location: [facility] ENDOSCOPY;  Service: Gastroenterology;  Laterality: N/A;   LAPAROSCOPIC TUBAL LIGATION      RIGHT/LEFT HEART CATH AND CORONARY ANGIOGRAPHY N/A 08/02/2017   Procedure: RIGHT/LEFT HEART CATH AND CORONARY ANGIOGRAPHY;  Surgeon: Hester Wolm PARAS, MD;  Location: [facility] INVASIVE CV LAB;  Service: Cardiovascular;  Laterality: N/A;   TEE WITHOUT CARDIOVERSION N/A 09/04/2017   Procedure: TRANSESOPHAGEAL ECHOCARDIOGRAM (TEE);  Surgeon: Wonda Sharper, MD;  Location: [facility] OR;  Service: Open Heart Surgery;  Laterality: N/A;   TONSILLECTOMY Bilateral 1974   TRANSCATHETER AORTIC VALVE REPLACEMENT, TRANSFEMORAL N/A 09/04/2017   Procedure: TRANSCATHETER AORTIC VALVE REPLACEMENT, TRANSFEMORAL;  Surgeon: Wonda Sharper, MD;  Location: [facility] OR;  Service: Open Heart Surgery;  Laterality: N/A;   Family History  Problem Relation Age of Onset   Alzheimer's disease Mother    Colon polyps Mother    Hypertension Mother    Heart attack Father    Coronary artery disease Father    Diabetes Father    Breast cancer Sister    Colon polyps Sister    Diabetes Sister    Diabetes Sister    Diabetes Sister    Diabetes Sister    Social History   Tobacco Use   Smoking status: Never   Smokeless tobacco: Never  Substance Use Topics   Alcohol use: No    Alcohol/week: 0.0 standard drinks of alcohol   LABS:  Hospital Outpatient Visit on 06/16/2024  Component Date Value Ref Range Status   MRSA, PCR 06/16/2024 NEGATIVE  NEGATIVE Final   Staphylococcus aureus 06/16/2024 NEGATIVE  NEGATIVE Final   Comment: (NOTE) The Xpert SA Assay (FDA approved for NASAL specimens in patients 2 years of age and older), is one component of a comprehensive surveillance program. It is not intended to diagnose infection nor to guide or monitor treatment. Performed at [facility], [address] [place], [place] [address]    ABO/RH(D) 06/16/2024 O POS   Final   Antibody Screen 06/16/2024 NEG   Final   Sample Expiration 06/16/2024 06/30/2024,2359   Final   Extend sample reason 06/16/2024    Final                    Value:NO TRANSFUSIONS OR PREGNANCY IN THE PAST 3 MONTHS Performed at [facility], [address] [place], [place] [address]    Sodium 06/16/2024 142  135 - 145 mmol/L Final   Potassium 06/16/2024 4.1  3.5 - 5.1 mmol/L Final   Chloride 06/16/2024 110  98 - 111 mmol/L Final   CO2 06/16/2024 23  22 - 32 mmol/L Final   Glucose, Bld 06/16/2024 158 (H)  70 - 99 mg/dL Final   Glucose reference range applies only to samples taken after fasting for at least 8 hours.   BUN 06/16/2024 31 (H)  8 - 23 mg/dL Final   Creatinine, Ser 06/16/2024 1.71 (H)  0.44 - 1.00 mg/dL Final   Calcium  06/16/2024 9.8  8.9 - 10.3 mg/dL Final   GFR, Estimated 06/16/2024 29 (L)  >60 mL/min Final   Comment: (NOTE) Calculated using the CKD-EPI Creatinine Equation (2021)    Anion gap 06/16/2024 9  5 - 15 Final   Performed at [facility], [address] [place], [place] [address]   WBC 06/16/2024 8.2  4.0 - 10.5 K/uL  Final   RBC 06/16/2024 3.76 (L)  3.87 - 5.11 MIL/uL Final   Hemoglobin 06/16/2024 10.7 (L)  12.0 - 15.0 g/dL Final   HCT 92/85/7974 33.6 (L)  36.0 - 46.0 % Final   MCV 06/16/2024 89.4  80.0 - 100.0 fL Final   MCH 06/16/2024 28.5  26.0 - 34.0 pg Final   MCHC 06/16/2024 31.8  30.0 - 36.0 g/dL Final   RDW 92/85/7974 12.9  11.5 - 15.5 % Final   Platelets 06/16/2024 225  150 - 400 K/uL Final   nRBC 06/16/2024 0.0  0.0 - 0.2 % Final   Performed at [facility], [address] [place], [place] [address]    ECG: Date: 06/16/2024  Time ECG obtained: 1445 PM Rate: 60 bpm Rhythm: Sinus rhythm with first-degree AV block; LVH with repolarization abnormality Axis (leads I and aVF): normal Intervals: PR 228 ms. QRS 90 ms. QTc 440 ms. ST segment and T wave changes: No evidence of acute T wave abnormalities or significant ST segment elevation or depression.  Evidence of a possible, age undetermined, prior infarct:  No Comparison: Similar to previous tracing obtained on  06/26/2022   IMAGING / PROCEDURES: TRANSTHORACIC ECHOCARDIOGRAM performed on 06/18/2024 Normal left ventricular systolic function with an EF of >55% Moderate LVH Indeterminate diastolic Doppler parameters Left atrium mildly dilated Normal right ventricular size and function Trivial PR and TR Severe mitral annular calcification with mild associated regurgitation. Bioprosthetic aortic valve with a mean transvalvular gradient of 13 mmHg; AVA (VTI) = 1.1 cm  MR LUMBAR SPINE WO CONTRAST performed on 04/15/2024 No acute osseous abnormality and largely stable lumbar spine degeneration from MRI last year. Stable up to Severe multifactorial spinal and lateral recess stenosis at L4-L5, Moderate at L3-L4, and mild at L2-L3. Moderate to severe left L3 and right L4 nerve level foraminal stenoses appears somewhat progressed. Other notable neural foraminal stenosis is moderate left L3 and moderate to severe bilateral L5 nerve levels.   CT CORONARY MORPH W/CTA COR W/SCORE W/CA W/CM &/OR WO/CM performed on 08/13/2017 Trileaflet, but functionally bileaflet aortic valve with partially co-joined left and right leaflets. The leaflets are severely thickened and moderately calcified with only trivial calcifications extending into the LVOT. Annular measurements suitable for delivery of a 23 mm Edwards-SAPIEN 3 valve. Sufficient coronary to annular distance. Optimum Fluoroscopic Angle for Delivery: LAO 3 CAU 2 There is a large left atrial appendage with no evidence for a thrombus.  RIGHT/LEFT HEART CATHETERIZATION AND CORONARY ANGIOGRAPHY performed on 08/02/2017 Normal left trickle systolic function with EF of 60%. Moderate LVH Multivessel nonobstructive CAD Prox RCA lesion, 25% stenosed. Mid RCA lesion, 20% stenosed.  Ost Cx to Prox Cx lesion, 10% stenosed. Ost LAD to Prox LAD lesion, 15% stenosed. Acute on chronic CCS III symptoms and significant progression of aortic stenosis; mean gradient = 32 mmHg;  peak gradient = 58 mmHg; AVA = 0.9 cm Hemodynamics: Mean PA = 33 mmHg Mean PCWP = 24 mmHg Recommendations Aggressive medical management of current symptoms with appropriate use of beta blockers, ACE inhibitors, and diuretics. Consider consultation for aortic valve replacement     IMPRESSION AND PLAN: Stacey Pierce has been referred for pre-anesthesia review and clearance prior to her undergoing the planned anesthetic and procedural courses. Available labs, pertinent testing, and imaging results were personally reviewed by me in preparation for upcoming operative/procedural course. [facility] medical record has been updated following extensive record review and patient interview with PAT staff.   This patient has  been appropriately cleared by cardiology with an overall MODERATE risk of patient experiencing significant perioperative cardiovascular complications. Based on clinical review performed today (06/24/24), barring any significant acute changes in the patient's overall condition, it is anticipated that she will be able to proceed with the planned surgical intervention. Any acute changes in clinical condition may necessitate her procedure being postponed and/or cancelled. Patient will meet with anesthesia team (MD and/or CRNA) on the day of her procedure for preoperative evaluation/assessment. Questions regarding anesthetic course will be fielded at that time.   Pre-surgical instructions were reviewed with the patient during his PAT appointment, and questions were fielded to satisfaction by PAT clinical staff. She has been instructed on which medications that she will need to hold prior to surgery, as well as the ones that have been deemed safe/appropriate to take on the day of her procedure. As part of the general education provided by PAT, patient made aware both verbally and in writing, that she would need to abstain from the use of any illegal substances during her perioperative course.  She was advised that failure to follow the provided instructions could necessitate case cancellation or result in serious perioperative complications up to and including death. Patient encouraged to contact PAT and/or her surgeon's office to discuss any questions or concerns that may arise prior to surgery; verbalized understanding.   Dorise Pereyra, MSN, APRN, FNP-C, CEN [facility]  Perioperative Services Nurse Practitioner Phone: [phone] Fax: [phone] 06/24/24 11:05 AM  NOTE: This note has been prepared using Dragon dictation software. Despite my best ability to proofread, there is always the potential that unintentional transcriptional errors may still occur from this process.

## 2024-06-25 NOTE — Telephone Encounter (Signed)
 Left message with cardiology requesting update

## 2024-06-25 NOTE — Telephone Encounter (Signed)
 Sent secure message to cardiology office again requesting update

## 2024-06-25 NOTE — Telephone Encounter (Signed)
 Stacey Pierce called to inform me that cardiology called her earlier this week and notified her that everything looks good for her to proceed with surgery. I am still waiting on her clearance form and will follow up on this today.

## 2024-06-26 ENCOUNTER — Encounter: Payer: Self-pay | Admitting: Neurosurgery

## 2024-06-26 NOTE — Telephone Encounter (Signed)
Clearance has been received.  

## 2024-06-27 ENCOUNTER — Encounter
Admission: RE | Disposition: A | Discharge status: Home / Self Care | Payer: Self-pay | Source: Home / Self Care | Attending: Neurosurgery

## 2024-06-27 ENCOUNTER — Ambulatory Visit: Payer: Self-pay | Admitting: Urgent Care

## 2024-06-27 ENCOUNTER — Ambulatory Visit

## 2024-06-27 ENCOUNTER — Other Ambulatory Visit: Payer: Self-pay

## 2024-06-27 ENCOUNTER — Encounter: Payer: Self-pay | Admitting: Neurosurgery

## 2024-06-27 ENCOUNTER — Observation Stay
Admission: RE | Admit: 2024-06-27 | Discharge: 2024-06-28 | Disposition: A | Discharge status: Home / Self Care | Attending: Neurosurgery | Admitting: Neurosurgery

## 2024-06-27 DIAGNOSIS — E039 Hypothyroidism, unspecified: Secondary | ICD-10-CM | POA: Diagnosis not present

## 2024-06-27 DIAGNOSIS — N183 Chronic kidney disease, stage 3 unspecified: Secondary | ICD-10-CM | POA: Insufficient documentation

## 2024-06-27 DIAGNOSIS — Z0189 Encounter for other specified special examinations: Secondary | ICD-10-CM | POA: Diagnosis not present

## 2024-06-27 DIAGNOSIS — D631 Anemia in chronic kidney disease: Secondary | ICD-10-CM | POA: Diagnosis not present

## 2024-06-27 DIAGNOSIS — Z01812 Encounter for preprocedural laboratory examination: Secondary | ICD-10-CM

## 2024-06-27 DIAGNOSIS — E1122 Type 2 diabetes mellitus with diabetic chronic kidney disease: Secondary | ICD-10-CM | POA: Insufficient documentation

## 2024-06-27 DIAGNOSIS — Z8616 Personal history of COVID-19: Secondary | ICD-10-CM | POA: Diagnosis not present

## 2024-06-27 DIAGNOSIS — Z9889 Other specified postprocedural states: Principal | ICD-10-CM

## 2024-06-27 DIAGNOSIS — I2510 Atherosclerotic heart disease of native coronary artery without angina pectoris: Secondary | ICD-10-CM | POA: Diagnosis not present

## 2024-06-27 DIAGNOSIS — M48062 Spinal stenosis, lumbar region with neurogenic claudication: Principal | ICD-10-CM

## 2024-06-27 DIAGNOSIS — I129 Hypertensive chronic kidney disease with stage 1 through stage 4 chronic kidney disease, or unspecified chronic kidney disease: Secondary | ICD-10-CM | POA: Diagnosis not present

## 2024-06-27 DIAGNOSIS — E1121 Type 2 diabetes mellitus with diabetic nephropathy: Secondary | ICD-10-CM

## 2024-06-27 DIAGNOSIS — Z794 Long term (current) use of insulin: Secondary | ICD-10-CM | POA: Diagnosis not present

## 2024-06-27 DIAGNOSIS — Z01818 Encounter for other preprocedural examination: Secondary | ICD-10-CM

## 2024-06-27 HISTORY — DX: Actinic keratosis: L57.0

## 2024-06-27 HISTORY — DX: Nonrheumatic aortic (valve) stenosis: I35.0

## 2024-06-27 HISTORY — DX: Atherosclerosis of aorta: I70.0

## 2024-06-27 HISTORY — DX: Disorder of arteries and arterioles, unspecified: I77.9

## 2024-06-27 HISTORY — DX: Type 2 diabetes mellitus without complications: E11.9

## 2024-06-27 HISTORY — DX: Long term (current) use of aspirin: Z79.82

## 2024-06-27 HISTORY — DX: Cardiac murmur, unspecified: R01.1

## 2024-06-27 HISTORY — DX: Benign neoplasm of colon, unspecified: D12.6

## 2024-06-27 HISTORY — DX: Other specified disorders of bone density and structure, unspecified site: M85.80

## 2024-06-27 HISTORY — DX: Spinal stenosis, lumbar region with neurogenic claudication: M48.062

## 2024-06-27 HISTORY — DX: Cataract extraction status, right eye: Z98.42

## 2024-06-27 HISTORY — PX: LUMBAR LAMINECTOMY/DECOMPRESSION MICRODISCECTOMY: SHX5026

## 2024-06-27 HISTORY — DX: Type 2 diabetes mellitus without complications: Z79.4

## 2024-06-27 HISTORY — DX: Other seborrheic keratosis: L82.1

## 2024-06-27 LAB — GLUCOSE, CAPILLARY
Glucose-Capillary: 214 mg/dL — ABNORMAL HIGH (ref 70–99)
Glucose-Capillary: 187 mg/dL — ABNORMAL HIGH (ref 70–99)
Glucose-Capillary: 248 mg/dL — ABNORMAL HIGH (ref 70–99)
Glucose-Capillary: 280 mg/dL — ABNORMAL HIGH (ref 70–99)

## 2024-06-27 SURGERY — LUMBAR LAMINECTOMY/DECOMPRESSION MICRODISCECTOMY 2 LEVELS
Anesthesia: General | Site: Back

## 2024-06-27 MED ORDER — INSULIN ASPART 100 UNIT/ML IJ SOLN
20.0000 [IU] | Freq: Every day | INTRAMUSCULAR | Status: DC
  Filled 2024-06-27: qty 1

## 2024-06-27 MED ORDER — SENNA 8.6 MG PO TABS
1.0000 | ORAL_TABLET | Freq: Two times a day (BID) | ORAL | Status: DC
  Administered 2024-06-27 (×2): 8.6 mg via ORAL
  Filled 2024-06-27 (×3): qty 1

## 2024-06-27 MED ORDER — CHLORHEXIDINE GLUCONATE 0.12 % MT SOLN
OROMUCOSAL | Status: AC
  Filled 2024-06-27: qty 15

## 2024-06-27 MED ORDER — PROPOFOL 10 MG/ML IV BOLUS
INTRAVENOUS | Status: DC | PRN
  Administered 2024-06-27: 140 mg via INTRAVENOUS

## 2024-06-27 MED ORDER — SORBITOL 70 % SOLN
30.0000 mL | Freq: Every day | Status: DC | PRN
  Filled 2024-06-27: qty 30

## 2024-06-27 MED ORDER — ATORVASTATIN CALCIUM 20 MG PO TABS
40.0000 mg | ORAL_TABLET | Freq: Every day | ORAL | Status: DC
  Administered 2024-06-27 – 2024-06-28 (×2): 40 mg via ORAL
  Filled 2024-06-27 (×2): qty 2

## 2024-06-27 MED ORDER — FENTANYL CITRATE (PF) 100 MCG/2ML IJ SOLN
INTRAMUSCULAR | Status: DC | PRN
  Administered 2024-06-27: 100 ug via INTRAVENOUS

## 2024-06-27 MED ORDER — MENTHOL 3 MG MT LOZG
1.0000 | LOZENGE | OROMUCOSAL | Status: DC | PRN
  Filled 2024-06-27: qty 9

## 2024-06-27 MED ORDER — METHOCARBAMOL 500 MG PO TABS
500.0000 mg | ORAL_TABLET | Freq: Four times a day (QID) | ORAL | Status: DC | PRN
  Administered 2024-06-27 (×2): 500 mg via ORAL
  Filled 2024-06-27 (×2): qty 1

## 2024-06-27 MED ORDER — HYDRALAZINE HCL 20 MG/ML IJ SOLN
10.0000 mg | Freq: Four times a day (QID) | INTRAMUSCULAR | Status: DC | PRN
  Administered 2024-06-27: 10 mg via INTRAVENOUS
  Filled 2024-06-27: qty 1

## 2024-06-27 MED ORDER — ORAL CARE MOUTH RINSE: 15.0000 mL | Freq: Once | OROMUCOSAL | Status: AC

## 2024-06-27 MED ORDER — INSULIN ASPART 100 UNIT/ML IJ SOLN
0.0000 [IU] | Freq: Every day | INTRAMUSCULAR | Status: DC
  Administered 2024-06-27: 3 [IU] via SUBCUTANEOUS
  Filled 2024-06-27: qty 1

## 2024-06-27 MED ORDER — HYDROMORPHONE HCL 1 MG/ML IJ SOLN: 0.5000 mg | INTRAMUSCULAR | Status: AC | PRN

## 2024-06-27 MED ORDER — OXYCODONE HCL 5 MG PO TABS: 10.0000 mg | ORAL_TABLET | ORAL | Status: DC | PRN

## 2024-06-27 MED ORDER — ACETAMINOPHEN 10 MG/ML IV SOLN
1000.0000 mg | Freq: Once | INTRAVENOUS | Status: DC | PRN
Start: 2024-06-27 — End: 2024-06-27

## 2024-06-27 MED ORDER — PANTOPRAZOLE SODIUM 20 MG PO TBEC
20.0000 mg | DELAYED_RELEASE_TABLET | Freq: Every day | ORAL | Status: DC
  Administered 2024-06-27 – 2024-06-28 (×2): 20 mg via ORAL
  Filled 2024-06-27 (×2): qty 1

## 2024-06-27 MED ORDER — ONDANSETRON HCL 4 MG/2ML IJ SOLN: 4.0000 mg | Freq: Four times a day (QID) | INTRAMUSCULAR | Status: DC | PRN

## 2024-06-27 MED ORDER — INSULIN ASPART 100 UNIT/ML IJ SOLN
0.0000 [IU] | Freq: Three times a day (TID) | INTRAMUSCULAR | Status: DC
  Administered 2024-06-27: 7 [IU] via SUBCUTANEOUS
  Administered 2024-06-28: 4 [IU] via SUBCUTANEOUS
  Filled 2024-06-27 (×2): qty 1

## 2024-06-27 MED ORDER — FENTANYL CITRATE (PF) 100 MCG/2ML IJ SOLN
INTRAMUSCULAR | Status: AC
  Filled 2024-06-27: qty 2

## 2024-06-27 MED ORDER — INSULIN GLARGINE-YFGN 100 UNIT/ML ~~LOC~~ SOLN
60.0000 [IU] | Freq: Every day | SUBCUTANEOUS | Status: DC
  Administered 2024-06-27: 60 [IU] via SUBCUTANEOUS
  Filled 2024-06-27 (×2): qty 0.6

## 2024-06-27 MED ORDER — ACETAMINOPHEN 500 MG PO TABS
1000.0000 mg | ORAL_TABLET | Freq: Four times a day (QID) | ORAL | Status: DC
  Administered 2024-06-27 – 2024-06-28 (×2): 1000 mg via ORAL
  Filled 2024-06-27 (×2): qty 2

## 2024-06-27 MED ORDER — BUPIVACAINE LIPOSOME 1.3 % IJ SUSP
INTRAMUSCULAR | Status: AC
  Filled 2024-06-27: qty 20

## 2024-06-27 MED ORDER — BUPIVACAINE HCL (PF) 0.5 % IJ SOLN
INTRAMUSCULAR | Status: AC
  Filled 2024-06-27: qty 30

## 2024-06-27 MED ORDER — DORZOLAMIDE HCL 2 % OP SOLN
1.0000 [drp] | Freq: Three times a day (TID) | OPHTHALMIC | Status: DC
  Administered 2024-06-27: 1 [drp] via OPHTHALMIC
  Filled 2024-06-27: qty 10

## 2024-06-27 MED ORDER — LOSARTAN POTASSIUM 50 MG PO TABS
50.0000 mg | ORAL_TABLET | Freq: Every day | ORAL | Status: DC
Start: 2024-06-27 — End: 2024-06-28
  Administered 2024-06-27 – 2024-06-28 (×2): 50 mg via ORAL
  Filled 2024-06-27 (×2): qty 1

## 2024-06-27 MED ORDER — SODIUM CHLORIDE 0.9% FLUSH
3.0000 mL | Freq: Two times a day (BID) | INTRAVENOUS | Status: DC
  Administered 2024-06-27 – 2024-06-28 (×3): 3 mL via INTRAVENOUS

## 2024-06-27 MED ORDER — SURGIFLO WITH THROMBIN (HEMOSTATIC MATRIX KIT) OPTIME
TOPICAL | Status: DC | PRN
  Administered 2024-06-27: 1 via TOPICAL

## 2024-06-27 MED ORDER — OXYCODONE HCL 5 MG/5ML PO SOLN: 5.0000 mg | Freq: Once | ORAL | Status: DC | PRN

## 2024-06-27 MED ORDER — 0.9 % SODIUM CHLORIDE (POUR BTL) OPTIME
TOPICAL | Status: DC | PRN
  Administered 2024-06-27: 500 mL

## 2024-06-27 MED ORDER — ONDANSETRON HCL 4 MG PO TABS: 4.0000 mg | ORAL_TABLET | Freq: Four times a day (QID) | ORAL | Status: DC | PRN

## 2024-06-27 MED ORDER — CALCITRIOL 0.25 MCG PO CAPS
0.2500 ug | ORAL_CAPSULE | Freq: Every day | ORAL | Status: DC
  Administered 2024-06-27 – 2024-06-28 (×2): 0.25 ug via ORAL
  Filled 2024-06-27 (×2): qty 1

## 2024-06-27 MED ORDER — SODIUM CHLORIDE 0.9 % IV SOLN: INTRAVENOUS | Status: DC

## 2024-06-27 MED ORDER — ENOXAPARIN SODIUM 30 MG/0.3ML IJ SOSY
30.0000 mg | PREFILLED_SYRINGE | INTRAMUSCULAR | Status: DC
  Administered 2024-06-28: 30 mg via SUBCUTANEOUS
  Filled 2024-06-27: qty 0.3

## 2024-06-27 MED ORDER — MAGNESIUM CITRATE PO SOLN
1.0000 | Freq: Once | ORAL | Status: DC | PRN
  Filled 2024-06-27: qty 296

## 2024-06-27 MED ORDER — ACETAMINOPHEN 500 MG PO TABS
ORAL_TABLET | ORAL | Status: AC
  Filled 2024-06-27: qty 2

## 2024-06-27 MED ORDER — METHOCARBAMOL 1000 MG/10ML IJ SOLN: 500.0000 mg | Freq: Four times a day (QID) | INTRAMUSCULAR | Status: DC | PRN

## 2024-06-27 MED ORDER — OXYCODONE HCL 5 MG PO TABS
5.0000 mg | ORAL_TABLET | ORAL | Status: DC | PRN
  Administered 2024-06-27: 5 mg via ORAL
  Filled 2024-06-27: qty 1

## 2024-06-27 MED ORDER — SODIUM CHLORIDE (PF) 0.9 % IJ SOLN
INTRAMUSCULAR | Status: DC | PRN
  Administered 2024-06-27: 60 mL

## 2024-06-27 MED ORDER — ACETAMINOPHEN 650 MG RE SUPP: 650.0000 mg | RECTAL | Status: DC | PRN

## 2024-06-27 MED ORDER — PHENOL 1.4 % MT LIQD
1.0000 | OROMUCOSAL | Status: DC | PRN
  Filled 2024-06-27: qty 177

## 2024-06-27 MED ORDER — SODIUM CHLORIDE 0.9 % IV SOLN
250.0000 mL | INTRAVENOUS | Status: DC
  Administered 2024-06-27: 250 mL via INTRAVENOUS

## 2024-06-27 MED ORDER — INSULIN ASPART 100 UNIT/ML ~~LOC~~ SOLN: 20.0000 [IU] | Freq: Two times a day (BID) | SUBCUTANEOUS | Status: DC

## 2024-06-27 MED ORDER — CEFAZOLIN IN SODIUM CHLORIDE 2-0.9 GM/100ML-% IV SOLN
2.0000 g | Freq: Once | INTRAVENOUS | Status: AC
  Administered 2024-06-27: 2 g via INTRAVENOUS

## 2024-06-27 MED ORDER — ONDANSETRON HCL 4 MG/2ML IJ SOLN
INTRAMUSCULAR | Status: DC | PRN
Start: 2024-06-27 — End: 2024-06-27
  Administered 2024-06-27: 4 mg via INTRAVENOUS

## 2024-06-27 MED ORDER — PHENYLEPHRINE HCL-NACL 20-0.9 MG/250ML-% IV SOLN
INTRAVENOUS | Status: DC | PRN
  Administered 2024-06-27: 25 ug/min via INTRAVENOUS

## 2024-06-27 MED ORDER — PROPOFOL 10 MG/ML IV BOLUS
INTRAVENOUS | Status: AC
  Filled 2024-06-27: qty 20

## 2024-06-27 MED ORDER — ACETAMINOPHEN 500 MG PO TABS
1000.0000 mg | ORAL_TABLET | Freq: Once | ORAL | Status: AC
  Administered 2024-06-27: 1000 mg via ORAL

## 2024-06-27 MED ORDER — POLYETHYLENE GLYCOL 3350 17 G PO PACK: 17.0000 g | PACK | Freq: Every day | ORAL | Status: DC | PRN

## 2024-06-27 MED ORDER — DROPERIDOL 2.5 MG/ML IJ SOLN: 0.6250 mg | Freq: Once | INTRAMUSCULAR | Status: DC | PRN

## 2024-06-27 MED ORDER — METHYLPREDNISOLONE ACETATE 40 MG/ML IJ SUSP
INTRAMUSCULAR | Status: AC
Start: 2024-06-27 — End: 2024-06-27
  Filled 2024-06-27: qty 1

## 2024-06-27 MED ORDER — TIMOLOL MALEATE 0.5 % OP SOLN
1.0000 [drp] | Freq: Every day | OPHTHALMIC | Status: DC
  Administered 2024-06-28: 1 [drp] via OPHTHALMIC
  Filled 2024-06-27: qty 5

## 2024-06-27 MED ORDER — METHYLPREDNISOLONE ACETATE 40 MG/ML IJ SUSP
INTRAMUSCULAR | Status: DC | PRN
  Administered 2024-06-27: 40 mg via INTRA_ARTICULAR

## 2024-06-27 MED ORDER — PHENYLEPHRINE HCL-NACL 20-0.9 MG/250ML-% IV SOLN
INTRAVENOUS | Status: AC
  Filled 2024-06-27: qty 250

## 2024-06-27 MED ORDER — EPHEDRINE SULFATE-NACL 50-0.9 MG/10ML-% IV SOSY
PREFILLED_SYRINGE | INTRAVENOUS | Status: DC | PRN
  Administered 2024-06-27: 10 mg via INTRAVENOUS

## 2024-06-27 MED ORDER — CHLORHEXIDINE GLUCONATE 0.12 % MT SOLN
15.0000 mL | Freq: Once | OROMUCOSAL | Status: AC
  Administered 2024-06-27: 15 mL via OROMUCOSAL

## 2024-06-27 MED ORDER — CEFAZOLIN SODIUM-DEXTROSE 2-4 GM/100ML-% IV SOLN
INTRAVENOUS | Status: AC
  Filled 2024-06-27: qty 100

## 2024-06-27 MED ORDER — PHENYLEPHRINE 80 MCG/ML (10ML) SYRINGE FOR IV PUSH (FOR BLOOD PRESSURE SUPPORT)
PREFILLED_SYRINGE | INTRAVENOUS | Status: DC | PRN
  Administered 2024-06-27: 160 ug via INTRAVENOUS

## 2024-06-27 MED ORDER — DIPHENHYDRAMINE HCL 25 MG PO CAPS
25.0000 mg | ORAL_CAPSULE | Freq: Every day | ORAL | Status: DC
  Administered 2024-06-27 – 2024-06-28 (×2): 25 mg via ORAL
  Filled 2024-06-27 (×2): qty 1

## 2024-06-27 MED ORDER — FENTANYL CITRATE (PF) 100 MCG/2ML IJ SOLN: 25.0000 ug | INTRAMUSCULAR | Status: DC | PRN

## 2024-06-27 MED ORDER — METOPROLOL SUCCINATE ER 25 MG PO TB24
12.5000 mg | ORAL_TABLET | Freq: Every day | ORAL | Status: DC
Start: 2024-06-27 — End: 2024-06-28
  Administered 2024-06-27 – 2024-06-28 (×2): 12.5 mg via ORAL
  Filled 2024-06-27 (×2): qty 1

## 2024-06-27 MED ORDER — ACETAMINOPHEN 325 MG PO TABS: 650.0000 mg | ORAL_TABLET | ORAL | Status: DC | PRN

## 2024-06-27 MED ORDER — DOCUSATE SODIUM 100 MG PO CAPS
100.0000 mg | ORAL_CAPSULE | Freq: Two times a day (BID) | ORAL | Status: DC
  Administered 2024-06-27 (×2): 100 mg via ORAL
  Filled 2024-06-27 (×3): qty 1

## 2024-06-27 MED ORDER — SUCCINYLCHOLINE CHLORIDE 200 MG/10ML IV SOSY
PREFILLED_SYRINGE | INTRAVENOUS | Status: DC | PRN
  Administered 2024-06-27: 100 mg via INTRAVENOUS

## 2024-06-27 MED ORDER — INSULIN ASPART 100 UNIT/ML IJ SOLN
30.0000 [IU] | Freq: Every day | INTRAMUSCULAR | Status: DC
  Administered 2024-06-28: 30 [IU] via SUBCUTANEOUS
  Filled 2024-06-27: qty 1

## 2024-06-27 MED ORDER — TORSEMIDE 20 MG PO TABS
10.0000 mg | ORAL_TABLET | Freq: Every day | ORAL | Status: DC
  Administered 2024-06-27 – 2024-06-28 (×2): 10 mg via ORAL
  Filled 2024-06-27 (×2): qty 1

## 2024-06-27 MED ORDER — LEVOTHYROXINE SODIUM 112 MCG PO TABS
112.0000 ug | ORAL_TABLET | Freq: Every day | ORAL | Status: DC
Start: 2024-06-28 — End: 2024-06-28
  Administered 2024-06-28: 112 ug via ORAL
  Filled 2024-06-27: qty 1

## 2024-06-27 MED ORDER — DEXAMETHASONE SODIUM PHOSPHATE 10 MG/ML IJ SOLN
INTRAMUSCULAR | Status: DC | PRN
  Administered 2024-06-27: 10 mg via INTRAVENOUS

## 2024-06-27 MED ORDER — DORZOLAMIDE HCL-TIMOLOL MAL 2-0.5 % OP SOLN: 1.0000 [drp] | Freq: Two times a day (BID) | OPHTHALMIC | Status: DC

## 2024-06-27 MED ORDER — OXYCODONE HCL 5 MG PO TABS: 5.0000 mg | ORAL_TABLET | Freq: Once | ORAL | Status: DC | PRN

## 2024-06-27 MED ORDER — LIDOCAINE HCL (CARDIAC) PF 100 MG/5ML IV SOSY
PREFILLED_SYRINGE | INTRAVENOUS | Status: DC | PRN
  Administered 2024-06-27: 100 mg via INTRAVENOUS

## 2024-06-27 MED ORDER — LATANOPROST 0.005 % OP SOLN
1.0000 [drp] | Freq: Every day | OPHTHALMIC | Status: DC
  Administered 2024-06-27: 1 [drp] via OPHTHALMIC
  Filled 2024-06-27: qty 2.5

## 2024-06-27 MED ORDER — SODIUM CHLORIDE 0.9% FLUSH: 3.0000 mL | INTRAVENOUS | Status: DC | PRN

## 2024-06-27 MED ORDER — BUPIVACAINE-EPINEPHRINE (PF) 0.5% -1:200000 IJ SOLN
INTRAMUSCULAR | Status: DC | PRN
Start: 2024-06-27 — End: 2024-06-27
  Administered 2024-06-27: 10 mL

## 2024-06-27 MED ORDER — GABAPENTIN 300 MG PO CAPS
300.0000 mg | ORAL_CAPSULE | Freq: Every day | ORAL | Status: DC
  Administered 2024-06-27: 300 mg via ORAL
  Filled 2024-06-27: qty 1

## 2024-06-27 SURGICAL SUPPLY — 28 items
BASIN KIT SINGLE STR (MISCELLANEOUS) ×1 IMPLANT
BUR NEURO DRILL SOFT 3.0X3.8M (BURR) ×1 IMPLANT
DERMABOND ADVANCED .7 DNX12 (GAUZE/BANDAGES/DRESSINGS) ×1 IMPLANT
DRAPE C ARM PK CFD 31 SPINE (DRAPES) ×1 IMPLANT
DRAPE C-ARM XRAY 36X54 (DRAPES) IMPLANT
DRAPE LAPAROTOMY 100X77 ABD (DRAPES) ×1 IMPLANT
DRAPE MICROSCOPE SPINE 48X150 (DRAPES) ×1 IMPLANT
DRSG OPSITE POSTOP 3X4 (GAUZE/BANDAGES/DRESSINGS) ×1 IMPLANT
ELECTRODE EZSTD 165MM 6.5IN (MISCELLANEOUS) ×1 IMPLANT
ELECTRODE REM PT RTRN 9FT ADLT (ELECTROSURGICAL) ×1 IMPLANT
GLOVE BIOGEL PI IND STRL 6.5 (GLOVE) ×1 IMPLANT
GLOVE SURG SYN 6.5 PF PI (GLOVE) ×1 IMPLANT
GLOVE SURG SYN 8.5 PF PI (GLOVE) ×3 IMPLANT
GOWN SRG LRG LVL 4 IMPRV REINF (GOWNS) ×1 IMPLANT
GOWN SRG XL LVL 3 NONREINFORCE (GOWNS) ×1 IMPLANT
KIT SPINAL PRONEVIEW (KITS) ×1 IMPLANT
MANIFOLD NEPTUNE II (INSTRUMENTS) ×1 IMPLANT
NDL SAFETY ECLIPSE 18X1.5 (NEEDLE) ×1 IMPLANT
NS IRRIG 500ML POUR BTL (IV SOLUTION) ×1 IMPLANT
PACK LAMINECTOMY ARMC (PACKS) ×1 IMPLANT
PAD ARMBOARD POSITIONER FOAM (MISCELLANEOUS) ×1 IMPLANT
SURGIFLO W/THROMBIN 8M KIT (HEMOSTASIS) ×1 IMPLANT
SUT STRATA 3-0 15 PS-2 (SUTURE) ×1 IMPLANT
SUT VIC AB 0 CT1 27XCR 8 STRN (SUTURE) ×1 IMPLANT
SUT VIC AB 2-0 CT1 18 (SUTURE) ×1 IMPLANT
SYR 30ML LL (SYRINGE) ×2 IMPLANT
SYR 3ML LL SCALE MARK (SYRINGE) ×1 IMPLANT
TRAP FLUID SMOKE EVACUATOR (MISCELLANEOUS) ×1 IMPLANT

## 2024-06-27 NOTE — Anesthesia Preprocedure Evaluation (Addendum)
 Anesthesia Evaluation  Patient identified by MRN, date of birth, ID band Patient awake    Reviewed: Allergy & Precautions, H&P , NPO status , Patient's Chart, lab work & pertinent test results  Airway Mallampati: III  TM Distance: >3 FB Neck ROM: full    Dental no notable dental hx.    Pulmonary neg pulmonary ROS   Pulmonary exam normal        Cardiovascular Exercise Tolerance: Poor hypertension, + CAD and + Peripheral Vascular Disease (Carotid artery disease)  Normal cardiovascular exam+ Valvular Problems/Murmurs (09/04/2017: S/P TAVR (transcatheter aortic valve replacement))   Exercise stress echocardiogram 07/2023 normal with no ischemia.   ECHO 06/18/2024 NORMAL LEFT VENTRICULAR SYSTOLIC FUNCTION WITH MODERATE LVH  ESTIMATED EF: >55%  INDETERMINATE DIASTOLIC FUNCTION  NORMAL RIGHT VENTRICULAR SYSTOLIC FUNCTION  VALVULAR REGURGITATION: No AR, MILD MR, TRIVIAL PR, TRIVIAL TR  VALVULAR STENOSIS: OTHER PROSTHETIC AoV, No MS, No PS, No TS     LHC: 2018 RIGHT/LEFT HEART CATHETERIZATION AND CORONARY ANGIOGRAPHY performed on 08/02/2017 1. Normal left trickle systolic function with EF of 60%. 2. Moderate LVH 3. Multivessel nonobstructive CAD  Prox RCA lesion, 25% stenosed.  Mid RCA lesion, 20% stenosed.   Ost Cx to Prox Cx lesion, 10% stenosed.  Ost LAD to Prox LAD lesion, 15% stenosed. 4. Acute on chronic CCS III symptoms and significant progression of aortic stenosis; mean gradient = 32 mmHg; peak gradient = 58 mmHg; AVA = 0.9 cm 5. Hemodynamics:  Mean PA = 33 mmHg  Mean PCWP = 24 mmHg 6. Recommendations  Aggressive medical management of current symptoms with appropriate use of beta blockers, ACE inhibitors, and diuretics.  Consider consultation for aortic valve replacement    Neuro/Psych negative neurological ROS  negative psych ROS   GI/Hepatic Neg liver ROS,GERD  ,,  Endo/Other  diabetes, Well Controlled,  Type 2, Insulin  DependentHypothyroidism    Renal/GU Renal InsufficiencyRenal disease     Musculoskeletal   Abdominal  (+) + obese  Peds  Hematology  (+) Blood dyscrasia, anemia   Anesthesia Other Findings Past Medical History: No date: Actinic keratosis No date: Adenovillous polyp of colon 08/26/2015: Allergic rhinitis No date: Aortic atherosclerosis (HCC) 01/26/2015: Arteriosclerosis of coronary artery No date: Cardiac murmur No date: Carotid artery disease (HCC) 11/19/2014: Chronic kidney disease (CKD), stage III (moderate) (HCC) No date: Diabetes mellitus type 2, insulin  dependent (HCC) No date: DR (diabetic retinopathy) (HCC) No date: GERD (gastroesophageal reflux disease) No date: Glaucoma 07/21/2020: History of 2019 novel coronavirus disease (COVID-19) No date: Hyperlipidemia No date: Hypertension No date: Hypothyroidism 04/10/2018: Iron deficiency anemia due to chronic blood loss No date: Long-term use of aspirin  therapy No date: Obesity No date: Osteopenia 09/04/2017: S/P TAVR (transcatheter aortic valve replacement)     Comment:  a.) 26 mm Medtronic CoreValve Evolut Pro (SN: [id number])               via RIGHT TF approach No date: Seborrheic keratosis 02/26/2020: Secondary hyperparathyroidism of renal origin (HCC) No date: Severe aortic valve stenosis     Comment:  a.) s/p TAVR 09/04/2017 No date: Spinal stenosis of lumbar region with neurogenic claudication No date: Status post bilateral cataract extraction 06/13/2018: Venous insufficiency of both lower extremities  Past Surgical History: 2010: CATARACT EXTRACTION; Left 10/13/2015: CATARACT EXTRACTION W/PHACO; Right     Comment:  Procedure: CATARACT EXTRACTION PHACO AND INTRAOCULAR               LENS PLACEMENT (IOC);  Surgeon: Dene Etienne, MD;  Location: [facility];  Service: Ophthalmology;                Laterality: Right;  DIABETIC - insulin  and oral                meds MALYUGIN SHUGARCAINE 1968: CHOLECYSTECTOMY; N/A 05/16/2018: COLONOSCOPY WITH PROPOFOL ; N/A     Comment:  Procedure: COLONOSCOPY WITH PROPOFOL ;  Surgeon: Unk Corinn Skiff, MD;  Location: [facility] ENDOSCOPY;  Service:               Gastroenterology;  Laterality: N/A; No date: DILATION AND CURETTAGE OF UTERUS 05/16/2018: ESOPHAGOGASTRODUODENOSCOPY (EGD) WITH PROPOFOL ; N/A     Comment:  Procedure: ESOPHAGOGASTRODUODENOSCOPY (EGD) WITH               PROPOFOL ;  Surgeon: Unk Corinn Skiff, MD;  Location:               [facility] ENDOSCOPY;  Service: Gastroenterology;  Laterality:               N/A; No date: LAPAROSCOPIC TUBAL LIGATION 08/02/2017: RIGHT/LEFT HEART CATH AND CORONARY ANGIOGRAPHY; N/A     Comment:  Procedure: RIGHT/LEFT HEART CATH AND CORONARY               ANGIOGRAPHY;  Surgeon: Hester Wolm PARAS, MD;  Location:               [facility];  Service: Cardiovascular;                Laterality: N/A; 09/04/2017: TEE WITHOUT CARDIOVERSION; N/A     Comment:  Procedure: TRANSESOPHAGEAL ECHOCARDIOGRAM (TEE);                Surgeon: Wonda Sharper, MD;  Location: [facility] OR;  Service:              Open Heart Surgery;  Laterality: N/A; 1974: TONSILLECTOMY; Bilateral 09/04/2017: TRANSCATHETER AORTIC VALVE REPLACEMENT, TRANSFEMORAL; N/A     Comment:  Procedure: TRANSCATHETER AORTIC VALVE REPLACEMENT,               TRANSFEMORAL;  Surgeon: Wonda Sharper, MD;  Location:               [facility] OR;  Service: Open Heart Surgery;  Laterality: N/A;     Reproductive/Obstetrics negative OB ROS                              Anesthesia Physical Anesthesia Plan  ASA: 3  Anesthesia Plan: General ETT   Post-op Pain Management: Tylenol  PO (pre-op)* and Precedex    Induction: Intravenous  PONV Risk Score and Plan: 2 and Ondansetron  and Dexamethasone   Airway Management Planned: Oral ETT  Additional Equipment:   Intra-op Plan:   Post-operative Plan:  Extubation in OR  Informed Consent: I have reviewed the patients History and Physical, chart, labs and discussed the procedure including the risks, benefits and alternatives for the proposed anesthesia with the patient or authorized representative who has indicated his/her understanding and acceptance.     Dental Advisory Given  Plan Discussed with: CRNA and Surgeon  Anesthesia Plan Comments:          Anesthesia Quick Evaluation

## 2024-06-27 NOTE — H&P (Signed)
 Referring Physician:  No referring provider defined for this encounter.  Primary Physician:  Sherial Bail, MD  History of Present Illness: 06/27/2024 Stacey Pierce presents today with continued issues in her legs.    05/22/2024 Stacey Pierce is here today with a chief complaint of chronic back pain who presents with worsening mobility issues.  She experiences significant back pain that has worsened over the past year, affecting her mobility. The pain originates in her back and radiates to her buttocks and hips, without extending down her legs. It intensifies with standing or walking, requiring support for ambulation and stepping. Five injections, including an epidural, and physical therapy have not provided relief.  Her mobility has declined over the past six months. Previously able to bowl and walk comfortably, she now requires assistance for walking and struggles with basic movements like stepping off a curb. Discomfort in her hips and buttocks occurs when standing for more than five minutes.  No numbness or tingling in her legs is present, and she does not experience pain in the back of her legs.  Bowel/Bladder Dysfunction: none  Conservative measures:  Physical therapy: has participated in PT at [facility].  Multimodal medical therapy including regular antiinflammatories: Tylenol , Gabapentin, prednisone   Injections: 01/08/2024: Left S1 transforaminal ESI (50% relief, dexamethasone  7 mg)  08/15/2023: RFA to the left L4-5 and L5-S1 facet joints (no relief)  05/24/2023: Kenalog injection performed by PCP 09/21/2021: RFA to the left L4-5 and L5-S1 facet joints (6 months of good relief) 08/17/2021: RFA to the right L4-5 and L5-S1 facet joints (6 months of good relief ) 03/11/2021: MBB to the bilateral L4-5 and L5-S1 facet joints (7/10 to 0/10) 02/25/2021: MBB to the bilateral L4-5 and L5-S1 facet joints (8/10 to 0/10) 01/13/2021: Bilateral S1 transforaminal ESI  (dexamethasone  8 mg, mild relief) 12/07/2020: Right L3-4 and left L4-5 transforaminal ESI (good relief x1 week, dexamethasone  10 mg)   Past Surgery: none  Stacey Pierce has no symptoms of cervical myelopathy.  The symptoms are causing a significant impact on the patient's life.   I have utilized the care everywhere function in epic to review the outside records available from external health systems.  Review of Systems:  A 10 point review of systems is negative, except for the pertinent positives and negatives detailed in the HPI.  Past Medical History: Past Medical History:  Diagnosis Date   Actinic keratosis    Adenovillous polyp of colon    Allergic rhinitis 08/26/2015   Aortic atherosclerosis (HCC)    Arteriosclerosis of coronary artery 01/26/2015   Cardiac murmur    Carotid artery disease (HCC)    Chronic kidney disease (CKD), stage III (moderate) (HCC) 11/19/2014   Diabetes mellitus type 2, insulin  dependent (HCC)    DR (diabetic retinopathy) (HCC)    GERD (gastroesophageal reflux disease)    Glaucoma    History of 2019 novel coronavirus disease (COVID-19) 07/21/2020   Hyperlipidemia    Hypertension    Hypothyroidism    Iron deficiency anemia due to chronic blood loss 04/10/2018   Long-term use of aspirin  therapy    Obesity    Osteopenia    S/P TAVR (transcatheter aortic valve replacement) 09/04/2017   a.) 26 mm Medtronic CoreValve Evolut Pro (SN: [id number]) via RIGHT TF approach   Seborrheic keratosis    Secondary hyperparathyroidism of renal origin (HCC) 02/26/2020   Severe aortic valve stenosis    a.) s/p TAVR 09/04/2017   Spinal stenosis of lumbar region with neurogenic claudication  Status post bilateral cataract extraction    Venous insufficiency of both lower extremities 06/13/2018    Past Surgical History: Past Surgical History:  Procedure Laterality Date   CATARACT EXTRACTION Left 2010   CATARACT EXTRACTION W/PHACO Right 10/13/2015   Procedure:  CATARACT EXTRACTION PHACO AND INTRAOCULAR LENS PLACEMENT (IOC);  Surgeon: Dene Etienne, MD;  Location: [facility];  Service: Ophthalmology;  Laterality: Right;  DIABETIC - insulin  and oral meds Stacey Pierce   CHOLECYSTECTOMY N/A 1967   COLONOSCOPY WITH PROPOFOL  N/A 05/16/2018   Procedure: COLONOSCOPY WITH PROPOFOL ;  Surgeon: Unk Corinn Skiff, MD;  Location: [facility];  Service: Gastroenterology;  Laterality: N/A;   DILATION AND CURETTAGE OF UTERUS     ESOPHAGOGASTRODUODENOSCOPY (EGD) WITH PROPOFOL  N/A 05/16/2018   Procedure: ESOPHAGOGASTRODUODENOSCOPY (EGD) WITH PROPOFOL ;  Surgeon: Unk Corinn Skiff, MD;  Location: [facility] ENDOSCOPY;  Service: Gastroenterology;  Laterality: N/A;   LAPAROSCOPIC TUBAL LIGATION     RIGHT/LEFT HEART CATH AND CORONARY ANGIOGRAPHY N/A 08/02/2017   Procedure: RIGHT/LEFT HEART CATH AND CORONARY ANGIOGRAPHY;  Surgeon: Hester Wolm PARAS, MD;  Location: [facility] CV [facility];  Service: Cardiovascular;  Laterality: N/A;   TEE WITHOUT CARDIOVERSION N/A 09/04/2017   Procedure: TRANSESOPHAGEAL ECHOCARDIOGRAM (TEE);  Surgeon: Wonda Sharper, MD;  Location: [facility] OR;  Service: Open Heart Surgery;  Laterality: N/A;   TONSILLECTOMY Bilateral 1974   TRANSCATHETER AORTIC VALVE REPLACEMENT, TRANSFEMORAL N/A 09/04/2017   Procedure: TRANSCATHETER AORTIC VALVE REPLACEMENT, TRANSFEMORAL;  Surgeon: Wonda Sharper, MD;  Location: [facility] OR;  Service: Open Heart Surgery;  Laterality: N/A;    Allergies: Allergies as of 05/22/2024 - Review Complete 05/22/2024  Allergen Reaction Noted   Hydrocodone  bit-homatrop mbr  02/11/2018   Azithromycin Rash 02/17/2014   Codeine Other (See Comments) 05/10/2015   Erythromycin Rash 08/26/2015   Penicillins Rash 05/10/2015   Sulfa antibiotics Other (See Comments) 05/10/2015    Medications:  Current Facility-Administered Medications:    0.9 %  sodium chloride  infusion, , Intravenous, Continuous, Myra Lynwood MATSU, MD, Last  Rate: 10 mL/hr at 06/27/24 0922, New Bag at 06/27/24 9077   ceFAZolin (ANCEF) IVPB 2g/100 mL premix, 2 g, Intravenous, Once, Pierce Fret, MD  Social History: Social History   Tobacco Use   Smoking status: Never   Smokeless tobacco: Never  Vaping Use   Vaping status: Never Used  Substance Use Topics   Alcohol use: No    Alcohol/week: 0.0 standard drinks of alcohol   Drug use: Never    Family Medical History: Family History  Problem Relation Age of Onset   Alzheimer's disease Mother    Colon polyps Mother    Hypertension Mother    Heart attack Father    Coronary artery disease Father    Diabetes Father    Breast cancer Sister    Colon polyps Sister    Diabetes Sister    Diabetes Sister    Diabetes Sister    Diabetes Sister     Physical Examination: Vitals:   06/27/24 0839  BP: (!) 185/57  Pulse: 65  Resp: 16  Temp: 97.7 F (36.5 C)  SpO2: 96%   Heart sounds normal no MRG. Chest Clear to Auscultation Bilaterally.  General: Patient is in no apparent distress. Attention to examination is appropriate.  Neck:   Supple.  Full range of motion.  Respiratory: Patient is breathing without any difficulty.   NEUROLOGICAL:     Awake, alert, oriented to person, place, and time.  Speech is clear and fluent.   Cranial Nerves: Pupils  equal round and reactive to light.  Facial tone is symmetric.  Facial sensation is symmetric. Shoulder shrug is symmetric. Tongue protrusion is midline.  There is no pronator drift.  Strength: Side Biceps Triceps Deltoid Interossei Grip Wrist Ext. Wrist Flex.  R 5 5 5 5 5 5 5   L 5 5 5 5 5 5 5    Side Iliopsoas Quads Hamstring PF DF EHL  R 5 5 5 5 5 5   L 5 5 5 5 5 5    Gait is untested.     Medical Decision Making  Imaging: MRI L spine 04/15/2024 IMPRESSION: No acute osseous abnormality and largely stable lumbar spine degeneration from MRI last year:   - stable up to Severe multifactorial spinal and lateral recess stenosis  at L4-L5, Moderate at L3-L4, and mild at L2-L3.   - moderate to severe left L3 and right L4 nerve level foraminal stenoses appears somewhat progressed.   - other notable neural foraminal stenosis is moderate left L3 and moderate to severe bilateral L5 nerve levels.     Electronically Signed   By: VEAR Hurst M.D.   On: 05/13/2024 07:32    I have personally reviewed the images and agree with the above interpretation.  Assessment and Plan: Ms. Krog is a pleasant 83 y.o. female with neurogenic claudication.  She has tried and failed conservative management.  We will proceed with L3-5 lumbar decompression.       Stacey Whitacre K. Clois MD, [facility]

## 2024-06-27 NOTE — Plan of Care (Signed)
  Problem: Education: Goal: Knowledge of General Education information will improve Description: Including pain rating scale, medication(s)/side effects and non-pharmacologic comfort measures Outcome: Progressing   Problem: Health Behavior/Discharge Planning: Goal: Ability to manage health-related needs will improve Outcome: Progressing   Problem: Pain Managment: Goal: General experience of comfort will improve and/or be controlled Outcome: Progressing   Problem: Safety: Goal: Ability to remain free from injury will improve Outcome: Progressing   Problem: Skin Integrity: Goal: Risk for impaired skin integrity will decrease Outcome: Progressing   Problem: Education: Goal: Ability to describe self-care measures that may prevent or decrease complications (Diabetes Survival Skills Education) will improve Outcome: Progressing

## 2024-06-27 NOTE — Discharge Instructions (Signed)

## 2024-06-27 NOTE — Transfer of Care (Signed)
 Immediate Anesthesia Transfer of Care Note  Patient: Stacey Pierce  Procedure(s) Performed: LUMBAR LAMINECTOMY/DECOMPRESSION MICRODISCECTOMY 2 LEVELS (Back)  Patient Location: PACU  Anesthesia Type:General  Level of Consciousness: drowsy  Airway & Oxygen Therapy: Patient Spontanous Breathing and Patient connected to face mask oxygen  Post-op Assessment: Report given to RN and Post -op Vital signs reviewed and stable  Post vital signs: Reviewed and stable  Last Vitals:  Vitals Value Taken Time  BP 168/67 06/27/24 13:02  Temp 36.3 C 06/27/24 13:02  Pulse 66 06/27/24 13:03  Resp 17 06/27/24 13:03  SpO2 100 % 06/27/24 13:03  Vitals shown include unfiled device data.  Last Pain:  Vitals:   06/27/24 0839  TempSrc: Temporal  PainSc: 0-No pain         Complications: No notable events documented.

## 2024-06-27 NOTE — Anesthesia Postprocedure Evaluation (Signed)
 Anesthesia Post Note  Patient: LAI HENDRIKS  Procedure(s) Performed: LUMBAR LAMINECTOMY/DECOMPRESSION MICRODISCECTOMY 2 LEVELS (Back)  Patient location during evaluation: PACU Anesthesia Type: General Level of consciousness: awake and alert Pain management: pain level controlled Vital Signs Assessment: post-procedure vital signs reviewed and stable Respiratory status: spontaneous breathing, nonlabored ventilation and respiratory function stable Cardiovascular status: blood pressure returned to baseline and stable Postop Assessment: no apparent nausea or vomiting Anesthetic complications: no   No notable events documented.   Last Vitals:  Vitals:   06/27/24 1411 06/27/24 1642  BP: (!) 193/64 (!) 185/65  Pulse: 64 66  Resp: 18 19  Temp:  (!) 36.4 C  SpO2: 96% 97%    Last Pain:  Vitals:   06/27/24 1642  TempSrc: Oral  PainSc:                  Camellia Merilee Louder

## 2024-06-27 NOTE — Op Note (Signed)
 Indications: Ms. Roiza Wiedel is suffering from lumbar stenosis causing neurogenic claudication (ICD10 M48.062). The patient tried and failed conservative management, prompting surgical intervention.  Findings: lumbar stenosis  Preoperative Diagnosis: Lumbar Stenosis with neurogenic claudication Postoperative Diagnosis: same   EBL: 25 ml IVF:see anesthesia record Drains: none Disposition: Extubated and Stable to PACU Complications: none  No foley catheter was placed.   Preoperative Note:  Risks of surgery discussed include: infection, bleeding, stroke, coma, death, paralysis, CSF leak, nerve/spinal cord injury, numbness, tingling, weakness, complex regional pain syndrome, recurrent stenosis and/or disc herniation, vascular injury, development of instability, neck/back pain, need for further surgery, persistent symptoms, development of deformity, and the risks of anesthesia. The patient understood these risks and agreed to proceed.  Operative Note:   1. L3-5 lumbar decompression including central laminectomy and bilateral medial facetectomies including foraminotomies  The patient was then brought from the preoperative center with intravenous access established.  The patient underwent general anesthesia and endotracheal tube intubation, and was then rotated on the [place] rail top where all pressure points were appropriately padded.  The skin was then thoroughly cleansed.  Perioperative antibiotic prophylaxis was administered.  Sterile prep and drapes were then applied and a timeout was then observed.  C-arm was brought into the field under sterile conditions and under lateral visualization the L4-5 interspace was identified and marked.  The incision was marked on the left and injected with local anesthetic. Once this was complete a 4 cm incision was opened with the use of a #10 blade knife.    The metrx tubes were sequentially advanced and confirmed in position at L4-5. An 18mm by 80mm  tube was locked in place to the bed side attachment.  The microscope was then sterilely brought into the field and muscle creep was hemostased with a bipolar and resected with a pituitary rongeur.  A Bovie extender was then used to expose the spinous process and lamina.  Careful attention was placed to not violate the facet capsule. A 3 mm matchstick drill bit was then used to make a hemi-laminotomy trough until the ligamentum flavum was exposed.  This was extended to the base of the spinous process and to the contralateral side to remove all the central bone from each side.  Once this was complete and the underlying ligamentum flavum was visualized, it was dissected with a curette and resected with Kerrison rongeurs.  Extensive ligamentum hypertrophy was noted, requiring a substantial amount of time and care for removal.  The dura was identified and palpated. The kerrison rongeur was then used to remove the medial facet bilaterally until no compression was noted.  A balltip probe was used to confirm decompression of the ipsilateral L5 nerve root.  Additional attention was paid to completion of the contralateral L4-5 foraminotomy until the contralateral traversing nerve root was completely free.  Once this was complete, L4-5 central decompression including medial facetectomy and foraminotomy was confirmed and decompression on both sides was confirmed. No CSF leak was noted.  Depo-Medrol  was placed on the nerve root.  The wound was copiously irrigated. The tube system was then removed under microscopic visualization and hemostasis was obtained with a bipolar.    After performing the decompression at L4-5, the metrx tubes were sequentially advanced and confirmed in position at L3-4. An 18mm by 70mm tube was locked in place to the bed side attachment.  Fluoroscopy was then removed from the field.  The microscope was then sterilely brought into the field and muscle creep was  hemostased with a bipolar and resected  with a pituitary rongeur.  A Bovie extender was then used to expose the spinous process and lamina.  Careful attention was placed to not violate the facet capsule. A 3 mm matchstick drill bit was then used to make a hemi-laminotomy trough until the ligamentum flavum was exposed.  This was extended to the base of the spinous process and to the contralateral side to remove all the central bone from each side.  Once this was complete and the underlying ligamentum flavum was visualized, it was dissected with a curette and resected with Kerrison rongeurs.  Extensive ligamentum hypertrophy was noted, requiring a substantial amount of time and care for removal.  The dura was identified and palpated. The kerrison rongeur was then used to remove the medial facet bilaterally until no compression was noted.  A balltip probe was used to confirm decompression of the ipsilateral L4 nerve root.  Additional attention was paid to completion of the contralateral foraminotomy until the contralateral L4 nerve root was completely free.  Once this was complete, L3-4 central decompression including medial facetectomy and foraminotomy was confirmed and decompression on both sides was confirmed. No CSF leak was noted.  Depo-Medrol  was placed on the nerve root.   The wound was copiously irrigated. The tube system was then removed under microscopic visualization and hemostasis was obtained with a bipolar.     The fascial layer was reapproximated with the use of a 0 Vicryl suture.  Subcutaneous tissue layer was reapproximated using 2-0 Vicryl suture.  3-0 monocryl was placed in subcuticular fashion. The skin was then cleansed and Dermabond was used to close the skin opening.  Patient was then rotated back to the preoperative bed awakened from anesthesia and taken to recovery all counts are correct in this case.  I performed the entire procedure.   Ahmya Bernick K. Clois MD

## 2024-06-27 NOTE — Anesthesia Procedure Notes (Signed)
 Procedure Name: Intubation Date/Time: 06/27/2024 10:38 AM  Performed by: Jaylene Nest, CRNAPre-anesthesia Checklist: Patient identified, Patient being monitored, Timeout performed, Emergency Drugs available and Suction available Patient Re-evaluated:Patient Re-evaluated prior to induction Oxygen Delivery Method: Circle system utilized Preoxygenation: Pre-oxygenation with 100% oxygen Induction Type: IV induction Ventilation: Mask ventilation without difficulty Laryngoscope Size: 3 and McGrath Grade View: Grade I Tube type: Oral Tube size: 7.0 mm Number of attempts: 1 Airway Equipment and Method: Stylet and Video-laryngoscopy Placement Confirmation: ETT inserted through vocal cords under direct vision, positive ETCO2 and breath sounds checked- equal and bilateral Secured at: 22 cm Tube secured with: Tape Dental Injury: Teeth and Oropharynx as per pre-operative assessment

## 2024-06-28 ENCOUNTER — Encounter: Payer: Self-pay | Admitting: Neurosurgery

## 2024-06-28 DIAGNOSIS — M48062 Spinal stenosis, lumbar region with neurogenic claudication: Secondary | ICD-10-CM | POA: Diagnosis not present

## 2024-06-28 LAB — GLUCOSE, CAPILLARY: Glucose-Capillary: 193 mg/dL — ABNORMAL HIGH (ref 70–99)

## 2024-06-28 MED ORDER — OXYCODONE HCL 5 MG PO TABS: 5.0000 mg | ORAL_TABLET | ORAL | 0 refills | Status: DC | PRN

## 2024-06-28 MED ORDER — METHOCARBAMOL 500 MG PO TABS: 500.0000 mg | ORAL_TABLET | Freq: Four times a day (QID) | ORAL | 0 refills | Status: DC | PRN

## 2024-06-28 MED ORDER — SENNA 8.6 MG PO TABS: 1.0000 | ORAL_TABLET | Freq: Two times a day (BID) | ORAL | 0 refills | Status: DC | PRN

## 2024-06-28 NOTE — Evaluation (Signed)
 Occupational Therapy Evaluation Patient Details Name: Stacey Pierce MRN: [id number] DOB: [birth year] Today's Date: 06/28/2024   History of Present Illness   Pt admitted to [facility] on 06/27/24 under observation for elective L3-5 decompression by Dr. Clois. Significant PMH includes: retinopathy, HTN, diabetes, CKD (III), hypothyroidism, hx TAVR, obesity, venous insufficiency, and B12 deficiency.    Clinical impression:                                    Patient up in chair agreeable to work with OT.  Husband present.  Reviewed with patient again back protection principles as well as handout reviewed with patient again.  Patient limited in lower body ADLs because of back protection principles.  Patient was educated in lower body ADLs using adaptive equipment.  Min assist to standby assist using adaptive equipment for shoes and socks as well as pants..  Information provided to get adaptive equipment on [employer].  Educated husband to don and doff TED hose or compression hose.  Functional mobility to bathroom including toilet transfers and toilet hygiene with clothing management patient supervision.  Patient needed verbal cueing for  back protection principles washing hands at sink and not twisting back.  Husband assist with cooking and laundry.  As well as buying groceries.  Discussed with patient the importance of home health visit for safety evaluation as well as review with shower transfers toilet transfers as well as negotiating steps in and out of the house.  Patient in agreement.  Patient needs can be met in following venue being home.         Encourage OOB mobility with nursing and mobility tech for meals and toileting for continued progress towards goals and maintenance of IND with functional mobility while hospitalized.   Encourage OOB mobility with nursing and mobility tech for meals and toileting for continued progress towards goals and maintenance of IND with functional mobility while  hospitalized. Clinical Impressions      If plan is discharge home, recommend the following:    Help with stairs or ramp for entrance;A little help with bathing/dressing/bathroom;Assist for transportation;Assistance with cooking/housew      Functional Status Assessment    Patient has had a recent decline in their functional status and demonstrates the ability to make significant improvements in function in a reasonable and predictable amount of time      Equipment Recommendations    Rolling walker     Recommendations for Other Services         Precautions/Restrictions   Precautions Precautions: Back Precaution Booklet Issued: Yes (comment) Recall of Precautions/Restrictions: Intact Restrictions Weight Bearing Restrictions Per Provider Order: No     Mobility Bed Mobility                    Transfers Overall transfer level: Independent                 General transfer comment: Patient i supervision with verbal cueing for sit and stand as well as ambulation using rolling walker to bathroom.  Supervision with verbal cueing for toileting.      Balance                                           ADL either performed or assessed with clinical judgement  ADL Overall ADL's : Modified independent                                       General ADL Comments: Patient was educated in adaptive equipment training for lower body bathing and dressing..  Patient min assist using sock aid and reacher for donning and doffing socks as well as donning pants.  Patient needed verbal cueing to review back protection principles during ADLs.  Patient wanted to twist to wash her hands.  Reviewed and educated husband in donning and doffing of TED hose.  Toileting supervision.  Regular height.     Vision Baseline Vision/History: 1 Wears glasses Patient Visual Report: No change from baseline       Perception         Praxis          Pertinent Vitals/Pain Pain Assessment Pain Assessment: No/denies pain     Extremity/Trunk Assessment Upper Extremity Assessment Upper Extremity Assessment: Overall WFL for tasks assessed   Lower Extremity Assessment Lower Extremity Assessment: Overall WFL for tasks assessed       Communication     Cognition Arousal: Alert Behavior During Therapy: WFL for tasks assessed/performed                                 Following commands: Intact       Cueing  General Comments      Patient balance within normal limits for pulling up pants, performing toilet hygiene and washing hands at the sink.  Using rolling walker.  No loss of balance.   Exercises Other Exercises Other Exercises: Patient and husband was educated in adaptive equipment use for lower body ADLs.  Information provided to order on [employer].  Also reviewed and educated husband on donning and doffing TED hose.  Reviewed again back protection principles during ADLs.  And functional mobility and ADLs.  Needed 1 or 2 reminders and review.  Reviewed handout with patient again.   Shoulder Instructions      Home Living Family/patient expects to be discharged to:: Private residence Living Arrangements: Spouse/significant other Available Help at Discharge: Family;Available 24 hours/day Type of Home: House Home Access: Stairs to enter [employer] of Steps: 4 Entrance Stairs-Rails: Can reach both Home Layout: Multi-level Alternate Level Stairs-Number of Steps: stays on main level with bedroom and full bath   Bathroom Shower/Tub: Producer, television/film/video: Handicapped height Bathroom Accessibility: Yes   Home Equipment: Agricultural consultant (2 wheels);Rollator (4 wheels);Hand held shower head;Grab bars - tub/shower;Shower seat          Prior Functioning/Environment Prior Level of Function : Driving             Mobility Comments: mod I for ambulation with rollator, limited standing  tolerance <8min secondary to back/LE pain. No hx of falls. ADLs Comments: Patient perform basic ADLs.  Independent medication management.  Husband assist with cooking and cleaning as well as laundry and making the bed.    OT Problem List: Decreased knowledge of use of DME or AE;Decreased safety awareness   OT Treatment/Interventions:        OT Goals(Current goals can be found in the care plan section)   Acute Rehab OT Goals Patient Stated Goal: Want to get home OT Goal Formulation: With patient/family Time For Goal Achievement: 06/28/24 Potential to Achieve  Goals: Good   OT Frequency:       Co-evaluation              AM-PAC OT 6 Clicks Daily Activity     Outcome Measure Help from another person eating meals?: None Help from another person taking care of personal grooming?: None Help from another person toileting, which includes using toliet, bedpan, or urinal?: A Little Help from another person bathing (including washing, rinsing, drying)?: A Little Help from another person to put on and taking off regular upper body clothing?: None Help from another person to put on and taking off regular lower body clothing?: A Little 6 Click Score: 21   End of Session Nurse Communication: Mobility status  Activity Tolerance: Patient tolerated treatment well Patient left: in chair;with family/visitor present;with nursing/sitter in room  OT Visit Diagnosis: Unsteadiness on feet (R26.81)                Time: 8969-8886 OT Time Calculation (min): 43 min Charges:  OT General Charges $OT Visit: 1 Visit OT Evaluation $OT Eval Low Complexity: 1 Low OT Treatments $Self Care/Home Management : 8-22 mins    Alka Falwell OTR/L,CLT 06/28/2024, 1:15 PM

## 2024-06-28 NOTE — Care Management Obs Status (Signed)
 MEDICARE OBSERVATION STATUS NOTIFICATION   Patient Details  Name: Stacey Pierce MRN: [id number] Date of Birth: 06/09/1941   Medicare Observation Status Notification Given:  Yes    Jamespaul Secrist I Eben, LCSW 06/28/2024, 8:29 AM

## 2024-06-28 NOTE — Progress Notes (Signed)
 Neurosurgery Progress Note  History: Stacey Pierce is here for lumbar decompression.  She is postoperative day 1.  She is doing well.  Physical Exam: Vitals:   06/28/24 0424 06/28/24 0738  BP: (!) 102/48 (!) 123/45  Pulse: 75 69  Resp: 14 17  Temp: 98.2 F (36.8 C) 98.4 F (36.9 C)  SpO2: 94% 94%    AA Ox3 CNI  Strength:5/5 throughout BLE Incision c/d/i  Data:  Other tests/results: n/a  Assessment/Plan:  [name] for lumbar stenosis.  She is doing well.  - mobilize - pain control - DVT prophylaxis - PTOT   Reeves Daisy MD, [facility] Department of Neurosurgery

## 2024-06-28 NOTE — Evaluation (Signed)
 Physical Therapy Evaluation Patient Details Name: Stacey Pierce MRN: [id number] DOB: April 02, 1941 Today's Date: 06/28/2024  History of Present Illness  Pt admitted to [facility] on 06/27/24 under observation for elective L3-5 decompression by Dr. Clois. Significant PMH includes: retinopathy, HTN, diabetes, CKD (III), hypothyroidism, hx TAVR, obesity, venous insufficiency, and B12 deficiency.   Clinical Impression  Pt agreeable for PT eval; received supine in bed. At baseline, pt is mod I for ADL's, medication management, and ambulation with AD; spouse assists with IADL's and transportation.   Pt presents with increased pain levels, decreased activity tolerance, and decreased standing balance, resulting in impaired functional mobility from baseline. Due to deficits, pt required SBA for bed mobility, transfers, gait with RW, and stair navigation with BUE for support. Demonstrates good understanding and adherence to spinal precautions during functional mobility.   Deficits limit the pt's ability to safely and independently perform ADL's, transfer, and ambulate. Pt will benefit from acute skilled PT services to address deficits for return to baseline function. Pt will benefit from post acute therapy services to address deficits for return to baseline function.  Encourage OOB mobility with nursing and mobility tech for meals and toileting for continued progress towards goals and maintenance of IND with functional mobility while hospitalized.         If plan is discharge home, recommend the following: Help with stairs or ramp for entrance;A little help with bathing/dressing/bathroom;Assist for transportation;Assistance with cooking/housework   Can travel by private vehicle        Equipment Recommendations Rolling walker (2 wheels)  Recommendations for Other Services       Functional Status Assessment Patient has had a recent decline in their functional status and demonstrates the ability to make  significant improvements in function in a reasonable and predictable amount of time.     Precautions / Restrictions Precautions Precautions: Back Precaution Booklet Issued: Yes (comment) Recall of Precautions/Restrictions: Intact Required Braces or Orthoses:  (no brace) Restrictions Weight Bearing Restrictions Per Provider Order: No      Mobility  Bed Mobility               General bed mobility comments: SBA for safety to exit bed to the R via log roll technique, multimodal cues for safety, sequencing, and hand placement. Increased time/effort.    Transfers                   General transfer comment: SBA for safety to perform STS transfers at EOB and recliner with RW. Multimodal cues for safety, sequencing, hand placement, and RW proximity.    Ambulation/Gait   Gait Distance (Feet): 60 Feet           General Gait Details: SBA to ambulate to/from stairwell with RW. Demonstrates slowed cadence with early reciprocal gait.  Stairs Stairs: Yes     Number of Stairs: 4 General stair comments: SBA for safety and line management to navigate 4 steps with step to gait pattern and BUE support on railing.     Balance Overall balance assessment: Modified Independent, Mild deficits observed, not formally tested                                           Pertinent Vitals/Pain Pain Assessment Pain Assessment: 0-10 Pain Score: 10-Worst pain ever Pain Location: stomach where she just received (medication) shot Pain Descriptors / Indicators: Burning Pain  Intervention(s): Monitored during session, Repositioned    Home Living Family/patient expects to be discharged to:: Private residence Living Arrangements: Spouse/significant other Available Help at Discharge: Family;Available 24 hours/day Type of Home: House Home Access: Stairs to enter Entrance Stairs-Rails: Can reach both Entrance Stairs-Number of Steps: 4 Alternate Level Stairs-Number of  Steps: stays on main level with bedroom and full bath Home Layout: Multi-level Home Equipment: Agricultural consultant (2 wheels);Rollator (4 wheels);Hand held shower head;Grab bars - tub/shower;Shower seat      Prior Function Prior Level of Function : Driving             Mobility Comments: mod I for ambulation with rollator, limited standing tolerance <30min secondary to back/LE pain. No hx of falls. ADLs Comments: mod I for ADL's and medication management; spouse assist with IADL's     Extremity/Trunk Assessment   Upper Extremity Assessment Upper Extremity Assessment: Overall WFL for tasks assessed    Lower Extremity Assessment Lower Extremity Assessment: Overall WFL for tasks assessed       Communication        Cognition Arousal: Alert Behavior During Therapy: WFL for tasks assessed/performed   PT - Cognitive impairments: No apparent impairments                         Following commands: Intact                Exercises Other Exercises Other Exercises: Pt and spouse educated re: PT role/POC, DC recommendations, spinal precautions, log roll technique, amb to/from bathroom, use of RW v rollator, spinal/back packet, energy conservation/pacing, sleep in recliner. They verbalized understanding.   Assessment/Plan    PT Assessment Patient needs continued PT services  PT Problem List Decreased activity tolerance;Decreased balance;Decreased mobility       PT Treatment Interventions DME instruction;Gait training;Stair training;Functional mobility training;Therapeutic activities;Therapeutic exercise;Balance training;Neuromuscular re-education    PT Goals (Current goals can be found in the Care Plan section)  Acute Rehab PT Goals Patient Stated Goal: go home today PT Goal Formulation: With patient Time For Goal Achievement: 07/12/24 Potential to Achieve Goals: Good    Frequency Min 1X/week        AM-PAC PT 6 Clicks Mobility  Outcome Measure Help  needed turning from your back to your side while in a flat bed without using bedrails?: A Little Help needed moving from lying on your back to sitting on the side of a flat bed without using bedrails?: A Little Help needed moving to and from a bed to a chair (including a wheelchair)?: A Little Help needed standing up from a chair using your arms (e.g., wheelchair or bedside chair)?: A Little Help needed to walk in hospital room?: A Little Help needed climbing 3-5 steps with a railing? : A Little 6 Click Score: 18    End of Session Equipment Utilized During Treatment: Gait belt Activity Tolerance: Patient tolerated treatment well Patient left: in chair;with call bell/phone within reach;with family/visitor present Nurse Communication: Mobility status PT Visit Diagnosis: Unsteadiness on feet (R26.81)    Time: 9055-8984 PT Time Calculation (min) (ACUTE ONLY): 31 min   Charges:   PT Evaluation $PT Eval Moderate Complexity: 1 Mod PT Treatments $Therapeutic Activity: 8-22 mins PT General Charges $$ ACUTE PT VISIT: 1 Visit        Camie CHARLENA Kluver, PT, DPT 12:39 PM,06/28/24 Physical Therapist - [facility]

## 2024-06-28 NOTE — TOC Progression Note (Signed)
 Transition of Care [facility]) - Progression Note    Patient Details  Name: Stacey Pierce MRN: [id number] Date of Birth: 13-Jan-1941  [facility] CM/SW Contact  Lorraine LILLETTE Fenton, LCSW Phone Number: 06/28/2024, 11:26 AM  Clinical Narrative:     CSW visited pt at bedside, spouse present delivered MOON.  When CSW returned to provide MOON copy discussed HHPT, pt agreeable had no choice of provider.  CSW agreed to try to match a provider.  CSW matched pt with [facility] and added to AVS after Joane agreed to accept pt.  No further IPCM needs.     Barriers to Discharge: Continued Medical Work up               Expected Discharge Plan and Services         Expected Discharge Date: 06/28/24                                     Social Drivers of Health (SDOH) Interventions SDOH Screenings   Food Insecurity: No Food Insecurity (03/25/2024)   Received from [facility]  Housing: Unknown (03/25/2024)   Received from [facility]  Transportation Needs: No Transportation Needs (03/25/2024)   Received from [facility]  Utilities: Not At Risk (03/25/2024)   Received from [facility]  Alcohol Screen: Low Risk  (09/02/2020)  Depression (PHQ2-9): Low Risk  (08/10/2020)  Financial Resource Strain: Low Risk  (03/25/2024)   Received from [facility]  Physical Activity: Inactive (09/02/2020)  Social Connections: Moderately Integrated (09/02/2020)  Stress: No Stress Concern Present (09/02/2020)  Tobacco Use: Low Risk  (06/27/2024)    Readmission Risk Interventions     No data to display

## 2024-06-28 NOTE — Discharge Summary (Signed)
 Physician Discharge Summary  Patient ID: ARVA SLAUGH MRN: [id number] DOB/AGE: Jan 10, 1941 83 y.o.  Admit date: 06/27/2024 Discharge date: 06/28/2024  Admission Diagnoses: Principal Problem:   Status post lumbar spine operation Active Problems:   Lumbar stenosis with neurogenic claudication   Discharge Diagnoses:  Principal Problem:   Status post lumbar spine operation Active Problems:   Lumbar stenosis with neurogenic claudication   Discharged Condition: good  Hospital Course: [name] presented for lumbar stenosis.  She underwent surgical intervention.  She was admitted overnight for observation.  She tolerated this well and was cleared by physical and Occupational Therapy.  Consults: None  Significant Diagnostic Studies: radiology: X-Ray: L3-5 localization  Treatments: surgery: L3-5 decompression  Discharge Exam: Blood pressure (!) 123/45, pulse 69, temperature 98.4 F (36.9 C), resp. rate 17, height 5' 5 (1.651 m), weight 89.8 kg, SpO2 94%. General appearance: alert and cooperative CNI MAEW  Disposition: Discharge disposition: 01-Home or Self Care       Discharge Instructions     Discharge patient   Complete by: As directed    Discharge disposition: 01-Home or Self Care   Discharge patient date: 06/28/2024   Face-to-face encounter (required for Medicare/Medicaid patients)   Complete by: As directed    I REEVES DAISY certify that this patient is under my care and that I, or a nurse practitioner or physician's assistant working with me, had a face-to-face encounter that meets the physician face-to-face encounter requirements with this patient on 06/28/2024. The encounter with the patient was in whole, or in part for the following medical condition(s) which is the primary reason for home health care (List medical condition): lumbar stenosis   The encounter with the patient was in whole, or in part, for the following medical condition, which is the primary  reason for home health care: lumbar stenosis   I certify that, based on my findings, the following services are medically necessary home health services:  Nursing Physical therapy     Reason for Medically Necessary Home Health Services: Therapy- Home Adaptation to Facilitate Safety   My clinical findings support the need for the above services: Pain interferes with ambulation/mobility   Further, I certify that my clinical findings support that this patient is homebound due to: Pain interferes with ambulation/mobility   Home Health   Complete by: As directed    To provide the following care/treatments:  PT OT     Incentive spirometry RT   Complete by: As directed       Allergies as of 06/28/2024       Reactions   Hydrocodone  Bit-homatrop Mbr    Nausea/dizziness   Azithromycin Rash   MYCIN GROUP    Codeine Other (See Comments)   COLIC   Erythromycin Rash   Penicillins Rash   Has tolerated 1st (CEPHALEXIN ) and 2nd (CEFUROXIME ) generation cephalosporins in the past with no documented ADRs.   Sulfa Antibiotics Other (See Comments)   COLIC        Medication List     TAKE these medications    acetaminophen  500 MG tablet Commonly known as: TYLENOL  Take 1,000 mg by mouth every 6 (six) hours as needed for mild pain.   aspirin  EC 81 MG tablet Take 81 mg by mouth daily.   atorvastatin  40 MG tablet Commonly known as: LIPITOR TAKE 1 TABLET BY MOUTH EVERY DAY   BD Insulin  Syringe U/F 31G X 5/16 1 ML Misc Generic drug: Insulin  Syringe-Needle U-100 4 (four) times daily.   calcitRIOL   0.25 MCG capsule Commonly known as: ROCALTROL  Take 0.25 mcg by mouth daily.   [employer] ALLERGY PO Take 1 tablet by mouth daily.   dorzolamide -timolol  2-0.5 % ophthalmic solution Commonly known as: COSOPT  Place 1 drop into both eyes 2 (two) times daily.   gabapentin  300 MG capsule Commonly known as: NEURONTIN  Take 300 mg by mouth at bedtime.   glucose blood test strip Use three test  strips daily as instructed.   insulin  aspart 100 UNIT/ML injection Commonly known as: novoLOG  Inject 20-30 Units into the skin 2 (two) times daily before a meal. Takes 30 units before breakfast and 20 units before dinner.   insulin  detemir 100 UNIT/ML injection Commonly known as: LEVEMIR  Inject 20 units into the skin in the morning and 55 units at bedtime   Lantus  100 UNIT/ML injection Generic drug: insulin  glargine Inject 60 Units into the skin at bedtime.   latanoprost  0.005 % ophthalmic solution Commonly known as: XALATAN  Place 1 drop into both eyes at bedtime.   levothyroxine  112 MCG tablet Commonly known as: SYNTHROID  Take 1 tablet (112 mcg total) by mouth daily before breakfast. Please schedule office visit before any future refill.   losartan  50 MG tablet Commonly known as: COZAAR  Take 50 mg by mouth daily.   methocarbamol  500 MG tablet Commonly known as: ROBAXIN  Take 1 tablet (500 mg total) by mouth every 6 (six) hours as needed for muscle spasms.   metoprolol  succinate 25 MG 24 hr tablet Commonly known as: TOPROL -XL Take 12.5 mg by mouth daily.   oxyCODONE  5 MG immediate release tablet Commonly known as: Oxy IR/ROXICODONE  Take 1 tablet (5 mg total) by mouth every 4 (four) hours as needed for moderate pain (pain score 4-6).   pantoprazole  20 MG tablet Commonly known as: PROTONIX  Take 20 mg by mouth daily.   senna 8.6 MG Tabs tablet Commonly known as: SENOKOT Take 1 tablet (8.6 mg total) by mouth 2 (two) times daily as needed for mild constipation.   torsemide  10 MG tablet Commonly known as: DEMADEX  Take 10 mg by mouth daily.   Vitamin D (Ergocalciferol) 1.25 MG (50000 UNIT) Caps capsule Commonly known as: DRISDOL Take 50,000 Units by mouth every 7 (seven) days.        Follow-up Information     Hilma Hastings, PA-C Follow up on 07/10/2024.   Specialty: Neurosurgery Why: 3 pm Contact information: [address] [place]  [address] [phone]                 Signed: REEVES DAISY 06/28/2024, 10:50 AM

## 2024-07-01 ENCOUNTER — Encounter: Payer: Self-pay | Admitting: Neurosurgery

## 2024-07-01 DIAGNOSIS — Z4789 Encounter for other orthopedic aftercare: Secondary | ICD-10-CM | POA: Diagnosis not present

## 2024-07-01 DIAGNOSIS — D500 Iron deficiency anemia secondary to blood loss (chronic): Secondary | ICD-10-CM | POA: Diagnosis not present

## 2024-07-01 DIAGNOSIS — E669 Obesity, unspecified: Secondary | ICD-10-CM | POA: Diagnosis not present

## 2024-07-01 DIAGNOSIS — N183 Chronic kidney disease, stage 3 unspecified: Secondary | ICD-10-CM | POA: Diagnosis not present

## 2024-07-01 DIAGNOSIS — Z7982 Long term (current) use of aspirin: Secondary | ICD-10-CM | POA: Diagnosis not present

## 2024-07-01 DIAGNOSIS — Z9181 History of falling: Secondary | ICD-10-CM | POA: Diagnosis not present

## 2024-07-01 DIAGNOSIS — Z952 Presence of prosthetic heart valve: Secondary | ICD-10-CM | POA: Diagnosis not present

## 2024-07-01 DIAGNOSIS — D631 Anemia in chronic kidney disease: Secondary | ICD-10-CM | POA: Diagnosis not present

## 2024-07-01 DIAGNOSIS — E11319 Type 2 diabetes mellitus with unspecified diabetic retinopathy without macular edema: Secondary | ICD-10-CM | POA: Diagnosis not present

## 2024-07-01 DIAGNOSIS — E039 Hypothyroidism, unspecified: Secondary | ICD-10-CM | POA: Diagnosis not present

## 2024-07-01 DIAGNOSIS — Z6832 Body mass index (BMI) 32.0-32.9, adult: Secondary | ICD-10-CM | POA: Diagnosis not present

## 2024-07-01 DIAGNOSIS — Z860101 Personal history of adenomatous and serrated colon polyps: Secondary | ICD-10-CM | POA: Diagnosis not present

## 2024-07-01 DIAGNOSIS — E1122 Type 2 diabetes mellitus with diabetic chronic kidney disease: Secondary | ICD-10-CM | POA: Diagnosis not present

## 2024-07-01 DIAGNOSIS — I2510 Atherosclerotic heart disease of native coronary artery without angina pectoris: Secondary | ICD-10-CM | POA: Diagnosis not present

## 2024-07-01 DIAGNOSIS — E785 Hyperlipidemia, unspecified: Secondary | ICD-10-CM | POA: Diagnosis not present

## 2024-07-01 DIAGNOSIS — Z8616 Personal history of COVID-19: Secondary | ICD-10-CM | POA: Diagnosis not present

## 2024-07-01 DIAGNOSIS — K219 Gastro-esophageal reflux disease without esophagitis: Secondary | ICD-10-CM | POA: Diagnosis not present

## 2024-07-01 DIAGNOSIS — N2581 Secondary hyperparathyroidism of renal origin: Secondary | ICD-10-CM | POA: Diagnosis not present

## 2024-07-01 DIAGNOSIS — G8929 Other chronic pain: Secondary | ICD-10-CM | POA: Diagnosis not present

## 2024-07-01 DIAGNOSIS — M48062 Spinal stenosis, lumbar region with neurogenic claudication: Secondary | ICD-10-CM | POA: Diagnosis not present

## 2024-07-01 DIAGNOSIS — Z794 Long term (current) use of insulin: Secondary | ICD-10-CM | POA: Diagnosis not present

## 2024-07-01 DIAGNOSIS — I700 Atherosclerosis of aorta: Secondary | ICD-10-CM | POA: Diagnosis not present

## 2024-07-01 DIAGNOSIS — I872 Venous insufficiency (chronic) (peripheral): Secondary | ICD-10-CM | POA: Diagnosis not present

## 2024-07-01 DIAGNOSIS — H409 Unspecified glaucoma: Secondary | ICD-10-CM | POA: Diagnosis not present

## 2024-07-01 NOTE — Progress Notes (Signed)
   REFERRING PHYSICIAN:  Sherial Bail, Md [address] [place],  [place] [address]  DOS: 06/27/24 L3-L5 decompression  HISTORY OF PRESENT ILLNESS: Stacey Pierce is approximately 2 weeks status post above surgery. Was given oxycodone  and robaxin  on discharge from the hospital.   She is feeling better. Her back pain has improved. No leg pain. No numbness, tingling, or weakness. Has some twinges of left buttock pain with walking.   She is not taking oxycodone  or robaxin . She is taking prn tylenol .    PHYSICAL EXAMINATION:  General: Patient is well developed, well nourished, calm, collected, and in no apparent distress.   NEUROLOGICAL:  General: In no acute distress.   Awake, alert, oriented to person, place, and time.  Pupils equal round and reactive to light.  Facial tone is symmetric.     Strength:          Side Iliopsoas Quads Hamstring PF DF EHL  R 5 5 5 5 5 5   L 5 5 5 5 5 5    Incision c/d/i   ROS (Neurologic):  Negative except as noted above  IMAGING: Nothing new to review.   ASSESSMENT/PLAN:  Stacey Pierce is doing well s/p above surgery. Treatment options reviewed with patient and following plan made:   - I have advised the patient to lift up to 10 pounds until 6 weeks after surgery (follow up with Dr. Clois).  - Reviewed wound care.  - No bending, twisting, or lifting.  - Continue on current medications including prn tylenol .  - Follow up as scheduled in 4 weeks and prn.   Advised to contact the office if any questions or concerns arise.  Glade Boys PA-C Department of neurosurgery

## 2024-07-10 ENCOUNTER — Encounter: Payer: Self-pay | Admitting: Orthopedic Surgery

## 2024-07-10 ENCOUNTER — Ambulatory Visit (INDEPENDENT_AMBULATORY_CARE_PROVIDER_SITE_OTHER): Admitting: Orthopedic Surgery

## 2024-07-10 VITALS — BP 132/76 | Temp 98.1°F | Ht 65.0 in | Wt 201.4 lb

## 2024-07-10 DIAGNOSIS — Z09 Encounter for follow-up examination after completed treatment for conditions other than malignant neoplasm: Secondary | ICD-10-CM

## 2024-07-10 DIAGNOSIS — Z9889 Other specified postprocedural states: Secondary | ICD-10-CM

## 2024-07-10 DIAGNOSIS — M48062 Spinal stenosis, lumbar region with neurogenic claudication: Secondary | ICD-10-CM

## 2024-07-11 DIAGNOSIS — E1122 Type 2 diabetes mellitus with diabetic chronic kidney disease: Secondary | ICD-10-CM | POA: Diagnosis not present

## 2024-07-11 DIAGNOSIS — G8929 Other chronic pain: Secondary | ICD-10-CM | POA: Diagnosis not present

## 2024-07-11 DIAGNOSIS — M48062 Spinal stenosis, lumbar region with neurogenic claudication: Secondary | ICD-10-CM | POA: Diagnosis not present

## 2024-07-11 DIAGNOSIS — N2581 Secondary hyperparathyroidism of renal origin: Secondary | ICD-10-CM | POA: Diagnosis not present

## 2024-07-11 DIAGNOSIS — N183 Chronic kidney disease, stage 3 unspecified: Secondary | ICD-10-CM | POA: Diagnosis not present

## 2024-07-11 DIAGNOSIS — Z4789 Encounter for other orthopedic aftercare: Secondary | ICD-10-CM | POA: Diagnosis not present

## 2024-07-11 DIAGNOSIS — D631 Anemia in chronic kidney disease: Secondary | ICD-10-CM | POA: Diagnosis not present

## 2024-08-07 ENCOUNTER — Encounter: Payer: Self-pay | Admitting: Neurosurgery

## 2024-08-07 ENCOUNTER — Ambulatory Visit (INDEPENDENT_AMBULATORY_CARE_PROVIDER_SITE_OTHER): Admitting: Neurosurgery

## 2024-08-07 VITALS — BP 146/70 | Temp 97.9°F | Ht 65.0 in | Wt 201.0 lb

## 2024-08-07 DIAGNOSIS — M48062 Spinal stenosis, lumbar region with neurogenic claudication: Secondary | ICD-10-CM

## 2024-08-07 DIAGNOSIS — Z09 Encounter for follow-up examination after completed treatment for conditions other than malignant neoplasm: Secondary | ICD-10-CM

## 2024-08-07 MED ORDER — METHYLPREDNISOLONE 4 MG PO TBPK: ORAL_TABLET | ORAL | 0 refills | Status: AC

## 2024-08-07 NOTE — Progress Notes (Signed)
   REFERRING PHYSICIAN:  Sherial Bail, Md [address] [place],  [place] [address]  DOS: 06/27/24 L3-L5 decompression  HISTORY OF PRESENT ILLNESS: Stacey Pierce is  status post above surgery.   She is having significant back pain.  Her leg pain is getting better.  PHYSICAL EXAMINATION:  General: Patient is well developed, well nourished, calm, collected, and in no apparent distress.   NEUROLOGICAL:  General: In no acute distress.   Awake, alert, oriented to person, place, and time.  Pupils equal round and reactive to light.  Facial tone is symmetric.     Strength:          Side Iliopsoas Quads Hamstring PF DF EHL  R 5 5 5 5 5 5   L 5 5 5 5 5 5    Incision c/d/i   ROS (Neurologic):  Negative except as noted above  IMAGING: Nothing new to review.   ASSESSMENT/PLAN:  Stacey Pierce is doing well s/p above surgery.   I have discussed outpatient physical therapy with her.  She prefers not to do this.  She will continue her home exercises.  I will start her on a Medrol  Dosepak.  We will see her back in 6 weeks or sooner if her pain worsens.  If her pain worsens or is not improved at her next appointment, we will consider sending for an injection versus repeating her MRI scan.    Reeves Daisy MD Department of neurosurgery

## 2024-08-19 DIAGNOSIS — K2900 Acute gastritis without bleeding: Secondary | ICD-10-CM | POA: Diagnosis not present

## 2024-08-19 DIAGNOSIS — Z794 Long term (current) use of insulin: Secondary | ICD-10-CM | POA: Diagnosis not present

## 2024-08-19 DIAGNOSIS — E039 Hypothyroidism, unspecified: Secondary | ICD-10-CM | POA: Diagnosis not present

## 2024-08-19 DIAGNOSIS — R109 Unspecified abdominal pain: Secondary | ICD-10-CM | POA: Diagnosis not present

## 2024-08-19 DIAGNOSIS — R1084 Generalized abdominal pain: Secondary | ICD-10-CM | POA: Diagnosis not present

## 2024-08-19 DIAGNOSIS — N184 Chronic kidney disease, stage 4 (severe): Secondary | ICD-10-CM | POA: Diagnosis not present

## 2024-08-19 DIAGNOSIS — E782 Mixed hyperlipidemia: Secondary | ICD-10-CM | POA: Diagnosis not present

## 2024-08-19 DIAGNOSIS — E1122 Type 2 diabetes mellitus with diabetic chronic kidney disease: Secondary | ICD-10-CM | POA: Diagnosis not present

## 2024-08-21 DIAGNOSIS — R110 Nausea: Secondary | ICD-10-CM | POA: Diagnosis not present

## 2024-08-21 DIAGNOSIS — K2900 Acute gastritis without bleeding: Secondary | ICD-10-CM | POA: Diagnosis not present

## 2024-08-21 DIAGNOSIS — R109 Unspecified abdominal pain: Secondary | ICD-10-CM | POA: Diagnosis not present

## 2024-08-26 DIAGNOSIS — Z794 Long term (current) use of insulin: Secondary | ICD-10-CM | POA: Diagnosis not present

## 2024-08-26 DIAGNOSIS — E1142 Type 2 diabetes mellitus with diabetic polyneuropathy: Secondary | ICD-10-CM | POA: Diagnosis not present

## 2024-08-26 DIAGNOSIS — I10 Essential (primary) hypertension: Secondary | ICD-10-CM | POA: Diagnosis not present

## 2024-08-26 DIAGNOSIS — N184 Chronic kidney disease, stage 4 (severe): Secondary | ICD-10-CM | POA: Diagnosis not present

## 2024-08-26 DIAGNOSIS — E1122 Type 2 diabetes mellitus with diabetic chronic kidney disease: Secondary | ICD-10-CM | POA: Diagnosis not present

## 2024-09-02 DIAGNOSIS — E782 Mixed hyperlipidemia: Secondary | ICD-10-CM | POA: Diagnosis not present

## 2024-09-02 DIAGNOSIS — Z794 Long term (current) use of insulin: Secondary | ICD-10-CM | POA: Diagnosis not present

## 2024-09-02 DIAGNOSIS — E039 Hypothyroidism, unspecified: Secondary | ICD-10-CM | POA: Diagnosis not present

## 2024-09-02 DIAGNOSIS — E1122 Type 2 diabetes mellitus with diabetic chronic kidney disease: Secondary | ICD-10-CM | POA: Diagnosis not present

## 2024-09-02 DIAGNOSIS — M4800 Spinal stenosis, site unspecified: Secondary | ICD-10-CM | POA: Diagnosis not present

## 2024-09-02 DIAGNOSIS — N184 Chronic kidney disease, stage 4 (severe): Secondary | ICD-10-CM | POA: Diagnosis not present

## 2024-09-02 DIAGNOSIS — R8290 Unspecified abnormal findings in urine: Secondary | ICD-10-CM | POA: Diagnosis not present

## 2024-09-02 DIAGNOSIS — Z1331 Encounter for screening for depression: Secondary | ICD-10-CM | POA: Diagnosis not present

## 2024-09-02 DIAGNOSIS — E1169 Type 2 diabetes mellitus with other specified complication: Secondary | ICD-10-CM | POA: Diagnosis not present

## 2024-09-02 DIAGNOSIS — E785 Hyperlipidemia, unspecified: Secondary | ICD-10-CM | POA: Diagnosis not present

## 2024-09-02 DIAGNOSIS — R809 Proteinuria, unspecified: Secondary | ICD-10-CM | POA: Diagnosis not present

## 2024-09-02 DIAGNOSIS — E11319 Type 2 diabetes mellitus with unspecified diabetic retinopathy without macular edema: Secondary | ICD-10-CM | POA: Diagnosis not present

## 2024-09-02 DIAGNOSIS — E1142 Type 2 diabetes mellitus with diabetic polyneuropathy: Secondary | ICD-10-CM | POA: Diagnosis not present

## 2024-09-02 DIAGNOSIS — Z0000 Encounter for general adult medical examination without abnormal findings: Secondary | ICD-10-CM | POA: Diagnosis not present

## 2024-09-02 DIAGNOSIS — I10 Essential (primary) hypertension: Secondary | ICD-10-CM | POA: Diagnosis not present

## 2024-09-02 DIAGNOSIS — E1129 Type 2 diabetes mellitus with other diabetic kidney complication: Secondary | ICD-10-CM | POA: Diagnosis not present

## 2024-09-08 ENCOUNTER — Encounter: Admitting: Physician Assistant

## 2024-09-09 DIAGNOSIS — H401131 Primary open-angle glaucoma, bilateral, mild stage: Secondary | ICD-10-CM | POA: Diagnosis not present

## 2024-09-10 DIAGNOSIS — D631 Anemia in chronic kidney disease: Secondary | ICD-10-CM | POA: Diagnosis not present

## 2024-09-10 DIAGNOSIS — E1122 Type 2 diabetes mellitus with diabetic chronic kidney disease: Secondary | ICD-10-CM | POA: Diagnosis not present

## 2024-09-10 DIAGNOSIS — N2581 Secondary hyperparathyroidism of renal origin: Secondary | ICD-10-CM | POA: Diagnosis not present

## 2024-09-10 DIAGNOSIS — I129 Hypertensive chronic kidney disease with stage 1 through stage 4 chronic kidney disease, or unspecified chronic kidney disease: Secondary | ICD-10-CM | POA: Diagnosis not present

## 2024-09-10 DIAGNOSIS — N184 Chronic kidney disease, stage 4 (severe): Secondary | ICD-10-CM | POA: Diagnosis not present

## 2024-09-14 NOTE — Progress Notes (Unsigned)
   Telephone Visit- Progress Note: Referring Physician:  Sherial Bail, MD [address] [place],  [place] [address]  Primary Physician:  Sherial Bail, MD  This visit was performed via telephone.  Patient location: home Provider location: office  I spent a total of 15 minutes non-face-to-face activities for this visit on the date of this encounter including review of current clinical condition and response to treatment.    Patient has given verbal consent to this telephone visits and we reviewed the limitations of a telephone visit. Patient wishes to proceed.   She had an in person visit and left without being seen since I was running 30 minutes behind schedule. She was changed to phone visit and I called her at home.    Chief Complaint:  postop visit.   DOS: 06/27/24 L3-L5 decompression  HISTORY OF PRESENT ILLNESS:  She was still having LBP at her last visit. Her leg pain was improved.   She was given dose pack at last visit. He discussed that if pain was not better or worse at this visit, then we would consider referral for injection and/or repeat MRI scan.  She started using cubi exerciser (pedaling her feet) after her last visit and this made her LBP worse. She got a OTC brace and this has helped. She still has intermittent LBP that is worse with standing over 5 minutes and with walking. She has to use a walker or hold on to something when walking- she feels weak in her back and has to sit down. No leg pain. No numbness, tingling, or weakness in her legs.   She did not have any relief with medrol  dose pack.   No bowel or bladder issues.    Exam: No exam done as this was a telephone encounter.     Imaging: none  I have personally reviewed the images and agree with the above interpretation.  Assessment and Plan: Stacey Pierce is doing fair s/p above surgery. She continues with LBP with any significant standing or walking. No leg pain.   Treatment  options reviewed with patient and following plan made:   - Discussed revisiting lumbar injections and she declines.  - MRI of lumbar spine to evaluate persistent LBP.  - Will review MRI results with Dr. Clois and contact her regarding follow up.    Advised to contact the office if any questions or concerns arise.  Glade Boys PA-C Department of neurosurgery

## 2024-09-16 ENCOUNTER — Encounter: Admitting: Orthopedic Surgery

## 2024-09-16 DIAGNOSIS — H401131 Primary open-angle glaucoma, bilateral, mild stage: Secondary | ICD-10-CM | POA: Diagnosis not present

## 2024-09-16 DIAGNOSIS — H26492 Other secondary cataract, left eye: Secondary | ICD-10-CM | POA: Diagnosis not present

## 2024-09-16 DIAGNOSIS — E113293 Type 2 diabetes mellitus with mild nonproliferative diabetic retinopathy without macular edema, bilateral: Secondary | ICD-10-CM | POA: Diagnosis not present

## 2024-09-16 DIAGNOSIS — H26491 Other secondary cataract, right eye: Secondary | ICD-10-CM | POA: Diagnosis not present

## 2024-09-17 ENCOUNTER — Encounter: Payer: Self-pay | Admitting: Orthopedic Surgery

## 2024-09-17 ENCOUNTER — Ambulatory Visit: Admitting: Orthopedic Surgery

## 2024-09-17 VITALS — BP 136/78 | Temp 98.2°F | Ht 65.0 in | Wt 201.0 lb

## 2024-09-17 DIAGNOSIS — Z09 Encounter for follow-up examination after completed treatment for conditions other than malignant neoplasm: Secondary | ICD-10-CM

## 2024-09-17 DIAGNOSIS — M48062 Spinal stenosis, lumbar region with neurogenic claudication: Secondary | ICD-10-CM

## 2024-09-17 DIAGNOSIS — Z9889 Other specified postprocedural states: Secondary | ICD-10-CM

## 2024-09-18 NOTE — Addendum Note (Signed)
 Addended by: HILMA HASTINGS on: 09/18/2024 01:33 PM   Modules accepted: Orders

## 2024-10-01 ENCOUNTER — Ambulatory Visit
Admission: RE | Admit: 2024-10-01 | Discharge: 2024-10-01 | Disposition: A | Source: Ambulatory Visit | Attending: Orthopedic Surgery | Admitting: Orthopedic Surgery

## 2024-10-01 DIAGNOSIS — M4804 Spinal stenosis, thoracic region: Secondary | ICD-10-CM | POA: Diagnosis not present

## 2024-10-01 DIAGNOSIS — M48062 Spinal stenosis, lumbar region with neurogenic claudication: Secondary | ICD-10-CM

## 2024-10-01 DIAGNOSIS — M5126 Other intervertebral disc displacement, lumbar region: Secondary | ICD-10-CM | POA: Diagnosis not present

## 2024-10-01 DIAGNOSIS — Z9889 Other specified postprocedural states: Secondary | ICD-10-CM

## 2024-10-01 DIAGNOSIS — M5134 Other intervertebral disc degeneration, thoracic region: Secondary | ICD-10-CM | POA: Diagnosis not present

## 2024-10-01 DIAGNOSIS — M5124 Other intervertebral disc displacement, thoracic region: Secondary | ICD-10-CM | POA: Diagnosis not present

## 2024-10-01 DIAGNOSIS — M4807 Spinal stenosis, lumbosacral region: Secondary | ICD-10-CM | POA: Diagnosis not present

## 2024-10-01 DIAGNOSIS — M47817 Spondylosis without myelopathy or radiculopathy, lumbosacral region: Secondary | ICD-10-CM | POA: Diagnosis not present

## 2024-10-06 ENCOUNTER — Encounter: Payer: Self-pay | Admitting: Orthopedic Surgery

## 2024-10-06 NOTE — Progress Notes (Signed)
 Lumbar MRI dated 10/01/24:  FINDINGS:   BONES AND ALIGNMENT: Normal alignment except for 3 mm degenerative retrolisthesis at L3-L4 similar to prior. Normal vertebral body heights. Background marrow signal is unremarkable unless otherwise stated. Disc desiccation at all lumbar levels. Type 2 degenerative endplate findings at L2-L3, L3-L4, and to a lesser extent at L4-L5. Mild type 2 degenerative endplate findings at L1-L2. Schmorl node along the posterior inferior endplate at L4 increased in size.   SPINAL CORD: The conus terminates normally at the L1 level.   SOFT TISSUES: No paraspinal mass.   L1-L2: Mild disc bulge. No impingement. Similar to prior.   L2-L3: Borderline central stenosis due to disc osteophyte complex.   L3-L4: 3 mm degenerative retrolisthesis similar to prior. Moderate left and borderline right foraminal stenosis and moderate central stenosis due to disc bulge, left foraminal intervertebral spurring, and facet arthropathy. Interval left laminectomy with mild improvement in the degree of central stenosis.   L4-L5: Moderate to prominent central stenosis and moderate bilateral foraminal stenosis along with moderate right subarticular lateral recess stenosis due to central disc protrusion, disc bulge, and facet arthropathy. The central stenosis is mildly improved from previous status post left laminectomy.   L5-S1: Moderate bilateral foraminal stenosis and borderline left subarticular lateral recess stenosis due to disc osteophyte complex and facet arthropathy. Similar to prior.   IMPRESSION: 1. Status post left laminectomy at L3-L4 and L4-L5 with mild improvement in central stenosis at both levels. 2. Moderate to prominent central stenosis, moderate bilateral foraminal stenosis, and moderate right subarticular lateral recess stenosis at L4-L5. 3. Moderate left foraminal stenosis and moderate central stenosis at L3-L4. 4. Moderate bilateral foraminal  stenosis at L5-S1.   Electronically signed by: Ryan Salvage MD 10/04/2024 04:29 PM EDT RP Workstation: [id number]   Thoracic MRI dated 10/01/24:  FINDINGS:   BONES AND ALIGNMENT: Normal alignment. Normal vertebral body heights. Bone marrow signal: Hemangioma centered to the right of T12 vertebral body. No abnormal enhancement. Mild Schmorl node along the endplate at T7, T8, and T9.   SPINAL CORD: Normal spinal cord volume. Normal spinal cord signal.   SOFT TISSUES: Unremarkable.   DEGENERATIVE CHANGES: Disc desiccation at all levels between T5 and T10. T1-T2: Unremarkable. T2-T3: Unremarkable. T3-T4: Unremarkable. T4-T5: Small central disc protrusion. No impingement. T5-T6: Unremarkable. T6-T7: Unremarkable. T7-T8: Central disc protrusion extending caudad causing moderate central stenosis. T8-T9: Minimal disc bulge. No impingement. T9-T10: Unremarkable. T10-T11: Unremarkable. T11-T12: Mild disc bulge. No impingement. T12-T1: Mild disc bulge. No impingement. No significant neural foraminal narrowing.   IMPRESSION: 1. Degenerative disc disease, with moderate central canal stenosis at T7-T8 due to central disc protrusion extending caudad.   Electronically signed by: Ryan Salvage MD 10/04/2024 04:41 PM EDT RP Workstation: [id number]     I have personally reviewed the images and agree with the above interpretation.  Above imaging reviewed with Dr. Clois as well. She is still likely a candidate for percutaneous placement of SCS. He recommends referral to pain management to for possible percutaneous SCS vs injections.   Will schedule follow up with her to discuss.

## 2024-10-08 NOTE — Progress Notes (Unsigned)
 Telephone Visit- Progress Note: Referring Physician:  Sherial Bail, MD [address] [place],  [place] [address]  Primary Physician:  Sherial Bail, MD  This visit was performed via telephone.  Patient location: home Provider location: working from home  I spent a total of 10 minutes non-face-to-face activities for this visit on the date of this encounter including review of current clinical condition and response to treatment.    Patient has given verbal consent to this telephone visits and we reviewed the limitations of a telephone visit. Patient wishes to proceed.    Chief Complaint:  follow up  DOS: 06/27/24 L3-L5 decompression  HISTORY OF PRESENT ILLNESS:  She continued with LBP with any standing or walking. Has trouble cooking and doing housework due to pain. No leg pain.   Phone visit scheduled to review her thoracic and lumbar MRI scan.    She has intermittent LBP that is worse standing for over 5-10 minutes. She does not have pain with walking or sitting. No leg pain. No numbness, tingling, or weakness. She is using her walker. She is a little better than she was at last visit- she knows what she can and can't do.  No bowel or bladder issues.    Exam: No exam done as this was a telephone encounter.     Imaging: Lumbar MRI dated 10/01/24:  FINDINGS:   BONES AND ALIGNMENT: Normal alignment except for 3 mm degenerative retrolisthesis at L3-L4 similar to prior. Normal vertebral body heights. Background marrow signal is unremarkable unless otherwise stated. Disc desiccation at all lumbar levels. Type 2 degenerative endplate findings at L2-L3, L3-L4, and to a lesser extent at L4-L5. Mild type 2 degenerative endplate findings at L1-L2. Schmorl node along the posterior inferior endplate at L4 increased in size.   SPINAL CORD: The conus terminates normally at the L1 level.   SOFT TISSUES: No paraspinal mass.   L1-L2: Mild disc bulge. No  impingement. Similar to prior.   L2-L3: Borderline central stenosis due to disc osteophyte complex.   L3-L4: 3 mm degenerative retrolisthesis similar to prior. Moderate left and borderline right foraminal stenosis and moderate central stenosis due to disc bulge, left foraminal intervertebral spurring, and facet arthropathy. Interval left laminectomy with mild improvement in the degree of central stenosis.   L4-L5: Moderate to prominent central stenosis and moderate bilateral foraminal stenosis along with moderate right subarticular lateral recess stenosis due to central disc protrusion, disc bulge, and facet arthropathy. The central stenosis is mildly improved from previous status post left laminectomy.   L5-S1: Moderate bilateral foraminal stenosis and borderline left subarticular lateral recess stenosis due to disc osteophyte complex and facet arthropathy. Similar to prior.   IMPRESSION: 1. Status post left laminectomy at L3-L4 and L4-L5 with mild improvement in central stenosis at both levels. 2. Moderate to prominent central stenosis, moderate bilateral foraminal stenosis, and moderate right subarticular lateral recess stenosis at L4-L5. 3. Moderate left foraminal stenosis and moderate central stenosis at L3-L4. 4. Moderate bilateral foraminal stenosis at L5-S1.   Electronically signed by: Ryan Salvage MD 10/04/2024 04:29 PM EDT RP Workstation: [id number]   Thoracic MRI dated 10/01/24:  FINDINGS:   BONES AND ALIGNMENT: Normal alignment. Normal vertebral body heights. Bone marrow signal: Hemangioma centered to the right of T12 vertebral body. No abnormal enhancement. Mild Schmorl node along the endplate at T7, T8, and T9.   SPINAL CORD: Normal spinal cord volume. Normal spinal cord signal.   SOFT TISSUES: Unremarkable.   DEGENERATIVE CHANGES: Disc  desiccation at all levels between T5 and  T10. T1-T2: Unremarkable. T2-T3: Unremarkable. T3-T4: Unremarkable. T4-T5: Small central disc protrusion. No impingement. T5-T6: Unremarkable. T6-T7: Unremarkable. T7-T8: Central disc protrusion extending caudad causing moderate central stenosis. T8-T9: Minimal disc bulge. No impingement. T9-T10: Unremarkable. T10-T11: Unremarkable. T11-T12: Mild disc bulge. No impingement. T12-T1: Mild disc bulge. No impingement. No significant neural foraminal narrowing.   IMPRESSION: 1. Degenerative disc disease, with moderate central canal stenosis at T7-T8 due to central disc protrusion extending caudad.   Electronically signed by: Ryan Salvage MD 10/04/2024 04:41 PM EDT RP Workstation: [id number]   I have personally reviewed the images and agree with the above interpretation. Imaging reviewed with Dr. Clois as well.   Assessment and Plan: Stacey Pierce has intermittent LBP that is worse standing for over 5-10 minutes. She does not have pain with walking or sitting. No leg pain. No numbness, tingling, or weakness.   Reviewed above lumbar and thoracic MRI results with her.   She is likely still a candidate for percutaneous placement of SCS per Dr. Clois.   Treatment options reviewed with patient and following plan made:   - Dr. Clois recommends referral to pain management to for possible percutaneous SCS vs injections.  - She is not interested in seeing pain management at this time. Will let me know if this changes.  - She would like to follow up with Dr. Clois after the first of the year.  Advised to contact the office if any questions or concerns arise.  Glade Boys PA-C Department of neurosurgery

## 2024-10-10 ENCOUNTER — Ambulatory Visit: Admitting: Orthopedic Surgery

## 2024-10-10 ENCOUNTER — Encounter: Payer: Self-pay | Admitting: Orthopedic Surgery

## 2024-10-10 DIAGNOSIS — M545 Low back pain, unspecified: Secondary | ICD-10-CM

## 2024-10-10 DIAGNOSIS — M48062 Spinal stenosis, lumbar region with neurogenic claudication: Secondary | ICD-10-CM

## 2024-10-10 DIAGNOSIS — Z9889 Other specified postprocedural states: Secondary | ICD-10-CM

## 2024-10-10 DIAGNOSIS — M47816 Spondylosis without myelopathy or radiculopathy, lumbar region: Secondary | ICD-10-CM

## 2024-10-16 DIAGNOSIS — H26492 Other secondary cataract, left eye: Secondary | ICD-10-CM | POA: Diagnosis not present

## 2024-11-29 ENCOUNTER — Other Ambulatory Visit: Payer: Self-pay | Admitting: Neurosurgery

## 2024-12-09 ENCOUNTER — Ambulatory Visit: Admitting: Dermatology

## 2024-12-23 ENCOUNTER — Ambulatory Visit: Admitting: Neurosurgery

## 2024-12-30 ENCOUNTER — Ambulatory Visit: Admitting: Neurosurgery
